# Patient Record
Sex: Male | Born: 1940
Health system: Southern US, Community
[De-identification: ages and names within clinical notes are randomized; demographics above are authoritative.]

## PROBLEM LIST (undated history)

## (undated) DIAGNOSIS — R001 Bradycardia, unspecified: Secondary | ICD-10-CM

## (undated) DIAGNOSIS — I252 Old myocardial infarction: Secondary | ICD-10-CM

## (undated) DIAGNOSIS — Z5111 Encounter for antineoplastic chemotherapy: Secondary | ICD-10-CM

## (undated) DIAGNOSIS — I2510 Atherosclerotic heart disease of native coronary artery without angina pectoris: Secondary | ICD-10-CM

## (undated) DIAGNOSIS — Z789 Other specified health status: Secondary | ICD-10-CM

## (undated) DIAGNOSIS — I255 Ischemic cardiomyopathy: Secondary | ICD-10-CM

## (undated) DIAGNOSIS — I10 Essential (primary) hypertension: Secondary | ICD-10-CM

## (undated) DIAGNOSIS — K219 Gastro-esophageal reflux disease without esophagitis: Secondary | ICD-10-CM

## (undated) DIAGNOSIS — C801 Malignant (primary) neoplasm, unspecified: Secondary | ICD-10-CM

## (undated) DIAGNOSIS — C8590 Non-Hodgkin lymphoma, unspecified, unspecified site: Secondary | ICD-10-CM

## (undated) DIAGNOSIS — I219 Acute myocardial infarction, unspecified: Secondary | ICD-10-CM

## (undated) DIAGNOSIS — E785 Hyperlipidemia, unspecified: Secondary | ICD-10-CM

## (undated) HISTORY — DX: Old myocardial infarction: I25.2

## (undated) HISTORY — DX: Bradycardia, unspecified: R00.1

## (undated) HISTORY — PX: CORONARY STENT PLACEMENT: SHX1402

## (undated) HISTORY — DX: Encounter for antineoplastic chemotherapy: Z51.11

## (undated) HISTORY — DX: Essential (primary) hypertension: I10

## (undated) HISTORY — DX: Gastro-esophageal reflux disease without esophagitis: K21.9

## (undated) HISTORY — DX: Other specified health status: Z78.9

## (undated) HISTORY — DX: Ischemic cardiomyopathy: I25.5

## (undated) HISTORY — DX: Atherosclerotic heart disease of native coronary artery without angina pectoris: I25.10

## (undated) HISTORY — DX: Hyperlipidemia, unspecified: E78.5

## (undated) HISTORY — DX: Acute myocardial infarction, unspecified: I21.9

---

## 1998-08-19 ENCOUNTER — Other Ambulatory Visit: Admission: RE | Admit: 1998-08-19 | Discharge: 1998-08-19 | Payer: Self-pay | Admitting: Urology

## 1998-09-21 ENCOUNTER — Emergency Department (HOSPITAL_COMMUNITY): Admission: EM | Admit: 1998-09-21 | Discharge: 1998-09-21 | Payer: Self-pay | Admitting: Emergency Medicine

## 1998-09-21 ENCOUNTER — Encounter: Payer: Self-pay | Admitting: *Deleted

## 1998-10-28 ENCOUNTER — Other Ambulatory Visit: Admission: RE | Admit: 1998-10-28 | Discharge: 1998-10-28 | Payer: Self-pay | Admitting: Specialist

## 1999-09-08 ENCOUNTER — Encounter: Admission: RE | Admit: 1999-09-08 | Discharge: 1999-09-22 | Payer: Self-pay | Admitting: Orthopedic Surgery

## 2000-03-29 ENCOUNTER — Encounter (INDEPENDENT_AMBULATORY_CARE_PROVIDER_SITE_OTHER): Payer: Self-pay | Admitting: Specialist

## 2000-03-29 ENCOUNTER — Other Ambulatory Visit: Admission: RE | Admit: 2000-03-29 | Discharge: 2000-03-29 | Payer: Self-pay | Admitting: Urology

## 2001-06-29 ENCOUNTER — Emergency Department (HOSPITAL_COMMUNITY): Admission: EM | Admit: 2001-06-29 | Discharge: 2001-06-29 | Payer: Self-pay | Admitting: Emergency Medicine

## 2001-06-30 ENCOUNTER — Encounter: Payer: Self-pay | Admitting: Emergency Medicine

## 2008-01-19 ENCOUNTER — Ambulatory Visit (HOSPITAL_COMMUNITY): Admission: RE | Admit: 2008-01-19 | Discharge: 2008-01-19 | Payer: Self-pay | Admitting: Surgery

## 2008-01-19 ENCOUNTER — Encounter (INDEPENDENT_AMBULATORY_CARE_PROVIDER_SITE_OTHER): Payer: Self-pay | Admitting: Surgery

## 2008-01-27 ENCOUNTER — Ambulatory Visit: Payer: Self-pay | Admitting: Internal Medicine

## 2008-02-04 LAB — CBC WITH DIFFERENTIAL/PLATELET
BASO%: 0.3 % (ref 0.0–2.0)
Basophils Absolute: 0 10*3/uL (ref 0.0–0.1)
EOS%: 0.7 % (ref 0.0–7.0)
HCT: 36.5 % — ABNORMAL LOW (ref 38.7–49.9)
HGB: 12.4 g/dL — ABNORMAL LOW (ref 13.0–17.1)
LYMPH%: 57.7 % — ABNORMAL HIGH (ref 14.0–48.0)
MCH: 29.8 pg (ref 28.0–33.4)
MCHC: 33.9 g/dL (ref 32.0–35.9)
NEUT%: 35.9 % — ABNORMAL LOW (ref 40.0–75.0)
Platelets: 164 10*3/uL (ref 145–400)

## 2008-02-04 LAB — LACTATE DEHYDROGENASE: LDH: 225 U/L (ref 94–250)

## 2008-02-04 LAB — COMPREHENSIVE METABOLIC PANEL
ALT: 17 U/L (ref 0–53)
AST: 26 U/L (ref 0–37)
BUN: 15 mg/dL (ref 6–23)
CO2: 25 mEq/L (ref 19–32)
Calcium: 9.1 mg/dL (ref 8.4–10.5)
Creatinine, Ser: 1.1 mg/dL (ref 0.40–1.50)
Total Bilirubin: 0.5 mg/dL (ref 0.3–1.2)

## 2008-02-06 ENCOUNTER — Ambulatory Visit (HOSPITAL_COMMUNITY): Admission: RE | Admit: 2008-02-06 | Discharge: 2008-02-06 | Payer: Self-pay | Admitting: Internal Medicine

## 2008-02-11 ENCOUNTER — Ambulatory Visit (HOSPITAL_COMMUNITY): Admission: RE | Admit: 2008-02-11 | Discharge: 2008-02-11 | Payer: Self-pay | Admitting: Internal Medicine

## 2008-02-11 ENCOUNTER — Encounter: Payer: Self-pay | Admitting: Internal Medicine

## 2008-02-11 ENCOUNTER — Ambulatory Visit: Payer: Self-pay | Admitting: Internal Medicine

## 2008-02-16 ENCOUNTER — Ambulatory Visit (HOSPITAL_COMMUNITY): Admission: RE | Admit: 2008-02-16 | Discharge: 2008-02-16 | Payer: Self-pay | Admitting: Internal Medicine

## 2008-02-18 LAB — COMPREHENSIVE METABOLIC PANEL
Alkaline Phosphatase: 138 U/L — ABNORMAL HIGH (ref 39–117)
BUN: 14 mg/dL (ref 6–23)
Creatinine, Ser: 1.07 mg/dL (ref 0.40–1.50)
Glucose, Bld: 82 mg/dL (ref 70–99)
Sodium: 142 mEq/L (ref 135–145)
Total Bilirubin: 0.5 mg/dL (ref 0.3–1.2)
Total Protein: 6.6 g/dL (ref 6.0–8.3)

## 2008-02-18 LAB — CBC WITH DIFFERENTIAL/PLATELET
Eosinophils Absolute: 0 10*3/uL (ref 0.0–0.5)
LYMPH%: 49.9 % — ABNORMAL HIGH (ref 14.0–48.0)
MCV: 87.5 fL (ref 81.6–98.0)
MONO%: 7.8 % (ref 0.0–13.0)
NEUT#: 2.2 10*3/uL (ref 1.5–6.5)
NEUT%: 41.4 % (ref 40.0–75.0)
Platelets: 180 10*3/uL (ref 145–400)
RBC: 4 10*6/uL — ABNORMAL LOW (ref 4.20–5.71)

## 2008-03-01 LAB — LACTATE DEHYDROGENASE: LDH: 216 U/L (ref 94–250)

## 2008-03-01 LAB — COMPREHENSIVE METABOLIC PANEL
ALT: 21 U/L (ref 0–53)
Albumin: 4 g/dL (ref 3.5–5.2)
CO2: 23 mEq/L (ref 19–32)
Calcium: 8.3 mg/dL — ABNORMAL LOW (ref 8.4–10.5)
Chloride: 109 mEq/L (ref 96–112)
Glucose, Bld: 132 mg/dL — ABNORMAL HIGH (ref 70–99)
Potassium: 3.8 mEq/L (ref 3.5–5.3)
Sodium: 142 mEq/L (ref 135–145)
Total Protein: 6.4 g/dL (ref 6.0–8.3)

## 2008-03-01 LAB — CBC WITH DIFFERENTIAL/PLATELET
BASO%: 1 % (ref 0.0–2.0)
EOS%: 0.6 % (ref 0.0–7.0)
HCT: 39.3 % (ref 38.7–49.9)
LYMPH%: 42.8 % (ref 14.0–48.0)
MCH: 29.1 pg (ref 28.0–33.4)
MCHC: 34.6 g/dL (ref 32.0–35.9)
NEUT%: 43.9 % (ref 40.0–75.0)
Platelets: 175 10*3/uL (ref 145–400)
RBC: 4.66 10*6/uL (ref 4.20–5.71)
WBC: 5.9 10*3/uL (ref 4.0–10.0)
lymph#: 2.5 10*3/uL (ref 0.9–3.3)

## 2008-03-01 LAB — URIC ACID: Uric Acid, Serum: 7.4 mg/dL (ref 4.0–7.8)

## 2008-03-08 LAB — CBC WITH DIFFERENTIAL/PLATELET
BASO%: 0.3 % (ref 0.0–2.0)
EOS%: 1.7 % (ref 0.0–7.0)
HCT: 39.3 % (ref 38.7–49.9)
MCH: 29.5 pg (ref 28.0–33.4)
MCHC: 34.6 g/dL (ref 32.0–35.9)
MCV: 85.2 fL (ref 81.6–98.0)
MONO%: 0.3 % (ref 0.0–13.0)
NEUT%: 62.9 % (ref 40.0–75.0)
RDW: 14.4 % (ref 11.2–14.6)
lymph#: 0.9 10*3/uL (ref 0.9–3.3)

## 2008-03-08 LAB — COMPREHENSIVE METABOLIC PANEL
ALT: 12 U/L (ref 0–53)
AST: 10 U/L (ref 0–37)
Alkaline Phosphatase: 87 U/L (ref 39–117)
Calcium: 9 mg/dL (ref 8.4–10.5)
Chloride: 99 mEq/L (ref 96–112)
Creatinine, Ser: 0.87 mg/dL (ref 0.40–1.50)
Total Bilirubin: 0.8 mg/dL (ref 0.3–1.2)

## 2008-03-15 ENCOUNTER — Ambulatory Visit: Payer: Self-pay | Admitting: Internal Medicine

## 2008-03-15 LAB — LACTATE DEHYDROGENASE: LDH: 144 U/L (ref 94–250)

## 2008-03-15 LAB — CBC WITH DIFFERENTIAL/PLATELET
Basophils Absolute: 0 10*3/uL (ref 0.0–0.1)
Eosinophils Absolute: 0 10*3/uL (ref 0.0–0.5)
HCT: 40.4 % (ref 38.7–49.9)
HGB: 13.9 g/dL (ref 13.0–17.1)
LYMPH%: 79.7 % — ABNORMAL HIGH (ref 14.0–48.0)
MCV: 84.5 fL (ref 81.6–98.0)
MONO#: 0.2 10*3/uL (ref 0.1–0.9)
MONO%: 13.2 % — ABNORMAL HIGH (ref 0.0–13.0)
NEUT#: 0.1 10*3/uL — CL (ref 1.5–6.5)
NEUT%: 5.1 % — ABNORMAL LOW (ref 40.0–75.0)
Platelets: 210 10*3/uL (ref 145–400)
WBC: 1.2 10*3/uL — ABNORMAL LOW (ref 4.0–10.0)

## 2008-03-15 LAB — COMPREHENSIVE METABOLIC PANEL
Alkaline Phosphatase: 112 U/L (ref 39–117)
BUN: 21 mg/dL (ref 6–23)
CO2: 25 mEq/L (ref 19–32)
Glucose, Bld: 90 mg/dL (ref 70–99)
Total Bilirubin: 0.7 mg/dL (ref 0.3–1.2)
Total Protein: 7.1 g/dL (ref 6.0–8.3)

## 2008-03-15 LAB — URIC ACID: Uric Acid, Serum: 3.6 mg/dL — ABNORMAL LOW (ref 4.0–7.8)

## 2008-03-16 LAB — BASIC METABOLIC PANEL
CO2: 26 mEq/L (ref 19–32)
Calcium: 9.5 mg/dL (ref 8.4–10.5)
Chloride: 97 mEq/L (ref 96–112)
Creatinine, Ser: 1.42 mg/dL (ref 0.40–1.50)
Sodium: 135 mEq/L (ref 135–145)

## 2008-03-22 LAB — CBC WITH DIFFERENTIAL/PLATELET
Basophils Absolute: 0 10*3/uL (ref 0.0–0.1)
Eosinophils Absolute: 0 10*3/uL (ref 0.0–0.5)
HCT: 34.6 % — ABNORMAL LOW (ref 38.7–49.9)
HGB: 12.1 g/dL — ABNORMAL LOW (ref 13.0–17.1)
MCH: 29.3 pg (ref 28.0–33.4)
NEUT#: 3.3 10*3/uL (ref 1.5–6.5)
NEUT%: 66.9 % (ref 40.0–75.0)
RDW: 14.5 % (ref 11.2–14.6)
lymph#: 1.1 10*3/uL (ref 0.9–3.3)

## 2008-03-22 LAB — COMPREHENSIVE METABOLIC PANEL
Albumin: 3.9 g/dL (ref 3.5–5.2)
BUN: 10 mg/dL (ref 6–23)
CO2: 23 mEq/L (ref 19–32)
Calcium: 9 mg/dL (ref 8.4–10.5)
Chloride: 105 mEq/L (ref 96–112)
Creatinine, Ser: 0.95 mg/dL (ref 0.40–1.50)
Glucose, Bld: 72 mg/dL (ref 70–99)
Potassium: 4.4 mEq/L (ref 3.5–5.3)

## 2008-04-05 LAB — CBC WITH DIFFERENTIAL/PLATELET
Eosinophils Absolute: 0 10*3/uL (ref 0.0–0.5)
HCT: 33.5 % — ABNORMAL LOW (ref 38.7–49.9)
LYMPH%: 12.1 % — ABNORMAL LOW (ref 14.0–48.0)
MCHC: 34.4 g/dL (ref 32.0–35.9)
MCV: 84.3 fL (ref 81.6–98.0)
MONO%: 6 % (ref 0.0–13.0)
NEUT#: 4.2 10*3/uL (ref 1.5–6.5)
NEUT%: 81.7 % — ABNORMAL HIGH (ref 40.0–75.0)
Platelets: 591 10*3/uL — ABNORMAL HIGH (ref 145–400)
RBC: 3.97 10*6/uL — ABNORMAL LOW (ref 4.20–5.71)

## 2008-04-05 LAB — COMPREHENSIVE METABOLIC PANEL
Alkaline Phosphatase: 226 U/L — ABNORMAL HIGH (ref 39–117)
Creatinine, Ser: 0.94 mg/dL (ref 0.40–1.50)
Glucose, Bld: 91 mg/dL (ref 70–99)
Sodium: 136 mEq/L (ref 135–145)
Total Bilirubin: 0.3 mg/dL (ref 0.3–1.2)
Total Protein: 6.2 g/dL (ref 6.0–8.3)

## 2008-04-05 LAB — LACTATE DEHYDROGENASE: LDH: 123 U/L (ref 94–250)

## 2008-04-05 LAB — URIC ACID: Uric Acid, Serum: 5.3 mg/dL (ref 4.0–7.8)

## 2008-04-12 LAB — LACTATE DEHYDROGENASE: LDH: 158 U/L (ref 94–250)

## 2008-04-12 LAB — CBC WITH DIFFERENTIAL/PLATELET
Basophils Absolute: 0.1 10*3/uL (ref 0.0–0.1)
Eosinophils Absolute: 0 10*3/uL (ref 0.0–0.5)
HCT: 34.6 % — ABNORMAL LOW (ref 38.7–49.9)
HGB: 11.8 g/dL — ABNORMAL LOW (ref 13.0–17.1)
MONO#: 0.8 10*3/uL (ref 0.1–0.9)
NEUT#: 6.3 10*3/uL (ref 1.5–6.5)
NEUT%: 76.2 % — ABNORMAL HIGH (ref 40.0–75.0)
RDW: 15 % — ABNORMAL HIGH (ref 11.2–14.6)
WBC: 8.3 10*3/uL (ref 4.0–10.0)
lymph#: 1.2 10*3/uL (ref 0.9–3.3)

## 2008-04-12 LAB — COMPREHENSIVE METABOLIC PANEL
ALT: 16 U/L (ref 0–53)
AST: 18 U/L (ref 0–37)
CO2: 25 mEq/L (ref 19–32)
Chloride: 101 mEq/L (ref 96–112)
Sodium: 137 mEq/L (ref 135–145)
Total Bilirubin: 0.2 mg/dL — ABNORMAL LOW (ref 0.3–1.2)
Total Protein: 6.1 g/dL (ref 6.0–8.3)

## 2008-04-19 LAB — CBC WITH DIFFERENTIAL/PLATELET
Basophils Absolute: 0 10*3/uL (ref 0.0–0.1)
EOS%: 3 % (ref 0.0–7.0)
Eosinophils Absolute: 0 10*3/uL (ref 0.0–0.5)
HGB: 10.2 g/dL — ABNORMAL LOW (ref 13.0–17.1)
LYMPH%: 56.2 % — ABNORMAL HIGH (ref 14.0–48.0)
MCH: 29.1 pg (ref 28.0–33.4)
MCV: 85.1 fL (ref 81.6–98.0)
MONO%: 9.3 % (ref 0.0–13.0)
NEUT%: 31 % — ABNORMAL LOW (ref 40.0–75.0)
Platelets: 319 10*3/uL (ref 145–400)
RDW: 14.8 % — ABNORMAL HIGH (ref 11.2–14.6)

## 2008-04-19 LAB — COMPREHENSIVE METABOLIC PANEL
AST: 12 U/L (ref 0–37)
Alkaline Phosphatase: 131 U/L — ABNORMAL HIGH (ref 39–117)
BUN: 18 mg/dL (ref 6–23)
Creatinine, Ser: 0.74 mg/dL (ref 0.40–1.50)
Glucose, Bld: 123 mg/dL — ABNORMAL HIGH (ref 70–99)
Total Bilirubin: 0.6 mg/dL (ref 0.3–1.2)

## 2008-04-26 LAB — CBC WITH DIFFERENTIAL/PLATELET
BASO%: 0.3 % (ref 0.0–2.0)
HCT: 29.6 % — ABNORMAL LOW (ref 38.4–49.9)
LYMPH%: 11.4 % — ABNORMAL LOW (ref 14.0–49.0)
MCHC: 34.4 g/dL (ref 32.0–36.0)
MONO#: 0.5 10*3/uL (ref 0.1–0.9)
NEUT%: 80.5 % — ABNORMAL HIGH (ref 39.0–75.0)
Platelets: 307 10*3/uL (ref 140–400)
WBC: 6 10*3/uL (ref 4.0–10.3)

## 2008-04-26 LAB — COMPREHENSIVE METABOLIC PANEL
ALT: 10 U/L (ref 0–53)
CO2: 27 mEq/L (ref 19–32)
Creatinine, Ser: 0.83 mg/dL (ref 0.40–1.50)
Glucose, Bld: 85 mg/dL (ref 70–99)
Total Bilirubin: 0.3 mg/dL (ref 0.3–1.2)

## 2008-04-26 LAB — URIC ACID: Uric Acid, Serum: 3.6 mg/dL — ABNORMAL LOW (ref 4.0–7.8)

## 2008-04-26 LAB — LACTATE DEHYDROGENASE: LDH: 135 U/L (ref 94–250)

## 2008-04-28 ENCOUNTER — Ambulatory Visit (HOSPITAL_COMMUNITY): Admission: RE | Admit: 2008-04-28 | Discharge: 2008-04-28 | Payer: Self-pay | Admitting: Internal Medicine

## 2008-05-03 ENCOUNTER — Ambulatory Visit: Payer: Self-pay | Admitting: Internal Medicine

## 2008-05-03 LAB — CBC WITH DIFFERENTIAL/PLATELET
Basophils Absolute: 0 10*3/uL (ref 0.0–0.1)
EOS%: 0.1 % (ref 0.0–7.0)
HCT: 31.9 % — ABNORMAL LOW (ref 38.4–49.9)
HGB: 10.8 g/dL — ABNORMAL LOW (ref 13.0–17.1)
LYMPH%: 11.9 % — ABNORMAL LOW (ref 14.0–49.0)
MCH: 29.2 pg (ref 27.2–33.4)
MCV: 86 fL (ref 79.3–98.0)
MONO%: 6.3 % (ref 0.0–14.0)
NEUT%: 81.1 % — ABNORMAL HIGH (ref 39.0–75.0)
Platelets: 621 10*3/uL — ABNORMAL HIGH (ref 140–400)
RDW: 16.6 % — ABNORMAL HIGH (ref 11.0–14.6)

## 2008-05-03 LAB — COMPREHENSIVE METABOLIC PANEL
ALT: 18 U/L (ref 0–53)
AST: 18 U/L (ref 0–37)
BUN: 13 mg/dL (ref 6–23)
Calcium: 9 mg/dL (ref 8.4–10.5)
Creatinine, Ser: 0.8 mg/dL (ref 0.40–1.50)
Total Bilirubin: 0.2 mg/dL — ABNORMAL LOW (ref 0.3–1.2)

## 2008-05-10 LAB — CBC WITH DIFFERENTIAL/PLATELET
BASO%: 0.1 % (ref 0.0–2.0)
HCT: 29.5 % — ABNORMAL LOW (ref 38.4–49.9)
HGB: 9.9 g/dL — ABNORMAL LOW (ref 13.0–17.1)
LYMPH%: 9.5 % — ABNORMAL LOW (ref 14.0–49.0)
MCH: 29 pg (ref 27.2–33.4)
MCV: 86.4 fL (ref 79.3–98.0)
MONO#: 0 10*3/uL — ABNORMAL LOW (ref 0.1–0.9)
NEUT#: 4.6 10*3/uL (ref 1.5–6.5)
RDW: 17.6 % — ABNORMAL HIGH (ref 11.0–14.6)
lymph#: 0.5 10*3/uL — ABNORMAL LOW (ref 0.9–3.3)

## 2008-05-10 LAB — COMPREHENSIVE METABOLIC PANEL
ALT: 16 U/L (ref 0–53)
AST: 8 U/L (ref 0–37)
Alkaline Phosphatase: 75 U/L (ref 39–117)
CO2: 24 mEq/L (ref 19–32)
Sodium: 139 mEq/L (ref 135–145)
Total Bilirubin: 0.4 mg/dL (ref 0.3–1.2)
Total Protein: 5.3 g/dL — ABNORMAL LOW (ref 6.0–8.3)

## 2008-05-17 LAB — CBC WITH DIFFERENTIAL/PLATELET
Basophils Absolute: 0 10*3/uL (ref 0.0–0.1)
Eosinophils Absolute: 0 10*3/uL (ref 0.0–0.5)
HGB: 10.9 g/dL — ABNORMAL LOW (ref 13.0–17.1)
MCV: 87.6 fL (ref 79.3–98.0)
MONO#: 0.6 10*3/uL (ref 0.1–0.9)
MONO%: 36.4 % — ABNORMAL HIGH (ref 0.0–14.0)
NEUT#: 0.5 10*3/uL — ABNORMAL LOW (ref 1.5–6.5)
RDW: 18.5 % — ABNORMAL HIGH (ref 11.0–14.6)

## 2008-05-17 LAB — COMPREHENSIVE METABOLIC PANEL
Albumin: 3.6 g/dL (ref 3.5–5.2)
BUN: 12 mg/dL (ref 6–23)
CO2: 27 mEq/L (ref 19–32)
Calcium: 9.1 mg/dL (ref 8.4–10.5)
Chloride: 103 mEq/L (ref 96–112)
Glucose, Bld: 77 mg/dL (ref 70–99)
Potassium: 4.5 mEq/L (ref 3.5–5.3)

## 2008-05-17 LAB — LACTATE DEHYDROGENASE: LDH: 143 U/L (ref 94–250)

## 2008-05-17 LAB — URIC ACID: Uric Acid, Serum: 5.5 mg/dL (ref 4.0–7.8)

## 2008-05-24 LAB — COMPREHENSIVE METABOLIC PANEL
ALT: 12 U/L (ref 0–53)
Alkaline Phosphatase: 99 U/L (ref 39–117)
Sodium: 137 mEq/L (ref 135–145)
Total Bilirubin: 0.4 mg/dL (ref 0.3–1.2)
Total Protein: 6.6 g/dL (ref 6.0–8.3)

## 2008-05-24 LAB — CBC WITH DIFFERENTIAL/PLATELET
Eosinophils Absolute: 0.1 10*3/uL (ref 0.0–0.5)
MONO#: 1.1 10*3/uL — ABNORMAL HIGH (ref 0.1–0.9)
NEUT#: 2.4 10*3/uL (ref 1.5–6.5)
RBC: 4.41 10*6/uL (ref 4.20–5.82)
RDW: 17 % — ABNORMAL HIGH (ref 11.0–14.6)
WBC: 4.8 10*3/uL (ref 4.0–10.3)
nRBC: 0 % (ref 0–0)

## 2008-05-24 LAB — LACTATE DEHYDROGENASE: LDH: 180 U/L (ref 94–250)

## 2008-05-31 LAB — CBC WITH DIFFERENTIAL/PLATELET
BASO%: 0.2 % (ref 0.0–2.0)
Basophils Absolute: 0 10*3/uL (ref 0.0–0.1)
HCT: 31.4 % — ABNORMAL LOW (ref 38.4–49.9)
HGB: 10.5 g/dL — ABNORMAL LOW (ref 13.0–17.1)
MCHC: 33.4 g/dL (ref 32.0–36.0)
MONO#: 0.1 10*3/uL (ref 0.1–0.9)
NEUT%: 80.5 % — ABNORMAL HIGH (ref 39.0–75.0)
WBC: 4.7 10*3/uL (ref 4.0–10.3)
lymph#: 0.8 10*3/uL — ABNORMAL LOW (ref 0.9–3.3)

## 2008-05-31 LAB — COMPREHENSIVE METABOLIC PANEL
AST: 12 U/L (ref 0–37)
Albumin: 3.7 g/dL (ref 3.5–5.2)
BUN: 15 mg/dL (ref 6–23)
Chloride: 99 mEq/L (ref 96–112)
Potassium: 3.7 mEq/L (ref 3.5–5.3)
Total Protein: 5.9 g/dL — ABNORMAL LOW (ref 6.0–8.3)

## 2008-06-07 LAB — CBC WITH DIFFERENTIAL/PLATELET
Basophils Absolute: 0 10*3/uL (ref 0.0–0.1)
EOS%: 0.6 % (ref 0.0–7.0)
HCT: 29.8 % — ABNORMAL LOW (ref 38.4–49.9)
HGB: 10 g/dL — ABNORMAL LOW (ref 13.0–17.1)
LYMPH%: 36.6 % (ref 14.0–49.0)
MCH: 28.6 pg (ref 27.2–33.4)
MCV: 85.1 fL (ref 79.3–98.0)
MONO%: 48.4 % — ABNORMAL HIGH (ref 0.0–14.0)
NEUT%: 13.8 % — ABNORMAL LOW (ref 39.0–75.0)

## 2008-06-07 LAB — COMPREHENSIVE METABOLIC PANEL
Alkaline Phosphatase: 89 U/L (ref 39–117)
BUN: 10 mg/dL (ref 6–23)
Glucose, Bld: 81 mg/dL (ref 70–99)
Total Bilirubin: 0.3 mg/dL (ref 0.3–1.2)

## 2008-06-14 LAB — CBC WITH DIFFERENTIAL/PLATELET
BASO%: 0.3 % (ref 0.0–2.0)
LYMPH%: 32 % (ref 14.0–49.0)
MCHC: 32.6 g/dL (ref 32.0–36.0)
MONO#: 1.2 10*3/uL — ABNORMAL HIGH (ref 0.1–0.9)
RBC: 3.67 10*6/uL — ABNORMAL LOW (ref 4.20–5.82)
RDW: 17.3 % — ABNORMAL HIGH (ref 11.0–14.6)
WBC: 6.1 10*3/uL (ref 4.0–10.3)
lymph#: 2 10*3/uL (ref 0.9–3.3)

## 2008-06-14 LAB — COMPREHENSIVE METABOLIC PANEL
Albumin: 3.6 g/dL (ref 3.5–5.2)
Alkaline Phosphatase: 102 U/L (ref 39–117)
CO2: 27 mEq/L (ref 19–32)
Glucose, Bld: 109 mg/dL — ABNORMAL HIGH (ref 70–99)
Potassium: 4.4 mEq/L (ref 3.5–5.3)
Sodium: 139 mEq/L (ref 135–145)
Total Protein: 5.9 g/dL — ABNORMAL LOW (ref 6.0–8.3)

## 2008-06-14 LAB — LACTATE DEHYDROGENASE: LDH: 200 U/L (ref 94–250)

## 2008-06-14 LAB — URIC ACID: Uric Acid, Serum: 7.1 mg/dL (ref 4.0–7.8)

## 2008-06-17 ENCOUNTER — Ambulatory Visit: Payer: Self-pay | Admitting: Internal Medicine

## 2008-06-21 LAB — CBC WITH DIFFERENTIAL/PLATELET
BASO%: 1 % (ref 0.0–2.0)
HCT: 27.4 % — ABNORMAL LOW (ref 38.4–49.9)
HGB: 9.4 g/dL — ABNORMAL LOW (ref 13.0–17.1)
MCHC: 34.1 g/dL (ref 32.0–36.0)
MONO#: 0 10*3/uL — ABNORMAL LOW (ref 0.1–0.9)
NEUT%: 48 % (ref 39.0–75.0)
WBC: 0.9 10*3/uL — CL (ref 4.0–10.3)
lymph#: 0.4 10*3/uL — ABNORMAL LOW (ref 0.9–3.3)

## 2008-06-21 LAB — COMPREHENSIVE METABOLIC PANEL
ALT: 14 U/L (ref 0–53)
Albumin: 3.3 g/dL — ABNORMAL LOW (ref 3.5–5.2)
CO2: 28 mEq/L (ref 19–32)
Calcium: 8.3 mg/dL — ABNORMAL LOW (ref 8.4–10.5)
Chloride: 102 mEq/L (ref 96–112)
Creatinine, Ser: 0.81 mg/dL (ref 0.40–1.50)
Total Protein: 5.3 g/dL — ABNORMAL LOW (ref 6.0–8.3)

## 2008-06-21 LAB — URIC ACID: Uric Acid, Serum: 3.8 mg/dL — ABNORMAL LOW (ref 4.0–7.8)

## 2008-06-21 LAB — LACTATE DEHYDROGENASE: LDH: 131 U/L (ref 94–250)

## 2008-06-28 ENCOUNTER — Ambulatory Visit (HOSPITAL_COMMUNITY): Admission: RE | Admit: 2008-06-28 | Discharge: 2008-06-28 | Payer: Self-pay | Admitting: Internal Medicine

## 2008-06-28 LAB — LACTATE DEHYDROGENASE: LDH: 136 U/L (ref 94–250)

## 2008-06-28 LAB — COMPREHENSIVE METABOLIC PANEL
ALT: 11 U/L (ref 0–53)
AST: 11 U/L (ref 0–37)
CO2: 23 mEq/L (ref 19–32)
Sodium: 136 mEq/L (ref 135–145)
Total Bilirubin: 0.3 mg/dL (ref 0.3–1.2)
Total Protein: 6.1 g/dL (ref 6.0–8.3)

## 2008-06-28 LAB — CBC WITH DIFFERENTIAL/PLATELET
BASO%: 0.4 % (ref 0.0–2.0)
LYMPH%: 16.3 % (ref 14.0–49.0)
MCHC: 33.5 g/dL (ref 32.0–36.0)
MONO#: 0.4 10*3/uL (ref 0.1–0.9)
Platelets: 482 10*3/uL — ABNORMAL HIGH (ref 140–400)
RBC: 3.39 10*6/uL — ABNORMAL LOW (ref 4.20–5.82)
WBC: 5.7 10*3/uL (ref 4.0–10.3)
lymph#: 0.9 10*3/uL (ref 0.9–3.3)

## 2008-07-12 LAB — CBC WITH DIFFERENTIAL/PLATELET
Basophils Absolute: 0 10*3/uL (ref 0.0–0.1)
Eosinophils Absolute: 0.1 10*3/uL (ref 0.0–0.5)
HCT: 27.9 % — ABNORMAL LOW (ref 38.4–49.9)
HGB: 9.4 g/dL — ABNORMAL LOW (ref 13.0–17.1)
MCH: 29.1 pg (ref 27.2–33.4)
MCV: 86.4 fL (ref 79.3–98.0)
MONO%: 13.8 % (ref 0.0–14.0)
NEUT#: 0.5 10*3/uL — ABNORMAL LOW (ref 1.5–6.5)
NEUT%: 36.8 % — ABNORMAL LOW (ref 39.0–75.0)
RDW: 19.4 % — ABNORMAL HIGH (ref 11.0–14.6)

## 2008-07-12 LAB — COMPREHENSIVE METABOLIC PANEL
Albumin: 3.4 g/dL — ABNORMAL LOW (ref 3.5–5.2)
BUN: 13 mg/dL (ref 6–23)
CO2: 28 mEq/L (ref 19–32)
Glucose, Bld: 77 mg/dL (ref 70–99)
Potassium: 4 mEq/L (ref 3.5–5.3)
Sodium: 140 mEq/L (ref 135–145)
Total Bilirubin: 0.3 mg/dL (ref 0.3–1.2)
Total Protein: 5.5 g/dL — ABNORMAL LOW (ref 6.0–8.3)

## 2008-07-19 LAB — COMPREHENSIVE METABOLIC PANEL
Albumin: 3.3 g/dL — ABNORMAL LOW (ref 3.5–5.2)
Alkaline Phosphatase: 98 U/L (ref 39–117)
CO2: 26 mEq/L (ref 19–32)
Calcium: 9.1 mg/dL (ref 8.4–10.5)
Chloride: 104 mEq/L (ref 96–112)
Glucose, Bld: 85 mg/dL (ref 70–99)
Potassium: 4.3 mEq/L (ref 3.5–5.3)
Sodium: 141 mEq/L (ref 135–145)
Total Protein: 5.8 g/dL — ABNORMAL LOW (ref 6.0–8.3)

## 2008-07-19 LAB — CBC WITH DIFFERENTIAL/PLATELET
Eosinophils Absolute: 0 10*3/uL (ref 0.0–0.5)
HGB: 10 g/dL — ABNORMAL LOW (ref 13.0–17.1)
MONO#: 0.2 10*3/uL (ref 0.1–0.9)
NEUT#: 3 10*3/uL (ref 1.5–6.5)
RBC: 3.43 10*6/uL — ABNORMAL LOW (ref 4.20–5.82)
RDW: 19.4 % — ABNORMAL HIGH (ref 11.0–14.6)
WBC: 4 10*3/uL (ref 4.0–10.3)
lymph#: 0.8 10*3/uL — ABNORMAL LOW (ref 0.9–3.3)

## 2008-07-19 LAB — LACTATE DEHYDROGENASE: LDH: 132 U/L (ref 94–250)

## 2008-07-26 LAB — CBC WITH DIFFERENTIAL/PLATELET
BASO%: 0.6 % (ref 0.0–2.0)
LYMPH%: 16.6 % (ref 14.0–49.0)
MCHC: 32.6 g/dL (ref 32.0–36.0)
MONO#: 0.7 10*3/uL (ref 0.1–0.9)
Platelets: 481 10*3/uL — ABNORMAL HIGH (ref 140–400)
RBC: 3.97 10*6/uL — ABNORMAL LOW (ref 4.20–5.82)
WBC: 6.2 10*3/uL (ref 4.0–10.3)

## 2008-07-26 LAB — COMPREHENSIVE METABOLIC PANEL
ALT: <8 U/L (ref 0–53)
Alkaline Phosphatase: 99 U/L (ref 39–117)
CO2: 26 mEq/L (ref 19–32)
Sodium: 139 mEq/L (ref 135–145)
Total Bilirubin: 0.3 mg/dL (ref 0.3–1.2)
Total Protein: 6.6 g/dL (ref 6.0–8.3)

## 2008-07-30 ENCOUNTER — Ambulatory Visit: Payer: Self-pay | Admitting: Internal Medicine

## 2008-08-04 LAB — CBC WITH DIFFERENTIAL/PLATELET
BASO%: 1.1 % (ref 0.0–2.0)
Basophils Absolute: 0 10*3/uL (ref 0.0–0.1)
EOS%: 0.8 % (ref 0.0–7.0)
HCT: 32 % — ABNORMAL LOW (ref 38.4–49.9)
HGB: 10.5 g/dL — ABNORMAL LOW (ref 13.0–17.1)
LYMPH%: 23.4 % (ref 14.0–49.0)
MCH: 28.5 pg (ref 27.2–33.4)
MCHC: 32.8 g/dL (ref 32.0–36.0)
MCV: 86.7 fL (ref 79.3–98.0)
MONO%: 20.9 % — ABNORMAL HIGH (ref 0.0–14.0)
NEUT%: 53.8 % (ref 39.0–75.0)
Platelets: 189 10*3/uL (ref 140–400)

## 2008-08-04 LAB — COMPREHENSIVE METABOLIC PANEL
ALT: <8 U/L (ref 0–53)
AST: 12 U/L (ref 0–37)
Alkaline Phosphatase: 75 U/L (ref 39–117)
BUN: 10 mg/dL (ref 6–23)
Calcium: 9 mg/dL (ref 8.4–10.5)
Chloride: 107 mEq/L (ref 96–112)
Creatinine, Ser: 0.82 mg/dL (ref 0.40–1.50)
Total Bilirubin: 0.3 mg/dL (ref 0.3–1.2)

## 2008-09-24 ENCOUNTER — Ambulatory Visit: Payer: Self-pay | Admitting: Internal Medicine

## 2008-09-28 ENCOUNTER — Ambulatory Visit (HOSPITAL_COMMUNITY): Admission: RE | Admit: 2008-09-28 | Discharge: 2008-09-28 | Payer: Self-pay | Admitting: Internal Medicine

## 2008-09-28 LAB — LACTATE DEHYDROGENASE: LDH: 129 U/L (ref 94–250)

## 2008-09-28 LAB — CBC WITH DIFFERENTIAL/PLATELET
BASO%: 1.1 % (ref 0.0–2.0)
EOS%: 2.3 % (ref 0.0–7.0)
LYMPH%: 39.4 % (ref 14.0–49.0)
MCH: 29.2 pg (ref 27.2–33.4)
MCHC: 34 g/dL (ref 32.0–36.0)
MONO#: 0.6 10*3/uL (ref 0.1–0.9)
NEUT%: 41.4 % (ref 39.0–75.0)
RBC: 4.39 10*6/uL (ref 4.20–5.82)
WBC: 3.6 10*3/uL — ABNORMAL LOW (ref 4.0–10.3)
lymph#: 1.4 10*3/uL (ref 0.9–3.3)

## 2008-09-28 LAB — COMPREHENSIVE METABOLIC PANEL
ALT: 12 U/L (ref 0–53)
AST: 20 U/L (ref 0–37)
CO2: 29 mEq/L (ref 19–32)
Creatinine, Ser: 0.77 mg/dL (ref 0.40–1.50)
Sodium: 140 mEq/L (ref 135–145)
Total Bilirubin: 0.8 mg/dL (ref 0.3–1.2)
Total Protein: 6.1 g/dL (ref 6.0–8.3)

## 2008-12-27 ENCOUNTER — Ambulatory Visit: Payer: Self-pay | Admitting: Internal Medicine

## 2008-12-29 ENCOUNTER — Ambulatory Visit (HOSPITAL_COMMUNITY): Admission: RE | Admit: 2008-12-29 | Discharge: 2008-12-29 | Payer: Self-pay | Admitting: Internal Medicine

## 2008-12-29 LAB — CBC WITH DIFFERENTIAL/PLATELET
Basophils Absolute: 0 10*3/uL (ref 0.0–0.1)
EOS%: 3.9 % (ref 0.0–7.0)
HCT: 39 % (ref 38.4–49.9)
HGB: 13.2 g/dL (ref 13.0–17.1)
LYMPH%: 34.2 % (ref 14.0–49.0)
MCH: 31 pg (ref 27.2–33.4)
MCHC: 33.8 g/dL (ref 32.0–36.0)
MCV: 91.5 fL (ref 79.3–98.0)
MONO%: 12.6 % (ref 0.0–14.0)
NEUT%: 48.6 % (ref 39.0–75.0)

## 2008-12-29 LAB — COMPREHENSIVE METABOLIC PANEL
ALT: 13 U/L (ref 0–53)
Albumin: 3.5 g/dL (ref 3.5–5.2)
CO2: 27 mEq/L (ref 19–32)
Glucose, Bld: 84 mg/dL (ref 70–99)
Total Protein: 6 g/dL (ref 6.0–8.3)

## 2008-12-29 LAB — LACTATE DEHYDROGENASE: LDH: 124 U/L (ref 94–250)

## 2009-03-03 ENCOUNTER — Ambulatory Visit: Payer: Self-pay | Admitting: Internal Medicine

## 2009-03-29 LAB — COMPREHENSIVE METABOLIC PANEL
Alkaline Phosphatase: 100 U/L (ref 39–117)
Calcium: 9.8 mg/dL (ref 8.4–10.5)
Chloride: 103 mEq/L (ref 96–112)
Glucose, Bld: 82 mg/dL (ref 70–99)
Sodium: 141 mEq/L (ref 135–145)
Total Bilirubin: 0.5 mg/dL (ref 0.3–1.2)
Total Protein: 6.7 g/dL (ref 6.0–8.3)

## 2009-03-29 LAB — CBC WITH DIFFERENTIAL/PLATELET
Basophils Absolute: 0 10*3/uL (ref 0.0–0.1)
LYMPH%: 33.3 % (ref 14.0–49.0)
MCH: 31.5 pg (ref 27.2–33.4)
MCHC: 34.1 g/dL (ref 32.0–36.0)
MCV: 92.4 fL (ref 79.3–98.0)
MONO%: 9.5 % (ref 0.0–14.0)
NEUT#: 3 10*3/uL (ref 1.5–6.5)
NEUT%: 55 % (ref 39.0–75.0)
Platelets: 261 10*3/uL (ref 140–400)
lymph#: 1.8 10*3/uL (ref 0.9–3.3)

## 2009-04-08 ENCOUNTER — Ambulatory Visit: Payer: Self-pay | Admitting: Internal Medicine

## 2009-04-12 LAB — CBC WITH DIFFERENTIAL/PLATELET
BASO%: 1.7 % (ref 0.0–2.0)
Basophils Absolute: 0.1 10*3/uL (ref 0.0–0.1)
Eosinophils Absolute: 0.2 10*3/uL (ref 0.0–0.5)
HCT: 43.2 % (ref 38.4–49.9)
HGB: 15 g/dL (ref 13.0–17.1)
MONO#: 0.6 10*3/uL (ref 0.1–0.9)
NEUT#: 2.1 10*3/uL (ref 1.5–6.5)
NEUT%: 42.6 % (ref 39.0–75.0)
WBC: 4.8 10*3/uL (ref 4.0–10.3)
lymph#: 1.9 10*3/uL (ref 0.9–3.3)

## 2009-04-12 LAB — COMPREHENSIVE METABOLIC PANEL
Alkaline Phosphatase: 80 U/L (ref 39–117)
BUN: 15 mg/dL (ref 6–23)
Glucose, Bld: 94 mg/dL (ref 70–99)
Total Bilirubin: 0.4 mg/dL (ref 0.3–1.2)

## 2009-04-12 LAB — URIC ACID: Uric Acid, Serum: 6 mg/dL (ref 4.0–7.8)

## 2009-07-12 ENCOUNTER — Ambulatory Visit: Payer: Self-pay | Admitting: Internal Medicine

## 2009-07-13 LAB — COMPREHENSIVE METABOLIC PANEL
ALT: 16 U/L (ref 0–53)
Albumin: 4.2 g/dL (ref 3.5–5.2)
CO2: 22 mEq/L (ref 19–32)
Chloride: 103 mEq/L (ref 96–112)
Glucose, Bld: 81 mg/dL (ref 70–99)
Potassium: 4.6 mEq/L (ref 3.5–5.3)
Sodium: 139 mEq/L (ref 135–145)
Total Bilirubin: 0.5 mg/dL (ref 0.3–1.2)
Total Protein: 6.2 g/dL (ref 6.0–8.3)

## 2009-07-13 LAB — CBC WITH DIFFERENTIAL/PLATELET
BASO%: 1 % (ref 0.0–2.0)
Basophils Absolute: 0.1 10*3/uL (ref 0.0–0.1)
EOS%: 8.8 % — ABNORMAL HIGH (ref 0.0–7.0)
HCT: 42.4 % (ref 38.4–49.9)
HGB: 14.7 g/dL (ref 13.0–17.1)
MCH: 31 pg (ref 27.2–33.4)
MCHC: 34.7 g/dL (ref 32.0–36.0)
MCV: 89.5 fL (ref 79.3–98.0)
MONO%: 12 % (ref 0.0–14.0)
NEUT%: 34.5 % — ABNORMAL LOW (ref 39.0–75.0)
RDW: 13.2 % (ref 11.0–14.6)

## 2009-07-13 LAB — LACTATE DEHYDROGENASE: LDH: 195 U/L (ref 94–250)

## 2009-08-29 ENCOUNTER — Ambulatory Visit (HOSPITAL_COMMUNITY): Admission: RE | Admit: 2009-08-29 | Discharge: 2009-08-29 | Payer: Self-pay | Admitting: Internal Medicine

## 2009-09-02 ENCOUNTER — Ambulatory Visit: Payer: Self-pay | Admitting: Internal Medicine

## 2009-09-07 LAB — COMPREHENSIVE METABOLIC PANEL
Alkaline Phosphatase: 79 U/L (ref 39–117)
BUN: 18 mg/dL (ref 6–23)
CO2: 24 mEq/L (ref 19–32)
Creatinine, Ser: 0.99 mg/dL (ref 0.40–1.50)
Glucose, Bld: 79 mg/dL (ref 70–99)
Total Bilirubin: 0.5 mg/dL (ref 0.3–1.2)
Total Protein: 6.5 g/dL (ref 6.0–8.3)

## 2009-09-07 LAB — CBC WITH DIFFERENTIAL/PLATELET
BASO%: 0.5 % (ref 0.0–2.0)
Basophils Absolute: 0 10*3/uL (ref 0.0–0.1)
EOS%: 2.1 % (ref 0.0–7.0)
HCT: 43 % (ref 38.4–49.9)
HGB: 14.5 g/dL (ref 13.0–17.1)
MCH: 31.1 pg (ref 27.2–33.4)
MCHC: 33.8 g/dL (ref 32.0–36.0)
MONO#: 0.9 10*3/uL (ref 0.1–0.9)
RDW: 13.6 % (ref 11.0–14.6)
WBC: 6.8 10*3/uL (ref 4.0–10.3)
lymph#: 3.1 10*3/uL (ref 0.9–3.3)

## 2009-09-07 LAB — LACTATE DEHYDROGENASE: LDH: 135 U/L (ref 94–250)

## 2009-11-03 ENCOUNTER — Ambulatory Visit: Payer: Self-pay | Admitting: Internal Medicine

## 2009-11-08 LAB — CBC WITH DIFFERENTIAL/PLATELET
Basophils Absolute: 0 10*3/uL (ref 0.0–0.1)
Eosinophils Absolute: 0.1 10*3/uL (ref 0.0–0.5)
HCT: 42.7 % (ref 38.4–49.9)
HGB: 14.4 g/dL (ref 13.0–17.1)
LYMPH%: 33.2 % (ref 14.0–49.0)
MCV: 91.4 fL (ref 79.3–98.0)
MONO#: 0.5 10*3/uL (ref 0.1–0.9)
MONO%: 12 % (ref 0.0–14.0)
NEUT#: 2.1 10*3/uL (ref 1.5–6.5)
NEUT%: 51.2 % (ref 39.0–75.0)
Platelets: 229 10*3/uL (ref 140–400)
RBC: 4.67 10*6/uL (ref 4.20–5.82)
WBC: 4.2 10*3/uL (ref 4.0–10.3)
nRBC: 0 % (ref 0–0)

## 2009-11-08 LAB — COMPREHENSIVE METABOLIC PANEL
ALT: 19 U/L (ref 0–53)
BUN: 15 mg/dL (ref 6–23)
CO2: 25 mEq/L (ref 19–32)
Calcium: 9.3 mg/dL (ref 8.4–10.5)
Chloride: 103 mEq/L (ref 96–112)
Creatinine, Ser: 1.06 mg/dL (ref 0.40–1.50)
Glucose, Bld: 83 mg/dL (ref 70–99)
Total Bilirubin: 0.5 mg/dL (ref 0.3–1.2)

## 2009-11-08 LAB — LACTATE DEHYDROGENASE: LDH: 164 U/L (ref 94–250)

## 2010-01-05 ENCOUNTER — Ambulatory Visit: Payer: Self-pay | Admitting: Internal Medicine

## 2010-03-21 ENCOUNTER — Ambulatory Visit: Payer: Self-pay | Admitting: Internal Medicine

## 2010-03-23 ENCOUNTER — Ambulatory Visit (HOSPITAL_COMMUNITY)
Admission: RE | Admit: 2010-03-23 | Discharge: 2010-03-23 | Payer: Self-pay | Source: Home / Self Care | Attending: Internal Medicine | Admitting: Internal Medicine

## 2010-03-23 LAB — CBC WITH DIFFERENTIAL/PLATELET
BASO%: 0.4 % (ref 0.0–2.0)
Basophils Absolute: 0 10*3/uL (ref 0.0–0.1)
EOS%: 1.4 % (ref 0.0–7.0)
Eosinophils Absolute: 0.1 10*3/uL (ref 0.0–0.5)
HCT: 45.9 % (ref 38.4–49.9)
HGB: 15.5 g/dL (ref 13.0–17.1)
LYMPH%: 38 % (ref 14.0–49.0)
MCH: 30.3 pg (ref 27.2–33.4)
MCHC: 33.8 g/dL (ref 32.0–36.0)
MCV: 89.6 fL (ref 79.3–98.0)
MONO#: 0.8 10*3/uL (ref 0.1–0.9)
MONO%: 11.2 % (ref 0.0–14.0)
NEUT#: 3.5 10*3/uL (ref 1.5–6.5)
NEUT%: 49 % (ref 39.0–75.0)
Platelets: 235 10*3/uL (ref 140–400)
RBC: 5.12 10*6/uL (ref 4.20–5.82)
RDW: 13.2 % (ref 11.0–14.6)
WBC: 7.2 10*3/uL (ref 4.0–10.3)
lymph#: 2.7 10*3/uL (ref 0.9–3.3)

## 2010-03-23 LAB — CMP (CANCER CENTER ONLY)
ALT(SGPT): 20 U/L (ref 10–47)
AST: 25 U/L (ref 11–38)
Albumin: 3.8 g/dL (ref 3.3–5.5)
Alkaline Phosphatase: 78 U/L (ref 26–84)
BUN, Bld: 12 mg/dL (ref 7–22)
CO2: 26 mEq/L (ref 18–33)
Calcium: 9.1 mg/dL (ref 8.0–10.3)
Chloride: 99 mEq/L (ref 98–108)
Creat: 1 mg/dl (ref 0.6–1.2)
Glucose, Bld: 82 mg/dL (ref 73–118)
Potassium: 4.7 mEq/L (ref 3.3–4.7)
Sodium: 141 mEq/L (ref 128–145)
Total Bilirubin: 0.6 mg/dl (ref 0.20–1.60)
Total Protein: 6.8 g/dL (ref 6.4–8.1)

## 2010-03-23 LAB — LACTATE DEHYDROGENASE: LDH: 147 U/L (ref 94–250)

## 2010-03-27 LAB — CBC WITH DIFFERENTIAL/PLATELET
EOS%: 3.6 % (ref 0.0–7.0)
Eosinophils Absolute: 0.2 10*3/uL (ref 0.0–0.5)
HCT: 43.8 % (ref 38.4–49.9)
HGB: 15.1 g/dL (ref 13.0–17.1)
LYMPH%: 29.7 % (ref 14.0–49.0)
MCHC: 34.5 g/dL (ref 32.0–36.0)
MONO%: 11 % (ref 0.0–14.0)
NEUT#: 3.4 10*3/uL (ref 1.5–6.5)
RBC: 4.9 10*6/uL (ref 4.20–5.82)
nRBC: 0 % (ref 0–0)

## 2010-03-27 LAB — COMPREHENSIVE METABOLIC PANEL
Albumin: 4.5 g/dL (ref 3.5–5.2)
Alkaline Phosphatase: 80 U/L (ref 39–117)
BUN: 14 mg/dL (ref 6–23)
CO2: 24 mEq/L (ref 19–32)
Calcium: 9.5 mg/dL (ref 8.4–10.5)
Chloride: 103 mEq/L (ref 96–112)
Potassium: 4.2 mEq/L (ref 3.5–5.3)

## 2010-04-28 ENCOUNTER — Other Ambulatory Visit: Payer: Self-pay | Admitting: Family Medicine

## 2010-05-01 ENCOUNTER — Ambulatory Visit
Admission: RE | Admit: 2010-05-01 | Discharge: 2010-05-01 | Disposition: A | Discharge status: Home / Self Care | Payer: BC Managed Care – PPO | Source: Ambulatory Visit | Attending: Family Medicine | Admitting: Family Medicine

## 2010-05-29 ENCOUNTER — Encounter (HOSPITAL_BASED_OUTPATIENT_CLINIC_OR_DEPARTMENT_OTHER): Payer: BC Managed Care – PPO | Admitting: Internal Medicine

## 2010-05-29 ENCOUNTER — Other Ambulatory Visit: Payer: Self-pay | Admitting: Internal Medicine

## 2010-05-29 DIAGNOSIS — Z5112 Encounter for antineoplastic immunotherapy: Secondary | ICD-10-CM

## 2010-05-29 DIAGNOSIS — C8588 Other specified types of non-Hodgkin lymphoma, lymph nodes of multiple sites: Secondary | ICD-10-CM

## 2010-05-29 LAB — CBC WITH DIFFERENTIAL/PLATELET
BASO%: 0.5 % (ref 0.0–2.0)
Eosinophils Absolute: 0.3 10*3/uL (ref 0.0–0.5)
LYMPH%: 32.8 % (ref 14.0–49.0)
MCHC: 35 g/dL (ref 32.0–36.0)
MONO#: 0.6 10*3/uL (ref 0.1–0.9)
MONO%: 14 % (ref 0.0–14.0)
NEUT#: 1.9 10*3/uL (ref 1.5–6.5)
Platelets: 210 10*3/uL (ref 140–400)
RBC: 4.71 10*6/uL (ref 4.20–5.82)
RDW: 13 % (ref 11.0–14.6)
WBC: 4 10*3/uL (ref 4.0–10.3)

## 2010-05-29 LAB — COMPREHENSIVE METABOLIC PANEL
ALT: 11 U/L (ref 0–53)
Albumin: 4.3 g/dL (ref 3.5–5.2)
Alkaline Phosphatase: 82 U/L (ref 39–117)
CO2: 22 mEq/L (ref 19–32)
Potassium: 4.3 mEq/L (ref 3.5–5.3)
Sodium: 140 mEq/L (ref 135–145)
Total Bilirubin: 0.5 mg/dL (ref 0.3–1.2)
Total Protein: 6.5 g/dL (ref 6.0–8.3)

## 2010-07-18 NOTE — Op Note (Signed)
NAME:  Eric [name], WAH NO.:  [id number]   MEDICAL RECORD NO.:  [id number]          PATIENT TYPE:  AMB   LOCATION:  SDS                          FACILITY:  [facility]   PHYSICIAN:  Velora Heckler, MD      DATE OF BIRTH:  1941/01/03   DATE OF PROCEDURE:  01/19/2008  DATE OF DISCHARGE:  01/19/2008                               OPERATIVE REPORT   PREOPERATIVE DIAGNOSIS:  Lymphadenopathy.   POSTOPERATIVE DIAGNOSIS:  Lymphadenopathy.   PROCEDURE:  Right axillary lymph node excision.   SURGEON:  Velora Heckler, MD, FACS   ANESTHESIA:  General.   ESTIMATED BLOOD LOSS:  Minimal.   PREPARATION:  Betadine.   COMPLICATIONS:  None.   INDICATIONS:  The patient is a 70 year old male from [place], [place].  He presents at the request of Dr. Janace Hoard for  generalized lymphadenopathy.  This had developed over the past several  weeks.  On exam, the patient had bilateral cervical lymphadenopathy,  bilateral axillary lymphadenopathy, and left groin lymphadenopathy.  Concern is raised over possible lymphoma.  The patient now comes to  Surgery for lymph node excision for definitive diagnosis.   BODY OF REPORT:  Procedure was done in OR #70 at [facility].  The patient was brought to the operating room and placed in  supine position on the operating room table.  Following administration  of general anesthesia, the patient's right arm was positioned at 90  degrees to his side.  Axilla was prepped and draped in the usual strict  aseptic fashion.  After ascertaining that an adequate level of  anesthesia had been achieved, a low axillary incision was made with a  #10 blade.  Dissection was carried in subcutaneous tissues and  hemostasis was obtained with electrocautery.  Numerous enlarged soft  lymph nodes were present.  Approximately, 8-10 lymph nodes were excised  using the electrocautery and medium Ligaclips for hemostasis.  Specimen  was submitted  fresh to pathology for review.  Good hemostasis was noted  in the axilla.  Subcutaneous tissues were closed with interrupted 3-0  Vicryl sutures.  Skin was anesthetized with local anesthetic.  Skin was  closed with a running 4-0 Monocryl  subcuticular suture.  Wound was washed and dried and Benzoin and Steri-  Strips were applied.  Sterile dressings were applied.  The patient was  awakened from anesthesia and brought to the recovery room in stable  condition.  The patient tolerated the procedure well.      Velora Heckler, MD  Electronically Signed     TMG/MEDQ  D:  01/19/2008  T:  01/20/2008  Job:  [id number]   cc:   Peyton Najjar, MD

## 2010-11-15 ENCOUNTER — Encounter: Payer: Self-pay | Admitting: Gastroenterology

## 2010-11-24 ENCOUNTER — Other Ambulatory Visit: Payer: Self-pay | Admitting: Internal Medicine

## 2010-11-24 DIAGNOSIS — C8590 Non-Hodgkin lymphoma, unspecified, unspecified site: Secondary | ICD-10-CM

## 2010-11-28 ENCOUNTER — Encounter (HOSPITAL_BASED_OUTPATIENT_CLINIC_OR_DEPARTMENT_OTHER): Payer: BC Managed Care – PPO | Admitting: Internal Medicine

## 2010-11-28 ENCOUNTER — Other Ambulatory Visit: Payer: Self-pay | Admitting: Internal Medicine

## 2010-11-28 ENCOUNTER — Ambulatory Visit (HOSPITAL_COMMUNITY)
Admission: RE | Admit: 2010-11-28 | Discharge: 2010-11-28 | Disposition: A | Discharge status: Home / Self Care | Payer: BC Managed Care – PPO | Source: Ambulatory Visit | Attending: Internal Medicine | Admitting: Internal Medicine

## 2010-11-28 ENCOUNTER — Encounter (HOSPITAL_COMMUNITY): Payer: Self-pay

## 2010-11-28 DIAGNOSIS — Z5112 Encounter for antineoplastic immunotherapy: Secondary | ICD-10-CM

## 2010-11-28 DIAGNOSIS — I700 Atherosclerosis of aorta: Secondary | ICD-10-CM | POA: Insufficient documentation

## 2010-11-28 DIAGNOSIS — C8590 Non-Hodgkin lymphoma, unspecified, unspecified site: Secondary | ICD-10-CM

## 2010-11-28 DIAGNOSIS — C8588 Other specified types of non-Hodgkin lymphoma, lymph nodes of multiple sites: Secondary | ICD-10-CM

## 2010-11-28 DIAGNOSIS — C8583 Other specified types of non-Hodgkin lymphoma, intra-abdominal lymph nodes: Secondary | ICD-10-CM | POA: Insufficient documentation

## 2010-11-28 DIAGNOSIS — R599 Enlarged lymph nodes, unspecified: Secondary | ICD-10-CM | POA: Insufficient documentation

## 2010-11-28 LAB — COMPREHENSIVE METABOLIC PANEL
ALT: 12 U/L (ref 0–53)
AST: 19 U/L (ref 0–37)
Albumin: 3.9 g/dL (ref 3.5–5.2)
CO2: 29 mEq/L (ref 19–32)
Calcium: 9.5 mg/dL (ref 8.4–10.5)
Chloride: 102 mEq/L (ref 96–112)
Creatinine, Ser: 0.93 mg/dL (ref 0.50–1.35)
Potassium: 4.3 mEq/L (ref 3.5–5.3)
Total Protein: 6.6 g/dL (ref 6.0–8.3)

## 2010-11-28 LAB — CBC WITH DIFFERENTIAL/PLATELET
BASO%: 0.5 % (ref 0.0–2.0)
EOS%: 2 % (ref 0.0–7.0)
MCH: 30.9 pg (ref 27.2–33.4)
MCHC: 34.3 g/dL (ref 32.0–36.0)
MONO#: 0.7 10*3/uL (ref 0.1–0.9)
RDW: 13.3 % (ref 11.0–14.6)
WBC: 6 10*3/uL (ref 4.0–10.3)
lymph#: 1.8 10*3/uL (ref 0.9–3.3)

## 2010-11-28 MED ORDER — IOHEXOL 300 MG/ML  SOLN: 100.0000 mL | Freq: Once | INTRAMUSCULAR | Status: AC | PRN

## 2010-12-05 LAB — PROTIME-INR
INR: 1.1
Prothrombin Time: 14.3

## 2010-12-05 LAB — CBC
MCV: 89.2
Platelets: 186
RBC: 4.16 — ABNORMAL LOW
WBC: 6

## 2010-12-05 LAB — DIFFERENTIAL
Eosinophils Absolute: 0
Lymphocytes Relative: 57 — ABNORMAL HIGH
Lymphs Abs: 3.4
Monocytes Relative: 6
Neutro Abs: 2.1
Neutrophils Relative %: 36 — ABNORMAL LOW

## 2010-12-05 LAB — BASIC METABOLIC PANEL
BUN: 10
Calcium: 8.7
Chloride: 108
Creatinine, Ser: 1.01
GFR calc Af Amer: >60
GFR calc non Af Amer: >60

## 2010-12-07 ENCOUNTER — Other Ambulatory Visit: Payer: Self-pay | Admitting: Internal Medicine

## 2010-12-07 ENCOUNTER — Encounter (HOSPITAL_BASED_OUTPATIENT_CLINIC_OR_DEPARTMENT_OTHER): Payer: BC Managed Care – PPO | Admitting: Internal Medicine

## 2010-12-07 DIAGNOSIS — R5381 Other malaise: Secondary | ICD-10-CM

## 2010-12-07 DIAGNOSIS — R5383 Other fatigue: Secondary | ICD-10-CM

## 2010-12-07 DIAGNOSIS — Z5112 Encounter for antineoplastic immunotherapy: Secondary | ICD-10-CM

## 2010-12-07 DIAGNOSIS — C8588 Other specified types of non-Hodgkin lymphoma, lymph nodes of multiple sites: Secondary | ICD-10-CM

## 2010-12-07 LAB — LACTATE DEHYDROGENASE: LDH: 167 U/L (ref 94–250)

## 2010-12-08 LAB — DIFFERENTIAL
Basophils Absolute: 0 10*3/uL (ref 0.0–0.1)
Eosinophils Absolute: 0.1 10*3/uL (ref 0.0–0.7)
Eosinophils Relative: 1 % (ref 0–5)
Lymphocytes Relative: 54 % — ABNORMAL HIGH (ref 12–46)
Neutrophils Relative %: 38 % — ABNORMAL LOW (ref 43–77)

## 2010-12-08 LAB — GLUCOSE, CAPILLARY

## 2010-12-08 LAB — CBC
HCT: 36.3 % — ABNORMAL LOW (ref 39.0–52.0)
Platelets: 154 10*3/uL (ref 150–400)
RDW: 14.6 % (ref 11.5–15.5)
WBC: 4.5 10*3/uL (ref 4.0–10.5)

## 2010-12-08 LAB — CHROMOSOME ANALYSIS, BONE MARROW

## 2010-12-08 LAB — BONE MARROW EXAM

## 2010-12-11 ENCOUNTER — Encounter (HOSPITAL_BASED_OUTPATIENT_CLINIC_OR_DEPARTMENT_OTHER): Payer: BC Managed Care – PPO | Admitting: Internal Medicine

## 2010-12-11 ENCOUNTER — Other Ambulatory Visit: Payer: Self-pay | Admitting: Internal Medicine

## 2010-12-11 DIAGNOSIS — Z5112 Encounter for antineoplastic immunotherapy: Secondary | ICD-10-CM

## 2010-12-11 DIAGNOSIS — C8588 Other specified types of non-Hodgkin lymphoma, lymph nodes of multiple sites: Secondary | ICD-10-CM

## 2010-12-11 LAB — CBC WITH DIFFERENTIAL/PLATELET
BASO%: 0.4 % (ref 0.0–2.0)
EOS%: 4.7 % (ref 0.0–7.0)
MCH: 30.3 pg (ref 27.2–33.4)
MCV: 88 fL (ref 79.3–98.0)
MONO%: 14 % (ref 0.0–14.0)
NEUT#: 2.3 10*3/uL (ref 1.5–6.5)
RBC: 4.92 10*6/uL (ref 4.20–5.82)
RDW: 13.3 % (ref 11.0–14.6)
lymph#: 1.6 10*3/uL (ref 0.9–3.3)
nRBC: 0 % (ref 0–0)

## 2010-12-11 LAB — COMPREHENSIVE METABOLIC PANEL
ALT: 11 U/L (ref 0–53)
AST: 17 U/L (ref 0–37)
Alkaline Phosphatase: 70 U/L (ref 39–117)
Potassium: 4.2 mEq/L (ref 3.5–5.3)
Sodium: 141 mEq/L (ref 135–145)
Total Bilirubin: 0.3 mg/dL (ref 0.3–1.2)
Total Protein: 5.2 g/dL — ABNORMAL LOW (ref 6.0–8.3)

## 2011-02-09 ENCOUNTER — Ambulatory Visit: Payer: BC Managed Care – PPO

## 2011-02-14 ENCOUNTER — Other Ambulatory Visit: Payer: BC Managed Care – PPO | Admitting: Lab

## 2011-02-14 ENCOUNTER — Ambulatory Visit: Payer: BC Managed Care – PPO | Admitting: Physician Assistant

## 2011-05-25 ENCOUNTER — Telehealth: Payer: Self-pay | Admitting: Internal Medicine

## 2011-05-25 NOTE — Telephone Encounter (Signed)
Pt called today for appt w/MM. Per nurse appts for lb/fu/tx from dec 2014 were r/s to 3/28 @ 9 am. Per nurse appt scheduled w/MM. Pt given new appt for 3/28 @ 9 am.

## 2011-05-30 ENCOUNTER — Other Ambulatory Visit: Payer: Self-pay | Admitting: Medical Oncology

## 2011-05-30 DIAGNOSIS — C8590 Non-Hodgkin lymphoma, unspecified, unspecified site: Secondary | ICD-10-CM

## 2011-05-31 ENCOUNTER — Other Ambulatory Visit: Payer: Self-pay | Admitting: Emergency Medicine

## 2011-05-31 ENCOUNTER — Telehealth: Payer: Self-pay | Admitting: Internal Medicine

## 2011-05-31 ENCOUNTER — Other Ambulatory Visit (HOSPITAL_BASED_OUTPATIENT_CLINIC_OR_DEPARTMENT_OTHER): Payer: BC Managed Care – PPO | Admitting: Lab

## 2011-05-31 ENCOUNTER — Ambulatory Visit (HOSPITAL_BASED_OUTPATIENT_CLINIC_OR_DEPARTMENT_OTHER): Payer: BC Managed Care – PPO | Admitting: Internal Medicine

## 2011-05-31 VITALS — BP 146/88 | HR 71 | Temp 97.2°F | Ht 66.0 in | Wt 162.2 lb

## 2011-05-31 DIAGNOSIS — C8589 Other specified types of non-Hodgkin lymphoma, extranodal and solid organ sites: Secondary | ICD-10-CM

## 2011-05-31 DIAGNOSIS — C9190 Lymphoid leukemia, unspecified not having achieved remission: Secondary | ICD-10-CM

## 2011-05-31 DIAGNOSIS — C8590 Non-Hodgkin lymphoma, unspecified, unspecified site: Secondary | ICD-10-CM

## 2011-05-31 LAB — CBC WITH DIFFERENTIAL/PLATELET
BASO%: 0.8 % (ref 0.0–2.0)
HCT: 40.4 % (ref 38.4–49.9)
LYMPH%: 36.9 % (ref 14.0–49.0)
MCH: 30.2 pg (ref 27.2–33.4)
MCHC: 33.8 g/dL (ref 32.0–36.0)
MCV: 89.5 fL (ref 79.3–98.0)
MONO#: 0.8 10*3/uL (ref 0.1–0.9)
MONO%: 13.4 % (ref 0.0–14.0)
NEUT%: 45.1 % (ref 39.0–75.0)
Platelets: 189 10*3/uL (ref 140–400)
WBC: 6.3 10*3/uL (ref 4.0–10.3)

## 2011-05-31 LAB — COMPREHENSIVE METABOLIC PANEL
ALT: 15 U/L (ref 0–53)
CO2: 27 mEq/L (ref 19–32)
Creatinine, Ser: 1.02 mg/dL (ref 0.50–1.35)
Total Bilirubin: 0.6 mg/dL (ref 0.3–1.2)

## 2011-05-31 LAB — LACTATE DEHYDROGENASE: LDH: 224 U/L (ref 94–250)

## 2011-05-31 NOTE — Progress Notes (Signed)
[facility] [phone]   [phone]  OFFICE PROGRESS NOTE  GUEST, Eric Stapler, MD, MD [address] [place] [address]  PRINCIPAL DIAGNOSIS:  Bulky stage IV low grade lymphoma diagnosed in November 2009.  PRIOR THERAPY:   1. Status post 7 cycles of systemic chemotherapy with CHOP/Rituxan.  Last dose was given May 2010. 2. Status post 6 cycles of maintenance Rituxan 375 mg/sq m given every 2 months.  Last dose was given on May 29, 2010, and the patient was lost to followup at that time. He received another dose on 12/11/2010, then again missed 2 doses.  INTERVAL HISTORY: Eric [name] 71 y.o. male returns to the clinic today for followup visit. The patient Ms. the last 2 doses of his maintenance Rituxan because of  No-show. He attributed it to change in Job and loss of insurance. He noted palpable lymphadenopathy in the neck as well as axilla and inguinal area. He denied having any significant weight loss but he started having night sweats. He has no significant chest pain or shortness of breath.  ALLERGIES:  is allergic to neosporin.  MEDICATIONS:  Current Outpatient Prescriptions  Medication Sig Dispense Refill  . cyanocobalamin 1000 MCG tablet Take 100 mcg by mouth daily.      . ferrous sulfate 325 (65 FE) MG tablet Take 325 mg by mouth daily with breakfast.      . omeprazole (PRILOSEC) 40 MG capsule Take 40 mg by mouth daily.      Marland Kitchen zolpidem (AMBIEN) 5 MG tablet Take 10 mg by mouth at bedtime as needed.      . ranitidine (ZANTAC) 150 MG tablet Take 150 mg by mouth 2 (two) times daily. Alternates with omeperazole        REVIEW OF SYSTEMS:  A comprehensive review of systems was negative except for: Constitutional: positive for fatigue and night sweats Hematologic/lymphatic: positive for lymphadenopathy   PHYSICAL EXAMINATION: General appearance: alert, cooperative, fatigued and no distress Head: Normocephalic, without obvious  abnormality, atraumatic Neck: moderate anterior cervical adenopathy Lymph nodes: Cervical adenopathy: , Axillary adenopathy:  and Inguinal adenopathy:  Resp: clear to auscultation bilaterally Cardio: regular rate and rhythm, S1, S2 normal, no murmur, click, rub or gallop GI: soft, non-tender; bowel sounds normal; no masses,  no organomegaly Extremities: extremities normal, atraumatic, no cyanosis or edema Neurologic: Alert and oriented X 3, normal strength and tone. Normal symmetric reflexes. Normal coordination and gait  ECOG PERFORMANCE STATUS: 1 - Symptomatic but completely ambulatory  Blood pressure 146/88, pulse 71, temperature 97.2 F (36.2 C), temperature source Oral, height 5\' 6"  (1.676 m), weight 162 lb 3.2 oz (73.573 kg).  LABORATORY DATA: Lab Results  Component Value Date   WBC 6.3 05/31/2011   HGB 13.6 05/31/2011   HCT 40.4 05/31/2011   MCV 89.5 05/31/2011   PLT 189 05/31/2011      Chemistry      Component Value Date/Time   NA 141 12/11/2010 0936   NA 141 03/23/2010 1301   K 4.2 12/11/2010 0936   K 4.7 03/23/2010 1301   CL 108 12/11/2010 0936   CL 99 03/23/2010 1301   CO2 26 12/11/2010 0936   CO2 26 03/23/2010 1301   BUN 15 12/11/2010 0936   BUN 12 03/23/2010 1301   CREATININE 0.94 12/11/2010 0936   CREATININE 1.0 03/23/2010 1301      Component Value Date/Time   CALCIUM 8.7 12/11/2010 0936   CALCIUM 9.1 03/23/2010 1301  ALKPHOS 70 12/11/2010 0936   ALKPHOS 78 03/23/2010 1301   AST 17 12/11/2010 0936   AST 25 03/23/2010 1301   ALT 11 12/11/2010 0936   BILITOT 0.3 12/11/2010 0936   BILITOT 0.60 03/23/2010 1301       RADIOGRAPHIC STUDIES: No results found.  ASSESSMENT: This is a very pleasant 71 years old [place] American man with bulky stage IV low-grade lymphoma status post systemic chemotherapy with CHOP/Rituxan followed by 2 cycles of maintenance Rituxan that was discontinued secondary to noncompliance. The patient has actinic evidence for disease recurrence with palpable  lymphadenopathy in the neck, axilla and inguinal area  PLAN: I recommended for the patient to have repeat CT scan of the neck, chest, abdomen and pelvis for restaging of his disease. I would see him back for followup visit in one week for evaluation and discussion of his scan results and treatment options.   All questions were answered. The patient knows to call the clinic with any problems, questions or concerns. We can certainly see the patient much sooner if necessary.

## 2011-05-31 NOTE — Telephone Encounter (Signed)
appts made and printed for pt aom °

## 2011-06-01 ENCOUNTER — Ambulatory Visit: Payer: BC Managed Care – PPO

## 2011-06-02 ENCOUNTER — Other Ambulatory Visit: Payer: Self-pay | Admitting: Physician Assistant

## 2011-06-02 DIAGNOSIS — C8590 Non-Hodgkin lymphoma, unspecified, unspecified site: Secondary | ICD-10-CM

## 2011-06-02 MED ORDER — ZOLPIDEM TARTRATE 5 MG PO TABS: 10.0000 mg | ORAL_TABLET | Freq: Every evening | ORAL | Status: DC | PRN

## 2011-06-02 NOTE — Telephone Encounter (Signed)
RF done.  Eric [name]

## 2011-06-04 ENCOUNTER — Ambulatory Visit (HOSPITAL_COMMUNITY)
Admission: RE | Admit: 2011-06-04 | Discharge: 2011-06-04 | Disposition: A | Discharge status: Home / Self Care | Payer: Medicare Other | Source: Ambulatory Visit | Attending: Internal Medicine | Admitting: Internal Medicine

## 2011-06-04 DIAGNOSIS — N400 Enlarged prostate without lower urinary tract symptoms: Secondary | ICD-10-CM | POA: Insufficient documentation

## 2011-06-04 DIAGNOSIS — C8590 Non-Hodgkin lymphoma, unspecified, unspecified site: Secondary | ICD-10-CM

## 2011-06-04 DIAGNOSIS — R599 Enlarged lymph nodes, unspecified: Secondary | ICD-10-CM | POA: Insufficient documentation

## 2011-06-04 DIAGNOSIS — C8589 Other specified types of non-Hodgkin lymphoma, extranodal and solid organ sites: Secondary | ICD-10-CM | POA: Insufficient documentation

## 2011-06-04 DIAGNOSIS — K5730 Diverticulosis of large intestine without perforation or abscess without bleeding: Secondary | ICD-10-CM | POA: Insufficient documentation

## 2011-06-04 DIAGNOSIS — K449 Diaphragmatic hernia without obstruction or gangrene: Secondary | ICD-10-CM | POA: Insufficient documentation

## 2011-06-04 DIAGNOSIS — M47814 Spondylosis without myelopathy or radiculopathy, thoracic region: Secondary | ICD-10-CM | POA: Insufficient documentation

## 2011-06-04 DIAGNOSIS — N281 Cyst of kidney, acquired: Secondary | ICD-10-CM | POA: Insufficient documentation

## 2011-06-04 MED ORDER — IOHEXOL 300 MG/ML  SOLN
100.0000 mL | Freq: Once | INTRAMUSCULAR | Status: AC | PRN
  Administered 2011-06-04: 125 mL via INTRAVENOUS

## 2011-06-05 ENCOUNTER — Ambulatory Visit (HOSPITAL_BASED_OUTPATIENT_CLINIC_OR_DEPARTMENT_OTHER): Payer: BC Managed Care – PPO | Admitting: Internal Medicine

## 2011-06-05 VITALS — BP 150/83 | HR 67 | Temp 97.6°F | Ht 66.0 in | Wt 156.6 lb

## 2011-06-05 DIAGNOSIS — C8590 Non-Hodgkin lymphoma, unspecified, unspecified site: Secondary | ICD-10-CM

## 2011-06-05 DIAGNOSIS — C8589 Other specified types of non-Hodgkin lymphoma, extranodal and solid organ sites: Secondary | ICD-10-CM

## 2011-06-05 MED ORDER — ZOLPIDEM TARTRATE 5 MG PO TABS: 10.0000 mg | ORAL_TABLET | Freq: Every evening | ORAL | Status: DC | PRN

## 2011-06-05 NOTE — Progress Notes (Signed)
[facility] [phone]   [phone]  OFFICE PROGRESS NOTE  GUEST, Eric Stapler, MD, MD [address] [place] [address]  PRINCIPAL DIAGNOSIS: Bulky stage IV low grade lymphoma diagnosed in November 2009.   PRIOR THERAPY:  1. Status post 7 cycles of systemic chemotherapy with CHOP/Rituxan. Last dose was given May 2010. 2. Status post 6 cycles of maintenance Rituxan 375 mg/sq m given every 2 months. Last dose was given on May 29, 2010, and the patient was lost to followup at that time. He received another dose on 12/11/2010, then again missed 2 doses.  CURRENT THERAPY: The patient will start next week the first cycle of systemic chemotherapy with Rituxan 375 mg/M2 on day 1 and that bendamustine 90 mg/M2 on days 1 and 2 every 4 weeks.  INTERVAL HISTORY: Eric [name] 71 y.o. male returns to the clinic today for followup visit. He denied having any specific complaints today except for the palpable lymphadenopathy and fatigue. No significant weight loss or night sweats. He has no chest pain or shortness of breath. No cough or hemoptysis. The patient has repeat CT scan of the neck, chest, abdomen and pelvis performed recently and he is here today for evaluation and discussion of his scan results  ALLERGIES:  is allergic to neosporin.  MEDICATIONS:  Current Outpatient Prescriptions  Medication Sig Dispense Refill  . cyanocobalamin 1000 MCG tablet Take 100 mcg by mouth daily.      . ferrous sulfate 325 (65 FE) MG tablet Take 325 mg by mouth daily with breakfast.      . omeprazole (PRILOSEC) 40 MG capsule Take 40 mg by mouth daily.      . ranitidine (ZANTAC) 150 MG tablet Take 150 mg by mouth 2 (two) times daily. Alternates with omeperazole      . zolpidem (AMBIEN) 5 MG tablet Take 2 tablets (10 mg total) by mouth at bedtime as needed.  30 tablet  0  . DISCONTD: zolpidem (AMBIEN) 5 MG tablet Take 2 tablets (10 mg total) by mouth at bedtime as  needed.  30 tablet  0   No current facility-administered medications for this visit.   Facility-Administered Medications Ordered in Other Visits  Medication Dose Route Frequency Provider Last Rate Last Dose  . iohexol (OMNIPAQUE) 300 MG/ML solution 100 mL  100 mL Intravenous Once PRN Medication Radiologist, MD   125 mL at 06/04/11 1131    REVIEW OF SYSTEMS:  A comprehensive review of systems was negative except for: Constitutional: positive for fatigue   PHYSICAL EXAMINATION: General appearance: alert, cooperative and no distress Neck: palpable left cervical LNs Lymph nodes: Cervical adenopathy: , Axillary adenopathy: , Supraclavicular adenopathy:  and Inguinal adenopathy:  Resp: clear to auscultation bilaterally Cardio: regular rate and rhythm, S1, S2 normal, no murmur, click, rub or gallop GI: soft, non-tender; bowel sounds normal; no masses,  no organomegaly Extremities: extremities normal, atraumatic, no cyanosis or edema Neurologic: Alert and oriented X 3, normal strength and tone. Normal symmetric reflexes. Normal coordination and gait  ECOG PERFORMANCE STATUS: 1 - Symptomatic but completely ambulatory  Blood pressure 150/83, pulse 67, temperature 97.6 F (36.4 C), temperature source Oral, height 5\' 6"  (1.676 m), weight 156 lb 9.6 oz (71.033 kg).  LABORATORY DATA: Lab Results  Component Value Date   WBC 6.3 05/31/2011   HGB 13.6 05/31/2011   HCT 40.4 05/31/2011   MCV 89.5 05/31/2011   PLT 189 05/31/2011      Chemistry  Component Value Date/Time   NA 142 05/31/2011 0913   NA 141 03/23/2010 1301   K 3.8 05/31/2011 0913   K 4.7 03/23/2010 1301   CL 104 05/31/2011 0913   CL 99 03/23/2010 1301   CO2 27 05/31/2011 0913   CO2 26 03/23/2010 1301   BUN 11 05/31/2011 0913   BUN 12 03/23/2010 1301   CREATININE 1.02 05/31/2011 0913   CREATININE 1.0 03/23/2010 1301      Component Value Date/Time   CALCIUM 9.3 05/31/2011 0913   CALCIUM 9.1 03/23/2010 1301   ALKPHOS 84 05/31/2011 0913    ALKPHOS 78 03/23/2010 1301   AST 25 05/31/2011 0913   AST 25 03/23/2010 1301   ALT 15 05/31/2011 0913   BILITOT 0.6 05/31/2011 0913   BILITOT 0.60 03/23/2010 1301       RADIOGRAPHIC STUDIES: Ct Soft Tissue Neck W Contrast  06/04/2011  *RADIOLOGY REPORT*  Clinical Data:  Lymphoma restaging  CT NECK, CHEST, ABDOMEN AND PELVIS WITH CONTRAST  Technique:  Multidetector CT imaging of the neck, chest, abdomen and pelvis was performed using the standard protocol following the bolus administration of intravenous contrast.  Contrast: OMNIPAQUE IOHEXOL 300 MG/ML IJ SOLN  Comparison:  11/28/2010   CT NECK  Findings:  The visualized intracranial contents appear normal.  The parotid and submandibular glands are within normal limits.  Retention cyst versus polyp is noted within the right maxillary sinus.  The mastoid air cells appear clear.  There is mild multilevel spondylosis within the thoracic spine.  Bilateral cervical adenopathy is again identified.  Left posterior cervical node measures 1.5 cm, image 51.  Previously this measured 0.8 cm.  Right posterior cervical lymph node measures 1.3 cm, image 39. Previously this measured 0.8 cm.  No enhancing masses identified.  The thyroid gland appears normal.  Review of the visualized bony structures is significant for mild spondylosis.  IMPRESSION:  1.  Interval progression of bilateral cervical adenopathy   CT CHEST  Findings:  Progression of bilateral supraclavicular adenopathy.  Lymph node on the left measures 1 cm, image #2.  Previously this node measured 0.7 cm.  Progression of bulky bilateral axillary adenopathy.  Large left axillary lymph node measures 2.6 cm, image number 13.  Previously this measured 0.9 cm.  Right axillary lymph node measures 1.4 cm, image 12.  Previously this measured 1.1 cm.  Progression of mediastinal and bilateral hilar adenopathy.  For example, subcarinal lymph node measures 1.8 cm, image 30. Previously this measured 0.8 cm.  No  pericardial or pleural effusions.  The lungs appear clear.  There is no airspace consolidation.  No suspicious pulmonary parenchymal nodule or mass.  The visualized osseous structures are unremarkable.  No aggressive bone lesions noted.  IMPRESSION:  1.  Interval progression of mediastinal, axillary and supraclavicular adenopathy.   CT ABDOMEN AND PELVIS  Findings: The spleen appears normal.  Both of the adrenal glands are normal.  Normal appearance of the liver.  The gallbladder appears normal.  There is no biliary dilatation. Pancreas is normal.  Bilateral renal cysts are identified and appears similar to previous exam.  There has been significant progression of upper abdominal adenopathy.  Lymph node mass at the level of the left renal vein measures 9.1 x 5.5 cm, image number 64.  Previously this measured 5.7 x 2.9 cm.  At the level of the aortic bifurcation there is a right common iliac node which measures 3.5 cm, image 84.  Previously 1.7 cm.  Extensive  bilateral iliac adenopathy is present.  Left external iliac lymph node measures 3.6 cm, image 111.  Previously 1.6 cm.  There is marked enlargement of the prostate gland which has mass effect upon the bladder base.  The patient has a small hiatal hernia.  The stomach and small bowel loops have a normal course and caliber.  The appendix is identified and appears normal.  There are multiple sigmoid diverticula without acute inflammation.  Review of the visualized osseous structures is significant for mild spondylosis.  IMPRESSION:  1.  Significant progression of abdominal and pelvic adenopathy.  Original Report Authenticated By: Rosealee Albee, M.D.    ASSESSMENT: This is a very pleasant 71 years old American male with recurrent low-grade lymphoma. He was treated in the past with CHOP/Rituxan with significant response followed by maintenance Rituxan but he was not compliant with his treatment.  I discussed the scan results and showed the images to the  patient today.  PLAN: I recommended for him systemic chemotherapy with Rituxan 375 mg/M2 on day 1 and Bendamustine 90 mg/M2 on days 1 and 2 every 4 weeks.  I discussed with the patient adverse effect of this treatment including but not limited to alopecia, myelosuppression, peripheral neuropathy and, nausea and vomiting, liver or renal dysfunction. The patient agreed to the current plan. He is expected to start the first cycle of his treatment next week and he would come back for followup visit in 3 weeks for evaluation and management any adverse effect of his chemotherapy. He was advised to call immediately if he has any concerning symptoms in the interval. For insomnia, he was given a refill of Ambien 10 mg by mouth each bedtime on as needed basis.  All questions were answered. The patient knows to call the clinic with any problems, questions or concerns. We can certainly see the patient much sooner if necessary.  I spent 20 minutes counseling the patient face to face. The total time spent in the appointment was 35 minutes.

## 2011-06-06 ENCOUNTER — Telehealth: Payer: Self-pay | Admitting: Internal Medicine

## 2011-06-06 NOTE — Telephone Encounter (Signed)
Pt came in today to get schedule. tx and lbs have been added and pt was given schedule for April thru June.

## 2011-06-11 ENCOUNTER — Ambulatory Visit (HOSPITAL_BASED_OUTPATIENT_CLINIC_OR_DEPARTMENT_OTHER): Payer: BC Managed Care – PPO

## 2011-06-11 ENCOUNTER — Other Ambulatory Visit: Payer: BC Managed Care – PPO | Admitting: Lab

## 2011-06-11 VITALS — BP 139/72 | HR 74 | Temp 98.4°F

## 2011-06-11 DIAGNOSIS — C8590 Non-Hodgkin lymphoma, unspecified, unspecified site: Secondary | ICD-10-CM

## 2011-06-11 DIAGNOSIS — Z5111 Encounter for antineoplastic chemotherapy: Secondary | ICD-10-CM

## 2011-06-11 DIAGNOSIS — C8589 Other specified types of non-Hodgkin lymphoma, extranodal and solid organ sites: Secondary | ICD-10-CM

## 2011-06-11 DIAGNOSIS — Z5112 Encounter for antineoplastic immunotherapy: Secondary | ICD-10-CM

## 2011-06-11 LAB — CBC WITH DIFFERENTIAL/PLATELET
BASO%: 0.5 % (ref 0.0–2.0)
Eosinophils Absolute: 0.2 10*3/uL (ref 0.0–0.5)
HCT: 41.5 % (ref 38.4–49.9)
LYMPH%: 45.9 % (ref 14.0–49.0)
MCHC: 34.7 g/dL (ref 32.0–36.0)
MONO#: 0.8 10*3/uL (ref 0.1–0.9)
NEUT#: 2.2 10*3/uL (ref 1.5–6.5)
NEUT%: 37.1 % — ABNORMAL LOW (ref 39.0–75.0)
Platelets: 199 10*3/uL (ref 140–400)
RBC: 4.78 10*6/uL (ref 4.20–5.82)
WBC: 5.9 10*3/uL (ref 4.0–10.3)
lymph#: 2.7 10*3/uL (ref 0.9–3.3)
nRBC: 0 % (ref 0–0)

## 2011-06-11 LAB — COMPREHENSIVE METABOLIC PANEL
ALT: 11 U/L (ref 0–53)
CO2: 24 mEq/L (ref 19–32)
Calcium: 9.4 mg/dL (ref 8.4–10.5)
Chloride: 106 mEq/L (ref 96–112)
Creatinine, Ser: 0.94 mg/dL (ref 0.50–1.35)
Glucose, Bld: 85 mg/dL (ref 70–99)
Total Bilirubin: 0.5 mg/dL (ref 0.3–1.2)

## 2011-06-11 MED ORDER — ONDANSETRON 8 MG/50ML IVPB (CHCC)
8.0000 mg | Freq: Once | INTRAVENOUS | Status: AC
  Administered 2011-06-11: 8 mg via INTRAVENOUS

## 2011-06-11 MED ORDER — DIPHENHYDRAMINE HCL 25 MG PO CAPS
50.0000 mg | ORAL_CAPSULE | Freq: Once | ORAL | Status: AC
  Administered 2011-06-11: 50 mg via ORAL

## 2011-06-11 MED ORDER — SODIUM CHLORIDE 0.9 % IV SOLN
375.0000 mg/m2 | Freq: Once | INTRAVENOUS | Status: AC
  Administered 2011-06-11: 700 mg via INTRAVENOUS
  Filled 2011-06-11: qty 70

## 2011-06-11 MED ORDER — ACETAMINOPHEN 325 MG PO TABS
650.0000 mg | ORAL_TABLET | Freq: Once | ORAL | Status: AC
  Administered 2011-06-11: 650 mg via ORAL

## 2011-06-11 MED ORDER — BENDAMUSTINE HCL (LYOPHILIZED PWD) CHEMO INJECTION 100MG
90.0000 mg/m2 | Freq: Once | INTRAVENOUS | Status: AC
  Administered 2011-06-11: 165 mg via INTRAVENOUS
  Filled 2011-06-11: qty 33

## 2011-06-11 MED ORDER — SODIUM CHLORIDE 0.9 % IV SOLN
Freq: Once | INTRAVENOUS | Status: AC
  Administered 2011-06-11: 10:00:00 via INTRAVENOUS

## 2011-06-11 MED ORDER — DEXAMETHASONE SODIUM PHOSPHATE 10 MG/ML IJ SOLN
10.0000 mg | Freq: Once | INTRAMUSCULAR | Status: AC
  Administered 2011-06-11: 10 mg via INTRAVENOUS

## 2011-06-11 NOTE — Patient Instructions (Signed)
[facility] Discharge Instructions for Patients Receiving Chemotherapy  Today you received the following chemotherapy agents: Rituxan, Treanda.    If you develop nausea and vomiting that is not controlled by your nausea medication, call the clinic. If it is after clinic hours your family physician or the after hours number for the clinic or go to the Emergency Department.   BELOW ARE SYMPTOMS THAT SHOULD BE REPORTED IMMEDIATELY:  *FEVER GREATER THAN 100.5 F  *CHILLS WITH OR WITHOUT FEVER  NAUSEA AND VOMITING THAT IS NOT CONTROLLED WITH YOUR NAUSEA MEDICATION  *UNUSUAL SHORTNESS OF BREATH  *UNUSUAL BRUISING OR BLEEDING  TENDERNESS IN MOUTH AND THROAT WITH OR WITHOUT PRESENCE OF ULCERS  *URINARY PROBLEMS  *BOWEL PROBLEMS  UNUSUAL RASH Items with * indicate a potential emergency and should be followed up as soon as possible.  Feel free to call the clinic you have any questions or concerns. The clinic phone number is [phone].

## 2011-06-12 ENCOUNTER — Ambulatory Visit (HOSPITAL_BASED_OUTPATIENT_CLINIC_OR_DEPARTMENT_OTHER): Payer: BC Managed Care – PPO

## 2011-06-12 VITALS — BP 141/74 | HR 74 | Temp 97.5°F

## 2011-06-12 DIAGNOSIS — C8590 Non-Hodgkin lymphoma, unspecified, unspecified site: Secondary | ICD-10-CM

## 2011-06-12 DIAGNOSIS — Z5111 Encounter for antineoplastic chemotherapy: Secondary | ICD-10-CM

## 2011-06-12 DIAGNOSIS — C8589 Other specified types of non-Hodgkin lymphoma, extranodal and solid organ sites: Secondary | ICD-10-CM

## 2011-06-12 MED ORDER — ONDANSETRON 8 MG/50ML IVPB (CHCC)
8.0000 mg | Freq: Once | INTRAVENOUS | Status: AC
  Administered 2011-06-12: 8 mg via INTRAVENOUS

## 2011-06-12 MED ORDER — SODIUM CHLORIDE 0.9 % IV SOLN
90.0000 mg/m2 | Freq: Once | INTRAVENOUS | Status: AC
  Administered 2011-06-12: 165 mg via INTRAVENOUS
  Filled 2011-06-12: qty 33

## 2011-06-12 MED ORDER — DEXAMETHASONE SODIUM PHOSPHATE 10 MG/ML IJ SOLN
10.0000 mg | Freq: Once | INTRAMUSCULAR | Status: AC
  Administered 2011-06-12: 10 mg via INTRAVENOUS

## 2011-06-12 MED ORDER — SODIUM CHLORIDE 0.9 % IV SOLN
Freq: Once | INTRAVENOUS | Status: AC
  Administered 2011-06-12: 15:00:00 via INTRAVENOUS

## 2011-06-12 NOTE — Progress Notes (Signed)
Pt c/o hiccups that started yesterday evening and has been off and on today as well.  Per Dr Donnald Garre it is probably r/t the steroids, if they persist it is okay to give him rx for thorazine.  Pt verbalized understanding and will call if he has any further issues.  SLJ

## 2011-06-12 NOTE — Patient Instructions (Signed)
[facility] Discharge Instructions for Patients Receiving Chemotherapy  Today you received the following chemotherapy agents Treanda  To help prevent nausea and vomiting after your treatment, we encourage you to take your nausea medication    If you develop nausea and vomiting that is not controlled by your nausea medication, call the clinic. If it is after clinic hours your family physician or the after hours number for the clinic or go to the Emergency Department.   BELOW ARE SYMPTOMS THAT SHOULD BE REPORTED IMMEDIATELY:  *FEVER GREATER THAN 100.5 F  *CHILLS WITH OR WITHOUT FEVER  NAUSEA AND VOMITING THAT IS NOT CONTROLLED WITH YOUR NAUSEA MEDICATION  *UNUSUAL SHORTNESS OF BREATH  *UNUSUAL BRUISING OR BLEEDING  TENDERNESS IN MOUTH AND THROAT WITH OR WITHOUT PRESENCE OF ULCERS  *URINARY PROBLEMS  *BOWEL PROBLEMS  UNUSUAL RASH Items with * indicate a potential emergency and should be followed up as soon as possible.  . The clinic phone number is [phone].   I have been informed and understand all the instructions given to me. I know to contact the clinic, my physician, or go to the Emergency Department if any problems should occur. I do not have any questions at this time, but understand that I may call the clinic during office hours   should I have any questions or need assistance in obtaining follow up care.    __________________________________________  _____________  __________ Signature of Patient or Authorized Representative            Date                   Time    __________________________________________ Nurse's Signature

## 2011-06-18 ENCOUNTER — Other Ambulatory Visit (HOSPITAL_BASED_OUTPATIENT_CLINIC_OR_DEPARTMENT_OTHER): Payer: Medicare Other | Admitting: Lab

## 2011-06-18 DIAGNOSIS — C8590 Non-Hodgkin lymphoma, unspecified, unspecified site: Secondary | ICD-10-CM

## 2011-06-18 DIAGNOSIS — C8589 Other specified types of non-Hodgkin lymphoma, extranodal and solid organ sites: Secondary | ICD-10-CM

## 2011-06-18 LAB — COMPREHENSIVE METABOLIC PANEL
AST: 17 U/L (ref 0–37)
Albumin: 3.9 g/dL (ref 3.5–5.2)
Alkaline Phosphatase: 75 U/L (ref 39–117)
Calcium: 8.8 mg/dL (ref 8.4–10.5)
Chloride: 104 mEq/L (ref 96–112)
Potassium: 4.8 mEq/L (ref 3.5–5.3)
Sodium: 140 mEq/L (ref 135–145)
Total Protein: 5.9 g/dL — ABNORMAL LOW (ref 6.0–8.3)

## 2011-06-18 LAB — CBC WITH DIFFERENTIAL/PLATELET
BASO%: 0.4 % (ref 0.0–2.0)
EOS%: 4.4 % (ref 0.0–7.0)
MCH: 30.2 pg (ref 27.2–33.4)
MCHC: 33.5 g/dL (ref 32.0–36.0)
MCV: 90.2 fL (ref 79.3–98.0)
MONO%: 20.5 % — ABNORMAL HIGH (ref 0.0–14.0)
RBC: 4.49 10*6/uL (ref 4.20–5.82)
RDW: 14.1 % (ref 11.0–14.6)
lymph#: 0.4 10*3/uL — ABNORMAL LOW (ref 0.9–3.3)
nRBC: 0 % (ref 0–0)

## 2011-06-25 ENCOUNTER — Other Ambulatory Visit (HOSPITAL_BASED_OUTPATIENT_CLINIC_OR_DEPARTMENT_OTHER): Payer: Medicare Other | Admitting: Lab

## 2011-06-25 DIAGNOSIS — C8588 Other specified types of non-Hodgkin lymphoma, lymph nodes of multiple sites: Secondary | ICD-10-CM

## 2011-06-25 DIAGNOSIS — C8590 Non-Hodgkin lymphoma, unspecified, unspecified site: Secondary | ICD-10-CM

## 2011-06-25 LAB — CBC WITH DIFFERENTIAL/PLATELET
BASO%: 0.8 % (ref 0.0–2.0)
Eosinophils Absolute: 0.1 10*3/uL (ref 0.0–0.5)
HCT: 41 % (ref 38.4–49.9)
LYMPH%: 16.5 % (ref 14.0–49.0)
MCHC: 33.8 g/dL (ref 32.0–36.0)
MCV: 89.5 fL (ref 79.3–98.0)
MONO#: 1 10*3/uL — ABNORMAL HIGH (ref 0.1–0.9)
MONO%: 17.7 % — ABNORMAL HIGH (ref 0.0–14.0)
NEUT%: 62.4 % (ref 39.0–75.0)
Platelets: 250 10*3/uL (ref 140–400)
WBC: 5.5 10*3/uL (ref 4.0–10.3)

## 2011-06-25 LAB — COMPREHENSIVE METABOLIC PANEL
ALT: 12 U/L (ref 0–53)
Alkaline Phosphatase: 89 U/L (ref 39–117)
CO2: 31 mEq/L (ref 19–32)
Creatinine, Ser: 0.92 mg/dL (ref 0.50–1.35)
Sodium: 140 mEq/L (ref 135–145)
Total Bilirubin: 0.5 mg/dL (ref 0.3–1.2)

## 2011-06-25 LAB — LACTATE DEHYDROGENASE: LDH: 211 U/L (ref 94–250)

## 2011-06-26 ENCOUNTER — Other Ambulatory Visit (HOSPITAL_BASED_OUTPATIENT_CLINIC_OR_DEPARTMENT_OTHER): Payer: Medicare Other | Admitting: Lab

## 2011-06-26 ENCOUNTER — Telehealth: Payer: Self-pay | Admitting: Internal Medicine

## 2011-06-26 ENCOUNTER — Encounter: Payer: Self-pay | Admitting: Physician Assistant

## 2011-06-26 ENCOUNTER — Ambulatory Visit (HOSPITAL_BASED_OUTPATIENT_CLINIC_OR_DEPARTMENT_OTHER): Payer: Medicare Other | Admitting: Physician Assistant

## 2011-06-26 VITALS — BP 134/85 | HR 67 | Temp 97.2°F | Ht 66.0 in | Wt 151.0 lb

## 2011-06-26 DIAGNOSIS — C8589 Other specified types of non-Hodgkin lymphoma, extranodal and solid organ sites: Secondary | ICD-10-CM

## 2011-06-26 DIAGNOSIS — C8590 Non-Hodgkin lymphoma, unspecified, unspecified site: Secondary | ICD-10-CM

## 2011-06-26 DIAGNOSIS — G4700 Insomnia, unspecified: Secondary | ICD-10-CM

## 2011-06-26 LAB — COMPREHENSIVE METABOLIC PANEL
Albumin: 4.1 g/dL (ref 3.5–5.2)
BUN: 12 mg/dL (ref 6–23)
Calcium: 9.1 mg/dL (ref 8.4–10.5)
Chloride: 104 mEq/L (ref 96–112)
Glucose, Bld: 82 mg/dL (ref 70–99)
Potassium: 4.3 mEq/L (ref 3.5–5.3)

## 2011-06-26 LAB — CBC WITH DIFFERENTIAL/PLATELET
Basophils Absolute: 0.1 10*3/uL (ref 0.0–0.1)
Eosinophils Absolute: 0.2 10*3/uL (ref 0.0–0.5)
HGB: 13.8 g/dL (ref 13.0–17.1)
MONO#: 0.9 10*3/uL (ref 0.1–0.9)
NEUT#: 2.9 10*3/uL (ref 1.5–6.5)
RDW: 14.4 % (ref 11.0–14.6)
lymph#: 0.9 10*3/uL (ref 0.9–3.3)

## 2011-06-26 NOTE — Progress Notes (Signed)
[facility] Telephone:([phone]) [phone]   Fax:(336) [phone]  OFFICE PROGRESS NOTE  GUEST, [name], MD, MD [address] [place] [address]  PRINCIPAL DIAGNOSIS: Bulky stage IV low grade lymphoma diagnosed in November 2009.   PRIOR THERAPY:  1. Status post 7 cycles of systemic chemotherapy with CHOP/Rituxan. Last dose was given May 2010. 2. Status post 6 cycles of maintenance Rituxan 375 mg/sq m given every 2 months. Last dose was given on May 29, 2010, and the patient was lost to followup at that time. He received another dose on 12/11/2010, then again missed 2 doses.  CURRENT THERAPY:  systemic chemotherapy with Rituxan 375 mg/M2 on day 1 and that bendamustine 90 mg/M2 on days 1 and 2 every 4 weeks, status post 1 cycle  INTERVAL HISTORY: Eric [name] 71 y.o. male returns to the clinic today for followup visit. He tolerated his first cycle without any significant difficulty. He did notice increased urination for the first few days after chemotherapy. He reports that he drinks a lot of water and that his urine was clear and had no foul odor. He still has difficulty sleeping despite the Ambien. He reports difficulty falling asleep as well as staying asleep and also reports that this has been a long-standing problem. He denied any recent night sweats no weight loss, denied chest pain shortness of breath no cough or hemoptysis.   ALLERGIES:  is allergic to neosporin.  MEDICATIONS:  Current Outpatient Prescriptions  Medication Sig Dispense Refill  . cyanocobalamin 1000 MCG tablet Take 100 mcg by mouth daily.      . ferrous sulfate 325 (65 FE) MG tablet Take 325 mg by mouth daily with breakfast.      . omeprazole (PRILOSEC) 40 MG capsule Take 40 mg by mouth daily.      . ranitidine (ZANTAC) 150 MG tablet Take 150 mg by mouth 2 (two) times daily. Alternates with omeperazole      . zolpidem (AMBIEN) 5 MG tablet Take 2 tablets (10 mg total) by mouth at bedtime as  needed.  30 tablet  0    REVIEW OF SYSTEMS:  A comprehensive review of systems was negative except for: Constitutional: positive for fatigue   PHYSICAL EXAMINATION: General appearance: alert, cooperative and no distress Neck: palpable left cervical LNs Lymph nodes: Cervical adenopathy: , Axillary adenopathy: , Supraclavicular adenopathy:  and Inguinal adenopathy:  Resp: clear to auscultation bilaterally Cardio: regular rate and rhythm, S1, S2 normal, no murmur, click, rub or gallop GI: soft, non-tender; bowel sounds normal; no masses,  no organomegaly Extremities: extremities normal, atraumatic, no cyanosis or edema Neurologic: Alert and oriented X 3, normal strength and tone. Normal symmetric reflexes. Normal coordination and gait  ECOG PERFORMANCE STATUS: 1 - Symptomatic but completely ambulatory  Blood pressure 134/85, pulse 67, temperature 97.2 F (36.2 C), temperature source Oral, height 5\' 6"  (1.676 m), weight 151 lb (68.493 kg).  LABORATORY DATA: Lab Results  Component Value Date   WBC 5.0 06/26/2011   HGB 13.8 06/26/2011   HCT 40.9 06/26/2011   MCV 89.5 06/26/2011   PLT 234 06/26/2011      Chemistry      Component Value Date/Time   NA 140 06/25/2011 1405   NA 141 03/23/2010 1301   K 4.4 06/25/2011 1405   K 4.7 03/23/2010 1301   CL 104 06/25/2011 1405   CL 99 03/23/2010 1301   CO2 31 06/25/2011 1405   CO2 26 03/23/2010 1301  BUN 13 06/25/2011 1405   BUN 12 03/23/2010 1301   CREATININE 0.92 06/25/2011 1405   CREATININE 1.0 03/23/2010 1301      Component Value Date/Time   CALCIUM 9.4 06/25/2011 1405   CALCIUM 9.1 03/23/2010 1301   ALKPHOS 89 06/25/2011 1405   ALKPHOS 78 03/23/2010 1301   AST 19 06/25/2011 1405   AST 25 03/23/2010 1301   ALT 12 06/25/2011 1405   BILITOT 0.5 06/25/2011 1405   BILITOT 0.60 03/23/2010 1301       RADIOGRAPHIC STUDIES: Ct Soft Tissue Neck W Contrast  06/04/2011  *RADIOLOGY REPORT*  Clinical Data:  Lymphoma restaging  CT NECK, CHEST, ABDOMEN AND  PELVIS WITH CONTRAST  Technique:  Multidetector CT imaging of the neck, chest, abdomen and pelvis was performed using the standard protocol following the bolus administration of intravenous contrast.  Contrast: OMNIPAQUE IOHEXOL 300 MG/ML IJ SOLN  Comparison:  11/28/2010   CT NECK  Findings:  The visualized intracranial contents appear normal.  The parotid and submandibular glands are within normal limits.  Retention cyst versus polyp is noted within the right maxillary sinus.  The mastoid air cells appear clear.  There is mild multilevel spondylosis within the thoracic spine.  Bilateral cervical adenopathy is again identified.  Left posterior cervical node measures 1.5 cm, image 51.  Previously this measured 0.8 cm.  Right posterior cervical lymph node measures 1.3 cm, image 39. Previously this measured 0.8 cm.  No enhancing masses identified.  The thyroid gland appears normal.  Review of the visualized bony structures is significant for mild spondylosis.  IMPRESSION:  1.  Interval progression of bilateral cervical adenopathy   CT CHEST  Findings:  Progression of bilateral supraclavicular adenopathy.  Lymph node on the left measures 1 cm, image #2.  Previously this node measured 0.7 cm.  Progression of bulky bilateral axillary adenopathy.  Large left axillary lymph node measures 2.6 cm, image number 13.  Previously this measured 0.9 cm.  Right axillary lymph node measures 1.4 cm, image 12.  Previously this measured 1.1 cm.  Progression of mediastinal and bilateral hilar adenopathy.  For example, subcarinal lymph node measures 1.8 cm, image 30. Previously this measured 0.8 cm.  No pericardial or pleural effusions.  The lungs appear clear.  There is no airspace consolidation.  No suspicious pulmonary parenchymal nodule or mass.  The visualized osseous structures are unremarkable.  No aggressive bone lesions noted.  IMPRESSION:  1.  Interval progression of mediastinal, axillary and supraclavicular adenopathy.    CT ABDOMEN AND PELVIS  Findings: The spleen appears normal.  Both of the adrenal glands are normal.  Normal appearance of the liver.  The gallbladder appears normal.  There is no biliary dilatation. Pancreas is normal.  Bilateral renal cysts are identified and appears similar to previous exam.  There has been significant progression of upper abdominal adenopathy.  Lymph node mass at the level of the left renal vein measures 9.1 x 5.5 cm, image number 64.  Previously this measured 5.7 x 2.9 cm.  At the level of the aortic bifurcation there is a right common iliac node which measures 3.5 cm, image 84.  Previously 1.7 cm.  Extensive bilateral iliac adenopathy is present.  Left external iliac lymph node measures 3.6 cm, image 111.  Previously 1.6 cm.  There is marked enlargement of the prostate gland which has mass effect upon the bladder base.  The patient has a small hiatal hernia.  The stomach and small bowel loops have  a normal course and caliber.  The appendix is identified and appears normal.  There are multiple sigmoid diverticula without acute inflammation.  Review of the visualized osseous structures is significant for mild spondylosis.  IMPRESSION:  1.  Significant progression of abdominal and pelvic adenopathy.  Original Report Authenticated By: Rosealee Albee, M.D.    ASSESSMENT: This is a very pleasant 71 years old American male with recurrent low-grade lymphoma. He was treated in the past with CHOP/Rituxan with significant response followed by maintenance Rituxan but he was not compliant with his treatment. His most recent restaging CT scan revealed progression of lymphadenopathy. He is currently being treated with systemic chemotherapy in the form of Rituxan at 375 mg per meter squared on day 1 and Bendamustine 90 mg per meter square given on days 1-2 every 4 weeks, status post 1 cycle. Overall he is tolerated this therapy without significant difficulty. Patient was discussed with Dr. Arbutus Ped.  He'll return on 07/05/2010 for a repeat office visit with CBC differential C. met and LDH and proceed with cycle #2 of his systemic chemotherapy with Rituxan and bendamustine as scheduled on 07/09/2011. He'll continue with Ambien for his insomnia on a when necessary basis.   Eric [name], Eric Ruta E, PA-C   All questions were answered. The patient knows to call the clinic with any problems, questions or concerns. We can certainly see the patient much sooner if necessary.

## 2011-06-26 NOTE — Telephone Encounter (Signed)
Gv pt appt for april-may2013 

## 2011-07-02 ENCOUNTER — Other Ambulatory Visit (HOSPITAL_BASED_OUTPATIENT_CLINIC_OR_DEPARTMENT_OTHER): Payer: Medicare Other | Admitting: Lab

## 2011-07-02 DIAGNOSIS — C8589 Other specified types of non-Hodgkin lymphoma, extranodal and solid organ sites: Secondary | ICD-10-CM

## 2011-07-02 DIAGNOSIS — C8590 Non-Hodgkin lymphoma, unspecified, unspecified site: Secondary | ICD-10-CM

## 2011-07-02 LAB — CBC WITH DIFFERENTIAL/PLATELET
Eosinophils Absolute: 0.2 10*3/uL (ref 0.0–0.5)
HCT: 39.4 % (ref 38.4–49.9)
LYMPH%: 23.7 % (ref 14.0–49.0)
MONO#: 0.9 10*3/uL (ref 0.1–0.9)
NEUT#: 2.2 10*3/uL (ref 1.5–6.5)
Platelets: 189 10*3/uL (ref 140–400)
RBC: 4.39 10*6/uL (ref 4.20–5.82)
WBC: 4.5 10*3/uL (ref 4.0–10.3)
nRBC: 0 % (ref 0–0)

## 2011-07-02 LAB — COMPREHENSIVE METABOLIC PANEL
ALT: 13 U/L (ref 0–53)
Albumin: 4.2 g/dL (ref 3.5–5.2)
CO2: 26 mEq/L (ref 19–32)
Calcium: 9.1 mg/dL (ref 8.4–10.5)
Chloride: 105 mEq/L (ref 96–112)
Creatinine, Ser: 1.15 mg/dL (ref 0.50–1.35)
Total Protein: 5.8 g/dL — ABNORMAL LOW (ref 6.0–8.3)

## 2011-07-02 LAB — LACTATE DEHYDROGENASE: LDH: 179 U/L (ref 94–250)

## 2011-07-05 ENCOUNTER — Ambulatory Visit: Payer: Medicare Other | Admitting: Physician Assistant

## 2011-07-05 ENCOUNTER — Encounter: Payer: Self-pay | Admitting: Physician Assistant

## 2011-07-05 ENCOUNTER — Telehealth: Payer: Self-pay | Admitting: Internal Medicine

## 2011-07-05 ENCOUNTER — Ambulatory Visit (HOSPITAL_BASED_OUTPATIENT_CLINIC_OR_DEPARTMENT_OTHER): Payer: Medicare Other | Admitting: Physician Assistant

## 2011-07-05 ENCOUNTER — Other Ambulatory Visit (HOSPITAL_BASED_OUTPATIENT_CLINIC_OR_DEPARTMENT_OTHER): Payer: Medicare Other | Admitting: Lab

## 2011-07-05 VITALS — BP 133/79 | HR 72 | Temp 97.7°F | Ht 66.0 in | Wt 151.5 lb

## 2011-07-05 DIAGNOSIS — C8588 Other specified types of non-Hodgkin lymphoma, lymph nodes of multiple sites: Secondary | ICD-10-CM

## 2011-07-05 NOTE — Telephone Encounter (Signed)
gv pt appt for may and june2013

## 2011-07-06 NOTE — Progress Notes (Signed)
[facility] Telephone:([phone]   Fax:([phone]  OFFICE PROGRESS NOTE  GUEST, [name], MD, MD [address] [place] [address]  PRINCIPAL DIAGNOSIS: Bulky stage IV low grade lymphoma diagnosed in November 2009.   PRIOR THERAPY:  1. Status post 7 cycles of systemic chemotherapy with CHOP/Rituxan. Last dose was given May 2010. 2. Status post 6 cycles of maintenance Rituxan 375 mg/sq m given every 2 months. Last dose was given on May 29, 2010, and the patient was lost to followup at that time. He received another dose on 12/11/2010, then again missed 2 doses.  CURRENT THERAPY:  systemic chemotherapy with Rituxan 375 mg/M2 on day 1 and that bendamustine 90 mg/M2 on days 1 and 2 every 4 weeks, status post 1 cycle  INTERVAL HISTORY: Greogory Cornette 71 y.o. male returns to the clinic today for followup visit. He tolerated his first cycle without any significant difficulty. Today he feels fine he voices no complaints.  He denied any recent night sweats no weight loss, denied chest pain shortness of breath no cough or hemoptysis. He is due for his second cycle of systemic chemotherapy with Rituxan and bendamustine on 07/09/2011.  ALLERGIES:  is allergic to neosporin.  MEDICATIONS:  Current Outpatient Prescriptions  Medication Sig Dispense Refill  . cyanocobalamin 1000 MCG tablet Take 100 mcg by mouth daily.      . ferrous sulfate 325 (65 FE) MG tablet Take 325 mg by mouth daily with breakfast.      . omeprazole (PRILOSEC) 40 MG capsule Take 40 mg by mouth daily.      . ranitidine (ZANTAC) 150 MG tablet Take 150 mg by mouth 2 (two) times daily. Alternates with omeperazole      . zolpidem (AMBIEN) 5 MG tablet Take 2 tablets (10 mg total) by mouth at bedtime as needed.  30 tablet  0    REVIEW OF SYSTEMS:  A comprehensive review of systems was negative.   PHYSICAL EXAMINATION: General appearance: alert, cooperative and no distress Neck: palpable left  cervical LNs Lymph nodes: Cervical adenopathy: , Axillary adenopathy: , Supraclavicular adenopathy:  and Inguinal adenopathy:  Resp: clear to auscultation bilaterally Cardio: regular rate and rhythm, S1, S2 normal, no murmur, click, rub or gallop GI: soft, non-tender; bowel sounds normal; no masses,  no organomegaly Extremities: extremities normal, atraumatic, no cyanosis or edema Neurologic: Alert and oriented X 3, normal strength and tone. Normal symmetric reflexes. Normal coordination and gait  ECOG PERFORMANCE STATUS: 1 - Symptomatic but completely ambulatory  Blood pressure 133/79, pulse 72, temperature 97.7 F (36.5 C), temperature source Oral, height 5\' 6"  (1.676 m), weight 151 lb 8 oz (68.72 kg).  LABORATORY DATA: Lab Results  Component Value Date   WBC 4.5 07/02/2011   HGB 13.3 07/02/2011   HCT 39.4 07/02/2011   MCV 89.8 07/02/2011   PLT 189 07/02/2011      Chemistry      Component Value Date/Time   NA 140 07/02/2011 1357   NA 141 03/23/2010 1301   K 4.6 07/02/2011 1357   K 4.7 03/23/2010 1301   CL 105 07/02/2011 1357   CL 99 03/23/2010 1301   CO2 26 07/02/2011 1357   CO2 26 03/23/2010 1301   BUN 18 07/02/2011 1357   BUN 12 03/23/2010 1301   CREATININE 1.15 07/02/2011 1357   CREATININE 1.0 03/23/2010 1301      Component Value Date/Time   CALCIUM 9.1 07/02/2011 1357   CALCIUM  9.1 03/23/2010 1301   ALKPHOS 82 07/02/2011 1357   ALKPHOS 78 03/23/2010 1301   AST 18 07/02/2011 1357   AST 25 03/23/2010 1301   ALT 13 07/02/2011 1357   BILITOT 0.3 07/02/2011 1357   BILITOT 0.60 03/23/2010 1301       RADIOGRAPHIC STUDIES: Ct Soft Tissue Neck W Contrast  06/04/2011  *RADIOLOGY REPORT*  Clinical Data:  Lymphoma restaging  CT NECK, CHEST, ABDOMEN AND PELVIS WITH CONTRAST  Technique:  Multidetector CT imaging of the neck, chest, abdomen and pelvis was performed using the standard protocol following the bolus administration of intravenous contrast.  Contrast: OMNIPAQUE IOHEXOL 300 MG/ML  IJ SOLN  Comparison:  11/28/2010   CT NECK  Findings:  The visualized intracranial contents appear normal.  The parotid and submandibular glands are within normal limits.  Retention cyst versus polyp is noted within the right maxillary sinus.  The mastoid air cells appear clear.  There is mild multilevel spondylosis within the thoracic spine.  Bilateral cervical adenopathy is again identified.  Left posterior cervical node measures 1.5 cm, image 51.  Previously this measured 0.8 cm.  Right posterior cervical lymph node measures 1.3 cm, image 39. Previously this measured 0.8 cm.  No enhancing masses identified.  The thyroid gland appears normal.  Review of the visualized bony structures is significant for mild spondylosis.  IMPRESSION:  1.  Interval progression of bilateral cervical adenopathy   CT CHEST  Findings:  Progression of bilateral supraclavicular adenopathy.  Lymph node on the left measures 1 cm, image #2.  Previously this node measured 0.7 cm.  Progression of bulky bilateral axillary adenopathy.  Large left axillary lymph node measures 2.6 cm, image number 13.  Previously this measured 0.9 cm.  Right axillary lymph node measures 1.4 cm, image 12.  Previously this measured 1.1 cm.  Progression of mediastinal and bilateral hilar adenopathy.  For example, subcarinal lymph node measures 1.8 cm, image 30. Previously this measured 0.8 cm.  No pericardial or pleural effusions.  The lungs appear clear.  There is no airspace consolidation.  No suspicious pulmonary parenchymal nodule or mass.  The visualized osseous structures are unremarkable.  No aggressive bone lesions noted.  IMPRESSION:  1.  Interval progression of mediastinal, axillary and supraclavicular adenopathy.   CT ABDOMEN AND PELVIS  Findings: The spleen appears normal.  Both of the adrenal glands are normal.  Normal appearance of the liver.  The gallbladder appears normal.  There is no biliary dilatation. Pancreas is normal.  Bilateral renal cysts  are identified and appears similar to previous exam.  There has been significant progression of upper abdominal adenopathy.  Lymph node mass at the level of the left renal vein measures 9.1 x 5.5 cm, image number 64.  Previously this measured 5.7 x 2.9 cm.  At the level of the aortic bifurcation there is a right common iliac node which measures 3.5 cm, image 84.  Previously 1.7 cm.  Extensive bilateral iliac adenopathy is present.  Left external iliac lymph node measures 3.6 cm, image 111.  Previously 1.6 cm.  There is marked enlargement of the prostate gland which has mass effect upon the bladder base.  The patient has a small hiatal hernia.  The stomach and small bowel loops have a normal course and caliber.  The appendix is identified and appears normal.  There are multiple sigmoid diverticula without acute inflammation.  Review of the visualized osseous structures is significant for mild spondylosis.  IMPRESSION:  1.  Significant progression of abdominal and pelvic adenopathy.  Original Report Authenticated By: Rosealee Albee, M.D.    ASSESSMENT: This is a very pleasant 71 years old American male with recurrent low-grade lymphoma. He was treated in the past with CHOP/Rituxan with significant response followed by maintenance Rituxan but he was not compliant with his treatment. His most recent restaging CT scan revealed progression of lymphadenopathy. He is currently being treated with systemic chemotherapy in the form of Rituxan at 375 mg per meter squared on day 1 and Bendamustine 90 mg per meter square given on days 1-2 every 4 weeks, status post 1 cycle. Overall he is tolerated this therapy without significant difficulty. Patient was discussed with Dr. Arbutus Ped. He'll proceed with his second cycle of systemic chemotherapy with Rituxan and bendamustine as scheduled on 07/09/2011. He'll return in one month with a repeat CBC differential C. met and LDH prior to cycle #3 of his systemic chemotherapy with  Rituxan and bendamustine.   Laural Benes, Abdoul Encinas E, PA-C   All questions were answered. The patient knows to call the clinic with any problems, questions or concerns. We can certainly see the patient much sooner if necessary.

## 2011-07-09 ENCOUNTER — Ambulatory Visit (HOSPITAL_BASED_OUTPATIENT_CLINIC_OR_DEPARTMENT_OTHER): Payer: Medicare Other

## 2011-07-09 ENCOUNTER — Other Ambulatory Visit (HOSPITAL_BASED_OUTPATIENT_CLINIC_OR_DEPARTMENT_OTHER): Payer: Medicare Other | Admitting: Lab

## 2011-07-09 ENCOUNTER — Other Ambulatory Visit: Payer: Self-pay | Admitting: Internal Medicine

## 2011-07-09 VITALS — BP 103/70 | HR 62 | Temp 97.9°F

## 2011-07-09 DIAGNOSIS — C8590 Non-Hodgkin lymphoma, unspecified, unspecified site: Secondary | ICD-10-CM

## 2011-07-09 DIAGNOSIS — C8588 Other specified types of non-Hodgkin lymphoma, lymph nodes of multiple sites: Secondary | ICD-10-CM

## 2011-07-09 DIAGNOSIS — C8589 Other specified types of non-Hodgkin lymphoma, extranodal and solid organ sites: Secondary | ICD-10-CM

## 2011-07-09 DIAGNOSIS — Z5112 Encounter for antineoplastic immunotherapy: Secondary | ICD-10-CM

## 2011-07-09 LAB — COMPREHENSIVE METABOLIC PANEL
Alkaline Phosphatase: 81 U/L (ref 39–117)
BUN: 18 mg/dL (ref 6–23)
Creatinine, Ser: 0.96 mg/dL (ref 0.50–1.35)
Glucose, Bld: 100 mg/dL — ABNORMAL HIGH (ref 70–99)
Sodium: 142 mEq/L (ref 135–145)
Total Bilirubin: 0.5 mg/dL (ref 0.3–1.2)

## 2011-07-09 LAB — CBC WITH DIFFERENTIAL/PLATELET
Basophils Absolute: 0 10*3/uL (ref 0.0–0.1)
Eosinophils Absolute: 0.4 10*3/uL (ref 0.0–0.5)
HCT: 40.7 % (ref 38.4–49.9)
HGB: 14.2 g/dL (ref 13.0–17.1)
LYMPH%: 26.5 % (ref 14.0–49.0)
MCV: 85.7 fL (ref 79.3–98.0)
MONO#: 1 10*3/uL — ABNORMAL HIGH (ref 0.1–0.9)
MONO%: 22.8 % — ABNORMAL HIGH (ref 0.0–14.0)
NEUT#: 1.7 10*3/uL (ref 1.5–6.5)
Platelets: 149 10*3/uL (ref 140–400)

## 2011-07-09 MED ORDER — ONDANSETRON 8 MG/50ML IVPB (CHCC)
8.0000 mg | Freq: Once | INTRAVENOUS | Status: AC
  Administered 2011-07-09: 8 mg via INTRAVENOUS

## 2011-07-09 MED ORDER — DEXAMETHASONE SODIUM PHOSPHATE 10 MG/ML IJ SOLN
10.0000 mg | Freq: Once | INTRAMUSCULAR | Status: AC
  Administered 2011-07-09: 10 mg via INTRAVENOUS

## 2011-07-09 MED ORDER — SODIUM CHLORIDE 0.9 % IV SOLN
90.0000 mg/m2 | Freq: Once | INTRAVENOUS | Status: AC
  Administered 2011-07-09: 165 mg via INTRAVENOUS
  Filled 2011-07-09: qty 33

## 2011-07-09 MED ORDER — DIPHENHYDRAMINE HCL 25 MG PO CAPS
50.0000 mg | ORAL_CAPSULE | Freq: Once | ORAL | Status: AC
  Administered 2011-07-09: 50 mg via ORAL

## 2011-07-09 MED ORDER — SODIUM CHLORIDE 0.9 % IV SOLN
375.0000 mg/m2 | Freq: Once | INTRAVENOUS | Status: AC
  Administered 2011-07-09: 700 mg via INTRAVENOUS
  Filled 2011-07-09: qty 70

## 2011-07-09 MED ORDER — SODIUM CHLORIDE 0.9 % IV SOLN
Freq: Once | INTRAVENOUS | Status: AC
  Administered 2011-07-09: 10:00:00 via INTRAVENOUS

## 2011-07-09 MED ORDER — ACETAMINOPHEN 325 MG PO TABS
650.0000 mg | ORAL_TABLET | Freq: Once | ORAL | Status: AC
  Administered 2011-07-09: 650 mg via ORAL

## 2011-07-10 ENCOUNTER — Ambulatory Visit (HOSPITAL_BASED_OUTPATIENT_CLINIC_OR_DEPARTMENT_OTHER): Payer: Medicare Other

## 2011-07-10 DIAGNOSIS — Z5111 Encounter for antineoplastic chemotherapy: Secondary | ICD-10-CM

## 2011-07-10 DIAGNOSIS — C8590 Non-Hodgkin lymphoma, unspecified, unspecified site: Secondary | ICD-10-CM

## 2011-07-10 DIAGNOSIS — C8589 Other specified types of non-Hodgkin lymphoma, extranodal and solid organ sites: Secondary | ICD-10-CM

## 2011-07-10 MED ORDER — SODIUM CHLORIDE 0.9 % IV SOLN
Freq: Once | INTRAVENOUS | Status: AC
  Administered 2011-07-10: 14:00:00 via INTRAVENOUS

## 2011-07-10 MED ORDER — DEXAMETHASONE SODIUM PHOSPHATE 10 MG/ML IJ SOLN
10.0000 mg | Freq: Once | INTRAMUSCULAR | Status: AC
  Administered 2011-07-10: 10 mg via INTRAVENOUS

## 2011-07-10 MED ORDER — SODIUM CHLORIDE 0.9 % IV SOLN
90.0000 mg/m2 | Freq: Once | INTRAVENOUS | Status: AC
  Administered 2011-07-10: 165 mg via INTRAVENOUS
  Filled 2011-07-10: qty 33

## 2011-07-10 MED ORDER — ONDANSETRON 8 MG/50ML IVPB (CHCC)
8.0000 mg | Freq: Once | INTRAVENOUS | Status: AC
  Administered 2011-07-10: 8 mg via INTRAVENOUS

## 2011-07-10 NOTE — Patient Instructions (Signed)
[facility] Discharge Instructions for Patients Receiving Chemotherapy  Today you received the following chemotherapy agents Treanda To help prevent nausea and vomiting after your treatment, we encourage you to take your nausea medication as prescribed.If you develop nausea and vomiting that is not controlled by your nausea medication, call the clinic. If it is after clinic hours your family physician or the after hours number for the clinic or go to the Emergency Department.   BELOW ARE SYMPTOMS THAT SHOULD BE REPORTED IMMEDIATELY:  *FEVER GREATER THAN 100.5 F  *CHILLS WITH OR WITHOUT FEVER  NAUSEA AND VOMITING THAT IS NOT CONTROLLED WITH YOUR NAUSEA MEDICATION  *UNUSUAL SHORTNESS OF BREATH  *UNUSUAL BRUISING OR BLEEDING  TENDERNESS IN MOUTH AND THROAT WITH OR WITHOUT PRESENCE OF ULCERS  *URINARY PROBLEMS  *BOWEL PROBLEMS  UNUSUAL RASH Items with * indicate a potential emergency and should be followed up as soon as possible.  One of the nurses will contact you 24 hours after your treatment. Please let the nurse know about any problems that you may have experienced. Feel free to call the clinic you have any questions or concerns. The clinic phone number is [phone].   I have been informed and understand all the instructions given to me. I know to contact the clinic, my physician, or go to the Emergency Department if any problems should occur. I do not have any questions at this time, but understand that I may call the clinic during office hours   should I have any questions or need assistance in obtaining follow up care.    __________________________________________  _____________  __________ Signature of Patient or Authorized Representative            Date                   Time    __________________________________________ Nurse's Signature    

## 2011-07-16 ENCOUNTER — Other Ambulatory Visit (HOSPITAL_BASED_OUTPATIENT_CLINIC_OR_DEPARTMENT_OTHER): Payer: Medicare Other | Admitting: Lab

## 2011-07-16 DIAGNOSIS — C8589 Other specified types of non-Hodgkin lymphoma, extranodal and solid organ sites: Secondary | ICD-10-CM

## 2011-07-16 DIAGNOSIS — C8590 Non-Hodgkin lymphoma, unspecified, unspecified site: Secondary | ICD-10-CM

## 2011-07-16 LAB — CBC WITH DIFFERENTIAL/PLATELET
BASO%: 0.5 % (ref 0.0–2.0)
Basophils Absolute: 0 10*3/uL (ref 0.0–0.1)
Eosinophils Absolute: 0.3 10*3/uL (ref 0.0–0.5)
HCT: 39.6 % (ref 38.4–49.9)
HGB: 13.5 g/dL (ref 13.0–17.1)
MCHC: 34 g/dL (ref 32.0–36.0)
MONO#: 1 10*3/uL — ABNORMAL HIGH (ref 0.1–0.9)
NEUT#: 3 10*3/uL (ref 1.5–6.5)
NEUT%: 60.5 % (ref 39.0–75.0)
WBC: 4.9 10*3/uL (ref 4.0–10.3)
lymph#: 0.6 10*3/uL — ABNORMAL LOW (ref 0.9–3.3)

## 2011-07-16 LAB — COMPREHENSIVE METABOLIC PANEL
BUN: 12 mg/dL (ref 6–23)
CO2: 31 mEq/L (ref 19–32)
Calcium: 8.8 mg/dL (ref 8.4–10.5)
Chloride: 106 mEq/L (ref 96–112)
Creatinine, Ser: 1.01 mg/dL (ref 0.50–1.35)

## 2011-07-23 ENCOUNTER — Other Ambulatory Visit (HOSPITAL_BASED_OUTPATIENT_CLINIC_OR_DEPARTMENT_OTHER): Payer: Medicare Other | Admitting: Lab

## 2011-07-23 DIAGNOSIS — C8589 Other specified types of non-Hodgkin lymphoma, extranodal and solid organ sites: Secondary | ICD-10-CM

## 2011-07-23 DIAGNOSIS — C8590 Non-Hodgkin lymphoma, unspecified, unspecified site: Secondary | ICD-10-CM

## 2011-07-23 LAB — CBC WITH DIFFERENTIAL/PLATELET
BASO%: 0.6 % (ref 0.0–2.0)
EOS%: 3 % (ref 0.0–7.0)
Eosinophils Absolute: 0.1 10*3/uL (ref 0.0–0.5)
MCH: 30.5 pg (ref 27.2–33.4)
MCV: 89.1 fL (ref 79.3–98.0)
MONO%: 15.6 % — ABNORMAL HIGH (ref 0.0–14.0)
NEUT#: 2.1 10*3/uL (ref 1.5–6.5)
RBC: 4.42 10*6/uL (ref 4.20–5.82)
RDW: 14.1 % (ref 11.0–14.6)
nRBC: 0 % (ref 0–0)

## 2011-07-23 LAB — COMPREHENSIVE METABOLIC PANEL
Albumin: 3.8 g/dL (ref 3.5–5.2)
Alkaline Phosphatase: 85 U/L (ref 39–117)
Glucose, Bld: 102 mg/dL — ABNORMAL HIGH (ref 70–99)
Potassium: 3.7 mEq/L (ref 3.5–5.3)
Sodium: 143 mEq/L (ref 135–145)
Total Protein: 5.6 g/dL — ABNORMAL LOW (ref 6.0–8.3)

## 2011-08-06 ENCOUNTER — Ambulatory Visit (HOSPITAL_BASED_OUTPATIENT_CLINIC_OR_DEPARTMENT_OTHER): Payer: Medicare Other | Admitting: Physician Assistant

## 2011-08-06 ENCOUNTER — Ambulatory Visit (HOSPITAL_BASED_OUTPATIENT_CLINIC_OR_DEPARTMENT_OTHER): Payer: Medicare Other

## 2011-08-06 ENCOUNTER — Telehealth: Payer: Self-pay | Admitting: Internal Medicine

## 2011-08-06 ENCOUNTER — Other Ambulatory Visit: Payer: BC Managed Care – PPO | Admitting: Lab

## 2011-08-06 ENCOUNTER — Ambulatory Visit: Payer: BC Managed Care – PPO

## 2011-08-06 ENCOUNTER — Encounter: Payer: Self-pay | Admitting: Physician Assistant

## 2011-08-06 ENCOUNTER — Other Ambulatory Visit (HOSPITAL_BASED_OUTPATIENT_CLINIC_OR_DEPARTMENT_OTHER): Payer: Medicare Other | Admitting: Lab

## 2011-08-06 VITALS — BP 115/73 | HR 64 | Temp 97.3°F | Ht 66.0 in | Wt 150.3 lb

## 2011-08-06 VITALS — BP 119/71 | HR 64 | Temp 98.5°F

## 2011-08-06 DIAGNOSIS — C8589 Other specified types of non-Hodgkin lymphoma, extranodal and solid organ sites: Secondary | ICD-10-CM

## 2011-08-06 DIAGNOSIS — Z5111 Encounter for antineoplastic chemotherapy: Secondary | ICD-10-CM

## 2011-08-06 DIAGNOSIS — C8590 Non-Hodgkin lymphoma, unspecified, unspecified site: Secondary | ICD-10-CM

## 2011-08-06 DIAGNOSIS — G4700 Insomnia, unspecified: Secondary | ICD-10-CM

## 2011-08-06 DIAGNOSIS — Z5112 Encounter for antineoplastic immunotherapy: Secondary | ICD-10-CM

## 2011-08-06 LAB — CBC WITH DIFFERENTIAL/PLATELET
BASO%: 0.9 % (ref 0.0–2.0)
Basophils Absolute: 0.1 10*3/uL (ref 0.0–0.1)
EOS%: 4.4 % (ref 0.0–7.0)
HCT: 39 % (ref 38.4–49.9)
LYMPH%: 27.8 % (ref 14.0–49.0)
MCH: 30.4 pg (ref 27.2–33.4)
MCHC: 35.1 g/dL (ref 32.0–36.0)
NEUT%: 44 % (ref 39.0–75.0)
Platelets: 193 10*3/uL (ref 140–400)

## 2011-08-06 LAB — LACTATE DEHYDROGENASE: LDH: 150 U/L (ref 94–250)

## 2011-08-06 LAB — COMPREHENSIVE METABOLIC PANEL
ALT: 15 U/L (ref 0–53)
AST: 17 U/L (ref 0–37)
Alkaline Phosphatase: 83 U/L (ref 39–117)
Creatinine, Ser: 0.95 mg/dL (ref 0.50–1.35)
Total Bilirubin: 0.5 mg/dL (ref 0.3–1.2)

## 2011-08-06 MED ORDER — ONDANSETRON 8 MG/50ML IVPB (CHCC)
8.0000 mg | Freq: Once | INTRAVENOUS | Status: AC
  Administered 2011-08-06: 8 mg via INTRAVENOUS

## 2011-08-06 MED ORDER — SODIUM CHLORIDE 0.9 % IV SOLN
375.0000 mg/m2 | Freq: Once | INTRAVENOUS | Status: AC
  Administered 2011-08-06: 700 mg via INTRAVENOUS
  Filled 2011-08-06: qty 70

## 2011-08-06 MED ORDER — SODIUM CHLORIDE 0.9 % IV SOLN
90.0000 mg/m2 | Freq: Once | INTRAVENOUS | Status: AC
  Administered 2011-08-06: 165 mg via INTRAVENOUS
  Filled 2011-08-06: qty 33

## 2011-08-06 MED ORDER — ZOLPIDEM TARTRATE 10 MG PO TABS: ORAL_TABLET | ORAL | Status: DC

## 2011-08-06 MED ORDER — SODIUM CHLORIDE 0.9 % IV SOLN
Freq: Once | INTRAVENOUS | Status: AC
  Administered 2011-08-06: 11:00:00 via INTRAVENOUS

## 2011-08-06 MED ORDER — DEXAMETHASONE SODIUM PHOSPHATE 10 MG/ML IJ SOLN
10.0000 mg | Freq: Once | INTRAMUSCULAR | Status: AC
  Administered 2011-08-06: 10 mg via INTRAVENOUS

## 2011-08-06 MED ORDER — DIPHENHYDRAMINE HCL 25 MG PO CAPS
50.0000 mg | ORAL_CAPSULE | Freq: Once | ORAL | Status: AC
  Administered 2011-08-06: 50 mg via ORAL

## 2011-08-06 MED ORDER — ACETAMINOPHEN 325 MG PO TABS
650.0000 mg | ORAL_TABLET | Freq: Once | ORAL | Status: AC
  Administered 2011-08-06: 650 mg via ORAL

## 2011-08-06 NOTE — Telephone Encounter (Signed)
appts made and printed for pt aom °

## 2011-08-06 NOTE — Patient Instructions (Signed)
[facility] Discharge Instructions for Patients Receiving Chemotherapy  Today you received the following chemotherapy agents Rituxan and Treanda  To help prevent nausea and vomiting after your treatment, we encourage you to take your nausea medication.   If you develop nausea and vomiting that is not controlled by your nausea medication, call the clinic. If it is after clinic hours your family physician or the after hours number for the clinic or go to the Emergency Department.   BELOW ARE SYMPTOMS THAT SHOULD BE REPORTED IMMEDIATELY:  *FEVER GREATER THAN 100.5 F  *CHILLS WITH OR WITHOUT FEVER  NAUSEA AND VOMITING THAT IS NOT CONTROLLED WITH YOUR NAUSEA MEDICATION  *UNUSUAL SHORTNESS OF BREATH  *UNUSUAL BRUISING OR BLEEDING  TENDERNESS IN MOUTH AND THROAT WITH OR WITHOUT PRESENCE OF ULCERS  *URINARY PROBLEMS  *BOWEL PROBLEMS  UNUSUAL RASH Items with * indicate a potential emergency and should be followed up as soon as possible.  One of the nurses will contact you 24 hours after your treatment. Please let the nurse know about any problems that you may have experienced. Feel free to call the clinic you have any questions or concerns. The clinic phone number is [phone].   I have been informed and understand all the instructions given to me. I know to contact the clinic, my physician, or go to the Emergency Department if any problems should occur. I do not have any questions at this time, but understand that I may call the clinic during office hours   should I have any questions or need assistance in obtaining follow up care.    __________________________________________  _____________  __________ Signature of Patient or Authorized Representative            Date                   Time    __________________________________________ Nurse's Signature    

## 2011-08-07 ENCOUNTER — Ambulatory Visit (HOSPITAL_BASED_OUTPATIENT_CLINIC_OR_DEPARTMENT_OTHER): Payer: Medicare Other

## 2011-08-07 VITALS — BP 134/66 | HR 72 | Temp 98.2°F

## 2011-08-07 DIAGNOSIS — Z5111 Encounter for antineoplastic chemotherapy: Secondary | ICD-10-CM

## 2011-08-07 DIAGNOSIS — C8589 Other specified types of non-Hodgkin lymphoma, extranodal and solid organ sites: Secondary | ICD-10-CM

## 2011-08-07 DIAGNOSIS — C8590 Non-Hodgkin lymphoma, unspecified, unspecified site: Secondary | ICD-10-CM

## 2011-08-07 MED ORDER — SODIUM CHLORIDE 0.9 % IV SOLN
90.0000 mg/m2 | Freq: Once | INTRAVENOUS | Status: AC
  Administered 2011-08-07: 165 mg via INTRAVENOUS
  Filled 2011-08-07: qty 33

## 2011-08-07 MED ORDER — ONDANSETRON 8 MG/50ML IVPB (CHCC)
8.0000 mg | Freq: Once | INTRAVENOUS | Status: AC
  Administered 2011-08-07: 8 mg via INTRAVENOUS

## 2011-08-07 MED ORDER — DEXAMETHASONE SODIUM PHOSPHATE 10 MG/ML IJ SOLN
10.0000 mg | Freq: Once | INTRAMUSCULAR | Status: AC
  Administered 2011-08-07: 10 mg via INTRAVENOUS

## 2011-08-07 MED ORDER — SODIUM CHLORIDE 0.9 % IV SOLN
Freq: Once | INTRAVENOUS | Status: AC
  Administered 2011-08-07: 15:00:00 via INTRAVENOUS

## 2011-08-07 NOTE — Patient Instructions (Signed)
[facility] Discharge Instructions for Patients Receiving Chemotherapy  Today you received the following chemotherapy agents TREANDA  To help prevent nausea and vomiting after your treatment, we encourage you to take your nausea medication as directed by your provider  If you develop nausea and vomiting that is not controlled by your nausea medication, call the clinic. If it is after clinic hours your family physician or the after hours number for the clinic or go to the Emergency Department.   BELOW ARE SYMPTOMS THAT SHOULD BE REPORTED IMMEDIATELY:  *FEVER GREATER THAN 100.5 F  *CHILLS WITH OR WITHOUT FEVER  NAUSEA AND VOMITING THAT IS NOT CONTROLLED WITH YOUR NAUSEA MEDICATION  *UNUSUAL SHORTNESS OF BREATH  *UNUSUAL BRUISING OR BLEEDING  TENDERNESS IN MOUTH AND THROAT WITH OR WITHOUT PRESENCE OF ULCERS  *URINARY PROBLEMS  *BOWEL PROBLEMS  UNUSUAL RASH Items with * indicate a potential emergency and should be followed up as soon as possible.  One of the nurses will contact you 24 hours after your treatment. Please let the nurse know about any problems that you may have experienced. Feel free to call the clinic you have any questions or concerns. The clinic phone number is [phone].   I have been informed and understand all the instructions given to me. I know to contact the clinic, my physician, or go to the Emergency Department if any problems should occur. I do not have any questions at this time, but understand that I may call the clinic during office hours   should I have any questions or need assistance in obtaining follow up care.    __________________________________________  _____________  __________ Signature of Patient or Authorized Representative            Date                   Time    __________________________________________ Nurse's Signature

## 2011-08-09 NOTE — Progress Notes (Signed)
[facility] [phone]   Fax:([phone]  OFFICE PROGRESS NOTE  GUEST, [name], MD, MD [address] [place] [address]  PRINCIPAL DIAGNOSIS: Bulky stage IV low grade lymphoma diagnosed in November 2009.   PRIOR THERAPY:  1. Status post 7 cycles of systemic chemotherapy with CHOP/Rituxan. Last dose was given May 2010. 2. Status post 6 cycles of maintenance Rituxan 375 mg/sq m given every 2 months. Last dose was given on May 29, 2010, and the patient was lost to followup at that time. He received another dose on 12/11/2010, then again missed 2 doses.  CURRENT THERAPY:  systemic chemotherapy with Rituxan 375 mg/M2 on day 1 and that bendamustine 90 mg/M2 on days 1 and 2 every 4 weeks, status post 2 cycles.  INTERVAL HISTORY: Eric [name] 71 y.o. male returns to the clinic today for followup visit. He tolerated his first 2 cycles without any significant difficulty. Today he feels fine he voices no complaints.  He denied any recent night sweats no weight loss, denied chest pain shortness of breath no cough or hemoptysis. He is due for his third cycle of systemic chemotherapy with Rituxan and bendamustine. He is having some difficulty sleeping and requests a prescription for Ambien.  ALLERGIES:  is allergic to neosporin.  MEDICATIONS:  Current Outpatient Prescriptions  Medication Sig Dispense Refill  . cyanocobalamin 1000 MCG tablet Take 100 mcg by mouth daily.      . ferrous sulfate 325 (65 FE) MG tablet Take 325 mg by mouth daily with breakfast.      . omeprazole (PRILOSEC) 40 MG capsule Take 40 mg by mouth daily.      . ranitidine (ZANTAC) 150 MG tablet Take 150 mg by mouth 2 (two) times daily. Alternates with omeperazole      . zolpidem (AMBIEN) 10 MG tablet Take 1/2 to 1 tablet by mouth at bedtime as needed for sleep  30 tablet  0  . zolpidem (AMBIEN) 5 MG tablet Take 2 tablets (10 mg total) by mouth at bedtime as needed.  30 tablet  0     REVIEW OF SYSTEMS:  A comprehensive review of systems was negative.   PHYSICAL EXAMINATION: General appearance: alert, cooperative and no distress Neck: palpable left cervical LNs Lymph nodes: Cervical adenopathy: , Axillary adenopathy: , Supraclavicular adenopathy:  and Inguinal adenopathy:  Resp: clear to auscultation bilaterally Cardio: regular rate and rhythm, S1, S2 normal, no murmur, click, rub or gallop GI: soft, non-tender; bowel sounds normal; no masses,  no organomegaly Extremities: extremities normal, atraumatic, no cyanosis or edema Neurologic: Alert and oriented X 3, normal strength and tone. Normal symmetric reflexes. Normal coordination and gait  ECOG PERFORMANCE STATUS: 1 - Symptomatic but completely ambulatory  Blood pressure 115/73, pulse 64, temperature 97.3 F (36.3 C), temperature source Oral, height 5\' 6"  (1.676 m), weight 150 lb 4.8 oz (68.176 kg).  LABORATORY DATA: Lab Results  Component Value Date   WBC 5.5 08/06/2011   HGB 13.7 08/06/2011   HCT 39.0 08/06/2011   MCV 86.7 08/06/2011   PLT 193 08/06/2011      Chemistry      Component Value Date/Time   NA 143 08/06/2011 0924   NA 141 03/23/2010 1301   K 4.0 08/06/2011 0924   K 4.7 03/23/2010 1301   CL 106 08/06/2011 0924   CL 99 03/23/2010 1301   CO2 28 08/06/2011 0924   CO2 26 03/23/2010 1301   BUN 11 08/06/2011  0924   BUN 12 03/23/2010 1301   CREATININE 0.95 08/06/2011 0924   CREATININE 1.0 03/23/2010 1301      Component Value Date/Time   CALCIUM 8.9 08/06/2011 0924   CALCIUM 9.1 03/23/2010 1301   ALKPHOS 83 08/06/2011 0924   ALKPHOS 78 03/23/2010 1301   AST 17 08/06/2011 0924   AST 25 03/23/2010 1301   ALT 15 08/06/2011 0924   BILITOT 0.5 08/06/2011 0924   BILITOT 0.60 03/23/2010 1301       RADIOGRAPHIC STUDIES: Ct Soft Tissue Neck W Contrast  06/04/2011  *RADIOLOGY REPORT*  Clinical Data:  Lymphoma restaging  CT NECK, CHEST, ABDOMEN AND PELVIS WITH CONTRAST  Technique:  Multidetector CT imaging of the neck, chest,  abdomen and pelvis was performed using the standard protocol following the bolus administration of intravenous contrast.  Contrast: OMNIPAQUE IOHEXOL 300 MG/ML IJ SOLN  Comparison:  11/28/2010   CT NECK  Findings:  The visualized intracranial contents appear normal.  The parotid and submandibular glands are within normal limits.  Retention cyst versus polyp is noted within the right maxillary sinus.  The mastoid air cells appear clear.  There is mild multilevel spondylosis within the thoracic spine.  Bilateral cervical adenopathy is again identified.  Left posterior cervical node measures 1.5 cm, image 51.  Previously this measured 0.8 cm.  Right posterior cervical lymph node measures 1.3 cm, image 39. Previously this measured 0.8 cm.  No enhancing masses identified.  The thyroid gland appears normal.  Review of the visualized bony structures is significant for mild spondylosis.  IMPRESSION:  1.  Interval progression of bilateral cervical adenopathy   CT CHEST  Findings:  Progression of bilateral supraclavicular adenopathy.  Lymph node on the left measures 1 cm, image #2.  Previously this node measured 0.7 cm.  Progression of bulky bilateral axillary adenopathy.  Large left axillary lymph node measures 2.6 cm, image number 13.  Previously this measured 0.9 cm.  Right axillary lymph node measures 1.4 cm, image 12.  Previously this measured 1.1 cm.  Progression of mediastinal and bilateral hilar adenopathy.  For example, subcarinal lymph node measures 1.8 cm, image 30. Previously this measured 0.8 cm.  No pericardial or pleural effusions.  The lungs appear clear.  There is no airspace consolidation.  No suspicious pulmonary parenchymal nodule or mass.  The visualized osseous structures are unremarkable.  No aggressive bone lesions noted.  IMPRESSION:  1.  Interval progression of mediastinal, axillary and supraclavicular adenopathy.   CT ABDOMEN AND PELVIS  Findings: The spleen appears normal.  Both of the  adrenal glands are normal.  Normal appearance of the liver.  The gallbladder appears normal.  There is no biliary dilatation. Pancreas is normal.  Bilateral renal cysts are identified and appears similar to previous exam.  There has been significant progression of upper abdominal adenopathy.  Lymph node mass at the level of the left renal vein measures 9.1 x 5.5 cm, image number 64.  Previously this measured 5.7 x 2.9 cm.  At the level of the aortic bifurcation there is a right common iliac node which measures 3.5 cm, image 84.  Previously 1.7 cm.  Extensive bilateral iliac adenopathy is present.  Left external iliac lymph node measures 3.6 cm, image 111.  Previously 1.6 cm.  There is marked enlargement of the prostate gland which has mass effect upon the bladder base.  The patient has a small hiatal hernia.  The stomach and small bowel loops have a normal course  and caliber.  The appendix is identified and appears normal.  There are multiple sigmoid diverticula without acute inflammation.  Review of the visualized osseous structures is significant for mild spondylosis.  IMPRESSION:  1.  Significant progression of abdominal and pelvic adenopathy.  Original Report Authenticated By: Rosealee Albee, M.D.    ASSESSMENT: This is a very pleasant 71 years old American male with recurrent low-grade lymphoma. He was treated in the past with CHOP/Rituxan with significant response followed by maintenance Rituxan but he was not compliant with his treatment. His most recent restaging CT scan revealed progression of lymphadenopathy. He is currently being treated with systemic chemotherapy in the form of Rituxan at 375 mg per meter squared on day 1 and Bendamustine 90 mg per meter square given on days 1-2 every 4 weeks, status post 2 cycles. Overall he is tolerated this therapy without significant difficulty. Patient was discussed with Dr. Arbutus Ped. He'll proceed with his third cycle of systemic chemotherapy with Rituxan and  bendamustine as scheduled. He is given a prescription for Ambien 10 mg tablets, 1/2 to 1 tablet by mouth at bedtime as needed for sleep, a total of 30 tablets with no refill. He'll return in one month with a repeat CBC differential C. met and LDH, as well as a CT of the chest, abdomen and pelvis to reevaluate his disease prior to cycle #4 of his systemic chemotherapy with Rituxan and bendamustine.   Laural Benes, Cyann Venti E, PA-C   All questions were answered. The patient knows to call the clinic with any problems, questions or concerns. We can certainly see the patient much sooner if necessary.

## 2011-08-13 ENCOUNTER — Other Ambulatory Visit (HOSPITAL_BASED_OUTPATIENT_CLINIC_OR_DEPARTMENT_OTHER): Payer: Medicare Other | Admitting: Lab

## 2011-08-13 DIAGNOSIS — C8590 Non-Hodgkin lymphoma, unspecified, unspecified site: Secondary | ICD-10-CM

## 2011-08-13 DIAGNOSIS — C8589 Other specified types of non-Hodgkin lymphoma, extranodal and solid organ sites: Secondary | ICD-10-CM

## 2011-08-13 LAB — CBC WITH DIFFERENTIAL/PLATELET
BASO%: 0.2 % (ref 0.0–2.0)
EOS%: 4.2 % (ref 0.0–7.0)
LYMPH%: 9.3 % — ABNORMAL LOW (ref 14.0–49.0)
MCH: 30.9 pg (ref 27.2–33.4)
MCHC: 34.5 g/dL (ref 32.0–36.0)
MCV: 89.6 fL (ref 79.3–98.0)
MONO%: 19.7 % — ABNORMAL HIGH (ref 0.0–14.0)
NEUT%: 66.6 % (ref 39.0–75.0)
Platelets: 202 10*3/uL (ref 140–400)
RBC: 4.46 10*6/uL (ref 4.20–5.82)
WBC: 4.2 10*3/uL (ref 4.0–10.3)

## 2011-08-13 LAB — LACTATE DEHYDROGENASE: LDH: 140 U/L (ref 94–250)

## 2011-08-13 LAB — COMPREHENSIVE METABOLIC PANEL
ALT: 10 U/L (ref 0–53)
AST: 14 U/L (ref 0–37)
Alkaline Phosphatase: 81 U/L (ref 39–117)
CO2: 27 mEq/L (ref 19–32)
Sodium: 142 mEq/L (ref 135–145)
Total Bilirubin: 0.5 mg/dL (ref 0.3–1.2)
Total Protein: 5.4 g/dL — ABNORMAL LOW (ref 6.0–8.3)

## 2011-08-20 ENCOUNTER — Other Ambulatory Visit (HOSPITAL_BASED_OUTPATIENT_CLINIC_OR_DEPARTMENT_OTHER): Payer: Medicare Other | Admitting: Lab

## 2011-08-20 DIAGNOSIS — C8589 Other specified types of non-Hodgkin lymphoma, extranodal and solid organ sites: Secondary | ICD-10-CM

## 2011-08-20 DIAGNOSIS — C8590 Non-Hodgkin lymphoma, unspecified, unspecified site: Secondary | ICD-10-CM

## 2011-08-20 LAB — CBC WITH DIFFERENTIAL/PLATELET
BASO%: 0.6 % (ref 0.0–2.0)
Basophils Absolute: 0 10*3/uL (ref 0.0–0.1)
HCT: 40.6 % (ref 38.4–49.9)
HGB: 14 g/dL (ref 13.0–17.1)
LYMPH%: 17.6 % (ref 14.0–49.0)
MCH: 30.8 pg (ref 27.2–33.4)
MCHC: 34.4 g/dL (ref 32.0–36.0)
MONO#: 0.9 10*3/uL (ref 0.1–0.9)
NEUT%: 62.7 % (ref 39.0–75.0)
Platelets: 233 10*3/uL (ref 140–400)
WBC: 4.9 10*3/uL (ref 4.0–10.3)
lymph#: 0.9 10*3/uL (ref 0.9–3.3)

## 2011-08-20 LAB — COMPREHENSIVE METABOLIC PANEL
ALT: 11 U/L (ref 0–53)
AST: 17 U/L (ref 0–37)
BUN: 11 mg/dL (ref 6–23)
Creatinine, Ser: 0.82 mg/dL (ref 0.50–1.35)
Total Bilirubin: 0.5 mg/dL (ref 0.3–1.2)

## 2011-08-20 LAB — LACTATE DEHYDROGENASE: LDH: 159 U/L (ref 94–250)

## 2011-08-27 ENCOUNTER — Other Ambulatory Visit (HOSPITAL_BASED_OUTPATIENT_CLINIC_OR_DEPARTMENT_OTHER): Payer: Medicare Other | Admitting: Lab

## 2011-08-27 DIAGNOSIS — C8590 Non-Hodgkin lymphoma, unspecified, unspecified site: Secondary | ICD-10-CM

## 2011-08-27 DIAGNOSIS — C8589 Other specified types of non-Hodgkin lymphoma, extranodal and solid organ sites: Secondary | ICD-10-CM

## 2011-08-27 LAB — CBC WITH DIFFERENTIAL/PLATELET
Basophils Absolute: 0 10*3/uL (ref 0.0–0.1)
Eosinophils Absolute: 0 10*3/uL (ref 0.0–0.5)
HCT: 40.7 % (ref 38.4–49.9)
HGB: 14.1 g/dL (ref 13.0–17.1)
MCH: 31 pg (ref 27.2–33.4)
MONO#: 0.9 10*3/uL (ref 0.1–0.9)
NEUT#: 2.1 10*3/uL (ref 1.5–6.5)
NEUT%: 50.2 % (ref 39.0–75.0)
RDW: 14.4 % (ref 11.0–14.6)
WBC: 4.2 10*3/uL (ref 4.0–10.3)
lymph#: 1.1 10*3/uL (ref 0.9–3.3)

## 2011-08-27 LAB — COMPREHENSIVE METABOLIC PANEL
AST: 21 U/L (ref 0–37)
Albumin: 4.2 g/dL (ref 3.5–5.2)
BUN: 12 mg/dL (ref 6–23)
Calcium: 9 mg/dL (ref 8.4–10.5)
Chloride: 108 mEq/L (ref 96–112)
Creatinine, Ser: 0.94 mg/dL (ref 0.50–1.35)
Glucose, Bld: 83 mg/dL (ref 70–99)

## 2011-08-30 ENCOUNTER — Ambulatory Visit (HOSPITAL_COMMUNITY)
Admission: RE | Admit: 2011-08-30 | Discharge: 2011-08-30 | Disposition: A | Discharge status: Home / Self Care | Payer: Medicare Other | Source: Ambulatory Visit | Attending: Physician Assistant | Admitting: Physician Assistant

## 2011-08-30 DIAGNOSIS — Z79899 Other long term (current) drug therapy: Secondary | ICD-10-CM | POA: Insufficient documentation

## 2011-08-30 DIAGNOSIS — C8590 Non-Hodgkin lymphoma, unspecified, unspecified site: Secondary | ICD-10-CM

## 2011-08-30 DIAGNOSIS — K5730 Diverticulosis of large intestine without perforation or abscess without bleeding: Secondary | ICD-10-CM | POA: Insufficient documentation

## 2011-08-30 DIAGNOSIS — C8589 Other specified types of non-Hodgkin lymphoma, extranodal and solid organ sites: Secondary | ICD-10-CM | POA: Insufficient documentation

## 2011-08-30 DIAGNOSIS — K449 Diaphragmatic hernia without obstruction or gangrene: Secondary | ICD-10-CM | POA: Insufficient documentation

## 2011-08-30 DIAGNOSIS — N281 Cyst of kidney, acquired: Secondary | ICD-10-CM | POA: Insufficient documentation

## 2011-08-30 DIAGNOSIS — N400 Enlarged prostate without lower urinary tract symptoms: Secondary | ICD-10-CM | POA: Insufficient documentation

## 2011-08-30 DIAGNOSIS — I2510 Atherosclerotic heart disease of native coronary artery without angina pectoris: Secondary | ICD-10-CM | POA: Insufficient documentation

## 2011-08-30 MED ORDER — IOHEXOL 300 MG/ML  SOLN
100.0000 mL | Freq: Once | INTRAMUSCULAR | Status: AC | PRN
  Administered 2011-08-30: 100 mL via INTRAVENOUS

## 2011-09-03 ENCOUNTER — Ambulatory Visit (HOSPITAL_BASED_OUTPATIENT_CLINIC_OR_DEPARTMENT_OTHER): Payer: Medicare Other

## 2011-09-03 ENCOUNTER — Other Ambulatory Visit: Payer: Medicare Other | Admitting: Lab

## 2011-09-03 ENCOUNTER — Telehealth: Payer: Self-pay | Admitting: *Deleted

## 2011-09-03 ENCOUNTER — Ambulatory Visit (HOSPITAL_BASED_OUTPATIENT_CLINIC_OR_DEPARTMENT_OTHER): Payer: Medicare Other | Admitting: Internal Medicine

## 2011-09-03 ENCOUNTER — Telehealth: Payer: Self-pay | Admitting: Internal Medicine

## 2011-09-03 VITALS — BP 123/82 | HR 72 | Temp 97.5°F | Ht 66.0 in | Wt 149.1 lb

## 2011-09-03 VITALS — BP 120/72 | HR 56 | Temp 98.4°F

## 2011-09-03 DIAGNOSIS — Z5112 Encounter for antineoplastic immunotherapy: Secondary | ICD-10-CM

## 2011-09-03 DIAGNOSIS — C8590 Non-Hodgkin lymphoma, unspecified, unspecified site: Secondary | ICD-10-CM

## 2011-09-03 DIAGNOSIS — Z5111 Encounter for antineoplastic chemotherapy: Secondary | ICD-10-CM

## 2011-09-03 DIAGNOSIS — C8589 Other specified types of non-Hodgkin lymphoma, extranodal and solid organ sites: Secondary | ICD-10-CM

## 2011-09-03 LAB — CBC WITH DIFFERENTIAL/PLATELET
Basophils Absolute: 0 10*3/uL (ref 0.0–0.1)
EOS%: 4 % (ref 0.0–7.0)
Eosinophils Absolute: 0.2 10*3/uL (ref 0.0–0.5)
HGB: 14.1 g/dL (ref 13.0–17.1)
MCH: 30.6 pg (ref 27.2–33.4)
MCV: 86.8 fL (ref 79.3–98.0)
MONO%: 22 % — ABNORMAL HIGH (ref 0.0–14.0)
NEUT#: 1.9 10*3/uL (ref 1.5–6.5)
RBC: 4.61 10*6/uL (ref 4.20–5.82)
RDW: 14 % (ref 11.0–14.6)
lymph#: 1.4 10*3/uL (ref 0.9–3.3)
nRBC: 0 % (ref 0–0)

## 2011-09-03 LAB — COMPREHENSIVE METABOLIC PANEL
Albumin: 4.3 g/dL (ref 3.5–5.2)
Alkaline Phosphatase: 75 U/L (ref 39–117)
BUN: 11 mg/dL (ref 6–23)
Calcium: 9.3 mg/dL (ref 8.4–10.5)
Chloride: 106 mEq/L (ref 96–112)
Glucose, Bld: 83 mg/dL (ref 70–99)
Potassium: 4.1 mEq/L (ref 3.5–5.3)
Sodium: 141 mEq/L (ref 135–145)
Total Protein: 6.1 g/dL (ref 6.0–8.3)

## 2011-09-03 MED ORDER — SODIUM CHLORIDE 0.9 % IV SOLN
375.0000 mg/m2 | Freq: Once | INTRAVENOUS | Status: AC
  Administered 2011-09-03: 700 mg via INTRAVENOUS
  Filled 2011-09-03: qty 70

## 2011-09-03 MED ORDER — SODIUM CHLORIDE 0.9 % IV SOLN
90.0000 mg/m2 | Freq: Once | INTRAVENOUS | Status: AC
  Administered 2011-09-03: 165 mg via INTRAVENOUS
  Filled 2011-09-03: qty 33

## 2011-09-03 MED ORDER — SODIUM CHLORIDE 0.9 % IV SOLN
Freq: Once | INTRAVENOUS | Status: AC
  Administered 2011-09-03: 10:00:00 via INTRAVENOUS

## 2011-09-03 MED ORDER — DEXAMETHASONE SODIUM PHOSPHATE 10 MG/ML IJ SOLN
10.0000 mg | Freq: Once | INTRAMUSCULAR | Status: AC
  Administered 2011-09-03: 10 mg via INTRAVENOUS

## 2011-09-03 MED ORDER — SODIUM CHLORIDE 0.9 % IJ SOLN
3.0000 mL | INTRAMUSCULAR | Status: DC | PRN
  Filled 2011-09-03: qty 10

## 2011-09-03 MED ORDER — DIPHENHYDRAMINE HCL 25 MG PO CAPS
50.0000 mg | ORAL_CAPSULE | Freq: Once | ORAL | Status: AC
  Administered 2011-09-03: 50 mg via ORAL

## 2011-09-03 MED ORDER — HEPARIN SOD (PORK) LOCK FLUSH 100 UNIT/ML IV SOLN
250.0000 [IU] | Freq: Once | INTRAVENOUS | Status: DC | PRN
  Filled 2011-09-03: qty 5

## 2011-09-03 MED ORDER — SODIUM CHLORIDE 0.9 % IJ SOLN
10.0000 mL | INTRAMUSCULAR | Status: DC | PRN
  Filled 2011-09-03: qty 10

## 2011-09-03 MED ORDER — ACETAMINOPHEN 325 MG PO TABS
650.0000 mg | ORAL_TABLET | Freq: Once | ORAL | Status: AC
  Administered 2011-09-03: 650 mg via ORAL

## 2011-09-03 MED ORDER — HEPARIN SOD (PORK) LOCK FLUSH 100 UNIT/ML IV SOLN
500.0000 [IU] | Freq: Once | INTRAVENOUS | Status: DC | PRN
  Filled 2011-09-03: qty 5

## 2011-09-03 MED ORDER — ONDANSETRON 8 MG/50ML IVPB (CHCC)
8.0000 mg | Freq: Once | INTRAVENOUS | Status: AC
  Administered 2011-09-03: 8 mg via INTRAVENOUS

## 2011-09-03 MED ORDER — ALTEPLASE 2 MG IJ SOLR
2.0000 mg | Freq: Once | INTRAMUSCULAR | Status: DC | PRN
  Filled 2011-09-03: qty 2

## 2011-09-03 NOTE — Progress Notes (Signed)
[facility] Telephone:([phone]) [phone]   Fax:(336) [phone]  OFFICE PROGRESS NOTE  Tally Due, MD [address] [place] [address]  PRINCIPAL DIAGNOSIS: Bulky stage IV low grade lymphoma diagnosed in November 2009.   PRIOR THERAPY:  1. Status post 7 cycles of systemic chemotherapy with CHOP/Rituxan. Last dose was given May 2010. 2. Status post 6 cycles of maintenance Rituxan 375 mg/sq m given every 2 months. Last dose was given on May 29, 2010, and the patient was lost to followup at that time. He received another dose on 12/11/2010, then again missed 2 doses.  CURRENT THERAPY: systemic chemotherapy with Rituxan 375 mg/M2 on day 1 and that bendamustine 90 mg/M2 on days 1 and 2 every 4 weeks, status post 3 cycles.   INTERVAL HISTORY: Emet Rafanan 71 y.o. male returns to the clinic today for followup visit. The patient is tolerating his systemic chemotherapy with Glorianne [place] and Rituxan fairly well. He denied having any significant weight loss or night sweats. No nausea or vomiting. He has significant improvement in the palpable lymphadenopathy. He has repeat CT scan of the neck, chest, abdomen and pelvis performed recently and he is here today for evaluation and discussion of his scan results.  ALLERGIES:  is allergic to neosporin.  MEDICATIONS:  Current Outpatient Prescriptions  Medication Sig Dispense Refill  . cyanocobalamin 1000 MCG tablet Take 100 mcg by mouth daily.      . ferrous sulfate 325 (65 FE) MG tablet Take 325 mg by mouth daily with breakfast.      . ranitidine (ZANTAC) 150 MG tablet Take 150 mg by mouth 2 (two) times daily. Alternates with omeperazole      . zolpidem (AMBIEN) 10 MG tablet Take 1/2 to 1 tablet by mouth at bedtime as needed for sleep  30 tablet  0  . DISCONTD: zolpidem (AMBIEN) 5 MG tablet Take 2 tablets (10 mg total) by mouth at bedtime as needed.  30 tablet  0  . omeprazole (PRILOSEC) 40 MG capsule Take 40 mg by mouth daily.         REVIEW OF SYSTEMS:  A comprehensive review of systems was negative.   PHYSICAL EXAMINATION: General appearance: alert, cooperative and no distress Head: Normocephalic, without obvious abnormality, atraumatic Neck: no adenopathy Lymph nodes: Cervical, supraclavicular, and axillary nodes normal. Resp: clear to auscultation bilaterally Cardio: regular rate and rhythm, S1, S2 normal, no murmur, click, rub or gallop GI: soft, non-tender; bowel sounds normal; no masses,  no organomegaly Extremities: extremities normal, atraumatic, no cyanosis or edema Neurologic: Alert and oriented X 3, normal strength and tone. Normal symmetric reflexes. Normal coordination and gait  ECOG PERFORMANCE STATUS: 1 - Symptomatic but completely ambulatory  Blood pressure 123/82, pulse 72, temperature 97.5 F (36.4 C), temperature source Oral, height 5\' 6"  (1.676 m), weight 149 lb 1.6 oz (67.631 kg).  LABORATORY DATA: Lab Results  Component Value Date   WBC 4.5 09/03/2011   HGB 14.1 09/03/2011   HCT 40.0 09/03/2011   MCV 86.8 09/03/2011   PLT 179 09/03/2011      Chemistry      Component Value Date/Time   NA 143 08/27/2011 1350   NA 141 03/23/2010 1301   K 4.0 08/27/2011 1350   K 4.7 03/23/2010 1301   CL 108 08/27/2011 1350   CL 99 03/23/2010 1301   CO2 27 08/27/2011 1350   CO2 26 03/23/2010 1301   BUN 12 08/27/2011 1350   BUN 12  03/23/2010 1301   CREATININE 0.94 08/27/2011 1350   CREATININE 1.0 03/23/2010 1301      Component Value Date/Time   CALCIUM 9.0 08/27/2011 1350   CALCIUM 9.1 03/23/2010 1301   ALKPHOS 79 08/27/2011 1350   ALKPHOS 78 03/23/2010 1301   AST 21 08/27/2011 1350   AST 25 03/23/2010 1301   ALT 15 08/27/2011 1350   BILITOT 0.6 08/27/2011 1350   BILITOT 0.60 03/23/2010 1301       RADIOGRAPHIC STUDIES: Ct Chest W Contrast  08/30/2011  *RADIOLOGY REPORT*  Clinical Data:  Follow up of non-Hodgkins lymphoma.  Originally diagnosed in right axilla and neck.  Ongoing chemotherapy.  CT CHEST,  ABDOMEN AND PELVIS WITH CONTRAST  Technique: Contiguous axial images of the chest abdomen and pelvis were obtained after IV contrast administration.  Contrast: 100  ml Omnipaque-300  Comparison: 06/04/2011  CT CHEST  Findings: Lung windows demonstrate no nodules or airspace opacities.  Soft tissue windows demonstrate resolved supraclavicular/low cervical adenopathy.  Prior axillary nodal dissection.  Resolved left axillary adenopathy.  Normal heart size without pericardial or pleural effusion. Multivessel coronary artery atherosclerosis.  No central pulmonary embolism, on this non-dedicated study.  Resolved mediastinal adenopathy.  Right hilar nodal tissue is decreased in size.  Now upper limits of normal at 1.2 cm; 2.2 cm on the prior.  Small hiatal hernia.  Suggestion of distal esophageal wall thickening; image 45.  Resolved retrocrural adenopathy.  IMPRESSION:  1.  Marked response to therapy of thoracic and lower cervical adenopathy.  A borderline enlarged right hilar lymph node remains. 2.  Coronary artery atherosclerosis, including three-vessel disease and probable left main disease. 3.  A small hiatal hernia with suggestion of distal esophageal wall thickening/esophagitis.  CT ABDOMEN AND PELVIS  Findings:  Normal liver, spleen, stomach, pancreas, gallbladder, biliary tract, adrenal glands.  Left-sided renal sinus cysts. Dominant right renal cyst of 4 cm.  Marked improvement in retroperitoneal adenopathy.  Small nodes remaining.  The largest measures 8 mm, not pathologic by size criteria.  Extensive colonic diverticulosis. Normal terminal ileum and appendix.  Normal small bowel without abdominal ascites.  Resolution of marked mesenteric adenopathy.  Marked improvement in pelvic adenopathy.  Left external iliac node measures 1.2 cm on image 112 versus 3.4 cm on the prior (when remeasured).  The bladder is mildly thick-walled.  Marked prostatomegaly. No significant free fluid.  Fat containing ventral abdominal  wall hernia.  Bone island within the right ischium.  Minimal wedging of the T12 vertebral body is similar and without significant canal compromise.  IMPRESSION:  1.  Marked response to therapy of abdominal pelvic adenopathy. Mild residual left pelvic adenopathy remains. 2.  Marked prostatomegaly.  Bladder wall thickening which is likely secondary.  Original Report Authenticated By: Consuello Bossier, M.D.   Ct Abdomen Pelvis W Contrast  08/30/2011  *RADIOLOGY REPORT*  Clinical Data:  Follow up of non-Hodgkins lymphoma.  Originally diagnosed in right axilla and neck.  Ongoing chemotherapy.  CT CHEST, ABDOMEN AND PELVIS WITH CONTRAST  Technique: Contiguous axial images of the chest abdomen and pelvis were obtained after IV contrast administration.  Contrast: 100  ml Omnipaque-300  Comparison: 06/04/2011  CT CHEST  Findings: Lung windows demonstrate no nodules or airspace opacities.  Soft tissue windows demonstrate resolved supraclavicular/low cervical adenopathy.  Prior axillary nodal dissection.  Resolved left axillary adenopathy.  Normal heart size without pericardial or pleural effusion. Multivessel coronary artery atherosclerosis.  No central pulmonary embolism, on this non-dedicated study.  Resolved mediastinal  adenopathy.  Right hilar nodal tissue is decreased in size.  Now upper limits of normal at 1.2 cm; 2.2 cm on the prior.  Small hiatal hernia.  Suggestion of distal esophageal wall thickening; image 45.  Resolved retrocrural adenopathy.  IMPRESSION:  1.  Marked response to therapy of thoracic and lower cervical adenopathy.  A borderline enlarged right hilar lymph node remains. 2.  Coronary artery atherosclerosis, including three-vessel disease and probable left main disease. 3.  A small hiatal hernia with suggestion of distal esophageal wall thickening/esophagitis.  CT ABDOMEN AND PELVIS  Findings:  Normal liver, spleen, stomach, pancreas, gallbladder, biliary tract, adrenal glands.  Left-sided renal sinus  cysts. Dominant right renal cyst of 4 cm.  Marked improvement in retroperitoneal adenopathy.  Small nodes remaining.  The largest measures 8 mm, not pathologic by size criteria.  Extensive colonic diverticulosis. Normal terminal ileum and appendix.  Normal small bowel without abdominal ascites.  Resolution of marked mesenteric adenopathy.  Marked improvement in pelvic adenopathy.  Left external iliac node measures 1.2 cm on image 112 versus 3.4 cm on the prior (when remeasured).  The bladder is mildly thick-walled.  Marked prostatomegaly. No significant free fluid.  Fat containing ventral abdominal wall hernia.  Bone island within the right ischium.  Minimal wedging of the T12 vertebral body is similar and without significant canal compromise.  IMPRESSION:  1.  Marked response to therapy of abdominal pelvic adenopathy. Mild residual left pelvic adenopathy remains. 2.  Marked prostatomegaly.  Bladder wall thickening which is likely secondary.  Original Report Authenticated By: Consuello Bossier, M.D.    ASSESSMENT: This is a very pleasant 71 years old [place] American male with bulky stage IV low-grade lymphoma. He is currently on treatment with [place] and Rituxan status post 3 cycles with significant improvement in his disease.   PLAN: I discussed the scan results with the patient and recommended for him to continue with the same treatment for 3 more cycles. He was started cycle #4 today. He would come back for followup visit in one month with the start of the next cycle of his chemotherapy. He was advised to call me immediately she has any concerning symptoms in the interval.  All questions were answered. The patient knows to call the clinic with any problems, questions or concerns. We can certainly see the patient much sooner if necessary.  I spent 15 minutes counseling the patient face to face. The total time spent in the appointment was 25 minutes.

## 2011-09-03 NOTE — Telephone Encounter (Signed)
appts made and pt aware that mw will add chemo and will print for pt aom

## 2011-09-03 NOTE — Telephone Encounter (Signed)
Per staff message I have scheduled appts. JMW  

## 2011-09-04 ENCOUNTER — Ambulatory Visit (HOSPITAL_BASED_OUTPATIENT_CLINIC_OR_DEPARTMENT_OTHER): Payer: Medicare Other

## 2011-09-04 VITALS — BP 130/77 | HR 70 | Temp 98.0°F

## 2011-09-04 DIAGNOSIS — C8590 Non-Hodgkin lymphoma, unspecified, unspecified site: Secondary | ICD-10-CM

## 2011-09-04 DIAGNOSIS — Z5111 Encounter for antineoplastic chemotherapy: Secondary | ICD-10-CM

## 2011-09-04 DIAGNOSIS — C8589 Other specified types of non-Hodgkin lymphoma, extranodal and solid organ sites: Secondary | ICD-10-CM

## 2011-09-04 MED ORDER — DEXAMETHASONE SODIUM PHOSPHATE 10 MG/ML IJ SOLN
10.0000 mg | Freq: Once | INTRAMUSCULAR | Status: AC
  Administered 2011-09-04: 10 mg via INTRAVENOUS

## 2011-09-04 MED ORDER — ONDANSETRON 8 MG/50ML IVPB (CHCC)
8.0000 mg | Freq: Once | INTRAVENOUS | Status: AC
  Administered 2011-09-04: 8 mg via INTRAVENOUS

## 2011-09-04 MED ORDER — SODIUM CHLORIDE 0.9 % IV SOLN
Freq: Once | INTRAVENOUS | Status: AC
  Administered 2011-09-04: 15:00:00 via INTRAVENOUS

## 2011-09-04 MED ORDER — SODIUM CHLORIDE 0.9 % IV SOLN
90.0000 mg/m2 | Freq: Once | INTRAVENOUS | Status: AC
  Administered 2011-09-04: 165 mg via INTRAVENOUS
  Filled 2011-09-04: qty 33

## 2011-09-04 NOTE — Patient Instructions (Signed)
[facility] Discharge Instructions for Patients Receiving Chemotherapy  Today you received the following chemotherapy agents Treanda.  To help prevent nausea and vomiting after your treatment, we encourage you to take your nausea medication.   If you develop nausea and vomiting that is not controlled by your nausea medication, call the clinic. If it is after clinic hours your family physician or the after hours number for the clinic or go to the Emergency Department.   BELOW ARE SYMPTOMS THAT SHOULD BE REPORTED IMMEDIATELY:  *FEVER GREATER THAN 100.5 F  *CHILLS WITH OR WITHOUT FEVER  NAUSEA AND VOMITING THAT IS NOT CONTROLLED WITH YOUR NAUSEA MEDICATION  *UNUSUAL SHORTNESS OF BREATH  *UNUSUAL BRUISING OR BLEEDING  TENDERNESS IN MOUTH AND THROAT WITH OR WITHOUT PRESENCE OF ULCERS  *URINARY PROBLEMS  *BOWEL PROBLEMS  UNUSUAL RASH Items with * indicate a potential emergency and should be followed up as soon as possible.  One of the nurses will contact you 24 hours after your treatment. Please let the nurse know about any problems that you may have experienced. Feel free to call the clinic you have any questions or concerns. The clinic phone number is [phone].   I have been informed and understand all the instructions given to me. I know to contact the clinic, my physician, or go to the Emergency Department if any problems should occur. I do not have any questions at this time, but understand that I may call the clinic during office hours   should I have any questions or need assistance in obtaining follow up care.    __________________________________________  _____________  __________ Signature of Patient or Authorized Representative            Date                   Time    __________________________________________ Nurse's Signature    

## 2011-09-10 ENCOUNTER — Other Ambulatory Visit (HOSPITAL_BASED_OUTPATIENT_CLINIC_OR_DEPARTMENT_OTHER): Payer: Medicare Other | Admitting: Lab

## 2011-09-10 DIAGNOSIS — C8590 Non-Hodgkin lymphoma, unspecified, unspecified site: Secondary | ICD-10-CM

## 2011-09-10 DIAGNOSIS — C8589 Other specified types of non-Hodgkin lymphoma, extranodal and solid organ sites: Secondary | ICD-10-CM

## 2011-09-10 LAB — CBC WITH DIFFERENTIAL/PLATELET
BASO%: 0.5 % (ref 0.0–2.0)
EOS%: 1.7 % (ref 0.0–7.0)
HCT: 37 % — ABNORMAL LOW (ref 38.4–49.9)
LYMPH%: 14.4 % (ref 14.0–49.0)
MCH: 30.5 pg (ref 27.2–33.4)
MCHC: 34.9 g/dL (ref 32.0–36.0)
MCV: 87.5 fL (ref 79.3–98.0)
NEUT%: 67.5 % (ref 39.0–75.0)
Platelets: 152 10*3/uL (ref 140–400)

## 2011-09-10 LAB — COMPREHENSIVE METABOLIC PANEL
ALT: 11 U/L (ref 0–53)
AST: 16 U/L (ref 0–37)
Albumin: 3.7 g/dL (ref 3.5–5.2)
Calcium: 8.7 mg/dL (ref 8.4–10.5)
Chloride: 106 mEq/L (ref 96–112)
Creatinine, Ser: 0.91 mg/dL (ref 0.50–1.35)
Potassium: 3.6 mEq/L (ref 3.5–5.3)
Sodium: 143 mEq/L (ref 135–145)
Total Protein: 5.5 g/dL — ABNORMAL LOW (ref 6.0–8.3)

## 2011-09-17 ENCOUNTER — Other Ambulatory Visit (HOSPITAL_BASED_OUTPATIENT_CLINIC_OR_DEPARTMENT_OTHER): Payer: Medicare Other

## 2011-09-17 DIAGNOSIS — C8590 Non-Hodgkin lymphoma, unspecified, unspecified site: Secondary | ICD-10-CM

## 2011-09-17 DIAGNOSIS — C8589 Other specified types of non-Hodgkin lymphoma, extranodal and solid organ sites: Secondary | ICD-10-CM

## 2011-09-17 LAB — CBC WITH DIFFERENTIAL/PLATELET
Basophils Absolute: 0 10*3/uL (ref 0.0–0.1)
EOS%: 1.3 % (ref 0.0–7.0)
HGB: 13.4 g/dL (ref 13.0–17.1)
MCH: 31 pg (ref 27.2–33.4)
MCV: 90.3 fL (ref 79.3–98.0)
MONO%: 21 % — ABNORMAL HIGH (ref 0.0–14.0)
RDW: 14.5 % (ref 11.0–14.6)

## 2011-09-21 LAB — COMPREHENSIVE METABOLIC PANEL
AST: 18 U/L (ref 0–37)
Albumin: 3.8 g/dL (ref 3.5–5.2)
Alkaline Phosphatase: 78 U/L (ref 39–117)
BUN: 12 mg/dL (ref 6–23)
Creatinine, Ser: 0.88 mg/dL (ref 0.50–1.35)

## 2011-09-24 ENCOUNTER — Other Ambulatory Visit (HOSPITAL_BASED_OUTPATIENT_CLINIC_OR_DEPARTMENT_OTHER): Payer: Medicare Other

## 2011-09-24 DIAGNOSIS — C8590 Non-Hodgkin lymphoma, unspecified, unspecified site: Secondary | ICD-10-CM

## 2011-09-24 DIAGNOSIS — C9190 Lymphoid leukemia, unspecified not having achieved remission: Secondary | ICD-10-CM

## 2011-09-24 LAB — COMPREHENSIVE METABOLIC PANEL
Albumin: 3.9 g/dL (ref 3.5–5.2)
CO2: 26 mEq/L (ref 19–32)
Calcium: 8.9 mg/dL (ref 8.4–10.5)
Chloride: 107 mEq/L (ref 96–112)
Glucose, Bld: 80 mg/dL (ref 70–99)
Potassium: 4 mEq/L (ref 3.5–5.3)
Sodium: 142 mEq/L (ref 135–145)
Total Protein: 5.8 g/dL — ABNORMAL LOW (ref 6.0–8.3)

## 2011-09-24 LAB — CBC WITH DIFFERENTIAL/PLATELET
BASO%: 0.7 % (ref 0.0–2.0)
Eosinophils Absolute: 0.1 10*3/uL (ref 0.0–0.5)
MONO#: 1 10*3/uL — ABNORMAL HIGH (ref 0.1–0.9)
MONO%: 21.2 % — ABNORMAL HIGH (ref 0.0–14.0)
NEUT#: 2 10*3/uL (ref 1.5–6.5)
RBC: 4.32 10*6/uL (ref 4.20–5.82)
RDW: 14.7 % — ABNORMAL HIGH (ref 11.0–14.6)
WBC: 4.6 10*3/uL (ref 4.0–10.3)

## 2011-10-01 ENCOUNTER — Telehealth: Payer: Self-pay | Admitting: Internal Medicine

## 2011-10-01 ENCOUNTER — Ambulatory Visit: Payer: Medicare Other | Admitting: Internal Medicine

## 2011-10-01 ENCOUNTER — Other Ambulatory Visit: Payer: Medicare Other | Admitting: Lab

## 2011-10-01 ENCOUNTER — Encounter: Payer: Self-pay | Admitting: Physician Assistant

## 2011-10-01 ENCOUNTER — Other Ambulatory Visit: Payer: Self-pay | Admitting: *Deleted

## 2011-10-01 ENCOUNTER — Ambulatory Visit (HOSPITAL_BASED_OUTPATIENT_CLINIC_OR_DEPARTMENT_OTHER): Payer: Medicare Other | Admitting: Physician Assistant

## 2011-10-01 ENCOUNTER — Ambulatory Visit (HOSPITAL_BASED_OUTPATIENT_CLINIC_OR_DEPARTMENT_OTHER): Payer: Medicare Other

## 2011-10-01 ENCOUNTER — Other Ambulatory Visit (HOSPITAL_BASED_OUTPATIENT_CLINIC_OR_DEPARTMENT_OTHER): Payer: Medicare Other | Admitting: Lab

## 2011-10-01 VITALS — BP 122/77 | HR 69 | Temp 97.2°F | Ht 66.0 in | Wt 149.7 lb

## 2011-10-01 VITALS — BP 114/66 | HR 61 | Temp 96.7°F

## 2011-10-01 DIAGNOSIS — G4700 Insomnia, unspecified: Secondary | ICD-10-CM

## 2011-10-01 DIAGNOSIS — C8590 Non-Hodgkin lymphoma, unspecified, unspecified site: Secondary | ICD-10-CM

## 2011-10-01 DIAGNOSIS — Z5111 Encounter for antineoplastic chemotherapy: Secondary | ICD-10-CM

## 2011-10-01 DIAGNOSIS — C8589 Other specified types of non-Hodgkin lymphoma, extranodal and solid organ sites: Secondary | ICD-10-CM

## 2011-10-01 DIAGNOSIS — Z5112 Encounter for antineoplastic immunotherapy: Secondary | ICD-10-CM

## 2011-10-01 LAB — COMPREHENSIVE METABOLIC PANEL
ALT: 14 U/L (ref 0–53)
AST: 22 U/L (ref 0–37)
Albumin: 4.1 g/dL (ref 3.5–5.2)
CO2: 25 mEq/L (ref 19–32)
Calcium: 9.5 mg/dL (ref 8.4–10.5)
Chloride: 105 mEq/L (ref 96–112)
Creatinine, Ser: 0.91 mg/dL (ref 0.50–1.35)
Potassium: 4.8 mEq/L (ref 3.5–5.3)
Total Protein: 6.2 g/dL (ref 6.0–8.3)

## 2011-10-01 LAB — CBC WITH DIFFERENTIAL/PLATELET
Eosinophils Absolute: 0.2 10*3/uL (ref 0.0–0.5)
HCT: 38.8 % (ref 38.4–49.9)
LYMPH%: 37.5 % (ref 14.0–49.0)
MONO#: 1.1 10*3/uL — ABNORMAL HIGH (ref 0.1–0.9)
NEUT#: 1.9 10*3/uL (ref 1.5–6.5)
Platelets: 181 10*3/uL (ref 140–400)
RBC: 4.46 10*6/uL (ref 4.20–5.82)
WBC: 5.2 10*3/uL (ref 4.0–10.3)
lymph#: 1.9 10*3/uL (ref 0.9–3.3)
nRBC: 0 % (ref 0–0)

## 2011-10-01 LAB — LACTATE DEHYDROGENASE: LDH: 205 U/L (ref 94–250)

## 2011-10-01 MED ORDER — ACETAMINOPHEN 325 MG PO TABS
650.0000 mg | ORAL_TABLET | Freq: Once | ORAL | Status: AC
  Administered 2011-10-01: 650 mg via ORAL

## 2011-10-01 MED ORDER — DIPHENHYDRAMINE HCL 25 MG PO CAPS
50.0000 mg | ORAL_CAPSULE | Freq: Once | ORAL | Status: AC
  Administered 2011-10-01: 50 mg via ORAL

## 2011-10-01 MED ORDER — ZOLPIDEM TARTRATE 10 MG PO TABS: ORAL_TABLET | ORAL | Status: DC

## 2011-10-01 MED ORDER — SODIUM CHLORIDE 0.9 % IV SOLN
375.0000 mg/m2 | Freq: Once | INTRAVENOUS | Status: AC
  Administered 2011-10-01: 700 mg via INTRAVENOUS
  Filled 2011-10-01: qty 70

## 2011-10-01 MED ORDER — ONDANSETRON 8 MG/50ML IVPB (CHCC)
8.0000 mg | Freq: Once | INTRAVENOUS | Status: AC
  Administered 2011-10-01: 8 mg via INTRAVENOUS

## 2011-10-01 MED ORDER — SODIUM CHLORIDE 0.9 % IV SOLN
90.0000 mg/m2 | Freq: Once | INTRAVENOUS | Status: AC
  Administered 2011-10-01: 165 mg via INTRAVENOUS
  Filled 2011-10-01: qty 33

## 2011-10-01 MED ORDER — SODIUM CHLORIDE 0.9 % IV SOLN
Freq: Once | INTRAVENOUS | Status: AC
  Administered 2011-10-01: 11:00:00 via INTRAVENOUS

## 2011-10-01 MED ORDER — DEXAMETHASONE SODIUM PHOSPHATE 10 MG/ML IJ SOLN
10.0000 mg | Freq: Once | INTRAMUSCULAR | Status: AC
  Administered 2011-10-01: 10 mg via INTRAVENOUS

## 2011-10-01 NOTE — Patient Instructions (Addendum)
[facility] Discharge Instructions for Patients Receiving Chemotherapy  Today you received the following chemotherapy agents Rituxan/Treanda  To help prevent nausea and vomiting after your treatment, we encourage you to take your nausea medication.   If you develop nausea and vomiting that is not controlled by your nausea medication, call the clinic. If it is after clinic hours your family physician or the after hours number for the clinic or go to the Emergency Department.   BELOW ARE SYMPTOMS THAT SHOULD BE REPORTED IMMEDIATELY:  *FEVER GREATER THAN 100.5 F  *CHILLS WITH OR WITHOUT FEVER  NAUSEA AND VOMITING THAT IS NOT CONTROLLED WITH YOUR NAUSEA MEDICATION  *UNUSUAL SHORTNESS OF BREATH  *UNUSUAL BRUISING OR BLEEDING  TENDERNESS IN MOUTH AND THROAT WITH OR WITHOUT PRESENCE OF ULCERS  *URINARY PROBLEMS  *BOWEL PROBLEMS  UNUSUAL RASH Items with * indicate a potential emergency and should be followed up as soon as possible.  One of the nurses will contact you 24 hours after your treatment. Please let the nurse know about any problems that you may have experienced. Feel free to call the clinic you have any questions or concerns. The clinic phone number is [phone].   I have been informed and understand all the instructions given to me. I know to contact the clinic, my physician, or go to the Emergency Department if any problems should occur. I do not have any questions at this time, but understand that I may call the clinic during office hours   should I have any questions or need assistance in obtaining follow up care.    __________________________________________  _____________  __________ Signature of Patient or Authorized Representative            Date                   Time    __________________________________________ Nurse's Signature

## 2011-10-01 NOTE — Addendum Note (Signed)
Addended by: Conni Slipper on: 10/01/2011 09:57 AM   Modules accepted: Orders

## 2011-10-01 NOTE — Telephone Encounter (Signed)
Gave pt appt for August 2013 , lab, chemo and ML

## 2011-10-01 NOTE — Progress Notes (Signed)
[facility] [phone]   Fax:([phone]  OFFICE PROGRESS NOTE  Tally Due, MD [address] [place] [address]  PRINCIPAL DIAGNOSIS: Bulky stage IV low grade lymphoma diagnosed in November 2009.   PRIOR THERAPY:  1. Status post 7 cycles of systemic chemotherapy with CHOP/Rituxan. Last dose was given May 2010. 2. Status post 6 cycles of maintenance Rituxan 375 mg/sq m given every 2 months. Last dose was given on May 29, 2010, and the patient was lost to followup at that time. He received another dose on 12/11/2010, then again missed 2 doses.  CURRENT THERAPY: systemic chemotherapy with Rituxan 375 mg/M2 on day 1 and that bendamustine 90 mg/M2 on days 1 and 2 every 4 weeks, status post 4 cycles.   INTERVAL HISTORY: Eric [name] 71 y.o. male returns to the clinic today for followup visit. The patient is tolerating his systemic chemotherapy with Glorianne [place] and Rituxan fairly well. He denied having any significant weight loss or night sweats. No nausea or vomiting. He has noticed significant improvement in the palpable lymphadenopathy. He presents to proceed with cycle #5 of his systemic chemotherapy with creatinine Rituxan. He requests a refill for his Ambien. He also would like a prescription for ibuprofen at 800 mg 4 intermittent aches and pains. He states is not taking this in several months but likes to have some on hand.   ALLERGIES:  is allergic to neosporin.  MEDICATIONS:  Current Outpatient Prescriptions  Medication Sig Dispense Refill  . cyanocobalamin 1000 MCG tablet Take 100 mcg by mouth daily.      . ferrous sulfate 325 (65 FE) MG tablet Take 325 mg by mouth daily with breakfast.      . omeprazole (PRILOSEC) 40 MG capsule Take 40 mg by mouth daily.      . ranitidine (ZANTAC) 150 MG tablet Take 150 mg by mouth 2 (two) times daily. Alternates with omeperazole      . zolpidem (AMBIEN) 10 MG tablet Take 1/2 to 1 tablet by mouth at  bedtime as needed for sleep  30 tablet  0    REVIEW OF SYSTEMS:  A comprehensive review of systems was negative.   PHYSICAL EXAMINATION: General appearance: alert, cooperative and no distress Head: Normocephalic, without obvious abnormality, atraumatic Neck: no adenopathy Lymph nodes: Cervical, supraclavicular, and axillary nodes normal. Resp: clear to auscultation bilaterally Cardio: regular rate and rhythm, S1, S2 normal, no murmur, click, rub or gallop GI: soft, non-tender; bowel sounds normal; no masses,  no organomegaly Extremities: extremities normal, atraumatic, no cyanosis or edema Neurologic: Alert and oriented X 3, normal strength and tone. Normal symmetric reflexes. Normal coordination and gait  ECOG PERFORMANCE STATUS: 1 - Symptomatic but completely ambulatory  Blood pressure 122/77, pulse 69, temperature 97.2 F (36.2 C), temperature source Oral, height 5\' 6"  (1.676 m), weight 149 lb 11.2 oz (67.903 kg).  LABORATORY DATA: Lab Results  Component Value Date   WBC 5.2 10/01/2011   HGB 13.5 10/01/2011   HCT 38.8 10/01/2011   MCV 87.0 10/01/2011   PLT 181 10/01/2011      Chemistry      Component Value Date/Time   NA 142 09/24/2011 1349   NA 141 03/23/2010 1301   K 4.0 09/24/2011 1349   K 4.7 03/23/2010 1301   CL 107 09/24/2011 1349   CL 99 03/23/2010 1301   CO2 26 09/24/2011 1349   CO2 26 03/23/2010 1301   BUN 12 09/24/2011 1349  BUN 12 03/23/2010 1301   CREATININE 0.94 09/24/2011 1349   CREATININE 1.0 03/23/2010 1301      Component Value Date/Time   CALCIUM 8.9 09/24/2011 1349   CALCIUM 9.1 03/23/2010 1301   ALKPHOS 75 09/24/2011 1349   ALKPHOS 78 03/23/2010 1301   AST 19 09/24/2011 1349   AST 25 03/23/2010 1301   ALT 13 09/24/2011 1349   BILITOT 0.5 09/24/2011 1349   BILITOT 0.60 03/23/2010 1301       RADIOGRAPHIC STUDIES: Ct Chest W Contrast  08/30/2011  *RADIOLOGY REPORT*  Clinical Data:  Follow up of non-Hodgkins lymphoma.  Originally diagnosed in right axilla and  neck.  Ongoing chemotherapy.  CT CHEST, ABDOMEN AND PELVIS WITH CONTRAST  Technique: Contiguous axial images of the chest abdomen and pelvis were obtained after IV contrast administration.  Contrast: 100  ml Omnipaque-300  Comparison: 06/04/2011  CT CHEST  Findings: Lung windows demonstrate no nodules or airspace opacities.  Soft tissue windows demonstrate resolved supraclavicular/low cervical adenopathy.  Prior axillary nodal dissection.  Resolved left axillary adenopathy.  Normal heart size without pericardial or pleural effusion. Multivessel coronary artery atherosclerosis.  No central pulmonary embolism, on this non-dedicated study.  Resolved mediastinal adenopathy.  Right hilar nodal tissue is decreased in size.  Now upper limits of normal at 1.2 cm; 2.2 cm on the prior.  Small hiatal hernia.  Suggestion of distal esophageal wall thickening; image 45.  Resolved retrocrural adenopathy.  IMPRESSION:  1.  Marked response to therapy of thoracic and lower cervical adenopathy.  A borderline enlarged right hilar lymph node remains. 2.  Coronary artery atherosclerosis, including three-vessel disease and probable left main disease. 3.  A small hiatal hernia with suggestion of distal esophageal wall thickening/esophagitis.  CT ABDOMEN AND PELVIS  Findings:  Normal liver, spleen, stomach, pancreas, gallbladder, biliary tract, adrenal glands.  Left-sided renal sinus cysts. Dominant right renal cyst of 4 cm.  Marked improvement in retroperitoneal adenopathy.  Small nodes remaining.  The largest measures 8 mm, not pathologic by size criteria.  Extensive colonic diverticulosis. Normal terminal ileum and appendix.  Normal small bowel without abdominal ascites.  Resolution of marked mesenteric adenopathy.  Marked improvement in pelvic adenopathy.  Left external iliac node measures 1.2 cm on image 112 versus 3.4 cm on the prior (when remeasured).  The bladder is mildly thick-walled.  Marked prostatomegaly. No significant free  fluid.  Fat containing ventral abdominal wall hernia.  Bone island within the right ischium.  Minimal wedging of the T12 vertebral body is similar and without significant canal compromise.  IMPRESSION:  1.  Marked response to therapy of abdominal pelvic adenopathy. Mild residual left pelvic adenopathy remains. 2.  Marked prostatomegaly.  Bladder wall thickening which is likely secondary.  Original Report Authenticated By: Consuello Bossier, M.D.   Ct Abdomen Pelvis W Contrast  08/30/2011  *RADIOLOGY REPORT*  Clinical Data:  Follow up of non-Hodgkins lymphoma.  Originally diagnosed in right axilla and neck.  Ongoing chemotherapy.  CT CHEST, ABDOMEN AND PELVIS WITH CONTRAST  Technique: Contiguous axial images of the chest abdomen and pelvis were obtained after IV contrast administration.  Contrast: 100  ml Omnipaque-300  Comparison: 06/04/2011  CT CHEST  Findings: Lung windows demonstrate no nodules or airspace opacities.  Soft tissue windows demonstrate resolved supraclavicular/low cervical adenopathy.  Prior axillary nodal dissection.  Resolved left axillary adenopathy.  Normal heart size without pericardial or pleural effusion. Multivessel coronary artery atherosclerosis.  No central pulmonary embolism, on this non-dedicated study.  Resolved mediastinal adenopathy.  Right hilar nodal tissue is decreased in size.  Now upper limits of normal at 1.2 cm; 2.2 cm on the prior.  Small hiatal hernia.  Suggestion of distal esophageal wall thickening; image 45.  Resolved retrocrural adenopathy.  IMPRESSION:  1.  Marked response to therapy of thoracic and lower cervical adenopathy.  A borderline enlarged right hilar lymph node remains. 2.  Coronary artery atherosclerosis, including three-vessel disease and probable left main disease. 3.  A small hiatal hernia with suggestion of distal esophageal wall thickening/esophagitis.  CT ABDOMEN AND PELVIS  Findings:  Normal liver, spleen, stomach, pancreas, gallbladder, biliary tract,  adrenal glands.  Left-sided renal sinus cysts. Dominant right renal cyst of 4 cm.  Marked improvement in retroperitoneal adenopathy.  Small nodes remaining.  The largest measures 8 mm, not pathologic by size criteria.  Extensive colonic diverticulosis. Normal terminal ileum and appendix.  Normal small bowel without abdominal ascites.  Resolution of marked mesenteric adenopathy.  Marked improvement in pelvic adenopathy.  Left external iliac node measures 1.2 cm on image 112 versus 3.4 cm on the prior (when remeasured).  The bladder is mildly thick-walled.  Marked prostatomegaly. No significant free fluid.  Fat containing ventral abdominal wall hernia.  Bone island within the right ischium.  Minimal wedging of the T12 vertebral body is similar and without significant canal compromise.  IMPRESSION:  1.  Marked response to therapy of abdominal pelvic adenopathy. Mild residual left pelvic adenopathy remains. 2.  Marked prostatomegaly.  Bladder wall thickening which is likely secondary.  Original Report Authenticated By: Consuello Bossier, M.D.    ASSESSMENT/PLAN: This is a very pleasant 71 years old [place] American male with bulky stage IV low-grade lymphoma. He is currently on treatment with [place] and Rituxan status post 4 cycles with significant improvement in his disease. The patient was discussed with Dr. Arbutus Ped. Patient was advised to avoid ibuprofen as this would not be good for his bone marrow in the long run. The patient was in agreement with this decision. He was given a prescription for Ambien 10 mg one tablet by mouth at bedtime as needed for insomnia total of 30 tablets with no refill. He'll proceed today with cycle #5 of his systemic chemotherapy with bendamustine and Rituxan. He'll return in one month with repeat CBC differential, C. met, and LDH prior to cycle #6 of his systemic chemotherapy with bendamustine  and Rituxan.  Laural Benes, Taiesha Bovard E, PA-C   All questions were answered. The patient knows to  call the clinic with any problems, questions or concerns. We can certainly see the patient much sooner if necessary.  I spent 20  minutes counseling the patient face to face. The total time spent in the appointment was 30 minutes.

## 2011-10-02 ENCOUNTER — Other Ambulatory Visit: Payer: Medicare Other | Admitting: Lab

## 2011-10-02 ENCOUNTER — Ambulatory Visit: Payer: Medicare Other | Admitting: Internal Medicine

## 2011-10-02 ENCOUNTER — Ambulatory Visit (HOSPITAL_BASED_OUTPATIENT_CLINIC_OR_DEPARTMENT_OTHER): Payer: Medicare Other

## 2011-10-02 VITALS — BP 126/82 | HR 62 | Temp 97.5°F

## 2011-10-02 DIAGNOSIS — C8589 Other specified types of non-Hodgkin lymphoma, extranodal and solid organ sites: Secondary | ICD-10-CM

## 2011-10-02 DIAGNOSIS — C8590 Non-Hodgkin lymphoma, unspecified, unspecified site: Secondary | ICD-10-CM

## 2011-10-02 DIAGNOSIS — Z5111 Encounter for antineoplastic chemotherapy: Secondary | ICD-10-CM

## 2011-10-02 MED ORDER — SODIUM CHLORIDE 0.9 % IV SOLN
Freq: Once | INTRAVENOUS | Status: AC
  Administered 2011-10-02: 16:00:00 via INTRAVENOUS

## 2011-10-02 MED ORDER — DEXAMETHASONE SODIUM PHOSPHATE 10 MG/ML IJ SOLN
10.0000 mg | Freq: Once | INTRAMUSCULAR | Status: AC
  Administered 2011-10-02: 10 mg via INTRAVENOUS

## 2011-10-02 MED ORDER — ONDANSETRON 8 MG/50ML IVPB (CHCC)
8.0000 mg | Freq: Once | INTRAVENOUS | Status: AC
  Administered 2011-10-02: 8 mg via INTRAVENOUS

## 2011-10-02 MED ORDER — SODIUM CHLORIDE 0.9 % IV SOLN
90.0000 mg/m2 | Freq: Once | INTRAVENOUS | Status: AC
  Administered 2011-10-02: 165 mg via INTRAVENOUS
  Filled 2011-10-02: qty 33

## 2011-10-02 NOTE — Patient Instructions (Addendum)
[facility] Discharge Instructions for Patients Receiving Chemotherapy  Today you received the following chemotherapy agents :  Treanda  To help prevent nausea and vomiting after your treatment, we encourage you to take your nausea medication as instructed by your physician, and as needed for nausea.    If you develop nausea and vomiting that is not controlled by your nausea medication, call the clinic. If it is after clinic hours your family physician or the after hours number for the clinic or go to the Emergency Department.   BELOW ARE SYMPTOMS THAT SHOULD BE REPORTED IMMEDIATELY:  *FEVER GREATER THAN 100.5 F  *CHILLS WITH OR WITHOUT FEVER  NAUSEA AND VOMITING THAT IS NOT CONTROLLED WITH YOUR NAUSEA MEDICATION  *UNUSUAL SHORTNESS OF BREATH  *UNUSUAL BRUISING OR BLEEDING  TENDERNESS IN MOUTH AND THROAT WITH OR WITHOUT PRESENCE OF ULCERS  *URINARY PROBLEMS  *BOWEL PROBLEMS  UNUSUAL RASH Items with * indicate a potential emergency and should be followed up as soon as possible.  One of the nurses will contact you 24 hours after your treatment. Please let the nurse know about any problems that you may have experienced. Feel free to call the clinic you have any questions or concerns. The clinic phone number is [phone].   I have been informed and understand all the instructions given to me. I know to contact the clinic, my physician, or go to the Emergency Department if any problems should occur. I do not have any questions at this time, but understand that I may call the clinic during office hours   should I have any questions or need assistance in obtaining follow up care.    __________________________________________  _____________  __________ Signature of Patient or Authorized Representative            Date                   Time    __________________________________________ Nurse's Signature

## 2011-10-08 ENCOUNTER — Other Ambulatory Visit: Payer: Medicare Other | Admitting: Lab

## 2011-10-08 DIAGNOSIS — C8590 Non-Hodgkin lymphoma, unspecified, unspecified site: Secondary | ICD-10-CM

## 2011-10-08 DIAGNOSIS — C8589 Other specified types of non-Hodgkin lymphoma, extranodal and solid organ sites: Secondary | ICD-10-CM

## 2011-10-08 LAB — CBC WITH DIFFERENTIAL/PLATELET
Basophils Absolute: 0 10*3/uL (ref 0.0–0.1)
Eosinophils Absolute: 0.3 10*3/uL (ref 0.0–0.5)
HCT: 37.7 % — ABNORMAL LOW (ref 38.4–49.9)
HGB: 13.1 g/dL (ref 13.0–17.1)
NEUT#: 2.4 10*3/uL (ref 1.5–6.5)
RDW: 14.1 % (ref 11.0–14.6)
lymph#: 0.3 10*3/uL — ABNORMAL LOW (ref 0.9–3.3)

## 2011-10-08 LAB — COMPREHENSIVE METABOLIC PANEL
Albumin: 3.5 g/dL (ref 3.5–5.2)
Alkaline Phosphatase: 74 U/L (ref 39–117)
BUN: 11 mg/dL (ref 6–23)
Calcium: 8.8 mg/dL (ref 8.4–10.5)
Chloride: 106 mEq/L (ref 96–112)
Creatinine, Ser: 0.86 mg/dL (ref 0.50–1.35)
Glucose, Bld: 87 mg/dL (ref 70–99)
Potassium: 3.5 mEq/L (ref 3.5–5.3)

## 2011-10-15 ENCOUNTER — Other Ambulatory Visit (HOSPITAL_BASED_OUTPATIENT_CLINIC_OR_DEPARTMENT_OTHER): Payer: Medicare Other | Admitting: Lab

## 2011-10-15 DIAGNOSIS — C8590 Non-Hodgkin lymphoma, unspecified, unspecified site: Secondary | ICD-10-CM

## 2011-10-15 DIAGNOSIS — C8589 Other specified types of non-Hodgkin lymphoma, extranodal and solid organ sites: Secondary | ICD-10-CM

## 2011-10-15 LAB — CBC WITH DIFFERENTIAL/PLATELET
Basophils Absolute: 0 10*3/uL (ref 0.0–0.1)
Eosinophils Absolute: 0.2 10*3/uL (ref 0.0–0.5)
HCT: 37.1 % — ABNORMAL LOW (ref 38.4–49.9)
HGB: 12.7 g/dL — ABNORMAL LOW (ref 13.0–17.1)
LYMPH%: 15 % (ref 14.0–49.0)
MCV: 91.1 fL (ref 79.3–98.0)
MONO%: 19.9 % — ABNORMAL HIGH (ref 0.0–14.0)
NEUT#: 2.7 10*3/uL (ref 1.5–6.5)
NEUT%: 59.7 % (ref 39.0–75.0)
Platelets: 208 10*3/uL (ref 140–400)

## 2011-10-15 LAB — COMPREHENSIVE METABOLIC PANEL
Albumin: 3.6 g/dL (ref 3.5–5.2)
Alkaline Phosphatase: 84 U/L (ref 39–117)
BUN: 8 mg/dL (ref 6–23)
Creatinine, Ser: 0.89 mg/dL (ref 0.50–1.35)
Glucose, Bld: 91 mg/dL (ref 70–99)
Potassium: 3.7 mEq/L (ref 3.5–5.3)
Total Bilirubin: 0.4 mg/dL (ref 0.3–1.2)

## 2011-10-22 ENCOUNTER — Other Ambulatory Visit (HOSPITAL_BASED_OUTPATIENT_CLINIC_OR_DEPARTMENT_OTHER): Payer: Medicare Other | Admitting: Lab

## 2011-10-22 DIAGNOSIS — C8589 Other specified types of non-Hodgkin lymphoma, extranodal and solid organ sites: Secondary | ICD-10-CM

## 2011-10-22 DIAGNOSIS — C8590 Non-Hodgkin lymphoma, unspecified, unspecified site: Secondary | ICD-10-CM

## 2011-10-22 LAB — COMPREHENSIVE METABOLIC PANEL
Albumin: 3.8 g/dL (ref 3.5–5.2)
CO2: 27 mEq/L (ref 19–32)
Calcium: 8.8 mg/dL (ref 8.4–10.5)
Glucose, Bld: 80 mg/dL (ref 70–99)
Potassium: 4.2 mEq/L (ref 3.5–5.3)
Sodium: 143 mEq/L (ref 135–145)
Total Protein: 6 g/dL (ref 6.0–8.3)

## 2011-10-22 LAB — CBC WITH DIFFERENTIAL/PLATELET
Eosinophils Absolute: 0.4 10*3/uL (ref 0.0–0.5)
LYMPH%: 22.2 % (ref 14.0–49.0)
MONO#: 1 10*3/uL — ABNORMAL HIGH (ref 0.1–0.9)
NEUT#: 1.8 10*3/uL (ref 1.5–6.5)
Platelets: 186 10*3/uL (ref 140–400)
RBC: 4.34 10*6/uL (ref 4.20–5.82)
RDW: 14.2 % (ref 11.0–14.6)
WBC: 4 10*3/uL (ref 4.0–10.3)

## 2011-10-22 LAB — LACTATE DEHYDROGENASE: LDH: 187 U/L (ref 94–250)

## 2011-10-29 ENCOUNTER — Other Ambulatory Visit (HOSPITAL_BASED_OUTPATIENT_CLINIC_OR_DEPARTMENT_OTHER): Payer: Medicare Other | Admitting: Lab

## 2011-10-29 ENCOUNTER — Ambulatory Visit (HOSPITAL_BASED_OUTPATIENT_CLINIC_OR_DEPARTMENT_OTHER): Payer: Medicare Other | Admitting: Physician Assistant

## 2011-10-29 ENCOUNTER — Encounter: Payer: Self-pay | Admitting: Physician Assistant

## 2011-10-29 ENCOUNTER — Ambulatory Visit: Payer: Medicare Other | Admitting: Lab

## 2011-10-29 ENCOUNTER — Ambulatory Visit (HOSPITAL_BASED_OUTPATIENT_CLINIC_OR_DEPARTMENT_OTHER): Payer: Medicare Other

## 2011-10-29 VITALS — BP 123/76 | HR 64 | Temp 97.9°F | Resp 20

## 2011-10-29 VITALS — BP 120/80 | HR 77 | Temp 97.3°F | Resp 18 | Ht 66.0 in | Wt 149.1 lb

## 2011-10-29 DIAGNOSIS — C8590 Non-Hodgkin lymphoma, unspecified, unspecified site: Secondary | ICD-10-CM

## 2011-10-29 DIAGNOSIS — Z5112 Encounter for antineoplastic immunotherapy: Secondary | ICD-10-CM

## 2011-10-29 DIAGNOSIS — C8588 Other specified types of non-Hodgkin lymphoma, lymph nodes of multiple sites: Secondary | ICD-10-CM

## 2011-10-29 DIAGNOSIS — Z5111 Encounter for antineoplastic chemotherapy: Secondary | ICD-10-CM

## 2011-10-29 LAB — CBC WITH DIFFERENTIAL/PLATELET
BASO%: 0.7 % (ref 0.0–2.0)
Basophils Absolute: 0 10*3/uL (ref 0.0–0.1)
HCT: 38.5 % (ref 38.4–49.9)
HGB: 13.6 g/dL (ref 13.0–17.1)
MONO#: 1.1 10*3/uL — ABNORMAL HIGH (ref 0.1–0.9)
NEUT#: 2 10*3/uL (ref 1.5–6.5)
NEUT%: 37.3 % — ABNORMAL LOW (ref 39.0–75.0)
WBC: 5.4 10*3/uL (ref 4.0–10.3)
lymph#: 1.8 10*3/uL (ref 0.9–3.3)

## 2011-10-29 LAB — COMPREHENSIVE METABOLIC PANEL (CC13)
ALT: 23 U/L (ref 0–55)
AST: 24 U/L (ref 5–34)
Albumin: 3.6 g/dL (ref 3.5–5.0)
BUN: 12 mg/dL (ref 7.0–26.0)
Calcium: 9 mg/dL (ref 8.4–10.4)
Chloride: 108 mEq/L — ABNORMAL HIGH (ref 98–107)
Glucose: 94 mg/dl (ref 70–99)
Potassium: 5.1 mEq/L (ref 3.5–5.1)

## 2011-10-29 MED ORDER — DEXAMETHASONE SODIUM PHOSPHATE 10 MG/ML IJ SOLN
10.0000 mg | Freq: Once | INTRAMUSCULAR | Status: AC
  Administered 2011-10-29: 10 mg via INTRAVENOUS

## 2011-10-29 MED ORDER — ONDANSETRON 8 MG/50ML IVPB (CHCC)
8.0000 mg | Freq: Once | INTRAVENOUS | Status: AC
  Administered 2011-10-29: 8 mg via INTRAVENOUS

## 2011-10-29 MED ORDER — SODIUM CHLORIDE 0.9 % IV SOLN
Freq: Once | INTRAVENOUS | Status: AC
  Administered 2011-10-29: 11:00:00 via INTRAVENOUS

## 2011-10-29 MED ORDER — ACETAMINOPHEN 325 MG PO TABS
650.0000 mg | ORAL_TABLET | Freq: Once | ORAL | Status: AC
  Administered 2011-10-29: 650 mg via ORAL

## 2011-10-29 MED ORDER — SODIUM CHLORIDE 0.9 % IV SOLN
375.0000 mg/m2 | Freq: Once | INTRAVENOUS | Status: AC
  Administered 2011-10-29: 700 mg via INTRAVENOUS
  Filled 2011-10-29: qty 70

## 2011-10-29 MED ORDER — DIPHENHYDRAMINE HCL 25 MG PO CAPS
50.0000 mg | ORAL_CAPSULE | Freq: Once | ORAL | Status: AC
  Administered 2011-10-29: 50 mg via ORAL

## 2011-10-29 MED ORDER — SODIUM CHLORIDE 0.9 % IV SOLN
90.0000 mg/m2 | Freq: Once | INTRAVENOUS | Status: AC
  Administered 2011-10-29: 165 mg via INTRAVENOUS
  Filled 2011-10-29: qty 33

## 2011-10-29 NOTE — Progress Notes (Signed)
[facility] Telephone:(336) [phone]   Fax:([phone]  OFFICE PROGRESS NOTE  Eric Due, MD [name] [place] [address]  PRINCIPAL DIAGNOSIS: Bulky stage IV low grade lymphoma diagnosed in November 2009.   PRIOR THERAPY:  1. Status post 7 cycles of systemic chemotherapy with CHOP/Rituxan. Last dose was given May 2010. 2. Status post 6 cycles of maintenance Rituxan 375 mg/sq m given every 2 months. Last dose was given on May 29, 2010, and the patient was lost to followup at that time. He received another dose on 12/11/2010, then again missed 2 doses.  CURRENT THERAPY: systemic chemotherapy with Rituxan 375 mg/M2 on day 1 and that bendamustine 90 mg/M2 on days 1 and 2 every 4 weeks, status post 5 cycles.   INTERVAL HISTORY: Eric [name] 71 y.o. male returns to the clinic today for followup visit. The patient is tolerating his systemic chemotherapy with Treanda and Rituxan fairly well. He complains of some fatigue. He denied having any significant weight loss.he feels he may have developed some mild night sweats over the last week or so but states that it also may be [name] to the recent weather.  He denied nausea or vomiting. He has noticed some tenderness and questionable nodularity to his veins after treatment. It is bothersome but not problematic  He presents to proceed with cycle #6 of his systemic chemotherapy with Treanda and Rituxan.   ALLERGIES:  is allergic to neosporin.  MEDICATIONS:  Current Outpatient Prescriptions  Medication Sig Dispense Refill  . cyanocobalamin 1000 MCG tablet Take 100 mcg by mouth daily.      . ferrous sulfate 325 (65 FE) MG tablet Take 325 mg by mouth daily with breakfast.      . omeprazole (PRILOSEC) 40 MG capsule Take 40 mg by mouth daily.      . ranitidine (ZANTAC) 150 MG tablet Take 150 mg by mouth 2 (two) times daily. Alternates with omeperazole      . zolpidem (AMBIEN) 10 MG tablet Take 1/2 to 1 tablet by  mouth at bedtime as needed for sleep  30 tablet  0   No current facility-administered medications for this visit.   Facility-Administered Medications Ordered in Other Visits  Medication Dose Route Frequency Provider Last Rate Last Dose  . 0.9 %  sodium chloride infusion   Intravenous Once Conni Slipper, PA 20 mL/hr at 10/29/11 1047    . acetaminophen (TYLENOL) tablet 650 mg  650 mg Oral Once [employer], PA   650 mg at 10/29/11 1049  . bendamustine (TREANDA) 165 mg in sodium chloride 0.9 % 500 mL chemo infusion  90 mg/m2 (Treatment Plan Actual) Intravenous Once Conni Slipper, PA 533 mL/hr at 10/29/11 1338 165 mg at 10/29/11 1338  . dexamethasone (DECADRON) injection 10 mg  10 mg Intravenous Once Conni Slipper, PA   10 mg at 10/29/11 1048  . diphenhydrAMINE (BENADRYL) capsule 50 mg  50 mg Oral Once Conni Slipper, PA   50 mg at 10/29/11 1049  . ondansetron (ZOFRAN) IVPB 8 mg  8 mg Intravenous Once Conni Slipper, PA   8 mg at 10/29/11 1048  . riTUXimab (RITUXAN) 700 mg in sodium chloride 0.9 % 180 mL chemo infusion  375 mg/m2 (Treatment Plan Actual) Intravenous Once Conni Slipper, PA 100 mL/hr at 10/29/11 1149 700 mg at 10/29/11 1149    REVIEW OF SYSTEMS:  A comprehensive review of systems was negative.  PHYSICAL EXAMINATION: General appearance: alert, cooperative and no distress Head: Normocephalic, without obvious abnormality, atraumatic Neck: no adenopathy Lymph nodes: Cervical, supraclavicular, and axillary nodes normal. Resp: clear to auscultation bilaterally Cardio: regular rate and rhythm, S1, S2 normal, no murmur, click, rub or gallop GI: soft, non-tender; bowel sounds normal; no masses,  no organomegaly Extremities: extremities normal, atraumatic, no cyanosis or edema Neurologic: Alert and oriented X 3, normal strength and tone. Normal symmetric reflexes. Normal coordination and gait Examination the patient peripheral veins on the forearms reveals some firmness  and questionable on nodularity however there is no warmth or erythema and no tenderness at this time.  ECOG PERFORMANCE STATUS: 1 - Symptomatic but completely ambulatory  Blood pressure 120/80, pulse 77, temperature 97.3 F (36.3 C), temperature source Oral, resp. rate 18, height 5\' 6"  (1.676 m), weight 149 lb 1.6 oz (67.631 kg).  LABORATORY DATA: Lab Results  Component Value Date   WBC 5.4 10/29/2011   HGB 13.6 10/29/2011   HCT 38.5 10/29/2011   MCV 87.1 10/29/2011   PLT 182 10/29/2011      Chemistry      Component Value Date/Time   NA 143 10/22/2011 0834   NA 141 03/23/2010 1301   K 4.2 10/22/2011 0834   K 4.7 03/23/2010 1301   CL 105 10/22/2011 0834   CL 99 03/23/2010 1301   CO2 27 10/22/2011 0834   CO2 26 03/23/2010 1301   BUN 13 10/22/2011 0834   BUN 12 03/23/2010 1301   CREATININE 0.92 10/22/2011 0834   CREATININE 1.0 03/23/2010 1301      Component Value Date/Time   CALCIUM 8.8 10/22/2011 0834   CALCIUM 9.1 03/23/2010 1301   ALKPHOS 101 10/22/2011 0834   ALKPHOS 78 03/23/2010 1301   AST 22 10/22/2011 0834   AST 25 03/23/2010 1301   ALT 19 10/22/2011 0834   BILITOT 0.4 10/22/2011 0834   BILITOT 0.60 03/23/2010 1301       RADIOGRAPHIC STUDIES: Ct Chest W Contrast  08/30/2011  *RADIOLOGY REPORT*  Clinical Data:  Follow up of non-Hodgkins lymphoma.  Originally diagnosed in right axilla and neck.  Ongoing chemotherapy.  CT CHEST, ABDOMEN AND PELVIS WITH CONTRAST  Technique: Contiguous axial images of the chest abdomen and pelvis were obtained after IV contrast administration.  Contrast: 100  ml Omnipaque-300  Comparison: 06/04/2011  CT CHEST  Findings: Lung windows demonstrate no nodules or airspace opacities.  Soft tissue windows demonstrate resolved supraclavicular/low cervical adenopathy.  Prior axillary nodal dissection.  Resolved left axillary adenopathy.  Normal heart size without pericardial or pleural effusion. Multivessel coronary artery atherosclerosis.  No central pulmonary  embolism, on this non-dedicated study.  Resolved mediastinal adenopathy.  Right hilar nodal tissue is decreased in size.  Now upper limits of normal at 1.2 cm; 2.2 cm on the prior.  Small hiatal hernia.  Suggestion of distal esophageal wall thickening; image 45.  Resolved retrocrural adenopathy.  IMPRESSION:  1.  Marked response to therapy of thoracic and lower cervical adenopathy.  A borderline enlarged right hilar lymph node remains. 2.  Coronary artery atherosclerosis, including three-vessel disease and probable left main disease. 3.  A small hiatal hernia with suggestion of distal esophageal wall thickening/esophagitis.  CT ABDOMEN AND PELVIS  Findings:  Normal liver, spleen, stomach, pancreas, gallbladder, biliary tract, adrenal glands.  Left-sided renal sinus cysts. Dominant right renal cyst of 4 cm.  Marked improvement in retroperitoneal adenopathy.  Small nodes remaining.  The largest measures 8 mm, not pathologic by  size criteria.  Extensive colonic diverticulosis. Normal terminal ileum and appendix.  Normal small bowel without abdominal ascites.  Resolution of marked mesenteric adenopathy.  Marked improvement in pelvic adenopathy.  Left external iliac node measures 1.2 cm on image 112 versus 3.4 cm on the prior (when remeasured).  The bladder is mildly thick-walled.  Marked prostatomegaly. No significant free fluid.  Fat containing ventral abdominal wall hernia.  Bone island within the right ischium.  Minimal wedging of the T12 vertebral body is similar and without significant canal compromise.  IMPRESSION:  1.  Marked response to therapy of abdominal pelvic adenopathy. Mild residual left pelvic adenopathy remains. 2.  Marked prostatomegaly.  Bladder wall thickening which is likely secondary.  Original Report Authenticated By: Consuello Bossier, M.D.   Ct Abdomen Pelvis W Contrast  08/30/2011  *RADIOLOGY REPORT*  Clinical Data:  Follow up of non-Hodgkins lymphoma.  Originally diagnosed in right axilla and  neck.  Ongoing chemotherapy.  CT CHEST, ABDOMEN AND PELVIS WITH CONTRAST  Technique: Contiguous axial images of the chest abdomen and pelvis were obtained after IV contrast administration.  Contrast: 100  ml Omnipaque-300  Comparison: 06/04/2011  CT CHEST  Findings: Lung windows demonstrate no nodules or airspace opacities.  Soft tissue windows demonstrate resolved supraclavicular/low cervical adenopathy.  Prior axillary nodal dissection.  Resolved left axillary adenopathy.  Normal heart size without pericardial or pleural effusion. Multivessel coronary artery atherosclerosis.  No central pulmonary embolism, on this non-dedicated study.  Resolved mediastinal adenopathy.  Right hilar nodal tissue is decreased in size.  Now upper limits of normal at 1.2 cm; 2.2 cm on the prior.  Small hiatal hernia.  Suggestion of distal esophageal wall thickening; image 45.  Resolved retrocrural adenopathy.  IMPRESSION:  1.  Marked response to therapy of thoracic and lower cervical adenopathy.  A borderline enlarged right hilar lymph node remains. 2.  Coronary artery atherosclerosis, including three-vessel disease and probable left main disease. 3.  A small hiatal hernia with suggestion of distal esophageal wall thickening/esophagitis.  CT ABDOMEN AND PELVIS  Findings:  Normal liver, spleen, stomach, pancreas, gallbladder, biliary tract, adrenal glands.  Left-sided renal sinus cysts. Dominant right renal cyst of 4 cm.  Marked improvement in retroperitoneal adenopathy.  Small nodes remaining.  The largest measures 8 mm, not pathologic by size criteria.  Extensive colonic diverticulosis. Normal terminal ileum and appendix.  Normal small bowel without abdominal ascites.  Resolution of marked mesenteric adenopathy.  Marked improvement in pelvic adenopathy.  Left external iliac node measures 1.2 cm on image 112 versus 3.4 cm on the prior (when remeasured).  The bladder is mildly thick-walled.  Marked prostatomegaly. No significant free  fluid.  Fat containing ventral abdominal wall hernia.  Bone island within the right ischium.  Minimal wedging of the T12 vertebral body is similar and without significant canal compromise.  IMPRESSION:  1.  Marked response to therapy of abdominal pelvic adenopathy. Mild residual left pelvic adenopathy remains. 2.  Marked prostatomegaly.  Bladder wall thickening which is likely secondary.  Original Report Authenticated By: Consuello Bossier, M.D.    ASSESSMENT/PLAN: This is a very pleasant 71 years old [place] American male with bulky stage IV low-grade lymphoma. He is currently on treatment with [place] and Rituxan status post 5 cycles with significant improvement in his disease. The patient was discussed with Dr. Arbutus Ped. He'll proceed today with cycle #6 of his systemic chemotherapy with bendamustine and Rituxan. He'll return in one month with repeat CBC differential, C. met, and  LDH AS WELL as a restaging PET CT to reevaluate his disease. He will be seen by Dr. Arbutus Ped when he returns in one month we will discuss the results of the restaging PET/CT scan with the patient. In discussing the patient's peripheral veins with our cancer Center pharmacy this it is felt that the changes in his veins are likely [name] to the [place]. He may use ice after treatment today and tomorrow and continue symptomatic treatment as needed.  Eric [name], Eric Koller E, PA-C   All questions were answered. The patient knows to call the clinic with any problems, questions or concerns. We can certainly see the patient much sooner if necessary.  I spent 20  minutes counseling the patient face to face. The total time spent in the appointment was 30 minutes.

## 2011-10-30 ENCOUNTER — Encounter: Payer: Self-pay | Admitting: Physician Assistant

## 2011-10-30 ENCOUNTER — Other Ambulatory Visit: Payer: Self-pay | Admitting: Physician Assistant

## 2011-10-30 ENCOUNTER — Telehealth: Payer: Self-pay | Admitting: Internal Medicine

## 2011-10-30 ENCOUNTER — Ambulatory Visit (HOSPITAL_BASED_OUTPATIENT_CLINIC_OR_DEPARTMENT_OTHER): Payer: Medicare Other

## 2011-10-30 VITALS — BP 127/77 | HR 77 | Temp 97.7°F

## 2011-10-30 DIAGNOSIS — Z5111 Encounter for antineoplastic chemotherapy: Secondary | ICD-10-CM

## 2011-10-30 DIAGNOSIS — C8588 Other specified types of non-Hodgkin lymphoma, lymph nodes of multiple sites: Secondary | ICD-10-CM

## 2011-10-30 DIAGNOSIS — C8590 Non-Hodgkin lymphoma, unspecified, unspecified site: Secondary | ICD-10-CM

## 2011-10-30 MED ORDER — ONDANSETRON 8 MG/50ML IVPB (CHCC)
8.0000 mg | Freq: Once | INTRAVENOUS | Status: AC
  Administered 2011-10-30: 8 mg via INTRAVENOUS

## 2011-10-30 MED ORDER — SODIUM CHLORIDE 0.9 % IV SOLN
Freq: Once | INTRAVENOUS | Status: AC
  Administered 2011-10-30: 10:00:00 via INTRAVENOUS

## 2011-10-30 MED ORDER — DEXAMETHASONE SODIUM PHOSPHATE 10 MG/ML IJ SOLN
10.0000 mg | Freq: Once | INTRAMUSCULAR | Status: AC
  Administered 2011-10-30: 10 mg via INTRAVENOUS

## 2011-10-30 MED ORDER — SODIUM CHLORIDE 0.9 % IV SOLN
90.0000 mg/m2 | Freq: Once | INTRAVENOUS | Status: AC
  Administered 2011-10-30: 165 mg via INTRAVENOUS
  Filled 2011-10-30: qty 33

## 2011-10-30 NOTE — Patient Instructions (Signed)
[facility] Discharge Instructions for Patients Receiving Chemotherapy  Today you received the following chemotherapy agents Treanda To help prevent nausea and vomiting after your treatment, we encourage you to take your nausea medication as prescribed.If you develop nausea and vomiting that is not controlled by your nausea medication, call the clinic. If it is after clinic hours your family physician or the after hours number for the clinic or go to the Emergency Department.   BELOW ARE SYMPTOMS THAT SHOULD BE REPORTED IMMEDIATELY:  *FEVER GREATER THAN 100.5 F  *CHILLS WITH OR WITHOUT FEVER  NAUSEA AND VOMITING THAT IS NOT CONTROLLED WITH YOUR NAUSEA MEDICATION  *UNUSUAL SHORTNESS OF BREATH  *UNUSUAL BRUISING OR BLEEDING  TENDERNESS IN MOUTH AND THROAT WITH OR WITHOUT PRESENCE OF ULCERS  *URINARY PROBLEMS  *BOWEL PROBLEMS  UNUSUAL RASH Items with * indicate a potential emergency and should be followed up as soon as possible.  One of the nurses will contact you 24 hours after your treatment. Please let the nurse know about any problems that you may have experienced. Feel free to call the clinic you have any questions or concerns. The clinic phone number is [phone].   I have been informed and understand all the instructions given to me. I know to contact the clinic, my physician, or go to the Emergency Department if any problems should occur. I do not have any questions at this time, but understand that I may call the clinic during office hours   should I have any questions or need assistance in obtaining follow up care.    __________________________________________  _____________  __________ Signature of Patient or Authorized Representative            Date                   Time    __________________________________________ Nurse's Signature    

## 2011-10-30 NOTE — Progress Notes (Signed)
Patient returned for chemotherapy today and remember that he needed a refill for his ranitidine. Patient given a prescription for ranitidine 150 mg by mouth twice daily a total of 60 with no refill  Laural Benes, Demitrus Francisco E, PA-C

## 2011-10-30 NOTE — Telephone Encounter (Signed)
appts made and ptrinted for pt aom °

## 2011-11-19 ENCOUNTER — Ambulatory Visit (HOSPITAL_COMMUNITY): Payer: Medicare Other | Attending: Physician Assistant

## 2011-11-19 ENCOUNTER — Other Ambulatory Visit: Payer: Self-pay | Admitting: Medical Oncology

## 2011-11-19 ENCOUNTER — Telehealth: Payer: Self-pay | Admitting: Internal Medicine

## 2011-11-19 DIAGNOSIS — R600 Localized edema: Secondary | ICD-10-CM

## 2011-11-19 DIAGNOSIS — C8590 Non-Hodgkin lymphoma, unspecified, unspecified site: Secondary | ICD-10-CM

## 2011-11-19 NOTE — Telephone Encounter (Signed)
called pt and ,made appt  for u/s   aware aom

## 2011-11-23 ENCOUNTER — Encounter (HOSPITAL_COMMUNITY): Payer: Self-pay

## 2011-11-23 ENCOUNTER — Other Ambulatory Visit (HOSPITAL_BASED_OUTPATIENT_CLINIC_OR_DEPARTMENT_OTHER): Payer: Medicare Other | Admitting: Lab

## 2011-11-23 ENCOUNTER — Telehealth: Payer: Self-pay | Admitting: *Deleted

## 2011-11-23 ENCOUNTER — Encounter (HOSPITAL_COMMUNITY)
Admission: RE | Admit: 2011-11-23 | Discharge: 2011-11-23 | Disposition: A | Discharge status: Home / Self Care | Payer: Medicare Other | Source: Ambulatory Visit | Attending: Physician Assistant | Admitting: Physician Assistant

## 2011-11-23 DIAGNOSIS — N400 Enlarged prostate without lower urinary tract symptoms: Secondary | ICD-10-CM | POA: Insufficient documentation

## 2011-11-23 DIAGNOSIS — K5730 Diverticulosis of large intestine without perforation or abscess without bleeding: Secondary | ICD-10-CM | POA: Insufficient documentation

## 2011-11-23 DIAGNOSIS — J320 Chronic maxillary sinusitis: Secondary | ICD-10-CM | POA: Insufficient documentation

## 2011-11-23 DIAGNOSIS — I2510 Atherosclerotic heart disease of native coronary artery without angina pectoris: Secondary | ICD-10-CM | POA: Insufficient documentation

## 2011-11-23 DIAGNOSIS — C8590 Non-Hodgkin lymphoma, unspecified, unspecified site: Secondary | ICD-10-CM

## 2011-11-23 DIAGNOSIS — I708 Atherosclerosis of other arteries: Secondary | ICD-10-CM | POA: Insufficient documentation

## 2011-11-23 DIAGNOSIS — C8589 Other specified types of non-Hodgkin lymphoma, extranodal and solid organ sites: Secondary | ICD-10-CM | POA: Insufficient documentation

## 2011-11-23 DIAGNOSIS — N281 Cyst of kidney, acquired: Secondary | ICD-10-CM | POA: Insufficient documentation

## 2011-11-23 DIAGNOSIS — I700 Atherosclerosis of aorta: Secondary | ICD-10-CM | POA: Insufficient documentation

## 2011-11-23 LAB — CBC WITH DIFFERENTIAL/PLATELET
BASO%: 1.1 % (ref 0.0–2.0)
Basophils Absolute: 0 10*3/uL (ref 0.0–0.1)
EOS%: 9.9 % — ABNORMAL HIGH (ref 0.0–7.0)
HGB: 13.1 g/dL (ref 13.0–17.1)
MCH: 31.8 pg (ref 27.2–33.4)
MCHC: 34.4 g/dL (ref 32.0–36.0)
MCV: 92.5 fL (ref 79.3–98.0)
MONO%: 23.8 % — ABNORMAL HIGH (ref 0.0–14.0)
RBC: 4.13 10*6/uL — ABNORMAL LOW (ref 4.20–5.82)
RDW: 14.6 % (ref 11.0–14.6)

## 2011-11-23 LAB — COMPREHENSIVE METABOLIC PANEL (CC13)
AST: 25 U/L (ref 5–34)
Albumin: 3.5 g/dL (ref 3.5–5.0)
Alkaline Phosphatase: 114 U/L (ref 40–150)
BUN: 10 mg/dL (ref 7.0–26.0)
Potassium: 3.8 mEq/L (ref 3.5–5.1)

## 2011-11-23 LAB — GLUCOSE, CAPILLARY: Glucose-Capillary: 96 mg/dL (ref 70–99)

## 2011-11-23 MED ORDER — FLUDEOXYGLUCOSE F - 18 (FDG) INJECTION
17.0000 | Freq: Once | INTRAVENOUS | Status: AC | PRN
  Administered 2011-11-23: 17 via INTRAVENOUS

## 2011-11-23 NOTE — Telephone Encounter (Signed)
Called to inform pt that his ANC is slightly low so he is at risk of infection, he is to call if he develops any fever, pt verbalized understanding.  SLJ

## 2011-11-27 ENCOUNTER — Telehealth: Payer: Self-pay | Admitting: Internal Medicine

## 2011-11-27 ENCOUNTER — Ambulatory Visit (HOSPITAL_BASED_OUTPATIENT_CLINIC_OR_DEPARTMENT_OTHER): Payer: Medicare Other | Admitting: Internal Medicine

## 2011-11-27 VITALS — BP 124/71 | HR 73 | Temp 97.1°F | Resp 20 | Ht 66.0 in | Wt 150.3 lb

## 2011-11-27 DIAGNOSIS — C8588 Other specified types of non-Hodgkin lymphoma, lymph nodes of multiple sites: Secondary | ICD-10-CM

## 2011-11-27 DIAGNOSIS — C8590 Non-Hodgkin lymphoma, unspecified, unspecified site: Secondary | ICD-10-CM

## 2011-11-27 NOTE — Patient Instructions (Signed)
There is no evidence for residual or recurrent disease on the recent PET scan. We discussed observation versus maintenance treatment with Rituxan. He prefers to proceed with maintenance Rituxan. Will start the first cycle of this treatment in 2 months.

## 2011-11-27 NOTE — Progress Notes (Signed)
[facility] Telephone:(336) [phone]   Fax:(336) [phone]  OFFICE PROGRESS NOTE  Tally Due, MD [address] [place] [address]  PRINCIPAL DIAGNOSIS: Bulky stage IV low grade lymphoma diagnosed in November 2009.   PRIOR THERAPY:  1. Status post 7 cycles of systemic chemotherapy with CHOP/Rituxan. Last dose was given May 2010. 2. Status post 6 cycles of maintenance Rituxan 375 mg/sq m given every 2 months. Last dose was given on May 29, 2010, and the patient was lost to followup at that time. He received another dose on 12/11/2010, then again missed 2 doses. 3. systemic chemotherapy with Rituxan 375 mg/M2 on day 1 and that bendamustine 90 mg/M2 on days 1 and 2 every 4 weeks, status post 6 cycles.  CURRENT THERAPY: The patient will start in 2 months after his dose of maintenance Rituxan 375 mg/M2 every 2 months.  INTERVAL HISTORY: Jordie Schreur 71 y.o. male returns to the clinic today for followup visit. The patient tolerated the last 6 cycles of systemic therapy with Rituxan and bendamustine fairly well with no significant adverse effects. He denied having any significant weight loss or night sweats. He has no palpable lymphadenopathy. The patient denied having any significant chest pain, shortness breath, cough or hemoptysis. He has repeat PET scan performed recently and he is here for evaluation and discussion of his scan results and recommendation regarding treatment of his condition.  ALLERGIES:  is allergic to neosporin.  MEDICATIONS:  Current Outpatient Prescriptions  Medication Sig Dispense Refill  . cyanocobalamin 1000 MCG tablet Take 100 mcg by mouth daily.      . ferrous sulfate 325 (65 FE) MG tablet Take 325 mg by mouth daily with breakfast.      . omeprazole (PRILOSEC) 40 MG capsule Take 40 mg by mouth daily.      . ranitidine (ZANTAC) 150 MG tablet Take 150 mg by mouth 2 (two) times daily. Alternates with omeperazole      . zolpidem  (AMBIEN) 10 MG tablet Take 1/2 to 1 tablet by mouth at bedtime as needed for sleep  30 tablet  0    REVIEW OF SYSTEMS:  A comprehensive review of systems was negative.   PHYSICAL EXAMINATION: General appearance: alert, cooperative and no distress Head: Normocephalic, without obvious abnormality, atraumatic Neck: no adenopathy Lymph nodes: Cervical, supraclavicular, and axillary nodes normal. Resp: clear to auscultation bilaterally Cardio: regular rate and rhythm, S1, S2 normal, no murmur, click, rub or gallop GI: soft, non-tender; bowel sounds normal; no masses,  no organomegaly Extremities: extremities normal, atraumatic, no cyanosis or edema Neurologic: Alert and oriented X 3, normal strength and tone. Normal symmetric reflexes. Normal coordination and gait  ECOG PERFORMANCE STATUS: 1 - Symptomatic but completely ambulatory  Blood pressure 124/71, pulse 73, temperature 97.1 F (36.2 C), temperature source Oral, resp. rate 20, height 5\' 6"  (1.676 m), weight 150 lb 4.8 oz (68.176 kg).  LABORATORY DATA: Lab Results  Component Value Date   WBC 3.9* 11/23/2011   HGB 13.1 11/23/2011   HCT 38.2* 11/23/2011   MCV 92.5 11/23/2011   PLT 195 11/23/2011      Chemistry      Component Value Date/Time   NA 142 11/23/2011 0800   NA 143 10/22/2011 0834   NA 141 03/23/2010 1301   K 3.8 11/23/2011 0800   K 4.2 10/22/2011 0834   K 4.7 03/23/2010 1301   CL 108* 11/23/2011 0800   CL 105 10/22/2011 0834  CL 99 03/23/2010 1301   CO2 23 11/23/2011 0800   CO2 27 10/22/2011 0834   CO2 26 03/23/2010 1301   BUN 10.0 11/23/2011 0800   BUN 13 10/22/2011 0834   BUN 12 03/23/2010 1301   CREATININE 0.9 11/23/2011 0800   CREATININE 0.92 10/22/2011 0834   CREATININE 1.0 03/23/2010 1301      Component Value Date/Time   CALCIUM 9.2 11/23/2011 0800   CALCIUM 8.8 10/22/2011 0834   CALCIUM 9.1 03/23/2010 1301   ALKPHOS 114 11/23/2011 0800   ALKPHOS 101 10/22/2011 0834   ALKPHOS 78 03/23/2010 1301   AST 25 11/23/2011 0800    AST 22 10/22/2011 0834   AST 25 03/23/2010 1301   ALT 19 11/23/2011 0800   ALT 19 10/22/2011 0834   BILITOT 0.50 11/23/2011 0800   BILITOT 0.4 10/22/2011 0834   BILITOT 0.60 03/23/2010 1301       RADIOGRAPHIC STUDIES: Nm Pet Image Restag (ps) Skull Base To Thigh  11/23/2011  *RADIOLOGY REPORT*  Clinical Data: Subsequent treatment strategy for lymphoma.  NUCLEAR MEDICINE PET SKULL BASE TO THIGH  Fasting Blood Glucose:  96  Technique:  17.0 mCi F-18 FDG was injected intravenously. CT data was obtained and used for attenuation correction and anatomic localization only.  (This was not acquired as a diagnostic CT examination.) Additional exam technical data entered on technologist worksheet.  Comparison:  04/28/2008; 08/30/2011  Findings:  Neck: Symmetric salivary gland, tonsillar, and tongue activity appears benign.  No hypermetabolic adenopathy in the neck. Incidental mucous retention cyst right maxillary sinus.  Chest:  No pathologic hypermetabolic activity the chest.  Coronary artery calcification noted.  Old granulomatous disease noted.  Abdomen/Pelvis:  No abnormal hypermetabolic nodal activity in the abdomen or pelvis.  Note is again made of marked enlargement prostate gland with generalized low-level prostate activity.  Sigmoid diverticulosis noted with aortoiliac atherosclerosis. Photopenic right renal cyst.  Partially duplicated left collecting system.  Skeleton:  No focal hypermetabolic activity to suggest skeletal metastasis.  IMPRESSION:  1.  No findings of residual hypermetabolic adenopathy to suggest residual or recurrent malignancy. 2.  Chronic right maxillary sinusitis. 3.  Sigmoid diverticulosis. 4.  Right renal cyst. 5.  Prominently enlarged but stable prostate gland. 6.  Partially duplicated left collecting system. 7.  Coronary artery atherosclerosis.   Original Report Authenticated By: Dellia Cloud, M.D.     ASSESSMENT: This is a very pleasant 71 years old [place] American male with  history of bulky stage IV low-grade lymphoma most recently completed 6 cycles of systemic therapy with Rituxan and bendamustine was almost complete response and no evidence for residual hypermetabolic lymphadenopathy on the scan.  PLAN: I discussed the scan results with the patient. I recommended for him to consider maintenance Rituxan 375 mg/M2 for 12 doses. He was also given him the option of observation. The patient was interested on the maintenance approach. He will start the first cycle of this treatment in 2 months. He would come back for followup visit at that time. He was advised to call me immediately if he has any concerning symptoms in the interval.  All questions were answered. The patient knows to call the clinic with any problems, questions or concerns. We can certainly see the patient much sooner if necessary.  I spent 15 minutes counseling the patient face to face. The total time spent in the appointment was 25 minutes.

## 2011-11-27 NOTE — Telephone Encounter (Signed)
appts made and printed for  Pt aom °

## 2012-01-28 ENCOUNTER — Encounter: Payer: Self-pay | Admitting: Pharmacist

## 2012-01-29 ENCOUNTER — Other Ambulatory Visit: Payer: Self-pay | Admitting: Physician Assistant

## 2012-01-29 ENCOUNTER — Ambulatory Visit (HOSPITAL_BASED_OUTPATIENT_CLINIC_OR_DEPARTMENT_OTHER): Payer: Medicare Other | Admitting: Physician Assistant

## 2012-01-29 ENCOUNTER — Encounter: Payer: Self-pay | Admitting: Physician Assistant

## 2012-01-29 ENCOUNTER — Telehealth: Payer: Self-pay | Admitting: Internal Medicine

## 2012-01-29 ENCOUNTER — Ambulatory Visit (HOSPITAL_BASED_OUTPATIENT_CLINIC_OR_DEPARTMENT_OTHER): Payer: Medicare Other

## 2012-01-29 ENCOUNTER — Other Ambulatory Visit (HOSPITAL_BASED_OUTPATIENT_CLINIC_OR_DEPARTMENT_OTHER): Payer: Medicare Other | Admitting: Lab

## 2012-01-29 VITALS — BP 159/81 | HR 59 | Temp 97.0°F

## 2012-01-29 VITALS — BP 130/82 | HR 75 | Temp 97.3°F | Resp 20 | Ht 66.0 in | Wt 153.0 lb

## 2012-01-29 DIAGNOSIS — C8590 Non-Hodgkin lymphoma, unspecified, unspecified site: Secondary | ICD-10-CM

## 2012-01-29 DIAGNOSIS — C8589 Other specified types of non-Hodgkin lymphoma, extranodal and solid organ sites: Secondary | ICD-10-CM

## 2012-01-29 DIAGNOSIS — Z5112 Encounter for antineoplastic immunotherapy: Secondary | ICD-10-CM

## 2012-01-29 LAB — COMPREHENSIVE METABOLIC PANEL (CC13)
Albumin: 3.7 g/dL (ref 3.5–5.0)
BUN: 12 mg/dL (ref 7.0–26.0)
Calcium: 8.8 mg/dL (ref 8.4–10.4)
Chloride: 106 mEq/L (ref 98–107)
Creatinine: 0.8 mg/dL (ref 0.7–1.3)
Glucose: 78 mg/dl (ref 70–99)
Potassium: 3.8 mEq/L (ref 3.5–5.1)

## 2012-01-29 LAB — CBC WITH DIFFERENTIAL/PLATELET
BASO%: 0.4 % (ref 0.0–2.0)
Basophils Absolute: 0 10*3/uL (ref 0.0–0.1)
EOS%: 2.8 % (ref 0.0–7.0)
HCT: 39.9 % (ref 38.4–49.9)
HGB: 13.5 g/dL (ref 13.0–17.1)
LYMPH%: 35.1 % (ref 14.0–49.0)
MCH: 30.8 pg (ref 27.2–33.4)
MCHC: 33.8 g/dL (ref 32.0–36.0)
MONO#: 0.7 10*3/uL (ref 0.1–0.9)
NEUT%: 46.8 % (ref 39.0–75.0)
Platelets: 213 10*3/uL (ref 140–400)
lymph#: 1.7 10*3/uL (ref 0.9–3.3)

## 2012-01-29 LAB — LACTATE DEHYDROGENASE (CC13): LDH: 218 U/L (ref 125–245)

## 2012-01-29 MED ORDER — ACETAMINOPHEN 325 MG PO TABS
650.0000 mg | ORAL_TABLET | Freq: Once | ORAL | Status: AC
  Administered 2012-01-29: 650 mg via ORAL

## 2012-01-29 MED ORDER — SODIUM CHLORIDE 0.9 % IV SOLN
Freq: Once | INTRAVENOUS | Status: AC
  Administered 2012-01-29: 10:00:00 via INTRAVENOUS

## 2012-01-29 MED ORDER — DIPHENHYDRAMINE HCL 25 MG PO CAPS
50.0000 mg | ORAL_CAPSULE | Freq: Once | ORAL | Status: AC
  Administered 2012-01-29: 50 mg via ORAL

## 2012-01-29 MED ORDER — SODIUM CHLORIDE 0.9 % IV SOLN
375.0000 mg/m2 | Freq: Once | INTRAVENOUS | Status: AC
  Administered 2012-01-29: 700 mg via INTRAVENOUS
  Filled 2012-01-29: qty 70

## 2012-01-29 NOTE — Patient Instructions (Addendum)
Follow up in 2 months prior to your next cycle of maintenance Rituxan 

## 2012-01-29 NOTE — Telephone Encounter (Signed)
appts made and printed for pt aom °

## 2012-01-29 NOTE — Patient Instructions (Signed)
[facility] Discharge Instructions for Patients Receiving Chemotherapy  Today you received the following chemotherapy agents rituxan  To help prevent nausea and vomiting after your treatment, we encourage you to take your nausea medication if needed.    If you develop nausea and vomiting that is not controlled by your nausea medication, call the clinic. If it is after clinic hours your family physician or the after hours number for the clinic or go to the Emergency Department.   BELOW ARE SYMPTOMS THAT SHOULD BE REPORTED IMMEDIATELY:  *FEVER GREATER THAN 100.5 F  *CHILLS WITH OR WITHOUT FEVER  NAUSEA AND VOMITING THAT IS NOT CONTROLLED WITH YOUR NAUSEA MEDICATION  *UNUSUAL SHORTNESS OF BREATH  *UNUSUAL BRUISING OR BLEEDING  TENDERNESS IN MOUTH AND THROAT WITH OR WITHOUT PRESENCE OF ULCERS  *URINARY PROBLEMS  *BOWEL PROBLEMS  UNUSUAL RASH Items with * indicate a potential emergency and should be followed up as soon as possible.  . Feel free to call the clinic you have any questions or concerns. The clinic phone number is [phone].   I have been informed and understand all the instructions given to me. I know to contact the clinic, my physician, or go to the Emergency Department if any problems should occur. I do not have any questions at this time, but understand that I may call the clinic during office hours   should I have any questions or need assistance in obtaining follow up care.    __________________________________________  _____________  __________ Signature of Patient or Authorized Representative            Date                   Time    __________________________________________ Nurse's Signature

## 2012-01-29 NOTE — Progress Notes (Signed)
[facility] Telephone:(336) [phone]   Fax:(336) [phone]  OFFICE PROGRESS NOTE  Tally Due, MD [address] [place] [address]  PRINCIPAL DIAGNOSIS: Bulky stage IV low grade lymphoma diagnosed in November 2009.   PRIOR THERAPY:  1. Status post 7 cycles of systemic chemotherapy with CHOP/Rituxan. Last dose was given May 2010. 2. Status post 6 cycles of maintenance Rituxan 375 mg/sq m given every 2 months. Last dose was given on May 29, 2010, and the patient was lost to followup at that time. He received another dose on 12/11/2010, then again missed 2 doses. 3. systemic chemotherapy with Rituxan 375 mg/M2 on day 1 and that bendamustine 90 mg/M2 on days 1 and 2 every 4 weeks, status post 6 cycles.  CURRENT THERAPY:  Maintenance Rituxan 375 mg/M2 every 2 months, first cycle to be given today  INTERVAL HISTORY: Eric [name] 71 y.o. male returns to the clinic today for followup visit. The patient tolerated the last 6 cycles of systemic therapy with Rituxan and bendamustine fairly well with no significant adverse effects. He denied having any significant weight loss or night sweats. He has no palpable lymphadenopathy. The patient denied having any significant chest pain, shortness breath, cough or hemoptysis. He voices no specific complaints today. He presents to proceed with his first cycle of maintenance Rituxan to be given every 2 months.   ALLERGIES:  is allergic to neosporin.  MEDICATIONS:  Current Outpatient Prescriptions  Medication Sig Dispense Refill  . cyanocobalamin 1000 MCG tablet Take 100 mcg by mouth daily.      . ferrous sulfate 325 (65 FE) MG tablet Take 325 mg by mouth daily with breakfast.      . ranitidine (ZANTAC) 150 MG tablet Take 150 mg by mouth 2 (two) times daily. Alternates with omeperazole      . zolpidem (AMBIEN) 10 MG tablet Take 1/2 to 1 tablet by mouth at bedtime as needed for sleep  30 tablet  0    REVIEW OF SYSTEMS:  A  comprehensive review of systems was negative.   PHYSICAL EXAMINATION: General appearance: alert, cooperative and no distress Head: Normocephalic, without obvious abnormality, atraumatic Neck: no adenopathy Lymph nodes: Cervical, supraclavicular, and axillary nodes normal. Resp: clear to auscultation bilaterally Cardio: regular rate and rhythm, S1, S2 normal, no murmur, click, rub or gallop GI: soft, non-tender; bowel sounds normal; no masses,  no organomegaly Extremities: extremities normal, atraumatic, no cyanosis or edema Neurologic: Alert and oriented X 3, normal strength and tone. Normal symmetric reflexes. Normal coordination and gait  ECOG PERFORMANCE STATUS: 1 - Symptomatic but completely ambulatory  Blood pressure 130/82, pulse 75, temperature 97.3 F (36.3 C), temperature source Oral, resp. rate 20, height 5\' 6"  (1.676 m), weight 153 lb (69.4 kg).  LABORATORY DATA: Lab Results  Component Value Date   WBC 4.7 01/29/2012   HGB 13.5 01/29/2012   HCT 39.9 01/29/2012   MCV 90.9 01/29/2012   PLT 213 01/29/2012      Chemistry      Component Value Date/Time   NA 142 11/23/2011 0800   NA 143 10/22/2011 0834   NA 141 03/23/2010 1301   K 3.8 11/23/2011 0800   K 4.2 10/22/2011 0834   K 4.7 03/23/2010 1301   CL 108* 11/23/2011 0800   CL 105 10/22/2011 0834   CL 99 03/23/2010 1301   CO2 23 11/23/2011 0800   CO2 27 10/22/2011 0834   CO2 26 03/23/2010 1301  BUN 10.0 11/23/2011 0800   BUN 13 10/22/2011 0834   BUN 12 03/23/2010 1301   CREATININE 0.9 11/23/2011 0800   CREATININE 0.92 10/22/2011 0834   CREATININE 1.0 03/23/2010 1301      Component Value Date/Time   CALCIUM 9.2 11/23/2011 0800   CALCIUM 8.8 10/22/2011 0834   CALCIUM 9.1 03/23/2010 1301   ALKPHOS 114 11/23/2011 0800   ALKPHOS 101 10/22/2011 0834   ALKPHOS 78 03/23/2010 1301   AST 25 11/23/2011 0800   AST 22 10/22/2011 0834   AST 25 03/23/2010 1301   ALT 19 11/23/2011 0800   ALT 19 10/22/2011 0834   BILITOT 0.50 11/23/2011 0800    BILITOT 0.4 10/22/2011 0834   BILITOT 0.60 03/23/2010 1301       RADIOGRAPHIC STUDIES: Nm Pet Image Restag (ps) Skull Base To Thigh  11/23/2011  *RADIOLOGY REPORT*  Clinical Data: Subsequent treatment strategy for lymphoma.  NUCLEAR MEDICINE PET SKULL BASE TO THIGH  Fasting Blood Glucose:  96  Technique:  17.0 mCi F-18 FDG was injected intravenously. CT data was obtained and used for attenuation correction and anatomic localization only.  (This was not acquired as a diagnostic CT examination.) Additional exam technical data entered on technologist worksheet.  Comparison:  04/28/2008; 08/30/2011  Findings:  Neck: Symmetric salivary gland, tonsillar, and tongue activity appears benign.  No hypermetabolic adenopathy in the neck. Incidental mucous retention cyst right maxillary sinus.  Chest:  No pathologic hypermetabolic activity the chest.  Coronary artery calcification noted.  Old granulomatous disease noted.  Abdomen/Pelvis:  No abnormal hypermetabolic nodal activity in the abdomen or pelvis.  Note is again made of marked enlargement prostate gland with generalized low-level prostate activity.  Sigmoid diverticulosis noted with aortoiliac atherosclerosis. Photopenic right renal cyst.  Partially duplicated left collecting system.  Skeleton:  No focal hypermetabolic activity to suggest skeletal metastasis.  IMPRESSION:  1.  No findings of residual hypermetabolic adenopathy to suggest residual or recurrent malignancy. 2.  Chronic right maxillary sinusitis. 3.  Sigmoid diverticulosis. 4.  Right renal cyst. 5.  Prominently enlarged but stable prostate gland. 6.  Partially duplicated left collecting system. 7.  Coronary artery atherosclerosis.   Original Report Authenticated By: Dellia Cloud, M.D.     ASSESSMENT/PLAN: This is a very pleasant 71 years old [place] American male with history of bulky stage IV low-grade lymphoma most recently completed 6 cycles of systemic therapy with Rituxan and  bendamustine was almost complete response and no evidence for residual hypermetabolic lymphadenopathy on the scan. Patient was discussed with Dr. Arbutus Ped. He will proceed with his first cycle of maintenance Rituxan 375 mg per meter squared given every 2 months. A total of 12 cycles are planned. He'll return in 2 months with repeat CBC differential, C. met and LDH prior to his next cycle of maintenance Rituxan.  Laural Benes, Chey Rachels E, PA-C   All questions were answered. The patient knows to call the clinic with any problems, questions or concerns. We can certainly see the patient much sooner if necessary.  I spent 20 minutes counseling the patient face to face. The total time spent in the appointment was 30 minutes.

## 2012-03-02 ENCOUNTER — Inpatient Hospital Stay (HOSPITAL_COMMUNITY)
Admission: EM | Admit: 2012-03-02 | Discharge: 2012-03-06 | DRG: 247 | Disposition: A | Discharge status: Home / Self Care | Payer: Medicare Other | Attending: Interventional Cardiology | Admitting: Interventional Cardiology

## 2012-03-02 ENCOUNTER — Other Ambulatory Visit: Payer: Self-pay

## 2012-03-02 ENCOUNTER — Encounter (HOSPITAL_COMMUNITY): Payer: Self-pay | Admitting: Emergency Medicine

## 2012-03-02 ENCOUNTER — Emergency Department (HOSPITAL_COMMUNITY): Payer: Medicare Other

## 2012-03-02 DIAGNOSIS — I2510 Atherosclerotic heart disease of native coronary artery without angina pectoris: Secondary | ICD-10-CM | POA: Diagnosis present

## 2012-03-02 DIAGNOSIS — C8589 Other specified types of non-Hodgkin lymphoma, extranodal and solid organ sites: Secondary | ICD-10-CM | POA: Diagnosis present

## 2012-03-02 DIAGNOSIS — I214 Non-ST elevation (NSTEMI) myocardial infarction: Principal | ICD-10-CM | POA: Diagnosis present

## 2012-03-02 DIAGNOSIS — R079 Chest pain, unspecified: Secondary | ICD-10-CM

## 2012-03-02 DIAGNOSIS — T45525A Adverse effect of antithrombotic drugs, initial encounter: Secondary | ICD-10-CM

## 2012-03-02 DIAGNOSIS — Z955 Presence of coronary angioplasty implant and graft: Secondary | ICD-10-CM

## 2012-03-02 DIAGNOSIS — C8590 Non-Hodgkin lymphoma, unspecified, unspecified site: Secondary | ICD-10-CM

## 2012-03-02 HISTORY — DX: Malignant (primary) neoplasm, unspecified: C80.1

## 2012-03-02 HISTORY — DX: Non-Hodgkin lymphoma, unspecified, unspecified site: C85.90

## 2012-03-02 LAB — BASIC METABOLIC PANEL
CO2: 26 mEq/L (ref 19–32)
Chloride: 107 mEq/L (ref 96–112)
GFR calc non Af Amer: 81 mL/min — ABNORMAL LOW (ref >90)
Glucose, Bld: 108 mg/dL — ABNORMAL HIGH (ref 70–99)
Potassium: 4 mEq/L (ref 3.5–5.1)
Sodium: 142 mEq/L (ref 135–145)

## 2012-03-02 LAB — PROTIME-INR
INR: 1.01 (ref 0.00–1.49)
Prothrombin Time: 13.2 seconds (ref 11.6–15.2)

## 2012-03-02 LAB — MRSA PCR SCREENING: MRSA by PCR: NEGATIVE

## 2012-03-02 LAB — CBC WITH DIFFERENTIAL/PLATELET
Eosinophils Absolute: 0 10*3/uL (ref 0.0–0.7)
Hemoglobin: 12.4 g/dL — ABNORMAL LOW (ref 13.0–17.0)
Lymphocytes Relative: 23 % (ref 12–46)
Lymphs Abs: 0.9 10*3/uL (ref 0.7–4.0)
Monocytes Relative: 13 % — ABNORMAL HIGH (ref 3–12)
Neutro Abs: 2.6 10*3/uL (ref 1.7–7.7)
Neutrophils Relative %: 64 % (ref 43–77)
Platelets: 222 10*3/uL (ref 150–400)
RBC: 4.08 MIL/uL — ABNORMAL LOW (ref 4.22–5.81)
WBC: 4.1 10*3/uL (ref 4.0–10.5)

## 2012-03-02 LAB — D-DIMER, QUANTITATIVE: D-Dimer, Quant: <0.27 ug/mL-FEU (ref 0.00–0.48)

## 2012-03-02 MED ORDER — HEPARIN BOLUS VIA INFUSION
4000.0000 [IU] | Freq: Once | INTRAVENOUS | Status: DC
  Administered 2012-03-02: 4000 [IU] via INTRAVENOUS

## 2012-03-02 MED ORDER — SODIUM CHLORIDE 0.9 % IV BOLUS (SEPSIS)
500.0000 mL | Freq: Once | INTRAVENOUS | Status: AC
  Administered 2012-03-02: 15:00:00 via INTRAVENOUS

## 2012-03-02 MED ORDER — HEPARIN (PORCINE) IN NACL 100-0.45 UNIT/ML-% IJ SOLN: 12.0000 [IU]/kg/h | INTRAMUSCULAR | Status: DC

## 2012-03-02 MED ORDER — FAMOTIDINE 20 MG PO TABS
20.0000 mg | ORAL_TABLET | Freq: Every day | ORAL | Status: DC
  Administered 2012-03-03 – 2012-03-05 (×3): 20 mg via ORAL
  Filled 2012-03-02 (×4): qty 1

## 2012-03-02 MED ORDER — HEPARIN BOLUS VIA INFUSION: 4000.0000 [IU] | Freq: Once | INTRAVENOUS | Status: DC

## 2012-03-02 MED ORDER — NITROGLYCERIN IN D5W 200-5 MCG/ML-% IV SOLN
5.0000 ug/min | INTRAVENOUS | Status: DC
  Filled 2012-03-02: qty 250

## 2012-03-02 MED ORDER — SODIUM CHLORIDE 0.9 % IV BOLUS (SEPSIS): 500.0000 mL | Freq: Once | INTRAVENOUS | Status: DC

## 2012-03-02 MED ORDER — HEPARIN (PORCINE) IN NACL 100-0.45 UNIT/ML-% IJ SOLN
12.0000 [IU]/kg/h | INTRAMUSCULAR | Status: DC
  Administered 2012-03-02: 12 [IU]/kg/h via INTRAVENOUS
  Filled 2012-03-02: qty 250

## 2012-03-02 MED ORDER — SODIUM CHLORIDE 0.9 % IV SOLN
INTRAVENOUS | Status: DC
  Administered 2012-03-02: 10 mL/h via INTRAVENOUS
  Administered 2012-03-03: 23:00:00 via INTRAVENOUS

## 2012-03-02 MED ORDER — NITROGLYCERIN 0.4 MG SL SUBL: 0.4000 mg | SUBLINGUAL_TABLET | SUBLINGUAL | Status: DC | PRN

## 2012-03-02 MED ORDER — ONDANSETRON HCL 4 MG/2ML IJ SOLN: 4.0000 mg | Freq: Four times a day (QID) | INTRAMUSCULAR | Status: DC | PRN

## 2012-03-02 MED ORDER — ONDANSETRON HCL 4 MG/2ML IJ SOLN: 4.0000 mg | Freq: Once | INTRAMUSCULAR | Status: DC

## 2012-03-02 MED ORDER — ASPIRIN 300 MG RE SUPP
300.0000 mg | RECTAL | Status: AC
  Filled 2012-03-02: qty 1

## 2012-03-02 MED ORDER — SODIUM CHLORIDE 0.9 % IV BOLUS (SEPSIS)
500.0000 mL | Freq: Once | INTRAVENOUS | Status: AC
  Administered 2012-03-02: 500 mL via INTRAVENOUS

## 2012-03-02 MED ORDER — ASPIRIN EC 81 MG PO TBEC
81.0000 mg | DELAYED_RELEASE_TABLET | Freq: Every day | ORAL | Status: DC
  Administered 2012-03-03: 81 mg via ORAL
  Filled 2012-03-02 (×2): qty 1

## 2012-03-02 MED ORDER — ACETAMINOPHEN 325 MG PO TABS
650.0000 mg | ORAL_TABLET | ORAL | Status: DC | PRN
  Administered 2012-03-02: 650 mg via ORAL
  Filled 2012-03-02: qty 2

## 2012-03-02 MED ORDER — NITROGLYCERIN 0.4 MG SL SUBL
0.4000 mg | SUBLINGUAL_TABLET | SUBLINGUAL | Status: DC | PRN
  Administered 2012-03-02: 0.4 mg via SUBLINGUAL
  Filled 2012-03-02: qty 25

## 2012-03-02 MED ORDER — ASPIRIN 81 MG PO CHEW
324.0000 mg | CHEWABLE_TABLET | ORAL | Status: AC
  Administered 2012-03-02: 324 mg via ORAL
  Filled 2012-03-02: qty 4

## 2012-03-02 MED ORDER — VITAMIN B-12 100 MCG PO TABS
100.0000 ug | ORAL_TABLET | Freq: Every day | ORAL | Status: DC
  Administered 2012-03-03: 100 ug via ORAL
  Filled 2012-03-02 (×2): qty 1

## 2012-03-02 MED ORDER — MORPHINE SULFATE 4 MG/ML IJ SOLN
4.0000 mg | Freq: Once | INTRAMUSCULAR | Status: AC
  Administered 2012-03-02: 4 mg via INTRAVENOUS
  Filled 2012-03-02: qty 1

## 2012-03-02 MED ORDER — ASPIRIN 81 MG PO CHEW
324.0000 mg | CHEWABLE_TABLET | Freq: Once | ORAL | Status: AC
  Administered 2012-03-02: 324 mg via ORAL
  Filled 2012-03-02: qty 4

## 2012-03-02 MED ORDER — FERROUS SULFATE 325 (65 FE) MG PO TABS
325.0000 mg | ORAL_TABLET | Freq: Every day | ORAL | Status: DC
  Administered 2012-03-03 – 2012-03-05 (×3): 325 mg via ORAL
  Filled 2012-03-02 (×5): qty 1

## 2012-03-02 NOTE — ED Notes (Signed)
Patient transported to X-ray 

## 2012-03-02 NOTE — ED Provider Notes (Signed)
History     CSN: [id number]  Arrival date & time 03/02/12  1120   First MD Initiated Contact with Patient 03/02/12 1126      Chief Complaint  Patient presents with  . Chest Pain  . Jaw Pain    (Consider location/radiation/quality/duration/timing/severity/associated sxs/prior treatment) HPI Pt with history of lymphoma, currently getting maintenance chemo every 2 months, reports about 3 am he had aching L sided chest pain moving into his L jaw and L arm, not associated with SOB, cough or fever. No diaphoresis or nausea. He states pain improved initially, but returned a few hours ago. No particular provoking or relieving factors. No prior history of same. No known CAD.   Past Medical History  Diagnosis Date  . Cancer   . Non Hodgkin's lymphoma     History reviewed. No pertinent past surgical history.  No family history on file.  History  Substance Use Topics  . Smoking status: Never Smoker   . Smokeless tobacco: Never Used  . Alcohol Use: No     Comment: occasionally      Review of Systems All other systems reviewed and are negative except as noted in HPI.   Allergies  Neosporin  Home Medications   Current Outpatient Rx  Name  Route  Sig  Dispense  Refill  . CYANOCOBALAMIN 1000 MCG PO TABS   Oral   Take 100 mcg by mouth daily.         Marland Kitchen FERROUS SULFATE 325 (65 FE) MG PO TABS   Oral   Take 325 mg by mouth daily with breakfast.         . RANITIDINE HCL 150 MG PO TABS   Oral   Take 150 mg by mouth 2 (two) times daily. Alternates with omeperazole         . ZOLPIDEM TARTRATE 10 MG PO TABS      Take 1/2 to 1 tablet by mouth at bedtime as needed for sleep   30 tablet   0     BP 112/73  Pulse 61  Temp 97.7 F (36.5 C) (Oral)  Resp 18  SpO2 100%  Physical Exam  Nursing note and vitals reviewed. Constitutional: He is oriented to person, place, and time. He appears well-developed and well-nourished.  HENT:  Head: Normocephalic and atraumatic.    Eyes: EOM are normal. Pupils are equal, round, and reactive to light.  Neck: Normal range of motion. Neck supple.  Cardiovascular: Normal rate, normal heart sounds and intact distal pulses.   Pulmonary/Chest: Effort normal and breath sounds normal. He exhibits no tenderness.  Abdominal: Bowel sounds are normal. He exhibits no distension. There is no tenderness.  Musculoskeletal: Normal range of motion. He exhibits no edema and no tenderness.  Neurological: He is alert and oriented to person, place, and time. He has normal strength. No cranial nerve deficit or sensory deficit.  Skin: Skin is warm and dry. No rash noted.  Psychiatric: He has a normal mood and affect.    ED Course  Procedures (including critical care time)  CRITICAL CARE Performed by: Pollyann Savoy.   Total critical care time: 45  Critical care time was exclusive of separately billable procedures and treating other patients.  Critical care was necessary to treat or prevent imminent or life-threatening deterioration.  Critical care was time spent personally by me on the following activities: development of treatment plan with patient and/or surrogate as well as nursing, discussions with consultants, evaluation of patient's response to treatment,  examination of patient, obtaining history from patient or surrogate, ordering and performing treatments and interventions, ordering and review of laboratory studies, ordering and review of radiographic studies, pulse oximetry and re-evaluation of patient's condition.    Labs Reviewed  CBC WITH DIFFERENTIAL - Abnormal; Notable for the following:    RBC 4.08 (*)     Hemoglobin 12.4 (*)     HCT 36.6 (*)     Monocytes Relative 13 (*)     All other components within normal limits  BASIC METABOLIC PANEL - Abnormal; Notable for the following:    Glucose, Bld 108 (*)     GFR calc non Af Amer 81 (*)     All other components within normal limits  TROPONIN I - Abnormal; Notable  for the following:    Troponin I 2.07 (*)     All other components within normal limits  D-DIMER, QUANTITATIVE  PROTIME-INR  APTT   Dg Chest 2 View  03/02/2012  *RADIOLOGY REPORT*  Clinical Data: Chest pain  CHEST - 2 VIEW  Comparison: 08/30/2011  Findings: Normal heart size.  No pleural effusion or edema.  No airspace consolidation identified.  Visualized osseous structures are unremarkable.  IMPRESSION:  1.  No acute cardiopulmonary abnormalities.   Original Report Authenticated By: Signa Kell, M.D.      No diagnosis found.    MDM   Date: 03/02/2012  Rate: 62  Rhythm: normal sinus rhythm  QRS Axis: normal  Intervals: normal  ST/T Wave abnormalities: normal  Conduction Disutrbances: none  Narrative Interpretation: unremarkable  Pt is low risk for PE by Well's criteria (h/o malignancy). Will check troponin and d-dimer. CBC, BMP and CXR. ASA and NTG.   1:45 PM PT still having pain after NTG with drop in BP. Will give morphine, IVF bolus and start Heparin. NTG drip if pain continues and BP improves. Troponin is positive, d-dimer is neg. Discussed with Dr. Garnette Scheuermann who will see the patient in the ED.          Charles B. Bernette Mayers, MD 03/02/12 1346

## 2012-03-02 NOTE — ED Notes (Signed)
Called floor to give report, was instructed to call RN Jonah at [phone] in 5 mins when she will be available.

## 2012-03-02 NOTE — H&P (Addendum)
Eric [name] is a 71 y.o. male  Admit date: 03/02/2012 Referring Physician: [facility] Emergency Room Primary Cardiologist:: Darci Needle, III, M.D. Chief complaint / reason for admission:  Chest pain with elevated troponin I  HPI: Eric [name] is 71 and has a history of non-Hodgkin's lymphoma treated by Dr. Arbutus Ped. He has no significant other medical problems. At around 3 AM, while still awake watching a movie, he developed left subpectoral and precordial discomfort that radiated to the aspect of the left arm and into his jaw. There was no nausea, vomiting, or diaphoresis. The discomfort lasted longer than an hour. He is pain free at this time. There is no prior history of heart disease. He has a difficult time characterizing the quality of the discomfort. In intensity was 5/10 to    PMH:    Past Medical History  Diagnosis Date  . Cancer   . Non Hodgkin's lymphoma     PSH:   History reviewed. No pertinent past surgical history.  ALLERGIES:   Neosporin  Prior to Admit Meds:   (Not in a hospital admission) Family HX:   No family history on file. Social HX:    History   Social History  . Marital Status: Married    Spouse Name: N/A    Number of Children: N/A  . Years of Education: N/A   Occupational History  . Not on file.   Social History Main Topics  . Smoking status: Never Smoker   . Smokeless tobacco: Never Used  . Alcohol Use: No     Comment: occasionally  . Drug Use: No  . Sexually Active:    Other Topics Concern  . Not on file   Social History Narrative  . No narrative on file     ROS: No significant lung disease, liver disease, bleeding history, stroke, CVA, kidney disease, other significant organ problem.  Physical Exam: Blood pressure 97/62, pulse 56, temperature 97.7 F (36.5 C), temperature source Oral, resp. rate 15, height 5\' 7"  (1.702 m), weight 67.132 kg (148 lb), SpO2 99.00%.   Slender, African American or Asian male who is  in no acute distress lying comfortably on the gurney in the emergency room  HEENT exam is unremarkable.  Neck exam reveals no thyromegaly, carotid bruits, or JVD  Chest his to auscultation and percussion  Cardiac exam is normal without evidence of murmur, gallop, rub, or click.  Abdomen is soft. Bowel sounds are normal. No bruits are heard.  Femoral pulses are 2+ bilaterally. Right dorsalis pedis is palpable left posterior tibial is palpable. No edema is noted.  The neurological exam is unremarkable. Labs:   Lab Results  Component Value Date   WBC 4.1 03/02/2012   HGB 12.4* 03/02/2012   HCT 36.6* 03/02/2012   MCV 89.7 03/02/2012   PLT 222 03/02/2012    Lab 03/02/12 1230  NA 142  K 4.0  CL 107  CO2 26  BUN 13  CREATININE 0.96  CALCIUM 8.7  PROT --  BILITOT --  ALKPHOS --  ALT --  AST --  GLUCOSE 108*   Lab Results  Component Value Date   TROPONINI 2.07* 03/02/2012     Radiology:   CHEST - 2 VIEW  Comparison: 08/30/2011  Findings: Normal heart size. No pleural effusion or edema. No  airspace consolidation identified. Visualized osseous structures  are unremarkable.  IMPRESSION:  1. No acute cardiopulmonary abnormalities.  Original Report   EKG:  Normal  ASSESSMENT:  1. Prolonged chest pain, now resolved, the elevated troponin noted below would classify this as an acute coronary syndrome. It is odd that the patient's EKG is entirely normal.  2. Elevated troponin I.  3. Non-Hodgkin's lymphoma status post chemotherapy   Plan:  1. We will bring the patient into the hospital for clinical observation and to rule out myocardial infarction  2. Lovenox/or IV heparin  3. Cycle cardiac markers and repeat EKG  4. If the second cardiac markers elevated the patient will need to have coronary angiography. If his laboratory tests happens to be normal we will probably do a stress test.  5. Aspirin  Lesleigh Noe 03/02/2012 3:18 PM

## 2012-03-02 NOTE — Progress Notes (Signed)
Pt had nausea associated with vomitting 40ml yellowish-orange thick slimy emesis. Refused zofran. Stated he does not need any medication for it.

## 2012-03-02 NOTE — ED Notes (Signed)
Report given to [facility] 

## 2012-03-02 NOTE — Progress Notes (Signed)
ANTICOAGULATION CONSULT NOTE - Initial Consult  Pharmacy Consult for Heparin Indication: chest pain/ACS  Allergies  Allergen Reactions  . Neosporin (Neomycin-Bacitracin Zn-Polymyx) Anaphylaxis    Patient Measurements: Height: 5\' 7"  (170.2 cm) Weight: 148 lb (67.132 kg) IBW/kg (Calculated) : 66.1  Heparin Dosing Weight: 66.1kg  Vital Signs: Temp: 97.7 F (36.5 C) (12/29 1130) Temp src: Oral (12/29 1130) BP: 88/65 mmHg (12/29 1348) Pulse Rate: 81  (12/29 1348)  Labs:  Basename 03/02/12 1230  HGB 12.4*  HCT 36.6*  PLT 222  APTT 23*  LABPROT 13.2  INR 1.01  HEPARINUNFRC --  CREATININE 0.96  CKTOTAL --  CKMB --  TROPONINI 2.07*    Estimated Creatinine Clearance: 66 ml/min (by C-G formula based on Cr of 0.96).   Medical History: Past Medical History  Diagnosis Date  . Cancer   . Non Hodgkin's lymphoma     Medications:  Scheduled:    . [COMPLETED] aspirin  324 mg Oral Once  . [COMPLETED]  morphine injection  4 mg Intravenous Once   Infusions:    . nitroGLYCERIN    . [COMPLETED] sodium chloride 500 mL (03/02/12 1342)  . [DISCONTINUED] sodium chloride     PRN: nitroGLYCERIN  Assessment: 71 yo M with ACS/Stemi. Pt had left jaw pain for a couple of days and began to have chest pain 3am with pain radiating down left arm. Goal of Therapy:  Heparin 0.3-0.7     Plan:  Give 4000 units bolus x 1 Heparin 800 units/hour Heparin level in 6 hours. Heparin level daily in AM with CBC  Derrick Ravel, Joline Maxcy 03/02/2012,1:54 PM

## 2012-03-02 NOTE — ED Notes (Signed)
Pt BP 87/61, pt pain still 4/10. EDP Bernette Mayers made aware of both. Vebal order bolus and to hold off on Nitro IV until BP up.

## 2012-03-02 NOTE — ED Notes (Signed)
Pt states that his left jaw been aching for couple of days, pt states that he thought it was related to dental or cold/allergies.  This morning around 3am pt states that he started having chest pain that has been constant and radiates down left arm, pt states it not pressure or sharp, "just pain", pt cant really describe how the pain feels.  pt denies Shob, Dizzy, sweating, or n/v.

## 2012-03-03 ENCOUNTER — Encounter (HOSPITAL_COMMUNITY)
Admission: EM | Disposition: A | Discharge status: Home / Self Care | Payer: Self-pay | Source: Home / Self Care | Attending: Interventional Cardiology

## 2012-03-03 HISTORY — PX: LEFT HEART CATHETERIZATION WITH CORONARY ANGIOGRAM: SHX5451

## 2012-03-03 LAB — CBC WITH DIFFERENTIAL/PLATELET
Basophils Absolute: 0 10*3/uL (ref 0.0–0.1)
HCT: 35.3 % — ABNORMAL LOW (ref 39.0–52.0)
Hemoglobin: 11.8 g/dL — ABNORMAL LOW (ref 13.0–17.0)
Lymphocytes Relative: 26 % (ref 12–46)
Monocytes Absolute: 0.7 10*3/uL (ref 0.1–1.0)
Monocytes Relative: 14 % — ABNORMAL HIGH (ref 3–12)
Neutro Abs: 2.8 10*3/uL (ref 1.7–7.7)
Neutrophils Relative %: 59 % (ref 43–77)
WBC: 4.8 10*3/uL (ref 4.0–10.5)

## 2012-03-03 LAB — COMPREHENSIVE METABOLIC PANEL
AST: 97 U/L — ABNORMAL HIGH (ref 0–37)
Alkaline Phosphatase: 86 U/L (ref 39–117)
BUN: 10 mg/dL (ref 6–23)
CO2: 23 mEq/L (ref 19–32)
Chloride: 106 mEq/L (ref 96–112)
Creatinine, Ser: 0.84 mg/dL (ref 0.50–1.35)
GFR calc non Af Amer: 86 mL/min — ABNORMAL LOW (ref >90)
Potassium: 3.6 mEq/L (ref 3.5–5.1)
Total Bilirubin: 0.2 mg/dL — ABNORMAL LOW (ref 0.3–1.2)

## 2012-03-03 LAB — TROPONIN I: Troponin I: 10.57 ng/mL (ref <0.30)

## 2012-03-03 LAB — HEPARIN LEVEL (UNFRACTIONATED): Heparin Unfractionated: 0.24 IU/mL — ABNORMAL LOW (ref 0.30–0.70)

## 2012-03-03 LAB — TSH: TSH: 0.558 u[IU]/mL (ref 0.350–4.500)

## 2012-03-03 SURGERY — LEFT HEART CATHETERIZATION WITH CORONARY ANGIOGRAM
Anesthesia: LOCAL

## 2012-03-03 MED ORDER — ATORVASTATIN CALCIUM 80 MG PO TABS
80.0000 mg | ORAL_TABLET | Freq: Every day | ORAL | Status: DC
  Administered 2012-03-03 – 2012-03-05 (×2): 80 mg via ORAL
  Filled 2012-03-03 (×4): qty 1

## 2012-03-03 MED ORDER — HEPARIN (PORCINE) IN NACL 100-0.45 UNIT/ML-% IJ SOLN
950.0000 [IU]/h | INTRAMUSCULAR | Status: DC
  Administered 2012-03-03: 950 [IU]/h via INTRAVENOUS
  Filled 2012-03-03 (×2): qty 250

## 2012-03-03 MED ORDER — SODIUM CHLORIDE 0.9 % IV SOLN: 250.0000 mL | INTRAVENOUS | Status: DC | PRN

## 2012-03-03 MED ORDER — SODIUM CHLORIDE 0.9 % IV SOLN: INTRAVENOUS | Status: DC

## 2012-03-03 MED ORDER — SODIUM CHLORIDE 0.9 % IV SOLN: 1.0000 mL/kg/h | INTRAVENOUS | Status: DC

## 2012-03-03 MED ORDER — FENTANYL CITRATE 0.05 MG/ML IJ SOLN
INTRAMUSCULAR | Status: AC
  Filled 2012-03-03: qty 2

## 2012-03-03 MED ORDER — ATORVASTATIN CALCIUM 80 MG PO TABS
80.0000 mg | ORAL_TABLET | Freq: Every day | ORAL | Status: DC
  Filled 2012-03-03 (×2): qty 1

## 2012-03-03 MED ORDER — ACETAMINOPHEN 325 MG PO TABS: 650.0000 mg | ORAL_TABLET | ORAL | Status: DC | PRN

## 2012-03-03 MED ORDER — LIDOCAINE HCL (PF) 1 % IJ SOLN
INTRAMUSCULAR | Status: AC
  Filled 2012-03-03: qty 30

## 2012-03-03 MED ORDER — CLOPIDOGREL BISULFATE 300 MG PO TABS
600.0000 mg | ORAL_TABLET | Freq: Once | ORAL | Status: AC
  Administered 2012-03-04: 600 mg via ORAL
  Filled 2012-03-03 (×2): qty 2

## 2012-03-03 MED ORDER — DIAZEPAM 2 MG PO TABS
2.0000 mg | ORAL_TABLET | ORAL | Status: AC
  Administered 2012-03-04: 2 mg via ORAL
  Filled 2012-03-03: qty 1

## 2012-03-03 MED ORDER — ASPIRIN 81 MG PO CHEW
324.0000 mg | CHEWABLE_TABLET | ORAL | Status: AC
  Administered 2012-03-04: 324 mg via ORAL
  Filled 2012-03-03: qty 4

## 2012-03-03 MED ORDER — HEPARIN (PORCINE) IN NACL 2-0.9 UNIT/ML-% IJ SOLN
INTRAMUSCULAR | Status: AC
  Filled 2012-03-03: qty 1000

## 2012-03-03 MED ORDER — HEPARIN SODIUM (PORCINE) 1000 UNIT/ML IJ SOLN
INTRAMUSCULAR | Status: AC
  Filled 2012-03-03: qty 1

## 2012-03-03 MED ORDER — ONDANSETRON HCL 4 MG/2ML IJ SOLN: 4.0000 mg | Freq: Four times a day (QID) | INTRAMUSCULAR | Status: DC | PRN

## 2012-03-03 MED ORDER — MIDAZOLAM HCL 2 MG/2ML IJ SOLN
INTRAMUSCULAR | Status: AC
  Filled 2012-03-03: qty 2

## 2012-03-03 MED ORDER — ASPIRIN 81 MG PO CHEW: 324.0000 mg | CHEWABLE_TABLET | ORAL | Status: DC

## 2012-03-03 MED ORDER — HEPARIN (PORCINE) IN NACL 100-0.45 UNIT/ML-% IJ SOLN: 950.0000 [IU]/h | INTRAMUSCULAR | Status: DC

## 2012-03-03 MED ORDER — VERAPAMIL HCL 2.5 MG/ML IV SOLN
INTRAVENOUS | Status: AC
  Filled 2012-03-03: qty 2

## 2012-03-03 MED ORDER — SODIUM CHLORIDE 0.9 % IV SOLN
1.0000 mL/kg/h | INTRAVENOUS | Status: AC
  Administered 2012-03-03: 1 mL/kg/h via INTRAVENOUS

## 2012-03-03 MED ORDER — SODIUM CHLORIDE 0.9 % IJ SOLN: 3.0000 mL | Freq: Two times a day (BID) | INTRAMUSCULAR | Status: DC

## 2012-03-03 MED ORDER — NITROGLYCERIN 0.2 MG/ML ON CALL CATH LAB
INTRAVENOUS | Status: AC
  Filled 2012-03-03: qty 1

## 2012-03-03 MED ORDER — SODIUM CHLORIDE 0.9 % IJ SOLN: 3.0000 mL | INTRAMUSCULAR | Status: DC | PRN

## 2012-03-03 MED ORDER — HEPARIN BOLUS VIA INFUSION
1000.0000 [IU] | Freq: Once | INTRAVENOUS | Status: AC
  Administered 2012-03-03: 1000 [IU] via INTRAVENOUS
  Filled 2012-03-03: qty 1000

## 2012-03-03 MED ORDER — HEPARIN (PORCINE) IN NACL 100-0.45 UNIT/ML-% IJ SOLN
950.0000 [IU]/h | INTRAMUSCULAR | Status: DC
  Filled 2012-03-03: qty 250

## 2012-03-03 NOTE — Progress Notes (Signed)
ANTICOAGULATION CONSULT NOTE - Follow Up Consult  Pharmacy Consult for heparin Indication: chest pain/ACS  Allergies  Allergen Reactions  . Neosporin (Neomycin-Bacitracin Zn-Polymyx) Anaphylaxis    Patient Measurements: Height: 5\' 7"  (170.2 cm) Weight: 148 lb 2.4 oz (67.2 kg) IBW/kg (Calculated) : 66.1  Heparin Dosing Weight: 67 kg  Vital Signs: Temp: 98.9 F (37.2 C) (12/30 0000) Temp src: Oral (12/29 2100) BP: 98/64 mmHg (12/30 0100) Pulse Rate: 55  (12/30 0100)  Labs:  Basename 03/03/12 0248 03/03/12 0030 03/02/12 1230  HGB -- 11.8* 12.4*  HCT -- 35.3* 36.6*  PLT -- 191 222  APTT -- -- 23*  LABPROT -- -- 13.2  INR -- -- 1.01  HEPARINUNFRC 0.24* -- --  CREATININE -- 0.84 0.96  CKTOTAL -- -- --  CKMB -- -- --  TROPONINI -- -- 2.07*    Estimated Creatinine Clearance: 75.4 ml/min (by C-G formula based on Cr of 0.84).   Medications:  Scheduled:    . [COMPLETED] aspirin  324 mg Oral Once  . [COMPLETED] aspirin  324 mg Oral NOW   Or  . [COMPLETED] aspirin  300 mg Rectal NOW  . aspirin EC  81 mg Oral Daily  . famotidine  20 mg Oral Daily  . ferrous sulfate  325 mg Oral Q breakfast  . [COMPLETED] heparin  4,000 Units Intravenous Once  . [COMPLETED]  morphine injection  4 mg Intravenous Once  . ondansetron  4 mg Intravenous Once  . [COMPLETED] sodium chloride  500 mL Intravenous Once  . [COMPLETED] sodium chloride  500 mL Intravenous Once  . cyanocobalamin  100 mcg Oral Daily  . [DISCONTINUED] heparin  4,000 Units Intravenous Once  . [DISCONTINUED] sodium chloride  500 mL Intravenous Once    Assessment: 71 yo male on IV heparin for chest pain / ACS. Heparin level (0.24) is below-goal on 800 units/hr. No problem with line / infusion and no bleeding per RN.   Goal of Therapy:  Heparin level 0.3-0.7 units/ml Monitor platelets by anticoagulation protocol: Yes   Plan:  1. Heparin 1000 unit IV bolus x 1, then increase IV heparin to 950 units/hr.  2. Heparin  level in 8 hours.   Emeline Gins 03/03/2012,3:31 AM

## 2012-03-03 NOTE — H&P (View-Only) (Signed)
Patient Name: Eric [name] Date of Encounter: 03/03/2012    SUBJECTIVE: The patient had nausea and vomiting last night. He has had no recurrence of chest pain. Relatively low blood pressures during the night. No arrhythmias noted.  TELEMETRY:  Normal sinus rhythm: Filed Vitals:   03/03/12 0300 03/03/12 0400 03/03/12 0500 03/03/12 0600  BP: 105/65 102/57 98/63   Pulse: 58 58 55 57  Temp:  98.2 F (36.8 C)    TempSrc:  Oral    Resp: 17 16 16 16   Height:      Weight:      SpO2: 96% 97% 94% 96%    Intake/Output Summary (Last 24 hours) at 03/03/12 0800 Last data filed at 03/03/12 0600  Gross per 24 hour  Intake    321 ml  Output    420 ml  Net    -99 ml    LABS: Basic Metabolic Panel:  Basename 03/03/12 0030 03/02/12 1230  NA 139 142  K 3.6 4.0  CL 106 107  CO2 23 26  GLUCOSE 133* 108*  BUN 10 13  CREATININE 0.84 0.96  CALCIUM 8.6 8.7  MG 1.9 --  PHOS -- --   CBC:  Basename 03/03/12 0030 03/02/12 1230  WBC 4.8 4.1  NEUTROABS 2.8 2.6  HGB 11.8* 12.4*  HCT 35.3* 36.6*  MCV 90.5 89.7  PLT 191 222   Cardiac Enzymes:  Basename 03/03/12 0248 03/02/12 1230  CKTOTAL -- --  CKMB -- --  CKMBINDEX -- --  TROPONINI 7.77* 2.07*   Radiology/Studies:  No new data   ECG: The patient has now developed inferior T-wave inversion with slightly deeper Q waves in the same leads compatible with an infarction pattern  Physical Exam: Blood pressure 98/63, pulse 57, temperature 98.2 F (36.8 C), temperature source Oral, resp. rate 16, height 5\' 7"  (1.702 m), weight 67.2 kg (148 lb 2.4 oz), SpO2 96.00%. Weight change:    Lungs are clear auscultation and percussion  Cardiac exam reveals no murmur, rub, click, or gallop.  Femoral pulses and radial pulses are 2+ and symmetric. There is no edema.  ASSESSMENT:  1. Non-ST elevation myocardial infarction, inferior wall. Suspect occluded right coronary or circumflex with collaterals.  2. Non-Hodgkin's  lymphoma  Plan:  1. Coronary angiography to define anatomy has been recommended to the patient. This will be performed by Dr. Anne Fu. The procedure and risks including stroke, death, myocardial infarction, bleeding, allergy, kidney injury, limb ischemia, among others were discussed in detail except about the patient in the presence of his wife. Depending upon anatomy the patient may need revascularization with either PCI or CABG. If total occlusion with good collaterals may simply require medical therapy.  2. N.p.o. until cath  SignedLesleigh Noe 03/03/2012, 8:00 AM

## 2012-03-03 NOTE — CV Procedure (Signed)
CARDIAC CATHETERIZATION  PROCEDURE:  Left heart catheterization with selective coronary angiography, left ventriculogram via the radial artery approach.  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 Prior pain developed at 3 AM while watching a movie, left-sided radiated to his left arm and into his jaw. Lasted approximately one hour.  The risks, benefits, and details of the procedure were explained to the patient, including possibilities of stroke, heart attack, death, renal impairment, arterial damage, bleeding.  The patient verbalized understanding and wanted to proceed.  Informed written consent was obtained.  PROCEDURE TECHNIQUE:  Allen's test was performed pre-and post procedure and was normal. The right radial artery site was prepped and draped in a sterile fashion. One percent lidocaine was used for local anesthesia. Using the modified Seldinger technique a 5 French hydrophilic sheath was inserted into the radial artery without difficulty. 3 mg of verapamil was administered via the sheath. A Judkins right #4 catheter with the guidance of a Versicore wire was placed in the right coronary cusp and selectively cannulated the right coronary artery. After traversing the aortic arch, 3500 units of heparin IV was administered. A Judkins left #3.5 catheter was used to selectively cannulate the left main artery. Multiple views with hand injection of Omnipaque were obtained. Catheter a pigtail catheter was used to cross into the left ventricle, hemodynamics were obtained, and a left ventriculogram was performed in the RAO position with power injection. Following the procedure, sheath was removed, patient was hemodynamically stable, hemostasis was maintained with a Terumo T band.   CONTRAST:  Total of 80 ml.    FLOUROSCOPY TIME: 3.0 min.  COMPLICATIONS:  None.     HEMODYNAMICS:  Aortic pressure was 97/21mmHg; LV systolic pressure was ; LVEDP .  There was no gradient between the left ventricle and aorta.    ANGIOGRAPHIC DATA:    Left main: Branches into LAD, ramus, and circumflex artery. Distal left main tapers and is heavily calcified; this is best seen in the  LAO caudal projection.   Left anterior descending (LAD):  this proximal vessel is heavily calcified. At the branch point of a large first diagonal there is eccentric plaque that in steep LAO caudal projection appears to be hemodynamically significant. In other views however does not appear to be flow-limiting. The large diagonal branch however in its ostial/proximal portion does appear to be hemodynamically significant with stenosis of up to 95%.  The mid to distal LAD demonstrates 50% stenosis.   Circumflex artery (CIRC):  there are 3 significant obtuse marginal branches (ramus included). In the proximal section of the AV groove circumflex there appears to be a nub where previous branch may have been present. Minor luminal irregularities throughout this vessel.   Right coronary artery (RCA): this vessel is occluded proximally with thrombus in place, minimal flow distal with left to right collateral blood flow predominately from septal branch of LAD.   LEFT VENTRICULOGRAM:  Left ventricular angiogram was done in the 30 RAO projection and revealed base to mid inferior wall akinesis with an estimated ejection fraction of  45-50%.   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 95% first diagonal branch stenosis. Possibly hemodynamically significant LAD, proximal stenosis at the bifurcation of this first diagonal branch, eccentric calcified plaque. Calcified, tapered left main distally.   Mildly reduced left ventricular systolic function.  LVEDP 15 mmHg.  Ejection fraction 45-50%.  RECOMMENDATION:  Findings have  been discussed with patient, Dr.  Katrinka Blazing. Dr. Katrinka Blazing will review films and determine possible further evaluation of LAD, possible FFR. We will resume heparin in 8 hours. Radial sheath pulled. Patient was symptom-free.

## 2012-03-03 NOTE — Care Management Note (Addendum)
    Page 1 of 1   03/06/2012     2:48:34 PM   CARE MANAGEMENT NOTE 03/06/2012  Patient:  Eric [name], Eric [name]   Account Number:  [id number]  Date Initiated:  03/03/2012  Documentation initiated by:  Junius Creamer  Subjective/Objective Assessment:   adm w mi     Action/Plan:   lives w wife, pcp dr Thayer Ohm guest   Anticipated DC Date:  03/06/2012   Anticipated DC Plan:  HOME/SELF CARE      DC Planning Services  CM consult      Choice offered to / List presented to:             Status of service:  Completed, signed off Medicare Important Message given?   (If response is "NO", the following Medicare IM given date fields will be blank) Date Medicare IM given:   Date Additional Medicare IM given:    Discharge Disposition:  HOME/SELF CARE  Per UR Regulation:  Reviewed for med. necessity/level of care/duration of stay  If discussed at Long Length of Stay Meetings, dates discussed:    Comments:  03/06/12 Eric Paiva,RN,BSN [phone] PT GIVEN EFFIENT [employer]AY PAY CARD PRIOR TO DC.  12/30 1143a debbie dowell rn,bsn [phone]

## 2012-03-03 NOTE — Progress Notes (Signed)
I reviewed the coronary angiogram performed by Dr. Anne Fu. The patient has a 99% obstruction in the proximal to mid right coronary with TIMI grade 1 flow. The right coronary and left coronary system is heavily calcified. There is collateralization of the distal right coronary territory from the left coronary. The proximal to mid LAD contains a 50% stenosis and the second diagonal contains a proximal 70-80% stenosis. After reviewing the images, I believe that there is significant inferolateral myocardium that is still viable as there is only moderate inferior wall hypokinesis. I do not believe that the inferior infarction has completed itself. The patient had some intermittent chest pain even this evening during our conversation.  I have recommended that the patient undergo repeat catheterization with the goal being to recanalize the right coronary artery and placed a drug-eluting stent. I do not believe he has surgical anatomy. We may consider performing FFR on the LAD, however, the patient has had no significant exertional symptoms prior to this admission making it unlikely that he is having angina related to the LAD territory. RCA occlusion appears to be an acute thrombotic event on top of fixed disease. Thrombus is noted in the region of occlusion.  I spoke with the patient concerning this proposed plan including the risks of bleeding, stroke, death, myocardial infarction, emergency surgery, stroke, among other complications . The patient is willing to proceed with the planned PCI of the right coronary artery.

## 2012-03-03 NOTE — Interval H&P Note (Signed)
History and Physical Interval Note:  03/03/2012 1:51 PM  Eric [name]  has presented today for surgery, with the diagnosis of Chest pain  The various methods of treatment have been discussed with the patient and family. After consideration of risks, benefits and other options for treatment, the patient has consented to  Procedure(s) (LRB) with comments: LEFT HEART CATHETERIZATION WITH CORONARY ANGIOGRAM (N/A) as a surgical intervention .  The patient's history has been reviewed, patient examined, no change in status, stable for surgery.  I have reviewed the patient's chart and labs.  Questions were answered to the patient's satisfaction.     SKAINS, MARK  Discussed risks with him and his wife and daughter. Risk of stroke, MI, death, bleeding. Willing to proceed.

## 2012-03-03 NOTE — Progress Notes (Signed)
Patient Name: Eric [name] Date of Encounter: 03/03/2012    SUBJECTIVE: The patient had nausea and vomiting last night. He has had no recurrence of chest pain. Relatively low blood pressures during the night. No arrhythmias noted.  TELEMETRY:  Normal sinus rhythm: Filed Vitals:   03/03/12 0300 03/03/12 0400 03/03/12 0500 03/03/12 0600  BP: 105/65 102/57 98/63   Pulse: 58 58 55 57  Temp:  98.2 F (36.8 C)    TempSrc:  Oral    Resp: 17 16 16 16  Height:      Weight:      SpO2: 96% 97% 94% 96%    Intake/Output Summary (Last 24 hours) at 03/03/12 0800 Last data filed at 03/03/12 0600  Gross per 24 hour  Intake    321 ml  Output    420 ml  Net    -99 ml    LABS: Basic Metabolic Panel:  Basename 03/03/12 0030 03/02/12 1230  NA 139 142  K 3.6 4.0  CL 106 107  CO2 23 26  GLUCOSE 133* 108*  BUN 10 13  CREATININE 0.84 0.96  CALCIUM 8.6 8.7  MG 1.9 --  PHOS -- --   CBC:  Basename 03/03/12 0030 03/02/12 1230  WBC 4.8 4.1  NEUTROABS 2.8 2.6  HGB 11.8* 12.4*  HCT 35.3* 36.6*  MCV 90.5 89.7  PLT 191 222   Cardiac Enzymes:  Basename 03/03/12 0248 03/02/12 1230  CKTOTAL -- --  CKMB -- --  CKMBINDEX -- --  TROPONINI 7.77* 2.07*   Radiology/Studies:  No new data   ECG: The patient has now developed inferior T-wave inversion with slightly deeper Q waves in the same leads compatible with an infarction pattern  Physical Exam: Blood pressure 98/63, pulse 57, temperature 98.2 F (36.8 C), temperature source Oral, resp. rate 16, height 5' 7" (1.702 m), weight 67.2 kg (148 lb 2.4 oz), SpO2 96.00%. Weight change:    Lungs are clear auscultation and percussion  Cardiac exam reveals no murmur, rub, click, or gallop.  Femoral pulses and radial pulses are 2+ and symmetric. There is no edema.  ASSESSMENT:  1. Non-ST elevation myocardial infarction, inferior wall. Suspect occluded right coronary or circumflex with collaterals.  2. Non-Hodgkin's  lymphoma  Plan:  1. Coronary angiography to define anatomy has been recommended to the patient. This will be performed by Dr. Skains. The procedure and risks including stroke, death, myocardial infarction, bleeding, allergy, kidney injury, limb ischemia, among others were discussed in detail except about the patient in the presence of his wife. Depending upon anatomy the patient may need revascularization with either PCI or CABG. If total occlusion with good collaterals may simply require medical therapy.  2. N.p.o. until cath  Signed, Harmonee Tozer III,Megon Kalina W 03/03/2012, 8:00 AM  

## 2012-03-03 NOTE — Progress Notes (Addendum)
ANTICOAGULATION CONSULT NOTE - Follow Up Consult  Pharmacy Consult for heparin Indication: chest pain/ACS  Allergies  Allergen Reactions  . Neosporin (Neomycin-Bacitracin Zn-Polymyx) Anaphylaxis   Patient Measurements: Height: 5\' 7"  (170.2 cm) Weight: 154 lb 11.2 oz (70.171 kg) IBW/kg (Calculated) : 66.1  Heparin Dosing Weight: 67 kg  Vital Signs: Temp: 98.1 F (36.7 C) (12/30 0811) Temp src: Oral (12/30 0811) BP: 118/64 mmHg (12/30 1015) Pulse Rate: 61  (12/30 1015)  Labs:  Basename 03/03/12 0945 03/03/12 0248 03/03/12 0030 03/02/12 1230  HGB -- -- 11.8* 12.4*  HCT -- -- 35.3* 36.6*  PLT -- -- 191 222  APTT -- -- -- 23*  LABPROT -- -- -- 13.2  INR -- -- -- 1.01  HEPARINUNFRC -- 0.24* -- --  CREATININE -- -- 0.84 0.96  CKTOTAL -- -- -- --  CKMB -- -- -- --  TROPONINI 10.57* 7.77* -- 2.07*   Estimated Creatinine Clearance: 75.4 ml/min (by C-G formula based on Cr of 0.84).  Medications:  Scheduled:   . aspirin EC  81 mg Oral Daily  . atorvastatin  80 mg Oral q1800  . famotidine  20 mg Oral Daily  . ferrous sulfate  325 mg Oral Q breakfast  . ondansetron  4 mg Intravenous Once  . cyanocobalamin  100 mcg Oral Daily   Assessment: 71 yo male on IV heparin for chest pain / ACS. Patient had cardiac cath today which revealed some occlusion and stenosis.  Plan is to restart IV heparin 8 hours after procedure until decision regarding LAD.  He underwent a radial artery approach.  He was slightly below goal on IV rate of 800 units/hr.   EF documented via cath as 45-50%.  Goal of Therapy:  Heparin level 0.3-0.7 units/ml Monitor platelets by anticoagulation protocol: Yes   Plan:  1. Will restart IV Heparin at 950 units/hr eight hours after procedure.    2. Heparin level in 8 hours.   Nadara Mustard, PharmD., MS Clinical Pharmacist Pager:  [phone] Thank you for allowing pharmacy to be part of this patients care team. 03/03/2012,3:15 PM

## 2012-03-04 ENCOUNTER — Encounter (HOSPITAL_COMMUNITY)
Admission: EM | Disposition: A | Discharge status: Home / Self Care | Payer: Self-pay | Source: Home / Self Care | Attending: Interventional Cardiology

## 2012-03-04 HISTORY — PX: PERCUTANEOUS CORONARY STENT INTERVENTION (PCI-S): SHX5485

## 2012-03-04 LAB — BASIC METABOLIC PANEL
CO2: 25 mEq/L (ref 19–32)
Chloride: 108 mEq/L (ref 96–112)
Creatinine, Ser: 0.86 mg/dL (ref 0.50–1.35)
GFR calc Af Amer: >90 mL/min (ref >90)
Potassium: 3.5 mEq/L (ref 3.5–5.1)

## 2012-03-04 LAB — PLATELET INHIBITION P2Y12: Platelet Function  P2Y12: 326 [PRU] (ref 194–418)

## 2012-03-04 LAB — CBC
MCV: 89.7 fL (ref 78.0–100.0)
Platelets: 170 10*3/uL (ref 150–400)
RDW: 13.6 % (ref 11.5–15.5)
WBC: 4.2 10*3/uL (ref 4.0–10.5)

## 2012-03-04 LAB — HEPARIN LEVEL (UNFRACTIONATED): Heparin Unfractionated: 0.32 IU/mL (ref 0.30–0.70)

## 2012-03-04 SURGERY — PERCUTANEOUS CORONARY STENT INTERVENTION (PCI-S)
Anesthesia: LOCAL

## 2012-03-04 MED ORDER — HEPARIN (PORCINE) IN NACL 2-0.9 UNIT/ML-% IJ SOLN
INTRAMUSCULAR | Status: AC
  Filled 2012-03-04: qty 1000

## 2012-03-04 MED ORDER — ACETAMINOPHEN 325 MG PO TABS: 650.0000 mg | ORAL_TABLET | ORAL | Status: DC | PRN

## 2012-03-04 MED ORDER — NITROGLYCERIN 0.2 MG/ML ON CALL CATH LAB
INTRAVENOUS | Status: AC
  Filled 2012-03-04: qty 1

## 2012-03-04 MED ORDER — ASPIRIN EC 325 MG PO TBEC
325.0000 mg | DELAYED_RELEASE_TABLET | Freq: Every day | ORAL | Status: DC
  Administered 2012-03-05: 325 mg via ORAL
  Filled 2012-03-04 (×2): qty 1

## 2012-03-04 MED ORDER — CLOPIDOGREL BISULFATE 75 MG PO TABS
75.0000 mg | ORAL_TABLET | Freq: Every day | ORAL | Status: DC
  Administered 2012-03-05: 75 mg via ORAL
  Filled 2012-03-04: qty 1

## 2012-03-04 MED ORDER — DOPAMINE-DEXTROSE 3.2-5 MG/ML-% IV SOLN
INTRAVENOUS | Status: AC
  Filled 2012-03-04: qty 250

## 2012-03-04 MED ORDER — SODIUM CHLORIDE 0.9 % IV SOLN: 0.2500 mg/kg/h | INTRAVENOUS | Status: DC

## 2012-03-04 MED ORDER — NITROGLYCERIN IN D5W 200-5 MCG/ML-% IV SOLN
5.0000 ug/min | INTRAVENOUS | Status: DC
  Administered 2012-03-04: 5 ug/min via INTRAVENOUS
  Filled 2012-03-04: qty 250

## 2012-03-04 MED ORDER — MIDAZOLAM HCL 2 MG/2ML IJ SOLN
INTRAMUSCULAR | Status: AC
  Filled 2012-03-04: qty 2

## 2012-03-04 MED ORDER — ONDANSETRON HCL 4 MG/2ML IJ SOLN: 4.0000 mg | Freq: Four times a day (QID) | INTRAMUSCULAR | Status: DC | PRN

## 2012-03-04 MED ORDER — SODIUM CHLORIDE 0.9 % IV SOLN
0.2500 mg/kg/h | INTRAVENOUS | Status: DC
  Filled 2012-03-04 (×3): qty 250

## 2012-03-04 MED ORDER — SODIUM CHLORIDE 0.9 % IV SOLN: INTRAVENOUS | Status: DC

## 2012-03-04 MED ORDER — BIVALIRUDIN 250 MG IV SOLR
INTRAVENOUS | Status: AC
  Filled 2012-03-04: qty 250

## 2012-03-04 MED ORDER — DOPAMINE-DEXTROSE 3.2-5 MG/ML-% IV SOLN: 2.5000 ug/kg/min | INTRAVENOUS | Status: AC

## 2012-03-04 MED ORDER — OXYCODONE-ACETAMINOPHEN 5-325 MG PO TABS: 1.0000 | ORAL_TABLET | ORAL | Status: DC | PRN

## 2012-03-04 MED ORDER — LIDOCAINE HCL (PF) 1 % IJ SOLN
INTRAMUSCULAR | Status: AC
  Filled 2012-03-04: qty 30

## 2012-03-04 MED ORDER — FENTANYL CITRATE 0.05 MG/ML IJ SOLN
INTRAMUSCULAR | Status: AC
  Filled 2012-03-04: qty 2

## 2012-03-04 MED ORDER — MORPHINE SULFATE 2 MG/ML IJ SOLN: 2.0000 mg | INTRAMUSCULAR | Status: DC | PRN

## 2012-03-04 NOTE — Progress Notes (Signed)
ANTICOAGULATION CONSULT NOTE - Follow Up Consult  Pharmacy Consult for heparin Indication: chest pain/ACS  Allergies  Allergen Reactions  . Neosporin (Neomycin-Bacitracin Zn-Polymyx) Anaphylaxis   Patient Measurements: Height: 5\' 7"  (170.2 cm) Weight: 157 lb 8 oz (71.442 kg) IBW/kg (Calculated) : 66.1  Heparin Dosing Weight: 67 kg  Vital Signs: Temp: 98.7 F (37.1 C) (12/31 0749) Temp src: Oral (12/31 0749) BP: 113/64 mmHg (12/31 0900) Pulse Rate: 59  (12/31 0900)  Labs:  Basename 03/04/12 0655 03/03/12 0945 03/03/12 0248 03/03/12 0030 03/02/12 1230  HGB 11.1* -- -- 11.8* --  HCT 32.3* -- -- 35.3* 36.6*  PLT 170 -- -- 191 222  APTT -- -- -- -- 23*  LABPROT -- -- -- -- 13.2  INR -- -- -- -- 1.01  HEPARINUNFRC 0.32 -- 0.24* -- --  CREATININE 0.86 -- -- 0.84 0.96  CKTOTAL -- -- -- -- --  CKMB -- -- -- -- --  TROPONINI -- 10.57* 7.77* -- 2.07*   Estimated Creatinine Clearance: 73.7 ml/min (by C-G formula based on Cr of 0.86).  Medications:  Scheduled:   . aspirin EC  81 mg Oral Daily  . atorvastatin  80 mg Oral q1800  . famotidine  20 mg Oral Daily  . ferrous sulfate  325 mg Oral Q breakfast  . ondansetron  4 mg Intravenous Once  . cyanocobalamin  100 mcg Oral Daily   Assessment: 71 yo male on IV heparin for chest pain / ACS. Patient had cardiac cath today which revealed some occlusion and stenosis.  Heparin level is therapeutic at 0.32 IU/ml.  No noted bleeding complications and patient with ongoing chest pain.  Plan is to return to cath lab today for PCI to RCA. Also noted is thrombus at the region of occlusion.  Will f/u after cath for plans to restart heparin in light of findings.  Goal of Therapy:  Heparin level 0.3-0.7 units/ml Monitor platelets by anticoagulation protocol: Yes   Plan:  1. Continue IV Heparin at 950 units/hr.     2. F/U after cath.   Nadara Mustard, PharmD., MS Clinical Pharmacist Pager:  [phone] Thank you for allowing pharmacy to  be part of this patients care team. 03/04/2012,10:47 AM

## 2012-03-04 NOTE — H&P (Signed)
Stable since early this morning since IV nitroglycerin started. No change in exam or clinical status since that time.

## 2012-03-04 NOTE — CV Procedure (Signed)
   PERCUTANEOUS CORONARY INTERVENTION   Eric [name] is a 71 y.o. male  INDICATION: Recent inferior wall non-ST elevation myocardial infarction. Subtotally occluded proximal to mid right coronary with an adequate inferior wall collaterals have resulted in waxing and waning pain over the past 48 hours. Occurring this morning was relieved with initiation of an IV nitroglycerin drip.   PROCEDURE: Plasty and stenting of the 99% proximal to mid RCA   CONSENT: The risks, benefits, and details of the procedure were explained to the patient. Risks including death, MI, stroke, bleeding, limb ischemia, renal failure and allergy were described and accepted by the patient.  Informed written consent was obtained prior to proceeding.  PROCEDURE TECHNIQUE:  After Xylocaine anesthesia a 6 French sheath was placed in the right femoral artery with a single anterior needle wall stick.   Coronary guiding shots were made using a 6 [place] JR 4 guide catheter. Antithrombotic therapy, Bivalirudin bolus and infusion, was begun and determined to be therapeutic by ACT. Antiplatelet therapy, 600 mg, was loaded 18 hours ago.  A poor wire was advanced through the stenosis in the right coronary and predilatation was performed with a 2.0 x 15 mm balloon we then made several passes with thrombectomy catheter. After visualizing the artery following predilatation and aspiration we felt that the entire mid segment needed to be treated. We will unable to pass a 38 mm long Promus Premier into proper position. We then removed this catheter and did more aggressive dilatation using a 3.0 x 20 mm in the Empira balloon throughout the treatment segment. Then attempted to position the 38 mm long stent but again it would not travel down the vessel. We then placed a Prowater buddy wire despite having the buddy wire in place the stent would not track. Aggressive guide positioning was employed. This then caused a proximal vessel dissection.  After repositioning the guide catheter, and performing additional dilatation distally we will able to advance and deployed a 38 x 3.0 mm Promus Premier drug-eluting stent. Deployed at 12 atmospheres. Because of the proximal dissection we then positioned and deployed a 3.0 x 28 mm long Promus premier from the midsegment overlapping with the prior stent to near the ostium of the right coronary. The stent was deployed to 16 atmospheres post dilatation was then performed with a 20 mm long by 3.25 mm Empira Francisco balloon to 16 atmospheres throughout the treated segment. TIMI grade 3 flow was noted. No evidence of perforation was noted. The dissection was completely covered. Patient experienced no complications. He did have left blood pressure throughout the procedure he eventually requiring a dopamine infusion.  CONTRAST:  Total of 200  cc.  COMPLICATIONS:  Proximal catheter induced dissection.    ANGIOGRAPHIC RESULTS:   The 99% segmental proximal to mid right coronary stenosis was reduced to 0% with TIMI grade 3 flow using overlapping 38 and 28 mm x 3.0 mm diameter Promus Premier drug-eluting stents.   IMPRESSIONS:  1. Successful but complicated PCI with reduction in the near total occlusion of the right coronary to 0% with TIMI grade 3 flow following overlap stenting of the distal to proximal right coronary using a total of 66 mm of drug-eluting stent.   RECOMMENDATION:  Prolonged dual antiplatelet therapy for greater than 12 months.Marland Kitchen    Lesleigh Noe, MD 03/04/2012 2:56 PM

## 2012-03-04 NOTE — Progress Notes (Signed)
Overnight the patient has continued to have recurring episodes of chest pain. There is mild discomfort at this time. No acute EKG changes. Will start IV nitroglycerin. He is already on heparin and has received Plavix. Cath and PCI later this morning from the right femoral approach .

## 2012-03-04 NOTE — Progress Notes (Signed)
Dopamine gtt. Discontinued due to consistently high B/Ps above 120-130/70--80.

## 2012-03-05 DIAGNOSIS — T45525A Adverse effect of antithrombotic drugs, initial encounter: Secondary | ICD-10-CM

## 2012-03-05 LAB — BASIC METABOLIC PANEL
GFR calc Af Amer: >90 mL/min (ref >90)
GFR calc non Af Amer: 83 mL/min — ABNORMAL LOW (ref >90)
Potassium: 3.3 mEq/L — ABNORMAL LOW (ref 3.5–5.1)
Sodium: 142 mEq/L (ref 135–145)

## 2012-03-05 LAB — PLATELET INHIBITION P2Y12: Platelet Function  P2Y12: 294 [PRU] (ref 194–418)

## 2012-03-05 MED ORDER — PRASUGREL HCL 10 MG PO TABS
10.0000 mg | ORAL_TABLET | Freq: Every day | ORAL | Status: DC
  Filled 2012-03-05: qty 1

## 2012-03-05 MED ORDER — PRASUGREL HCL 10 MG PO TABS: 10.0000 mg | ORAL_TABLET | Freq: Every day | ORAL | Status: DC

## 2012-03-05 MED ORDER — PRASUGREL HCL 10 MG PO TABS
30.0000 mg | ORAL_TABLET | ORAL | Status: AC
  Administered 2012-03-05: 30 mg via ORAL
  Filled 2012-03-05: qty 3

## 2012-03-05 MED ORDER — PRASUGREL HCL 10 MG PO TABS: 20.0000 mg | ORAL_TABLET | Freq: Once | ORAL | Status: DC

## 2012-03-05 NOTE — Progress Notes (Addendum)
Patient ID: Eric [name], male   DOB: 1940-12-06, 72 y.o.   MRN: [id number] Patient Name: Martie Fulgham Date of Encounter: 03/05/2012    SUBJECTIVE: Mr. Cavell has been asymptomatic during the night. He denies chest pain and dyspnea. No right groin access site bleeding or pain.  TELEMETRY:  Normal sinus rhythm.: Filed Vitals:   03/05/12 0000 03/05/12 0400 03/05/12 0600 03/05/12 0745  BP: 111/66 104/64  116/69  Pulse: 61  78 74  Temp: 97.6 F (36.4 C) 97.9 F (36.6 C)  98.4 F (36.9 C)  TempSrc: Oral Oral  Oral  Resp: 16   18  Height:      Weight:   69.8 kg (153 lb 14.1 oz)   SpO2: 100%  89% 98%    Intake/Output Summary (Last 24 hours) at 03/05/12 1130 Last data filed at 03/05/12 1000  Gross per 24 hour  Intake 1705.79 ml  Output    475 ml  Net 1230.79 ml    LABS: Basic Metabolic Panel:  Basename 03/05/12 0545 03/04/12 0655 03/03/12 0030  NA 142 141 --  K 3.3* 3.5 --  CL 107 108 --  CO2 23 25 --  GLUCOSE 83 98 --  BUN 9 7 --  CREATININE 0.92 0.86 --  CALCIUM 8.5 8.5 --  MG -- -- 1.9  PHOS -- -- --   CBC:  Basename 03/04/12 0655 03/03/12 0030 03/02/12 1230  WBC 4.2 4.8 --  NEUTROABS -- 2.8 2.6  HGB 11.1* 11.8* --  HCT 32.3* 35.3* --  MCV 89.7 90.5 --  PLT 170 191 --   Cardiac Enzymes:  Basename 03/03/12 0945 03/03/12 0248 03/02/12 1230  CKTOTAL -- -- --  CKMB -- -- --  CKMBINDEX -- -- --  TROPONINI 10.57* 7.77* 2.07*   Radiology/Studies:  No new data  Results for OLAOLUWA, GRIEDER (MRN [id number]) as of 03/05/2012 13:14  Ref. Range 03/04/2012 06:55 03/05/2012 12:24  Platelet Function  P2Y12 Latest Range: 194-418 PRU 326 294    Physical Exam: Blood pressure 116/69, pulse 74, temperature 98.4 F (36.9 C), temperature source Oral, resp. rate 18, height 5\' 7"  (1.702 m), weight 69.8 kg (153 lb 14.1 oz), SpO2 98.00%. Weight change: -0.371 kg (-13.1 oz)   No gallop, rub, click, or murmur.  Right femoral is without bruit or hematoma.  ASSESSMENT:  1.  Inferior non-ST elevation myocardial infarction with recurring pain post catheterization. PCI with long overlapping drug-eluting stents in the right coronary had resolved symptoms.  2. Hypokalemia  3. Non-Hodgkin's lymphoma  4. Plavix hyporesponder   Plan:  1. Transferred to telemetry  2. Ambulation and teaching from phase 1 cardiac rehabilitation with plan for discharge on 03/07/2011  3. Switch to Effient  Signed, Lesleigh Noe 03/05/2012, 11:30 AM

## 2012-03-05 NOTE — Progress Notes (Signed)
Report called to [facility] on 2000 and patient being transported to 2019 via wheelchair.

## 2012-03-06 DIAGNOSIS — I214 Non-ST elevation (NSTEMI) myocardial infarction: Secondary | ICD-10-CM

## 2012-03-06 LAB — PLATELET INHIBITION P2Y12: Platelet Function  P2Y12: 115 [PRU] — ABNORMAL LOW (ref 194–418)

## 2012-03-06 MED ORDER — PRASUGREL HCL 10 MG PO TABS: 10.0000 mg | ORAL_TABLET | Freq: Every day | ORAL | Status: DC

## 2012-03-06 MED ORDER — ASPIRIN 325 MG PO TBEC: 325.0000 mg | DELAYED_RELEASE_TABLET | Freq: Every day | ORAL | Status: DC

## 2012-03-06 MED ORDER — ATORVASTATIN CALCIUM 40 MG PO TABS: 40.0000 mg | ORAL_TABLET | Freq: Every day | ORAL | Status: DC

## 2012-03-06 MED ORDER — NITROGLYCERIN 0.4 MG SL SUBL: 0.4000 mg | SUBLINGUAL_TABLET | SUBLINGUAL | Status: DC | PRN

## 2012-03-06 MED ORDER — POTASSIUM CHLORIDE CRYS ER 20 MEQ PO TBCR: 40.0000 meq | EXTENDED_RELEASE_TABLET | Freq: Once | ORAL | Status: DC

## 2012-03-06 MED FILL — Dextrose Inj 5%: INTRAVENOUS | Qty: 50 | Status: AC

## 2012-03-06 NOTE — Progress Notes (Signed)
CARDIAC REHAB PHASE I   PRE:  Rate/Rhythm: 67SR  BP:  Supine: 122/78  Sitting:   Standing:    SaO2:   MODE:  Ambulation: 550 ft   POST:  Rate/Rhythem: 88SR  BP:  Supine:   Sitting: 124/76  Standing:    SaO2:  0750-0850 Pt walked 550 ft on RA with steady gait. Tolerated well. No CP. Education completed with pt and wife. Understanding voiced. Permission given to refer to GsO Phase 2.  Duanne Limerick

## 2012-03-06 NOTE — Discharge Summary (Signed)
Patient ID: Eric [name] MRN: [id number] DOB/AGE: [birth year] 72 y.o.  Admit date: 03/02/2012 Discharge date: 03/06/2012  Patient Active Problem List  Diagnosis  . Lymphoma  . Chest pain at rest  . Antithrombotic drugs (platelet-aggregation inhibitors) causing adverse effect in therapeutic use  . Non-STEMI (non-ST elevated myocardial infarction)    Primary Discharge Diagnosis: 1. Inferior wall non-ST elevation myocardial infarction  Secondary Discharge Diagnosis: 1. Non-Hodgkin's lymphoma  2. Clopidogrel hypo-responsiveness  Significant Diagnostic Studies: Diagnostic cardiac catheterization 03/03/2012  PCI with DES implantation RCA, 03/04/2012  Consults: None  Hospital Course: The patient was admitted to the hospital with vague chest discomfort upon presentation to the [facility] long emergency room. Troponins were elevated despite initially normal and EKGs. No ST elevation was noted. The discomfort was relieved by IV and some little nitroglycerin. Subsequent markers were elevated and on the following morning that was inferior T-wave inversion with small Q waves. He therefore presented with a non-ST elevation myocardial infarction. He underwent diagnostic catheterization and was found to have a 99+ percent stenosis in the mid right coronary with sluggish antegrade flow. Distal collaterals were noted. Moderate disease in the proximal LAD was noted. No intervention was performed immediately.  Over the next 24 hours the patient continued to have episodes of chest discomfort similar to admission. Because of this he was taken back to the laboratory on 03/04/2012 where drug-eluting stent implantation in the mid right coronary was performed. Unfortunately the target area was diffusely diseased and calcified. A total of greater than 60 mm of overlapping stent was placed from the distal to the proximal right coronary. No complications occurred during the procedure. He has been asymptomatic  since stent implantation.  On the day following the procedure the patient's platelet reactivity was assessed with a P2Y12 assay which revealed that he was Plavix/clopidogrel hypo-responder. He was switched to FE and and on the morning of discharge the P2 Y12 assay revealed an appropriate therapeutic response with 118 PRU.   Discharge Exam: Blood pressure 124/67, pulse 66, temperature 98.6 F (37 C), temperature source Oral, resp. rate 16, height 5\' 7"  (1.702 m), weight 69.8 kg (153 lb 14.1 oz), SpO2 96.00%.   Normal exam. Both the radial and femoral cath sites are unremarkable. The cardiac exam reveals a murmur, rub, click, or gallop Labs:   Lab Results  Component Value Date   WBC 4.2 03/04/2012   HGB 11.1* 03/04/2012   HCT 32.3* 03/04/2012   MCV 89.7 03/04/2012   PLT 170 03/04/2012    Lab 03/05/12 0545 03/03/12 0030  NA 142 --  K 3.3* --  CL 107 --  CO2 23 --  BUN 9 --  CREATININE 0.92 --  CALCIUM 8.5 --  PROT -- 5.5*  BILITOT -- 0.2*  ALKPHOS -- 86  ALT -- 25  AST -- 97*  GLUCOSE 83 --   Lab Results  Component Value Date   TROPONINI 10.57* 03/03/2012      Radiology: No acute abnormality EKG: Inferior infarct  FOLLOW UP PLANS AND APPOINTMENTS Discharge Orders    Future Appointments: Provider: Department: Dept Phone: Center:   03/25/2012 9:15 AM Beverely Pace [facility] MEDICAL ONCOLOGY [phone] None   03/25/2012 9:45 AM Conni Slipper, PA [facility] [phone] None   03/25/2012 10:30 AM [facility] [phone] None     Future Orders Please Complete By Expires   Amb Referral to Cardiac Rehabilitation  Medication List     As of 03/06/2012  9:17 AM    TAKE these medications         aspirin 325 MG EC tablet   Take 1 tablet (325 mg total) by mouth daily.      atorvastatin 40 MG tablet   Commonly known as: LIPITOR   Take 1 tablet (40 mg total) by  mouth daily at 6 PM.      cyanocobalamin 1000 MCG tablet   Take 100 mcg by mouth daily.      diphenhydrAMINE 25 MG tablet   Commonly known as: BENADRYL   Take 25 mg by mouth at bedtime as needed. Sleep      ferrous sulfate 325 (65 FE) MG tablet   Take 325 mg by mouth daily with breakfast.      nitroGLYCERIN 0.4 MG SL tablet   Commonly known as: NITROSTAT   Place 1 tablet (0.4 mg total) under the tongue every 5 (five) minutes x 3 doses as needed for chest pain.      prasugrel 10 MG Tabs   Commonly known as: EFFIENT   Take 1 tablet (10 mg total) by mouth daily.      ranitidine 150 MG tablet   Commonly known as: ZANTAC   Take 150 mg by mouth 2 (two) times daily as needed. Acid reflux           Follow-up Information    Follow up with Lesleigh Noe, MD. On 03/17/2012. (10:00A)    Contact information:   [address] [place] [address] [phone]          BRING ALL MEDICATIONS WITH YOU TO FOLLOW UP APPOINTMENTS  Time spent with patient to include physician time:  30 minutes Signed: Lesleigh Noe 03/06/2012, 9:17 AM

## 2012-03-20 ENCOUNTER — Encounter (HOSPITAL_COMMUNITY)
Admission: RE | Admit: 2012-03-20 | Discharge: 2012-03-20 | Disposition: A | Discharge status: Home / Self Care | Payer: Medicare Other | Source: Ambulatory Visit | Attending: Interventional Cardiology | Admitting: Interventional Cardiology

## 2012-03-20 DIAGNOSIS — Z5189 Encounter for other specified aftercare: Secondary | ICD-10-CM | POA: Insufficient documentation

## 2012-03-20 DIAGNOSIS — I214 Non-ST elevation (NSTEMI) myocardial infarction: Secondary | ICD-10-CM | POA: Insufficient documentation

## 2012-03-20 NOTE — Progress Notes (Signed)
Cardiac Rehab Medication Review by a Pharmacist  Does the patient  feel that his/her medications are working for him/her?  yes  Has the patient been experiencing any side effects to the medications prescribed?  no  Does the patient measure his/her own blood pressure or blood glucose at home?  no   Does the patient have any problems obtaining medications due to transportation or finances?   no  Understanding of regimen: excellent Understanding of indications: excellent Potential of compliance: excellent    Pharmacist comments: Pt knows all of his medications well. No issues or questions.    Eric [name], Eric [name] 03/20/2012 8:47 AM

## 2012-03-24 ENCOUNTER — Encounter (HOSPITAL_COMMUNITY)
Admission: RE | Admit: 2012-03-24 | Discharge: 2012-03-24 | Disposition: A | Discharge status: Home / Self Care | Payer: Medicare Other | Source: Ambulatory Visit | Attending: Interventional Cardiology | Admitting: Interventional Cardiology

## 2012-03-24 ENCOUNTER — Encounter (HOSPITAL_COMMUNITY): Payer: Self-pay

## 2012-03-24 ENCOUNTER — Encounter (HOSPITAL_COMMUNITY): Payer: Medicare Other

## 2012-03-24 NOTE — Progress Notes (Signed)
Pt started cardiac rehab today.  Pt tolerated light exercise without difficulty.  VSS, telemetry-NSR occasional PVC.  Asymptomatic with exercise.  Pt oriented to exercise equipment and routine.  Understanding verbalized.

## 2012-03-25 ENCOUNTER — Telehealth: Payer: Self-pay | Admitting: Internal Medicine

## 2012-03-25 ENCOUNTER — Ambulatory Visit (HOSPITAL_BASED_OUTPATIENT_CLINIC_OR_DEPARTMENT_OTHER): Payer: Medicare Other

## 2012-03-25 ENCOUNTER — Telehealth: Payer: Self-pay | Admitting: *Deleted

## 2012-03-25 ENCOUNTER — Encounter: Payer: Self-pay | Admitting: Physician Assistant

## 2012-03-25 ENCOUNTER — Ambulatory Visit (HOSPITAL_BASED_OUTPATIENT_CLINIC_OR_DEPARTMENT_OTHER): Payer: Medicare Other | Admitting: Physician Assistant

## 2012-03-25 ENCOUNTER — Other Ambulatory Visit (HOSPITAL_BASED_OUTPATIENT_CLINIC_OR_DEPARTMENT_OTHER): Payer: Medicare Other | Admitting: Lab

## 2012-03-25 VITALS — BP 131/74 | HR 71 | Temp 98.3°F | Resp 18 | Ht 67.25 in | Wt 149.6 lb

## 2012-03-25 VITALS — BP 112/55 | HR 79 | Temp 98.5°F | Resp 18

## 2012-03-25 DIAGNOSIS — C8589 Other specified types of non-Hodgkin lymphoma, extranodal and solid organ sites: Secondary | ICD-10-CM

## 2012-03-25 DIAGNOSIS — C8590 Non-Hodgkin lymphoma, unspecified, unspecified site: Secondary | ICD-10-CM

## 2012-03-25 DIAGNOSIS — Z5112 Encounter for antineoplastic immunotherapy: Secondary | ICD-10-CM

## 2012-03-25 LAB — CBC WITH DIFFERENTIAL/PLATELET
BASO%: 0.4 % (ref 0.0–2.0)
Basophils Absolute: 0 10*3/uL (ref 0.0–0.1)
EOS%: 2.7 % (ref 0.0–7.0)
HCT: 40.4 % (ref 38.4–49.9)
HGB: 13.7 g/dL (ref 13.0–17.1)
LYMPH%: 25.8 % (ref 14.0–49.0)
MCH: 30.4 pg (ref 27.2–33.4)
MCHC: 33.9 g/dL (ref 32.0–36.0)
MCV: 89.6 fL (ref 79.3–98.0)
MONO%: 13.1 % (ref 0.0–14.0)
NEUT%: 58 % (ref 39.0–75.0)
Platelets: 212 10*3/uL (ref 140–400)
lymph#: 1.3 10*3/uL (ref 0.9–3.3)

## 2012-03-25 LAB — COMPREHENSIVE METABOLIC PANEL (CC13)
AST: 28 U/L (ref 5–34)
Albumin: 3.8 g/dL (ref 3.5–5.0)
Alkaline Phosphatase: 156 U/L — ABNORMAL HIGH (ref 40–150)
BUN: 12 mg/dL (ref 7.0–26.0)
Creatinine: 0.9 mg/dL (ref 0.7–1.3)
Glucose: 86 mg/dl (ref 70–99)
Total Bilirubin: 0.54 mg/dL (ref 0.20–1.20)

## 2012-03-25 MED ORDER — SODIUM CHLORIDE 0.9 % IV SOLN
Freq: Once | INTRAVENOUS | Status: AC
  Administered 2012-03-25: 12:00:00 via INTRAVENOUS

## 2012-03-25 MED ORDER — ACETAMINOPHEN 325 MG PO TABS
650.0000 mg | ORAL_TABLET | Freq: Once | ORAL | Status: AC
  Administered 2012-03-25: 650 mg via ORAL

## 2012-03-25 MED ORDER — DIPHENHYDRAMINE HCL 25 MG PO CAPS
50.0000 mg | ORAL_CAPSULE | Freq: Once | ORAL | Status: AC
  Administered 2012-03-25: 50 mg via ORAL

## 2012-03-25 MED ORDER — SODIUM CHLORIDE 0.9 % IV SOLN
375.0000 mg/m2 | Freq: Once | INTRAVENOUS | Status: AC
  Administered 2012-03-25: 700 mg via INTRAVENOUS
  Filled 2012-03-25: qty 70

## 2012-03-25 NOTE — Telephone Encounter (Signed)
Talked to patient and gave him appt for March 2014 lab, chemo and ML

## 2012-03-25 NOTE — Telephone Encounter (Signed)
Per staff message and POF I have scheduled appts.  JMW  

## 2012-03-25 NOTE — Patient Instructions (Signed)
[facility] Discharge Instructions for Patients Receiving Chemotherapy  Today you received the following chemotherapy agents rituxan   To help prevent nausea and vomiting after your treatment, we encourage you to take your nausea medication as directed .   If you develop nausea and vomiting that is not controlled by your nausea medication, call the clinic. If it is after clinic hours your family physician or the after hours number for the clinic or go to the Emergency Department.   BELOW ARE SYMPTOMS THAT SHOULD BE REPORTED IMMEDIATELY:  *FEVER GREATER THAN 100.5 F  *CHILLS WITH OR WITHOUT FEVER  NAUSEA AND VOMITING THAT IS NOT CONTROLLED WITH YOUR NAUSEA MEDICATION  *UNUSUAL SHORTNESS OF BREATH  *UNUSUAL BRUISING OR BLEEDING  TENDERNESS IN MOUTH AND THROAT WITH OR WITHOUT PRESENCE OF ULCERS  *URINARY PROBLEMS  *BOWEL PROBLEMS  UNUSUAL RASH Items with * indicate a potential emergency and should be followed up as soon as possible.  One of the nurses will contact you 24 hours after your treatment. Please let the nurse know about any problems that you may have experienced. Feel free to call the clinic you have any questions or concerns. The clinic phone number is [phone].   I have been informed and understand all the instructions given to me. I know to contact the clinic, my physician, or go to the Emergency Department if any problems should occur. I do not have any questions at this time, but understand that I may call the clinic during office hours   should I have any questions or need assistance in obtaining follow up care.    __________________________________________  _____________  __________ Signature of Patient or Authorized Representative            Date                   Time    __________________________________________ Nurse's Signature

## 2012-03-25 NOTE — Patient Instructions (Addendum)
Follow up in 2 months prior to your next cycle of Maintenance Rituxan

## 2012-03-25 NOTE — Progress Notes (Signed)
[facility] [phone]   Fax:(336) [phone]  OFFICE PROGRESS NOTE  Tally Due, MD [address] [place] [address]  PRINCIPAL DIAGNOSIS: Bulky stage IV low grade lymphoma diagnosed in November 2009.   PRIOR THERAPY:  1. Status post 7 cycles of systemic chemotherapy with CHOP/Rituxan. Last dose was given May 2010. 2. Status post 6 cycles of maintenance Rituxan 375 mg/sq m given every 2 months. Last dose was given on May 29, 2010, and the patient was lost to followup at that time. He received another dose on 12/11/2010, then again missed 2 doses. 3. systemic chemotherapy with Rituxan 375 mg/M2 on day 1 and that bendamustine 90 mg/M2 on days 1 and 2 every 4 weeks, status post 6 cycles.  CURRENT THERAPY:  Maintenance Rituxan 375 mg/M2 every 2 months, status post 1 cycle  INTERVAL HISTORY: Eric [name] 72 y.o. male returns to the clinic today for followup visit. The patient tolerated the last 6 cycles of systemic therapy with Rituxan and bendamustine fairly well with no significant adverse effects. He denied having any significant weight loss or night sweats. He has no palpable lymphadenopathy. The patient denied having any significant chest pain, shortness breath, cough or hemoptysis. He voices no specific complaints today. He is now status post one cycle of maintenance Rituxan is given every 2 months. He tolerated that first cycle of maintenance Rituxan without difficulty. He voices no specific complaints today. He presents to proceed with his first cycle of maintenance Rituxan to be given every 2 months. His medication list is been reviewed and updated.  ALLERGIES:  is allergic to neosporin.  MEDICATIONS:  Current Outpatient Prescriptions  Medication Sig Dispense Refill  . aspirin EC 325 MG EC tablet Take 1 tablet (325 mg total) by mouth daily.  30 tablet    . atorvastatin (LIPITOR) 40 MG tablet Take 1 tablet (40 mg total) by mouth daily at  6 PM.  30 tablet  11  . cyanocobalamin 1000 MCG tablet Take 100 mcg by mouth daily.      . diphenhydrAMINE (BENADRYL) 25 MG tablet Take 25 mg by mouth at bedtime as needed. Sleep      . ferrous sulfate 325 (65 FE) MG tablet Take 325 mg by mouth daily with breakfast.      . nitroGLYCERIN (NITROSTAT) 0.4 MG SL tablet Place 1 tablet (0.4 mg total) under the tongue every 5 (five) minutes x 3 doses as needed for chest pain.  25 tablet  3  . prasugrel (EFFIENT) 10 MG TABS Take 1 tablet (10 mg total) by mouth daily.  30 tablet  11  . ranitidine (ZANTAC) 150 MG tablet Take 150 mg by mouth 2 (two) times daily as needed. Acid reflux        REVIEW OF SYSTEMS:  A comprehensive review of systems was negative.   PHYSICAL EXAMINATION: General appearance: alert, cooperative and no distress Head: Normocephalic, without obvious abnormality, atraumatic Neck: no adenopathy Lymph nodes: Cervical, supraclavicular, and axillary nodes normal. Resp: clear to auscultation bilaterally Cardio: regular rate and rhythm, S1, S2 normal, no murmur, click, rub or gallop GI: soft, non-tender; bowel sounds normal; no masses,  no organomegaly Extremities: extremities normal, atraumatic, no cyanosis or edema Neurologic: Alert and oriented X 3, normal strength and tone. Normal symmetric reflexes. Normal coordination and gait  ECOG PERFORMANCE STATUS: 1 - Symptomatic but completely ambulatory  Blood pressure 131/74, pulse 71, temperature 98.3 F (36.8 C), temperature source Oral, resp.  rate 18, height 5' 7.25" (1.708 m), weight 149 lb 9.6 oz (67.858 kg).  LABORATORY DATA: Lab Results  Component Value Date   WBC 4.9 03/25/2012   HGB 13.7 03/25/2012   HCT 40.4 03/25/2012   MCV 89.6 03/25/2012   PLT 212 03/25/2012      Chemistry      Component Value Date/Time   NA 142 03/05/2012 0545   NA 144 01/29/2012 0834   NA 141 03/23/2010 1301   K 3.3* 03/05/2012 0545   K 3.8 01/29/2012 0834   K 4.7 03/23/2010 1301   CL 107 03/05/2012  0545   CL 106 01/29/2012 0834   CL 99 03/23/2010 1301   CO2 23 03/05/2012 0545   CO2 28 01/29/2012 0834   CO2 26 03/23/2010 1301   BUN 9 03/05/2012 0545   BUN 12.0 01/29/2012 0834   BUN 12 03/23/2010 1301   CREATININE 0.92 03/05/2012 0545   CREATININE 0.8 01/29/2012 0834   CREATININE 1.0 03/23/2010 1301      Component Value Date/Time   CALCIUM 8.5 03/05/2012 0545   CALCIUM 8.8 01/29/2012 0834   CALCIUM 9.1 03/23/2010 1301   ALKPHOS 86 03/03/2012 0030   ALKPHOS 100 01/29/2012 0834   ALKPHOS 78 03/23/2010 1301   AST 97* 03/03/2012 0030   AST 22 01/29/2012 0834   AST 25 03/23/2010 1301   ALT 25 03/03/2012 0030   ALT 19 01/29/2012 0834   BILITOT 0.2* 03/03/2012 0030   BILITOT 0.49 01/29/2012 0834   BILITOT 0.60 03/23/2010 1301       RADIOGRAPHIC STUDIES: Nm Pet Image Restag (ps) Skull Base To Thigh  11/23/2011  *RADIOLOGY REPORT*  Clinical Data: Subsequent treatment strategy for lymphoma.  NUCLEAR MEDICINE PET SKULL BASE TO THIGH  Fasting Blood Glucose:  96  Technique:  17.0 mCi F-18 FDG was injected intravenously. CT data was obtained and used for attenuation correction and anatomic localization only.  (This was not acquired as a diagnostic CT examination.) Additional exam technical data entered on technologist worksheet.  Comparison:  04/28/2008; 08/30/2011  Findings:  Neck: Symmetric salivary gland, tonsillar, and tongue activity appears benign.  No hypermetabolic adenopathy in the neck. Incidental mucous retention cyst right maxillary sinus.  Chest:  No pathologic hypermetabolic activity the chest.  Coronary artery calcification noted.  Old granulomatous disease noted.  Abdomen/Pelvis:  No abnormal hypermetabolic nodal activity in the abdomen or pelvis.  Note is again made of marked enlargement prostate gland with generalized low-level prostate activity.  Sigmoid diverticulosis noted with aortoiliac atherosclerosis. Photopenic right renal cyst.  Partially duplicated left collecting system.   Skeleton:  No focal hypermetabolic activity to suggest skeletal metastasis.  IMPRESSION:  1.  No findings of residual hypermetabolic adenopathy to suggest residual or recurrent malignancy. 2.  Chronic right maxillary sinusitis. 3.  Sigmoid diverticulosis. 4.  Right renal cyst. 5.  Prominently enlarged but stable prostate gland. 6.  Partially duplicated left collecting system. 7.  Coronary artery atherosclerosis.   Original Report Authenticated By: Dellia Cloud, M.D.     ASSESSMENT/PLAN: This is a very pleasant 72 years old [place] American male with history of bulky stage IV low-grade lymphoma most recently completed 6 cycles of systemic therapy with Rituxan and bendamustine was almost complete response and no evidence for residual hypermetabolic lymphadenopathy on the scan. Patient was discussed with Dr. Arbutus Ped. He will proceed with his cycle of maintenance Rituxan 375 mg per meter squared given every 2 months as scheduled today.. A total of 12 cycles  are planned. He'll return in 2 months with repeat CBC differential, C. met and LDH prior to his next cycle of maintenance Rituxan.  Eric [name], Eric Ohagan E, PA-C   All questions were answered. The patient knows to call the clinic with any problems, questions or concerns. We can certainly see the patient much sooner if necessary.  I spent 20 minutes counseling the patient face to face. The total time spent in the appointment was 30 minutes.

## 2012-03-26 ENCOUNTER — Encounter (HOSPITAL_COMMUNITY)
Admission: RE | Admit: 2012-03-26 | Discharge: 2012-03-26 | Disposition: A | Discharge status: Home / Self Care | Payer: Medicare Other | Source: Ambulatory Visit | Attending: Interventional Cardiology | Admitting: Interventional Cardiology

## 2012-03-28 ENCOUNTER — Encounter (HOSPITAL_COMMUNITY)
Admission: RE | Admit: 2012-03-28 | Discharge: 2012-03-28 | Disposition: A | Discharge status: Home / Self Care | Payer: Medicare Other | Source: Ambulatory Visit | Attending: Interventional Cardiology | Admitting: Interventional Cardiology

## 2012-03-31 ENCOUNTER — Encounter (HOSPITAL_COMMUNITY)
Admission: RE | Admit: 2012-03-31 | Discharge: 2012-03-31 | Disposition: A | Discharge status: Home / Self Care | Payer: Medicare Other | Source: Ambulatory Visit | Attending: Interventional Cardiology | Admitting: Interventional Cardiology

## 2012-04-02 ENCOUNTER — Encounter (HOSPITAL_COMMUNITY): Payer: Medicare Other

## 2012-04-04 ENCOUNTER — Encounter (HOSPITAL_COMMUNITY)
Admission: RE | Admit: 2012-04-04 | Discharge: 2012-04-04 | Disposition: A | Discharge status: Home / Self Care | Payer: Medicare Other | Source: Ambulatory Visit | Attending: Interventional Cardiology | Admitting: Interventional Cardiology

## 2012-04-07 ENCOUNTER — Encounter (HOSPITAL_COMMUNITY)
Admission: RE | Admit: 2012-04-07 | Discharge: 2012-04-07 | Disposition: A | Discharge status: Home / Self Care | Payer: Medicare Other | Source: Ambulatory Visit | Attending: Interventional Cardiology | Admitting: Interventional Cardiology

## 2012-04-07 DIAGNOSIS — I214 Non-ST elevation (NSTEMI) myocardial infarction: Secondary | ICD-10-CM | POA: Insufficient documentation

## 2012-04-07 DIAGNOSIS — Z5189 Encounter for other specified aftercare: Secondary | ICD-10-CM | POA: Insufficient documentation

## 2012-04-09 ENCOUNTER — Encounter (HOSPITAL_COMMUNITY)
Admission: RE | Admit: 2012-04-09 | Discharge: 2012-04-09 | Disposition: A | Discharge status: Home / Self Care | Payer: Medicare Other | Source: Ambulatory Visit | Attending: Interventional Cardiology | Admitting: Interventional Cardiology

## 2012-04-11 ENCOUNTER — Encounter (HOSPITAL_COMMUNITY)
Admission: RE | Admit: 2012-04-11 | Discharge: 2012-04-11 | Disposition: A | Discharge status: Home / Self Care | Payer: Medicare Other | Source: Ambulatory Visit | Attending: Interventional Cardiology | Admitting: Interventional Cardiology

## 2012-04-14 ENCOUNTER — Encounter (HOSPITAL_COMMUNITY)
Admission: RE | Admit: 2012-04-14 | Discharge: 2012-04-14 | Disposition: A | Discharge status: Home / Self Care | Payer: Medicare Other | Source: Ambulatory Visit | Attending: Interventional Cardiology | Admitting: Interventional Cardiology

## 2012-04-16 ENCOUNTER — Encounter (HOSPITAL_COMMUNITY)
Admission: RE | Admit: 2012-04-16 | Discharge: 2012-04-16 | Disposition: A | Discharge status: Home / Self Care | Payer: Medicare Other | Source: Ambulatory Visit | Attending: Interventional Cardiology | Admitting: Interventional Cardiology

## 2012-04-18 ENCOUNTER — Encounter (HOSPITAL_COMMUNITY): Payer: Medicare Other

## 2012-04-21 ENCOUNTER — Encounter (HOSPITAL_COMMUNITY)
Admission: RE | Admit: 2012-04-21 | Discharge: 2012-04-21 | Disposition: A | Discharge status: Home / Self Care | Payer: Medicare Other | Source: Ambulatory Visit | Attending: Interventional Cardiology | Admitting: Interventional Cardiology

## 2012-04-23 ENCOUNTER — Encounter (HOSPITAL_COMMUNITY)
Admission: RE | Admit: 2012-04-23 | Discharge: 2012-04-23 | Disposition: A | Discharge status: Home / Self Care | Payer: Medicare Other | Source: Ambulatory Visit | Attending: Interventional Cardiology | Admitting: Interventional Cardiology

## 2012-04-23 NOTE — Progress Notes (Signed)
Eric [name] 72 y.o. male Nutrition Note Spoke with pt.  Nutrition Plan and Nutrition Survey goals reviewed with pt. Pt is following Step 2 of the Therapeutic Lifestyle Changes diet. Pt continues to use 2% milk and is willing to try 1%. Pt eats 5 servings of fruits and vegetables/day. Per discussion, pt tends to eat more vegetables than fruit. Pt wants to increase fruit consumption. Pt encouraged to eat a variety of colors of fruits and veggies. Pt expressed understanding of the information reviewed. Pt aware of nutrition education classes offered and plans on attending nutrition classes.  Nutrition Diagnosis   Food-and nutrition-related knowledge deficit related to lack of exposure to information as related to diagnosis of: ? CVD  Nutrition RX/ Estimated Daily Nutrition Needs for: wt maintenance 1950-2250 Kcal, 65-70 gm fat, 13-15 gm sat fat, 1.9-2.2 gm trans-fat, <1500 mg sodium   Nutrition Intervention   Pt's individual nutrition plan including cholesterol goals reviewed with pt.   Benefits of adopting Therapeutic Lifestyle Changes discussed when Medficts reviewed.   Pt to attend the Portion Distortion class   Pt to attend the  ? Nutrition I class                          ? Nutrition II class   Continue client-centered nutrition education by RD, as part of interdisciplinary care.  Goal(s)   Pt to describe the benefit of including fruits, vegetables, whole grains, and low-fat dairy products in a heart healthy meal plan.  Monitor and Evaluate progress toward nutrition goal with team. Nutrition Risk:  Low

## 2012-04-25 ENCOUNTER — Encounter (HOSPITAL_COMMUNITY)
Admission: RE | Admit: 2012-04-25 | Discharge: 2012-04-25 | Disposition: A | Discharge status: Home / Self Care | Payer: Medicare Other | Source: Ambulatory Visit | Attending: Interventional Cardiology | Admitting: Interventional Cardiology

## 2012-04-28 ENCOUNTER — Encounter (HOSPITAL_COMMUNITY)
Admission: RE | Admit: 2012-04-28 | Discharge: 2012-04-28 | Disposition: A | Discharge status: Home / Self Care | Payer: Medicare Other | Source: Ambulatory Visit | Attending: Interventional Cardiology | Admitting: Interventional Cardiology

## 2012-04-30 ENCOUNTER — Telehealth: Payer: Self-pay | Admitting: Internal Medicine

## 2012-04-30 ENCOUNTER — Encounter (HOSPITAL_COMMUNITY)
Admission: RE | Admit: 2012-04-30 | Discharge: 2012-04-30 | Disposition: A | Discharge status: Home / Self Care | Payer: Medicare Other | Source: Ambulatory Visit | Attending: Interventional Cardiology | Admitting: Interventional Cardiology

## 2012-05-02 ENCOUNTER — Encounter (HOSPITAL_COMMUNITY)
Admission: RE | Admit: 2012-05-02 | Discharge: 2012-05-02 | Disposition: A | Discharge status: Home / Self Care | Payer: Medicare Other | Source: Ambulatory Visit | Attending: Interventional Cardiology | Admitting: Interventional Cardiology

## 2012-05-05 ENCOUNTER — Encounter (HOSPITAL_COMMUNITY)
Admission: RE | Admit: 2012-05-05 | Discharge: 2012-05-05 | Disposition: A | Discharge status: Home / Self Care | Payer: Medicare Other | Source: Ambulatory Visit | Attending: Interventional Cardiology | Admitting: Interventional Cardiology

## 2012-05-05 DIAGNOSIS — I214 Non-ST elevation (NSTEMI) myocardial infarction: Secondary | ICD-10-CM | POA: Insufficient documentation

## 2012-05-05 DIAGNOSIS — Z5189 Encounter for other specified aftercare: Secondary | ICD-10-CM | POA: Insufficient documentation

## 2012-05-07 ENCOUNTER — Encounter (HOSPITAL_COMMUNITY)
Admission: RE | Admit: 2012-05-07 | Discharge: 2012-05-07 | Disposition: A | Discharge status: Home / Self Care | Payer: Medicare Other | Source: Ambulatory Visit | Attending: Interventional Cardiology | Admitting: Interventional Cardiology

## 2012-05-09 ENCOUNTER — Encounter (HOSPITAL_COMMUNITY): Payer: Medicare Other

## 2012-05-12 ENCOUNTER — Encounter (HOSPITAL_COMMUNITY)
Admission: RE | Admit: 2012-05-12 | Discharge: 2012-05-12 | Disposition: A | Discharge status: Home / Self Care | Payer: Medicare Other | Source: Ambulatory Visit | Attending: Interventional Cardiology | Admitting: Interventional Cardiology

## 2012-05-14 ENCOUNTER — Encounter (HOSPITAL_COMMUNITY)
Admission: RE | Admit: 2012-05-14 | Discharge: 2012-05-14 | Disposition: A | Discharge status: Home / Self Care | Payer: Medicare Other | Source: Ambulatory Visit | Attending: Interventional Cardiology | Admitting: Interventional Cardiology

## 2012-05-16 ENCOUNTER — Encounter (HOSPITAL_COMMUNITY)
Admission: RE | Admit: 2012-05-16 | Discharge: 2012-05-16 | Disposition: A | Discharge status: Home / Self Care | Payer: Medicare Other | Source: Ambulatory Visit | Attending: Interventional Cardiology | Admitting: Interventional Cardiology

## 2012-05-19 ENCOUNTER — Encounter (HOSPITAL_COMMUNITY)
Admission: RE | Admit: 2012-05-19 | Discharge: 2012-05-19 | Disposition: A | Discharge status: Home / Self Care | Payer: Medicare Other | Source: Ambulatory Visit | Attending: Interventional Cardiology | Admitting: Interventional Cardiology

## 2012-05-21 ENCOUNTER — Encounter (HOSPITAL_COMMUNITY)
Admission: RE | Admit: 2012-05-21 | Discharge: 2012-05-21 | Disposition: A | Discharge status: Home / Self Care | Payer: Medicare Other | Source: Ambulatory Visit | Attending: Interventional Cardiology | Admitting: Interventional Cardiology

## 2012-05-23 ENCOUNTER — Encounter (HOSPITAL_COMMUNITY): Payer: Medicare Other

## 2012-05-23 ENCOUNTER — Other Ambulatory Visit: Payer: Self-pay | Admitting: Oncology

## 2012-05-23 ENCOUNTER — Ambulatory Visit (HOSPITAL_BASED_OUTPATIENT_CLINIC_OR_DEPARTMENT_OTHER): Payer: Medicare Other | Admitting: Physician Assistant

## 2012-05-23 ENCOUNTER — Ambulatory Visit: Payer: Medicare Other | Admitting: Physician Assistant

## 2012-05-23 ENCOUNTER — Ambulatory Visit (HOSPITAL_BASED_OUTPATIENT_CLINIC_OR_DEPARTMENT_OTHER): Payer: Medicare Other | Admitting: Lab

## 2012-05-23 ENCOUNTER — Ambulatory Visit: Payer: Medicare Other

## 2012-05-23 ENCOUNTER — Telehealth: Payer: Self-pay | Admitting: Internal Medicine

## 2012-05-23 ENCOUNTER — Encounter: Payer: Self-pay | Admitting: Oncology

## 2012-05-23 ENCOUNTER — Encounter: Payer: Self-pay | Admitting: Physician Assistant

## 2012-05-23 VITALS — BP 126/80 | HR 78 | Temp 97.9°F | Resp 18 | Ht 67.25 in | Wt 147.7 lb

## 2012-05-23 VITALS — BP 114/70 | HR 71 | Temp 98.6°F | Resp 18

## 2012-05-23 DIAGNOSIS — C8590 Non-Hodgkin lymphoma, unspecified, unspecified site: Secondary | ICD-10-CM

## 2012-05-23 DIAGNOSIS — C8589 Other specified types of non-Hodgkin lymphoma, extranodal and solid organ sites: Secondary | ICD-10-CM

## 2012-05-23 DIAGNOSIS — Z5112 Encounter for antineoplastic immunotherapy: Secondary | ICD-10-CM

## 2012-05-23 DIAGNOSIS — G4700 Insomnia, unspecified: Secondary | ICD-10-CM

## 2012-05-23 LAB — COMPREHENSIVE METABOLIC PANEL (CC13)
Albumin: 3.9 g/dL (ref 3.5–5.0)
BUN: 14.6 mg/dL (ref 7.0–26.0)
CO2: 26 mEq/L (ref 22–29)
Glucose: 82 mg/dl (ref 70–99)
Potassium: 4.2 mEq/L (ref 3.5–5.1)
Sodium: 141 mEq/L (ref 136–145)
Total Bilirubin: 0.51 mg/dL (ref 0.20–1.20)
Total Protein: 6.5 g/dL (ref 6.4–8.3)

## 2012-05-23 LAB — CBC WITH DIFFERENTIAL/PLATELET
Basophils Absolute: 0 10*3/uL (ref 0.0–0.1)
EOS%: 9.2 % — ABNORMAL HIGH (ref 0.0–7.0)
Eosinophils Absolute: 0.5 10*3/uL (ref 0.0–0.5)
HCT: 41.7 % (ref 38.4–49.9)
HGB: 14.2 g/dL (ref 13.0–17.1)
MCH: 30.6 pg (ref 27.2–33.4)
MCV: 89.9 fL (ref 79.3–98.0)
MONO%: 10.9 % (ref 0.0–14.0)
NEUT#: 2.9 10*3/uL (ref 1.5–6.5)
NEUT%: 50 % (ref 39.0–75.0)
RDW: 14.4 % (ref 11.0–14.6)

## 2012-05-23 LAB — LACTATE DEHYDROGENASE (CC13): LDH: 243 U/L (ref 125–245)

## 2012-05-23 MED ORDER — DIPHENHYDRAMINE HCL 25 MG PO CAPS
50.0000 mg | ORAL_CAPSULE | Freq: Once | ORAL | Status: AC
  Administered 2012-05-23: 50 mg via ORAL

## 2012-05-23 MED ORDER — SODIUM CHLORIDE 0.9 % IV SOLN
375.0000 mg/m2 | Freq: Once | INTRAVENOUS | Status: AC
  Administered 2012-05-23: 700 mg via INTRAVENOUS
  Filled 2012-05-23: qty 70

## 2012-05-23 MED ORDER — ZOLPIDEM TARTRATE 10 MG PO TABS: 10.0000 mg | ORAL_TABLET | Freq: Every evening | ORAL | Status: DC | PRN

## 2012-05-23 MED ORDER — SODIUM CHLORIDE 0.9 % IV SOLN
Freq: Once | INTRAVENOUS | Status: AC
  Administered 2012-05-23: 11:00:00 via INTRAVENOUS

## 2012-05-23 MED ORDER — ACETAMINOPHEN 325 MG PO TABS
650.0000 mg | ORAL_TABLET | Freq: Once | ORAL | Status: AC
  Administered 2012-05-23: 650 mg via ORAL

## 2012-05-23 NOTE — Progress Notes (Signed)
Discharged at 1330, ambulatory in no distress. 

## 2012-05-23 NOTE — Progress Notes (Signed)
[facility] Telephone:(336) [phone]   Fax:(336) [phone]  OFFICE PROGRESS NOTE  Tally Due, MD [address] [place] [address]  PRINCIPAL DIAGNOSIS: Bulky stage IV low grade lymphoma diagnosed in November 2009.   PRIOR THERAPY:  1. Status post 7 cycles of systemic chemotherapy with CHOP/Rituxan. Last dose was given May 2010. 2. Status post 6 cycles of maintenance Rituxan 375 mg/sq m given every 2 months. Last dose was given on May 29, 2010, and the patient was lost to followup at that time. He received another dose on 12/11/2010, then again missed 2 doses. 3. systemic chemotherapy with Rituxan 375 mg/M2 on day 1 and that bendamustine 90 mg/M2 on days 1 and 2 every 4 weeks, status post 6 cycles.  CURRENT THERAPY:  Maintenance Rituxan 375 mg/M2 every 2 months, status post 2 cycles  INTERVAL HISTORY: Eric [name] 72 y.o. male returns to the clinic today for followup visit. The patient tolerated the last 6 cycles of systemic therapy with Rituxan and bendamustine fairly well with no significant adverse effects. He denied having any significant weight loss or night sweats. He has no palpable lymphadenopathy. The patient denied having any significant chest pain, shortness breath, cough or hemoptysis. He voices no specific complaints today. He is now status post 2 cycles of maintenance Rituxan is given every 2 months. He is tolerating his maintenance Rituxan without difficulty. He voices no specific complaints today. He presents to proceed with his third cycle of maintenance Rituxan to be given every 2 months. His medication list is been reviewed and updated. He requests a refill for his Ambien.  ALLERGIES:  is allergic to neosporin.  MEDICATIONS:  Current Outpatient Prescriptions  Medication Sig Dispense Refill  . aspirin EC 325 MG EC tablet Take 1 tablet (325 mg total) by mouth daily.  30 tablet    . atorvastatin (LIPITOR) 40 MG tablet Take 1 tablet (40 mg  total) by mouth daily at 6 PM.  30 tablet  11  . cyanocobalamin 1000 MCG tablet Take 100 mcg by mouth daily.      . diphenhydrAMINE (BENADRYL) 25 MG tablet Take 25 mg by mouth at bedtime as needed. Sleep      . ferrous sulfate 325 (65 FE) MG tablet Take 325 mg by mouth daily with breakfast.      . nitroGLYCERIN (NITROSTAT) 0.4 MG SL tablet Place 1 tablet (0.4 mg total) under the tongue every 5 (five) minutes x 3 doses as needed for chest pain.  25 tablet  3  . prasugrel (EFFIENT) 10 MG TABS Take 1 tablet (10 mg total) by mouth daily.  30 tablet  11  . ranitidine (ZANTAC) 150 MG tablet Take 150 mg by mouth 2 (two) times daily as needed. Acid reflux      . zolpidem (AMBIEN) 10 MG tablet Take 1 tablet (10 mg total) by mouth at bedtime as needed for sleep.  30 tablet  0   No current facility-administered medications for this visit.    REVIEW OF SYSTEMS:  A comprehensive review of systems was negative.   PHYSICAL EXAMINATION: General appearance: alert, cooperative and no distress Head: Normocephalic, without obvious abnormality, atraumatic Neck: no adenopathy Lymph nodes: Cervical, supraclavicular, and axillary nodes normal. Resp: clear to auscultation bilaterally Cardio: regular rate and rhythm, S1, S2 normal, no murmur, click, rub or gallop GI: soft, non-tender; bowel sounds normal; no masses,  no organomegaly Extremities: extremities normal, atraumatic, no cyanosis or edema Neurologic: Alert  and oriented X 3, normal strength and tone. Normal symmetric reflexes. Normal coordination and gait  ECOG PERFORMANCE STATUS: 1 - Symptomatic but completely ambulatory  Blood pressure 126/80, pulse 78, temperature 97.9 F (36.6 C), temperature source Oral, resp. rate 18, height 5' 7.25" (1.708 m), weight 147 lb 11.2 oz (66.996 kg).  LABORATORY DATA: Lab Results  Component Value Date   WBC 5.8 05/23/2012   HGB 14.2 05/23/2012   HCT 41.7 05/23/2012   MCV 89.9 05/23/2012   PLT 205 05/23/2012       Chemistry      Component Value Date/Time   NA 141 05/23/2012 0943   NA 142 03/05/2012 0545   NA 141 03/23/2010 1301   K 4.2 05/23/2012 0943   K 3.3* 03/05/2012 0545   K 4.7 03/23/2010 1301   CL 104 05/23/2012 0943   CL 107 03/05/2012 0545   CL 99 03/23/2010 1301   CO2 26 05/23/2012 0943   CO2 23 03/05/2012 0545   CO2 26 03/23/2010 1301   BUN 14.6 05/23/2012 0943   BUN 9 03/05/2012 0545   BUN 12 03/23/2010 1301   CREATININE 1.0 05/23/2012 0943   CREATININE 0.92 03/05/2012 0545   CREATININE 1.0 03/23/2010 1301      Component Value Date/Time   CALCIUM 9.5 05/23/2012 0943   CALCIUM 8.5 03/05/2012 0545   CALCIUM 9.1 03/23/2010 1301   ALKPHOS 129 05/23/2012 0943   ALKPHOS 86 03/03/2012 0030   ALKPHOS 78 03/23/2010 1301   AST 28 05/23/2012 0943   AST 97* 03/03/2012 0030   AST 25 03/23/2010 1301   ALT 33 05/23/2012 0943   ALT 25 03/03/2012 0030   BILITOT 0.51 05/23/2012 0943   BILITOT 0.2* 03/03/2012 0030   BILITOT 0.60 03/23/2010 1301       RADIOGRAPHIC STUDIES: Nm Pet Image Restag (ps) Skull Base To Thigh  11/23/2011  *RADIOLOGY REPORT*  Clinical Data: Subsequent treatment strategy for lymphoma.  NUCLEAR MEDICINE PET SKULL BASE TO THIGH  Fasting Blood Glucose:  96  Technique:  17.0 mCi F-18 FDG was injected intravenously. CT data was obtained and used for attenuation correction and anatomic localization only.  (This was not acquired as a diagnostic CT examination.) Additional exam technical data entered on technologist worksheet.  Comparison:  04/28/2008; 08/30/2011  Findings:  Neck: Symmetric salivary gland, tonsillar, and tongue activity appears benign.  No hypermetabolic adenopathy in the neck. Incidental mucous retention cyst right maxillary sinus.  Chest:  No pathologic hypermetabolic activity the chest.  Coronary artery calcification noted.  Old granulomatous disease noted.  Abdomen/Pelvis:  No abnormal hypermetabolic nodal activity in the abdomen or pelvis.  Note is again made of marked enlargement prostate  gland with generalized low-level prostate activity.  Sigmoid diverticulosis noted with aortoiliac atherosclerosis. Photopenic right renal cyst.  Partially duplicated left collecting system.  Skeleton:  No focal hypermetabolic activity to suggest skeletal metastasis.  IMPRESSION:  1.  No findings of residual hypermetabolic adenopathy to suggest residual or recurrent malignancy. 2.  Chronic right maxillary sinusitis. 3.  Sigmoid diverticulosis. 4.  Right renal cyst. 5.  Prominently enlarged but stable prostate gland. 6.  Partially duplicated left collecting system. 7.  Coronary artery atherosclerosis.   Original Report Authenticated By: Dellia Cloud, M.D.     ASSESSMENT/PLAN: This is a very pleasant 72 years old [place] American male with history of bulky stage IV low-grade lymphoma most recently completed 6 cycles of systemic therapy with Rituxan and bendamustine was almost complete response and no  evidence for residual hypermetabolic lymphadenopathy on the scan. Patient was discussed with in Dr. Darrold Span in Dr. Asa Lente absence. He will proceed with his cycle of maintenance Rituxan 375 mg per meter squared given every 2 months as scheduled today. A total of 12 cycles are planned. He'll followup with Dr. Arbutus Ped in 2 months with a restaging CT scan of the chest, abdomen and pelvis with contrast to reevaluate his disease prior to cycle #4 of his maintenance Rituxan. He'll also have repeat CBC differential, C. met and LDH prior to his next cycle of maintenance Rituxan. The patient was given a prescription for Ambien 10 mg tablets one by mouth each bedtime as needed for insomnia total of 30 with no refill.  Laural Benes, Eric Drapeau [name], Eric [name]   All questions were answered. The patient knows to call the clinic with any problems, questions or concerns. We can certainly see the patient much sooner if necessary.  I spent 20 minutes counseling the patient face to face. The total time spent in the appointment was 30  minutes.

## 2012-05-23 NOTE — Patient Instructions (Addendum)
Continue your maintenance Rituxan as scheduled Follow up with Dr. Arbutus Ped in 2 months with a restaging CT scan of your chest, abdomen and pelvis to re-evaluate your disease

## 2012-05-23 NOTE — Patient Instructions (Signed)
[facility] Discharge Instructions for Patients Receiving Chemotherapy  Today you received the following chemotherapy agents Rituxan  To help prevent nausea and vomiting after your treatment, we encourage you to take your nausea medication Benadryl or tylenol Begin taking it at anytime upon discharge and take it as often as prescribed for the next 72 hours.   If you develop nausea and vomiting that is not controlled by your nausea medication, call the clinic. If it is after clinic hours your family physician or the after hours number for the clinic or go to the Emergency Department.   BELOW ARE SYMPTOMS THAT SHOULD BE REPORTED IMMEDIATELY:  *FEVER GREATER THAN 100.5 F  *CHILLS WITH OR WITHOUT FEVER  NAUSEA AND VOMITING THAT IS NOT CONTROLLED WITH YOUR NAUSEA MEDICATION  *UNUSUAL SHORTNESS OF BREATH  *UNUSUAL BRUISING OR BLEEDING  TENDERNESS IN MOUTH AND THROAT WITH OR WITHOUT PRESENCE OF ULCERS  *URINARY PROBLEMS  *BOWEL PROBLEMS  UNUSUAL RASH Items with * indicate a potential emergency and should be followed up as soon as possible.  One of the nurses will contact you 24 hours after your treatment. Please let the nurse know about any problems that you may have experienced. Feel free to call the clinic you have any questions or concerns. The clinic phone number is [phone].   I have been informed and understand all the instructions given to me. I know to contact the clinic, my physician, or go to the Emergency Department if any problems should occur. I do not have any questions at this time, but understand that I may call the clinic during office hours   should I have any questions or need assistance in obtaining follow up care.    __________________________________________  _____________  __________ Signature of Patient or Authorized Representative            Date                   Time    __________________________________________ Nurse's  Signature

## 2012-05-23 NOTE — Patient Instructions (Signed)
[facility] Discharge Instructions for Patients Receiving Chemotherapy  Today you received the following chemotherapy agents Rituxan To help prevent nausea and vomiting after your treatment, we encourage you to take your nausea medication   Take it as often as prescribed.   If you develop nausea and vomiting that is not controlled by your nausea medication, call the clinic. If it is after clinic hours your family physician or the after hours number for the clinic or go to the Emergency Department.   BELOW ARE SYMPTOMS THAT SHOULD BE REPORTED IMMEDIATELY:  *FEVER GREATER THAN 100.5 F  *CHILLS WITH OR WITHOUT FEVER  NAUSEA AND VOMITING THAT IS NOT CONTROLLED WITH YOUR NAUSEA MEDICATION  *UNUSUAL SHORTNESS OF BREATH  *UNUSUAL BRUISING OR BLEEDING  TENDERNESS IN MOUTH AND THROAT WITH OR WITHOUT PRESENCE OF ULCERS  *URINARY PROBLEMS  *BOWEL PROBLEMS  UNUSUAL RASH Items with * indicate a potential emergency and should be followed up as soon as possible.  If this is your first treatment one of the nurses will contact you 24 hours after your treatment. Please let the nurse know about any problems that you may have experienced. Feel free to call the clinic you have any questions or concerns. The clinic phone number is [phone].   I have been informed and understand all the instructions given to me. I know to contact the clinic, my physician, or go to the Emergency Department if any problems should occur. I do not have any questions at this time, but understand that I may call the clinic during office hours   should I have any questions or need assistance in obtaining follow up care.    __________________________________________  _____________  __________ Signature of Patient or Authorized Representative            Date                   Time    __________________________________________ Nurse's Signature

## 2012-05-26 ENCOUNTER — Encounter (HOSPITAL_COMMUNITY)
Admission: RE | Admit: 2012-05-26 | Discharge: 2012-05-26 | Disposition: A | Discharge status: Home / Self Care | Payer: Medicare Other | Source: Ambulatory Visit | Attending: Interventional Cardiology | Admitting: Interventional Cardiology

## 2012-05-27 ENCOUNTER — Telehealth: Payer: Self-pay | Admitting: Internal Medicine

## 2012-05-27 ENCOUNTER — Telehealth: Payer: Self-pay | Admitting: *Deleted

## 2012-05-27 NOTE — Telephone Encounter (Signed)
, °

## 2012-05-27 NOTE — Telephone Encounter (Signed)
Per staff phone call and POF I have schedueld appts.  JMW  

## 2012-05-28 ENCOUNTER — Encounter (HOSPITAL_COMMUNITY): Payer: Medicare Other

## 2012-05-30 ENCOUNTER — Other Ambulatory Visit: Payer: Medicare Other

## 2012-05-30 ENCOUNTER — Encounter (HOSPITAL_COMMUNITY)
Admission: RE | Admit: 2012-05-30 | Discharge: 2012-05-30 | Disposition: A | Discharge status: Home / Self Care | Payer: Medicare Other | Source: Ambulatory Visit | Attending: Interventional Cardiology | Admitting: Interventional Cardiology

## 2012-06-02 ENCOUNTER — Encounter (HOSPITAL_COMMUNITY)
Admission: RE | Admit: 2012-06-02 | Discharge: 2012-06-02 | Disposition: A | Discharge status: Home / Self Care | Payer: Medicare Other | Source: Ambulatory Visit | Attending: Interventional Cardiology | Admitting: Interventional Cardiology

## 2012-06-04 ENCOUNTER — Encounter (HOSPITAL_COMMUNITY)
Admission: RE | Admit: 2012-06-04 | Discharge: 2012-06-04 | Disposition: A | Discharge status: Home / Self Care | Payer: Medicare Other | Source: Ambulatory Visit | Attending: Interventional Cardiology | Admitting: Interventional Cardiology

## 2012-06-04 DIAGNOSIS — Z5189 Encounter for other specified aftercare: Secondary | ICD-10-CM | POA: Insufficient documentation

## 2012-06-04 DIAGNOSIS — I214 Non-ST elevation (NSTEMI) myocardial infarction: Secondary | ICD-10-CM | POA: Insufficient documentation

## 2012-06-06 ENCOUNTER — Encounter (HOSPITAL_COMMUNITY)
Admission: RE | Admit: 2012-06-06 | Discharge: 2012-06-06 | Disposition: A | Discharge status: Home / Self Care | Payer: Medicare Other | Source: Ambulatory Visit | Attending: Interventional Cardiology | Admitting: Interventional Cardiology

## 2012-06-06 ENCOUNTER — Other Ambulatory Visit: Payer: Medicare Other

## 2012-06-09 ENCOUNTER — Encounter (HOSPITAL_COMMUNITY)
Admission: RE | Admit: 2012-06-09 | Discharge: 2012-06-09 | Disposition: A | Discharge status: Home / Self Care | Payer: Medicare Other | Source: Ambulatory Visit | Attending: Interventional Cardiology | Admitting: Interventional Cardiology

## 2012-06-11 ENCOUNTER — Encounter (HOSPITAL_COMMUNITY)
Admission: RE | Admit: 2012-06-11 | Discharge: 2012-06-11 | Disposition: A | Discharge status: Home / Self Care | Payer: Medicare Other | Source: Ambulatory Visit | Attending: Interventional Cardiology | Admitting: Interventional Cardiology

## 2012-06-13 ENCOUNTER — Other Ambulatory Visit: Payer: Medicare Other

## 2012-06-13 ENCOUNTER — Encounter (HOSPITAL_COMMUNITY)
Admission: RE | Admit: 2012-06-13 | Discharge: 2012-06-13 | Disposition: A | Discharge status: Home / Self Care | Payer: Medicare Other | Source: Ambulatory Visit | Attending: Interventional Cardiology | Admitting: Interventional Cardiology

## 2012-06-16 ENCOUNTER — Encounter (HOSPITAL_COMMUNITY)
Admission: RE | Admit: 2012-06-16 | Discharge: 2012-06-16 | Disposition: A | Discharge status: Home / Self Care | Payer: Medicare Other | Source: Ambulatory Visit | Attending: Interventional Cardiology | Admitting: Interventional Cardiology

## 2012-06-18 ENCOUNTER — Encounter (HOSPITAL_COMMUNITY)
Admission: RE | Admit: 2012-06-18 | Discharge: 2012-06-18 | Disposition: A | Discharge status: Home / Self Care | Payer: Medicare Other | Source: Ambulatory Visit | Attending: Interventional Cardiology | Admitting: Interventional Cardiology

## 2012-06-20 ENCOUNTER — Other Ambulatory Visit: Payer: Medicare Other | Admitting: Lab

## 2012-06-20 ENCOUNTER — Encounter (HOSPITAL_COMMUNITY)
Admission: RE | Admit: 2012-06-20 | Discharge: 2012-06-20 | Disposition: A | Discharge status: Home / Self Care | Payer: Medicare Other | Source: Ambulatory Visit | Attending: Interventional Cardiology | Admitting: Interventional Cardiology

## 2012-06-23 ENCOUNTER — Other Ambulatory Visit: Payer: Self-pay | Admitting: *Deleted

## 2012-06-23 ENCOUNTER — Encounter (HOSPITAL_COMMUNITY)
Admission: RE | Admit: 2012-06-23 | Discharge: 2012-06-23 | Disposition: A | Discharge status: Home / Self Care | Payer: Medicare Other | Source: Ambulatory Visit | Attending: Interventional Cardiology | Admitting: Interventional Cardiology

## 2012-06-23 ENCOUNTER — Telehealth: Payer: Self-pay | Admitting: *Deleted

## 2012-06-23 NOTE — Telephone Encounter (Signed)
sw pt gv a lab appt on 5/16 @ 9 am.the patient is aware...td

## 2012-06-25 ENCOUNTER — Encounter (HOSPITAL_COMMUNITY): Payer: Self-pay

## 2012-06-25 ENCOUNTER — Encounter (HOSPITAL_COMMUNITY)
Admission: RE | Admit: 2012-06-25 | Discharge: 2012-06-25 | Disposition: A | Discharge status: Home / Self Care | Payer: Medicare Other | Source: Ambulatory Visit | Attending: Interventional Cardiology | Admitting: Interventional Cardiology

## 2012-06-25 NOTE — Progress Notes (Signed)
Pt graduated from cardiac rehab program today.  Medication list reconciled.  PHQ9 score-  0.  Pt has made significant lifestyle changes and should be commended for his success. Pt plans to continue exercise at the [facility].

## 2012-06-27 ENCOUNTER — Encounter (HOSPITAL_COMMUNITY): Payer: Medicare Other

## 2012-06-27 ENCOUNTER — Other Ambulatory Visit: Payer: Medicare Other

## 2012-07-04 ENCOUNTER — Other Ambulatory Visit: Payer: Medicare Other

## 2012-07-11 ENCOUNTER — Ambulatory Visit (INDEPENDENT_AMBULATORY_CARE_PROVIDER_SITE_OTHER): Payer: Medicare Other | Admitting: Family Medicine

## 2012-07-11 ENCOUNTER — Encounter: Payer: Self-pay | Admitting: Family Medicine

## 2012-07-11 VITALS — BP 118/76 | HR 75 | Temp 97.9°F | Resp 16 | Ht 67.5 in | Wt 146.0 lb

## 2012-07-11 DIAGNOSIS — F5101 Primary insomnia: Secondary | ICD-10-CM | POA: Insufficient documentation

## 2012-07-11 DIAGNOSIS — H9190 Unspecified hearing loss, unspecified ear: Secondary | ICD-10-CM | POA: Insufficient documentation

## 2012-07-11 DIAGNOSIS — Z0000 Encounter for general adult medical examination without abnormal findings: Secondary | ICD-10-CM

## 2012-07-11 DIAGNOSIS — G4700 Insomnia, unspecified: Secondary | ICD-10-CM

## 2012-07-11 DIAGNOSIS — N529 Male erectile dysfunction, unspecified: Secondary | ICD-10-CM

## 2012-07-11 LAB — POCT URINALYSIS DIPSTICK
Blood, UA: NEGATIVE
Leukocytes, UA: NEGATIVE
Nitrite, UA: NEGATIVE
Protein, UA: NEGATIVE
Urobilinogen, UA: 0.2
pH, UA: 5

## 2012-07-11 LAB — IFOBT (OCCULT BLOOD): IFOBT: NEGATIVE

## 2012-07-11 MED ORDER — ZOLPIDEM TARTRATE 10 MG PO TABS: 10.0000 mg | ORAL_TABLET | Freq: Every evening | ORAL | Status: DC | PRN

## 2012-07-11 MED ORDER — SILDENAFIL CITRATE 100 MG PO TABS: ORAL_TABLET | ORAL | Status: DC

## 2012-07-11 NOTE — Progress Notes (Signed)
Subjective:    Patient ID: Eric [name], male    DOB: Feb 21, 1941, 72 y.o.   MRN: [id number]  HPI  This 72 y.o. Native American is here for [facility] Subsequent Annual PE. This gentleman is  currently in treatment at [facility] for Lymphoma.  He also has a lipid disorder. He recently had MI w/ stent placement in Dec 2013, managed by Dr. Verdis Prime who will be seeing pt periodically. He has chronic insomnia requiring Zolpidem. Erectile Dysfunction is not responding to Viagra 50 mg per episode; pt interested in trying Cialis but this medication not covered by  His insurer. Pt has periodic visits w/ Dr. Brunilda Payor (Urology), last visitFeb 2014. Other chronic medical issues include tinnitus and hearing loss eval by ENT in past.  HCM: pt declines immunizations (Tdap, Zostavax and Pneumovax). CRS: ~ 5 years ago per pt hx.  PMHx, Soc Hx, Surg Hx and Prob List reviewed.   Review of Systems  HENT: Positive for hearing loss and tinnitus.   Psychiatric/Behavioral: Positive for sleep disturbance.  All other systems reviewed and are negative.       Objective:   Physical Exam  Nursing note and vitals reviewed. Constitutional: He is oriented to person, place, and time. Vital signs are normal. He appears well-developed and well-nourished. No distress.  HENT:  Head: Normocephalic and atraumatic.  Right Ear: External ear and ear canal normal. No mastoid tenderness. Tympanic membrane is scarred. Tympanic membrane is not erythematous and not bulging. No middle ear effusion. Decreased hearing is noted.  Left Ear: External ear and ear canal normal. No mastoid tenderness. Tympanic membrane is scarred. Tympanic membrane is not erythematous and not bulging.  No middle ear effusion. Decreased hearing is noted.  Nose: Nose normal. No mucosal edema, rhinorrhea, nasal deformity or septal deviation. Right sinus exhibits no maxillary sinus tenderness and no frontal sinus tenderness. Left sinus exhibits no  maxillary sinus tenderness and no frontal sinus tenderness.  Mouth/Throat: Uvula is midline, oropharynx is clear and moist and mucous membranes are normal. No oral lesions. No dental caries.  Eyes: Conjunctivae, EOM and lids are normal. Pupils are equal, round, and reactive to light. No scleral icterus.  Muddy sclerae. Corrective lenses.  Neck: Normal range of motion. Neck supple. No JVD present. No thyromegaly present.  Cardiovascular: Normal rate, regular rhythm, normal heart sounds and intact distal pulses.  Exam reveals no gallop and no friction rub.   No murmur heard. Pulmonary/Chest: Effort normal and breath sounds normal. No respiratory distress. He has no wheezes. He has no rales. He exhibits no tenderness.  Abdominal: Soft. Normal appearance, normal aorta and bowel sounds are normal. He exhibits no distension, no abdominal bruit, no pulsatile midline mass and no mass. There is no hepatosplenomegaly. There is no tenderness. There is no guarding and no CVA tenderness. No hernia. Hernia confirmed negative in the right inguinal area and confirmed negative in the left inguinal area.  Genitourinary: Rectum normal. Rectal exam shows no external hemorrhoid, no fissure, no mass, no tenderness and anal tone normal. Guaiac negative stool.  GU exam per Dr. Brunilda Payor.  Musculoskeletal: Normal range of motion. He exhibits no edema and no tenderness.  Lymphadenopathy:       Head (right side): No submental, no submandibular, no tonsillar, no posterior auricular and no occipital adenopathy present.       Head (left side): No submental, no submandibular, no tonsillar, no posterior auricular and no occipital adenopathy present.    He has no cervical  adenopathy.    He has no axillary adenopathy.       Right: No inguinal and no supraclavicular adenopathy present.       Left: No inguinal and no supraclavicular adenopathy present.  Neurological: He is alert and oriented to person, place, and time. He has normal  reflexes. No cranial nerve deficit. He exhibits normal muscle tone. Coordination normal.  Skin: Skin is warm and dry. No rash noted. No erythema. No pallor.  Psychiatric: He has a normal mood and affect. His behavior is normal. Judgment and thought content normal.    Results for orders placed in visit on 07/11/12  IFOBT (OCCULT BLOOD)      Result Value Range   IFOBT Negative    POCT URINALYSIS DIPSTICK      Result Value Range   Color, UA yellow     Clarity, UA clear     Glucose, UA neg     Bilirubin, UA neg     Ketones, UA neg     Spec Grav, UA 1.025     Blood, UA neg     pH, UA 5.0     Protein, UA neg     Urobilinogen, UA 0.2     Nitrite, UA neg     Leukocytes, UA Negative          Assessment & Plan:  Routine general medical examination at a health care facility   Erectile dysfunction- increase Viagra to 100 mg per episode. Pt aware of side effects.  Insomnia - Plan: zolpidem (AMBIEN) 10 MG tablet   Meds ordered this encounter  Medications  .       . zolpidem (AMBIEN) 10 MG tablet    Sig: Take 1 tablet (10 mg total) by mouth at bedtime as needed for sleep.    Dispense:  30 tablet    Refill:  3            . sildenafil (VIAGRA) 100 MG tablet    Sig: Take 1/2 to 1 tablet once a day as directed.    Dispense:  10 tablet    Refill:  3

## 2012-07-11 NOTE — Patient Instructions (Signed)
Keeping you healthy  Get these tests  Blood pressure- Have your blood pressure checked once a year by your healthcare provider.  Normal blood pressure is 120/80  Weight- Have your body mass index (BMI) calculated to screen for obesity.  BMI is a measure of body fat based on height and weight. You can also calculate your own BMI at [email or web address].  Cholesterol- Have your cholesterol checked every year.  Diabetes- Have your blood sugar checked regularly if you have high blood pressure, high cholesterol, have a family history of diabetes or if you are overweight.  Screening for Colon Cancer- Colonoscopy starting at age 72.  Screening may begin sooner depending on your family history and other health conditions. Follow up colonoscopy as directed by your Gastroenterologist.  Screening for Prostate Cancer- Both blood work (PSA) and a rectal exam help screen for Prostate Cancer.  Screening begins at age 81 with African-American men and at age 84 with Caucasian men.  Screening may begin sooner depending on your family history.  Take these medicines  Aspirin- One aspirin daily can help prevent Heart disease and Stroke.  Flu shot- Every fall.  Tetanus- Every 10 years.  Zostavax- Once after the age of 5 to prevent Shingles.  Pneumonia shot- Once after the age of 35; if you are younger than 47, ask your healthcare provider if you need a Pneumonia shot.  Take these steps  Don't smoke- If you do smoke, talk to your doctor about quitting.  For tips on how to quit, go to www.smokefree.gov or call 1-800-QUIT-NOW.  Be physically active- Exercise 5 days a week for at least 30 minutes.  If you are not already physically active start slow and gradually work up to 30 minutes of moderate physical activity.  Examples of moderate activity include walking briskly, mowing the yard, dancing, swimming, bicycling, etc.  Eat a healthy diet- Eat a variety of healthy food such as fruits, vegetables, low  fat milk, low fat cheese, yogurt, lean meant, poultry, fish, beans, tofu, etc. For more information go to www.thenutritionsource.org  Drink alcohol in moderation- Limit alcohol intake to less than two drinks a day. Never drink and drive.  Dentist- Brush and floss twice daily; visit your dentist twice a year.  Depression- Your emotional health is as important as your physical health. If you're feeling down, or losing interest in things you would normally enjoy please talk to your healthcare provider.  Eye exam- Visit your eye doctor every year.  Safe sex- If you may be exposed to a sexually transmitted infection, use a condom.  Seat belts- Seat belts can save your life; always wear one.  Smoke/Carbon Monoxide detectors- These detectors need to be installed on the appropriate level of your home.  Replace batteries at least once a year.  Skin cancer- When out in the sun, cover up and use sunscreen 15 SPF or higher.  Violence- If anyone is threatening you, please tell your healthcare provider.  Living Will/ Health care power of attorney- Speak with your healthcare provider and family.      Herpes Zoster Virus Vaccine What is this medicine? HERPES ZOSTER VIRUS VACCINE (HUR peez ZOS ter vahy ruhs vak SEEN) is a vaccine. It is used to prevent shingles in adults 72 years old and over. This vaccine is not used to treat shingles or nerve pain from shingles. This medicine may be used for other purposes; ask your health care provider or pharmacist if you have questions. What should I tell my health  care provider before I take this medicine? They need to know if you have any of these conditions: -cancer like leukemia or lymphoma -immune system problems or therapy -infection with fever -tuberculosis -an unusual or allergic reaction to vaccines, neomycin, gelatin, other medicines, foods, dyes, or preservatives -pregnant or trying to get pregnant -breast-feeding How should I use this  medicine? This vaccine is for injection under the skin. It is given by a health care professional. Talk to your pediatrician regarding the use of this medicine in children. This medicine is not approved for use in children. Overdosage: If you think you have taken too much of this medicine contact a poison control center or emergency room at once. NOTE: This medicine is only for you. Do not share this medicine with others. What if I miss a dose? This does not apply. What may interact with this medicine? Do not take this medicine with any of the following medications: -adalimumab -anakinra -etanercept -infliximab -medicines to treat cancer -medicines that suppress your immune system This medicine may also interact with the following medications: -immunoglobulins -steroid medicines like prednisone or cortisone This list may not describe all possible interactions. Give your health care provider a list of all the medicines, herbs, non-prescription drugs, or dietary supplements you use. Also tell them if you smoke, drink alcohol, or use illegal drugs. Some items may interact with your medicine. What should I watch for while using this medicine? Visit your doctor for regular check ups. This vaccine, like all vaccines, may not fully protect everyone. After receiving this vaccine it may be possible to pass chickenpox infection to others. Avoid people with immune system problems, pregnant women who have not had chickenpox, and newborns of women who have not had chickenpox. Talk to your doctor for more information. What side effects may I notice from receiving this medicine? Side effects that you should report to your doctor or health care professional as soon as possible: -allergic reactions like skin rash, itching or hives, swelling of the face, lips, or tongue -breathing problems -feeling faint or lightheaded, falls -fever, flu-like symptoms -pain, tingling, numbness in the hands or  feet -swelling of the ankles, feet, hands -unusually weak or tired Side effects that usually do not require medical attention (report to your doctor or health care professional if they continue or are bothersome): -aches or pains -chickenpox-like rash -diarrhea -headache -loss of appetite -nausea, vomiting -redness, pain, swelling at site where injected -runny nose This list may not describe all possible side effects. Call your doctor for medical advice about side effects. You may report side effects to FDA at 1-800-FDA-1088. Where should I keep my medicine? This drug is given in a hospital or clinic and will not be stored at home. NOTE: This sheet is a summary. It may not cover all possible information. If you have questions about this medicine, talk to your doctor, pharmacist, or health care provider.  2013, Elsevier/Gold Standard. (08/08/2009 5:43:50 PM)    Insomnia Insomnia means you have trouble falling or staying asleep. It affects about one person in three at different times and is usually related to stress from work, school, or personal relations. Insomnia is also a sign of depression or anxiety. Other medical problems that cause insomnia include conditions that cause pain, night leg cramps, coughing, shortness of breath, urinary problems, and fevers. Sleep apnea is an abnormal breathing pattern at night that can cause insomnia and loud snoring. Certain medications and excess intake of caffeine drinks (coffee, tea, colas) can  also interfere with normal sleep. Treatment for insomnia depends on the cause. Besides specific medical treatment, the following measures can help you relax and get better sleep. Get regular exercise every day, at least several hours before bed time. Try to get to bed at the same time every night. Take a hot bath before retiring to help you relax. Do not stay in bed if you are unable to sleep. During the daytime avoid staying in bed to watch television, eat, or  read. Reduce unwanted noise and light in your room. Keep your room at a comfortable temperature. Avoid alcohol as it causes one to sleep less soundly, may cause you to awaken during the night, and can leave you feeling groggy the next day. Using a mild sedative prescribed or suggested by your caregiver may be needed, but the daily use of sleeping pills is not recommended. Anti-depressant medicines can improve sleep in people with depression. Please call your doctor for follow up care to better understand the cause and proper treatment of your insomnia. Document Released: 03/29/2004 Document Revised: 05/14/2011 Document Reviewed: 02/19/2005 [facility] Patient Information 2013 [place], [place].

## 2012-07-18 ENCOUNTER — Other Ambulatory Visit (HOSPITAL_BASED_OUTPATIENT_CLINIC_OR_DEPARTMENT_OTHER): Payer: Medicare Other | Admitting: Lab

## 2012-07-18 ENCOUNTER — Ambulatory Visit (HOSPITAL_COMMUNITY)
Admission: RE | Admit: 2012-07-18 | Discharge: 2012-07-18 | Disposition: A | Discharge status: Home / Self Care | Payer: Medicare Other | Source: Ambulatory Visit | Attending: Physician Assistant | Admitting: Physician Assistant

## 2012-07-18 ENCOUNTER — Other Ambulatory Visit: Payer: Medicare Other

## 2012-07-18 ENCOUNTER — Encounter (HOSPITAL_COMMUNITY): Payer: Self-pay

## 2012-07-18 DIAGNOSIS — K449 Diaphragmatic hernia without obstruction or gangrene: Secondary | ICD-10-CM | POA: Insufficient documentation

## 2012-07-18 DIAGNOSIS — R599 Enlarged lymph nodes, unspecified: Secondary | ICD-10-CM | POA: Insufficient documentation

## 2012-07-18 DIAGNOSIS — C8589 Other specified types of non-Hodgkin lymphoma, extranodal and solid organ sites: Secondary | ICD-10-CM

## 2012-07-18 DIAGNOSIS — K5730 Diverticulosis of large intestine without perforation or abscess without bleeding: Secondary | ICD-10-CM | POA: Insufficient documentation

## 2012-07-18 DIAGNOSIS — C8590 Non-Hodgkin lymphoma, unspecified, unspecified site: Secondary | ICD-10-CM

## 2012-07-18 DIAGNOSIS — N401 Enlarged prostate with lower urinary tract symptoms: Secondary | ICD-10-CM | POA: Insufficient documentation

## 2012-07-18 DIAGNOSIS — N281 Cyst of kidney, acquired: Secondary | ICD-10-CM | POA: Insufficient documentation

## 2012-07-18 DIAGNOSIS — I2510 Atherosclerotic heart disease of native coronary artery without angina pectoris: Secondary | ICD-10-CM | POA: Insufficient documentation

## 2012-07-18 DIAGNOSIS — N138 Other obstructive and reflux uropathy: Secondary | ICD-10-CM | POA: Insufficient documentation

## 2012-07-18 DIAGNOSIS — R9389 Abnormal findings on diagnostic imaging of other specified body structures: Secondary | ICD-10-CM | POA: Insufficient documentation

## 2012-07-18 LAB — LACTATE DEHYDROGENASE (CC13): LDH: 207 U/L (ref 125–245)

## 2012-07-18 LAB — CBC WITH DIFFERENTIAL/PLATELET
BASO%: 0.9 % (ref 0.0–2.0)
Basophils Absolute: 0 10*3/uL (ref 0.0–0.1)
EOS%: 9.4 % — ABNORMAL HIGH (ref 0.0–7.0)
MCH: 31.3 pg (ref 27.2–33.4)
MCHC: 34.2 g/dL (ref 32.0–36.0)
MCV: 91.5 fL (ref 79.3–98.0)
MONO%: 16.8 % — ABNORMAL HIGH (ref 0.0–14.0)
RBC: 4.55 10*6/uL (ref 4.20–5.82)
RDW: 13.9 % (ref 11.0–14.6)

## 2012-07-18 LAB — COMPREHENSIVE METABOLIC PANEL (CC13)
AST: 27 U/L (ref 5–34)
Alkaline Phosphatase: 120 U/L (ref 40–150)
Glucose: 87 mg/dl (ref 70–99)
Sodium: 141 mEq/L (ref 136–145)
Total Bilirubin: 0.62 mg/dL (ref 0.20–1.20)
Total Protein: 6.6 g/dL (ref 6.4–8.3)

## 2012-07-18 MED ORDER — IOHEXOL 300 MG/ML  SOLN
100.0000 mL | Freq: Once | INTRAMUSCULAR | Status: AC | PRN
  Administered 2012-07-18: 100 mL via INTRAVENOUS

## 2012-07-23 ENCOUNTER — Other Ambulatory Visit: Payer: Medicare Other | Admitting: Lab

## 2012-07-23 ENCOUNTER — Ambulatory Visit: Payer: Medicare Other | Admitting: Internal Medicine

## 2012-07-23 ENCOUNTER — Other Ambulatory Visit: Payer: Self-pay | Admitting: *Deleted

## 2012-07-23 ENCOUNTER — Ambulatory Visit: Payer: Medicare Other

## 2012-07-24 ENCOUNTER — Ambulatory Visit: Payer: Medicare Other

## 2012-07-24 ENCOUNTER — Telehealth: Payer: Self-pay | Admitting: Internal Medicine

## 2012-07-24 NOTE — Telephone Encounter (Signed)
Gave pt Appt for lab and chemo for 6/5 pt FTKA 5/21

## 2012-08-01 ENCOUNTER — Telehealth: Payer: Self-pay | Admitting: Internal Medicine

## 2012-08-01 NOTE — Telephone Encounter (Signed)
Mailed pt appt ,for next week appt, gave to [place] Cerise to mail

## 2012-08-06 ENCOUNTER — Ambulatory Visit (HOSPITAL_BASED_OUTPATIENT_CLINIC_OR_DEPARTMENT_OTHER): Payer: Medicare Other | Admitting: Internal Medicine

## 2012-08-06 ENCOUNTER — Encounter: Payer: Self-pay | Admitting: Internal Medicine

## 2012-08-06 ENCOUNTER — Telehealth: Payer: Self-pay | Admitting: Internal Medicine

## 2012-08-06 ENCOUNTER — Other Ambulatory Visit (HOSPITAL_BASED_OUTPATIENT_CLINIC_OR_DEPARTMENT_OTHER): Payer: Medicare Other | Admitting: Lab

## 2012-08-06 ENCOUNTER — Telehealth: Payer: Self-pay | Admitting: *Deleted

## 2012-08-06 VITALS — BP 123/75 | HR 66 | Temp 98.8°F | Resp 18 | Ht 67.0 in | Wt 144.8 lb

## 2012-08-06 DIAGNOSIS — C8589 Other specified types of non-Hodgkin lymphoma, extranodal and solid organ sites: Secondary | ICD-10-CM

## 2012-08-06 DIAGNOSIS — C8590 Non-Hodgkin lymphoma, unspecified, unspecified site: Secondary | ICD-10-CM

## 2012-08-06 LAB — COMPREHENSIVE METABOLIC PANEL (CC13)
AST: 22 U/L (ref 5–34)
BUN: 13.2 mg/dL (ref 7.0–26.0)
CO2: 24 mEq/L (ref 22–29)
Calcium: 9.4 mg/dL (ref 8.4–10.4)
Chloride: 106 mEq/L (ref 98–107)
Creatinine: 1 mg/dL (ref 0.7–1.3)
Total Bilirubin: 0.56 mg/dL (ref 0.20–1.20)

## 2012-08-06 LAB — CBC WITH DIFFERENTIAL/PLATELET
Basophils Absolute: 0 10*3/uL (ref 0.0–0.1)
Eosinophils Absolute: 0 10*3/uL (ref 0.0–0.5)
LYMPH%: 29 % (ref 14.0–49.0)
MCV: 92.2 fL (ref 79.3–98.0)
MONO%: 10 % (ref 0.0–14.0)
NEUT#: 3 10*3/uL (ref 1.5–6.5)
Platelets: 187 10*3/uL (ref 140–400)
RBC: 4.44 10*6/uL (ref 4.20–5.82)

## 2012-08-06 LAB — LACTATE DEHYDROGENASE (CC13): LDH: 205 U/L (ref 125–245)

## 2012-08-06 NOTE — Progress Notes (Signed)
[facility] Telephone:([phone]   [phone]  OFFICE PROGRESS NOTE  Eric Due, MD [address] [place] [address]  PRINCIPAL DIAGNOSIS: Bulky stage IV low grade lymphoma diagnosed in November 2009.   PRIOR THERAPY:  1. Status post 7 cycles of systemic chemotherapy with CHOP/Rituxan. Last dose was given May 2010. 2. Status post 6 cycles of maintenance Rituxan 375 mg/sq m given every 2 months. Last dose was given on May 29, 2010, and the patient was lost to followup at that time. He received another dose on 12/11/2010, then again missed 2 doses. 3. systemic chemotherapy with Rituxan 375 mg/M2 on day 1 and that bendamustine 90 mg/M2 on days 1 and 2 every 4 weeks, status post 6 cycles.  CURRENT THERAPY: Maintenance Rituxan 375 mg/M2 every 2 months, status post 3 cycles  INTERVAL HISTORY: Eric [name] 72 y.o. male returns to the clinic today for followup visit accompanied by his wife. The patient is feeling fine today with no specific complaints. He denied having any significant chest pain, shortness breath, cough or hemoptysis. He lost a few pounds recently but this most likely secondary to progressive daily exercise at the patient started doing over the last few months. He denied having any significant night sweats. He had repeat CT scan of the chest, abdomen and pelvis performed recently and he is here for evaluation and discussion of his scan results. He is tolerating his treatment with maintenance Rituxan fairly well.  MEDICAL HISTORY: Past Medical History  Diagnosis Date  . Cancer   . Non Hodgkin's lymphoma   . History of heart attack     ALLERGIES:  is allergic to neosporin.  MEDICATIONS:  Current Outpatient Prescriptions  Medication Sig Dispense Refill  . aspirin EC 325 MG EC tablet Take 1 tablet (325 mg total) by mouth daily.  30 tablet    . atorvastatin (LIPITOR) 40 MG tablet Take 1 tablet (40 mg total) by mouth daily at 6  PM.  30 tablet  11  . cyanocobalamin 1000 MCG tablet Take 100 mcg by mouth daily.      . diphenhydrAMINE (BENADRYL) 25 MG tablet Take 25 mg by mouth at bedtime as needed. Sleep      . ferrous sulfate 325 (65 FE) MG tablet Take 325 mg by mouth daily with breakfast.      . nitroGLYCERIN (NITROSTAT) 0.4 MG SL tablet Place 1 tablet (0.4 mg total) under the tongue every 5 (five) minutes x 3 doses as needed for chest pain.  25 tablet  3  . prasugrel (EFFIENT) 10 MG TABS Take 1 tablet (10 mg total) by mouth daily.  30 tablet  11  . sildenafil (VIAGRA) 100 MG tablet Take 1/2 to 1 tablet once a day as directed.  10 tablet  3  . zolpidem (AMBIEN) 10 MG tablet Take 1 tablet (10 mg total) by mouth at bedtime as needed for sleep.  30 tablet  3   No current facility-administered medications for this visit.    SURGICAL HISTORY:  Past Surgical History  Procedure Laterality Date  . Coronary stent placement      REVIEW OF SYSTEMS:  A comprehensive review of systems was negative except for: Constitutional: positive for weight loss   PHYSICAL EXAMINATION: General appearance: alert, cooperative and no distress Head: Normocephalic, without obvious abnormality, atraumatic Neck: no adenopathy Lymph nodes: Cervical, supraclavicular, and axillary nodes normal. Resp: clear to auscultation bilaterally Cardio: regular rate and rhythm, S1,  S2 normal, no murmur, click, rub or gallop GI: soft, non-tender; bowel sounds normal; no masses,  no organomegaly Extremities: extremities normal, atraumatic, no cyanosis or edema Neurologic: Alert and oriented X 3, normal strength and tone. Normal symmetric reflexes. Normal coordination and gait  ECOG PERFORMANCE STATUS: 0 - Asymptomatic  Blood pressure 123/75, pulse 66, temperature 98.8 F (37.1 C), temperature source Oral, resp. rate 18, height 5\' 7"  (1.702 m), weight 144 lb 12.8 oz (65.681 kg), SpO2 100.00%.  LABORATORY DATA: Lab Results  Component Value Date   WBC 5.0  08/06/2012   HGB 14.0 08/06/2012   HCT 40.9 08/06/2012   MCV 92.2 08/06/2012   PLT 187 08/06/2012      Chemistry      Component Value Date/Time   NA 141 07/18/2012 0915   NA 142 03/05/2012 0545   NA 141 03/23/2010 1301   K 4.2 07/18/2012 0915   K 3.3* 03/05/2012 0545   K 4.7 03/23/2010 1301   CL 104 07/18/2012 0915   CL 107 03/05/2012 0545   CL 99 03/23/2010 1301   CO2 27 07/18/2012 0915   CO2 23 03/05/2012 0545   CO2 26 03/23/2010 1301   BUN 15.2 07/18/2012 0915   BUN 9 03/05/2012 0545   BUN 12 03/23/2010 1301   CREATININE 1.1 07/18/2012 0915   CREATININE 0.92 03/05/2012 0545   CREATININE 1.0 03/23/2010 1301      Component Value Date/Time   CALCIUM 9.2 07/18/2012 0915   CALCIUM 8.5 03/05/2012 0545   CALCIUM 9.1 03/23/2010 1301   ALKPHOS 120 07/18/2012 0915   ALKPHOS 86 03/03/2012 0030   ALKPHOS 78 03/23/2010 1301   AST 27 07/18/2012 0915   AST 97* 03/03/2012 0030   AST 25 03/23/2010 1301   ALT 28 07/18/2012 0915   ALT 25 03/03/2012 0030   BILITOT 0.62 07/18/2012 0915   BILITOT 0.2* 03/03/2012 0030   BILITOT 0.60 03/23/2010 1301       RADIOGRAPHIC STUDIES: Ct Chest W Contrast  07/18/2012   *RADIOLOGY REPORT*  Clinical Data:  990 kV non-Hodgkin's lymphoma  CT CHEST, ABDOMEN AND PELVIS WITH CONTRAST  Technique:  Multidetector CT imaging of the chest, abdomen and pelvis was performed following the standard protocol during bolus administration of intravenous contrast.  Contrast: OMNIPAQUE IOHEXOL 300 MG/ML  SOLN  Comparison:   PET CT from 11/23/2011.  CT imaging from 08/30/2011.   CT CHEST  Findings:  Surgical clips are seen in the right axilla.  Small lymph nodes in the left axilla are stable.  No mediastinal lymphadenopathy.  12 mm short-axis right hilar lymph node is unchanged.  Heart size is normal. Coronary artery calcification is noted.  There is no pericardial or pleural effusion. Small hiatal hernia noted.  Lungs are clear bilaterally. No parenchymal nodule or focal airspace consolidation.  Bone  windows reveal no worrisome lytic or sclerotic osseous lesions.  IMPRESSION: Stable.  No new or progressive disease in the chest.  The previously described borderline enlarged right hilar lymph node is unchanged.  The    CT ABDOMEN AND PELVIS  Findings:  The liver, spleen, stomach, duodenum, pancreas, gallbladder, and adrenal glands are normal.  Cortical scarring is visible in both kidneys.  4.3 cm cyst identified in the interpolar right kidney.  Central sinus cysts are noted in the left kidney.  No abdominal aortic aneurysm.  Small lymph node evident within the gastrohepatic ligament.  No abdominal lymphadenopathy.  No free fluid in the abdomen.  Imaging through  the pelvis shows no free intraperitoneal fluid.  No pelvic sidewall lymphadenopathy.  The prostate gland is markedly enlarged and heterogeneous.  Prominent diverticular changes are seen in the left colon without diverticulitis.  The terminal ileum and the appendix are normal.  Bone windows reveal no worrisome lytic or sclerotic osseous lesions.  IMPRESSION:  No lymphadenopathy in the pelvis.  Lymph nodes along the left pelvic sidewall continue to decrease in size in the interval.  Colonic diverticulosis without diverticulitis.  Marked prostatomegaly with associated bladder wall thickening, likely secondary to a component of bladder outlet obstruction.   Original Report Authenticated By: Kennith Center, M.D.   ASSESSMENT: this is a very pleasant 72 years old African American male with bulky stage IV low-grade lymphoma status post systemic chemotherapy with bendamustine and rituximab currently on maintenance Rituxan status post 3 cycles   PLAN: the patient is tolerating his maintenance treatment with Rituxan fairly well. He has no evidence for disease progression on his recent scan. I discussed the scan results with the patient and his wife. I recommended for him to continue his maintenance Rituxan tomorrow as scheduled. He would come back for follow up  visit in 2 months for evaluation before the next cycle of his maintenance chemotherapy. He was advised to call immediately if he has any concerning symptoms in the interval.  All questions were answered. The patient knows to call the clinic with any problems, questions or concerns. We can certainly see the patient much sooner if necessary.  I spent 15 minutes counseling the patient face to face. The total time spent in the appointment was 25 minutes.

## 2012-08-06 NOTE — Telephone Encounter (Signed)
Per staff message and POF I have scheduled appts.  JMW  

## 2012-08-06 NOTE — Patient Instructions (Signed)
Your scan showed no evidence for disease progression. Followup visit in 2 months with the next cycle of Rituxan.

## 2012-08-07 ENCOUNTER — Ambulatory Visit (HOSPITAL_BASED_OUTPATIENT_CLINIC_OR_DEPARTMENT_OTHER): Payer: Medicare Other

## 2012-08-07 ENCOUNTER — Ambulatory Visit: Payer: Medicare Other

## 2012-08-07 VITALS — BP 109/64 | HR 76 | Temp 97.1°F | Resp 20

## 2012-08-07 DIAGNOSIS — Z5112 Encounter for antineoplastic immunotherapy: Secondary | ICD-10-CM

## 2012-08-07 DIAGNOSIS — C8589 Other specified types of non-Hodgkin lymphoma, extranodal and solid organ sites: Secondary | ICD-10-CM

## 2012-08-07 DIAGNOSIS — C8590 Non-Hodgkin lymphoma, unspecified, unspecified site: Secondary | ICD-10-CM

## 2012-08-07 MED ORDER — SODIUM CHLORIDE 0.9 % IV SOLN
375.0000 mg/m2 | Freq: Once | INTRAVENOUS | Status: AC
  Administered 2012-08-07: 700 mg via INTRAVENOUS
  Filled 2012-08-07: qty 70

## 2012-08-07 MED ORDER — SODIUM CHLORIDE 0.9 % IV SOLN
Freq: Once | INTRAVENOUS | Status: AC
  Administered 2012-08-07: 09:00:00 via INTRAVENOUS

## 2012-08-07 MED ORDER — ACETAMINOPHEN 325 MG PO TABS
650.0000 mg | ORAL_TABLET | Freq: Once | ORAL | Status: AC
  Administered 2012-08-07: 650 mg via ORAL

## 2012-08-07 MED ORDER — DIPHENHYDRAMINE HCL 25 MG PO CAPS
50.0000 mg | ORAL_CAPSULE | Freq: Once | ORAL | Status: AC
  Administered 2012-08-07: 50 mg via ORAL

## 2012-08-07 NOTE — Patient Instructions (Signed)
Patient aware of next appointment; discharged home with wife and no complaints.

## 2012-08-08 ENCOUNTER — Ambulatory Visit: Payer: Medicare Other

## 2012-09-30 ENCOUNTER — Telehealth: Payer: Self-pay | Admitting: *Deleted

## 2012-09-30 NOTE — Telephone Encounter (Signed)
Patient called regarding his appts in August. I have called and gave the patient the date and times

## 2012-09-30 NOTE — Telephone Encounter (Signed)
Pt called stating that his insurance has not paid for his last 2 labs that have been drawn because an albumin has not been done.  According to pt's records, a CMET has been drawn with each visit.  Called and spoke with Lenise in financial counseling.  She stated he needs to call the billing dept [employer] [phone].  Called and informed pt.  He verbalized understanding.  SLJ

## 2012-10-06 ENCOUNTER — Telehealth: Payer: Self-pay | Admitting: Internal Medicine

## 2012-10-06 ENCOUNTER — Ambulatory Visit (HOSPITAL_BASED_OUTPATIENT_CLINIC_OR_DEPARTMENT_OTHER): Payer: Medicare Other

## 2012-10-06 ENCOUNTER — Other Ambulatory Visit (HOSPITAL_BASED_OUTPATIENT_CLINIC_OR_DEPARTMENT_OTHER): Payer: Medicare Other | Admitting: Lab

## 2012-10-06 ENCOUNTER — Ambulatory Visit (HOSPITAL_BASED_OUTPATIENT_CLINIC_OR_DEPARTMENT_OTHER): Payer: Medicare Other | Admitting: Internal Medicine

## 2012-10-06 ENCOUNTER — Ambulatory Visit: Payer: Medicare Other | Admitting: Internal Medicine

## 2012-10-06 ENCOUNTER — Encounter: Payer: Self-pay | Admitting: Internal Medicine

## 2012-10-06 VITALS — BP 124/74 | HR 73 | Temp 97.6°F | Resp 18

## 2012-10-06 VITALS — BP 119/78 | HR 69 | Temp 97.0°F | Resp 18 | Ht 67.0 in | Wt 146.1 lb

## 2012-10-06 DIAGNOSIS — C8590 Non-Hodgkin lymphoma, unspecified, unspecified site: Secondary | ICD-10-CM

## 2012-10-06 DIAGNOSIS — Z5112 Encounter for antineoplastic immunotherapy: Secondary | ICD-10-CM

## 2012-10-06 DIAGNOSIS — C8589 Other specified types of non-Hodgkin lymphoma, extranodal and solid organ sites: Secondary | ICD-10-CM

## 2012-10-06 LAB — CBC WITH DIFFERENTIAL/PLATELET
Eosinophils Absolute: 0.3 10*3/uL (ref 0.0–0.5)
HCT: 41 % (ref 38.4–49.9)
LYMPH%: 29.9 % (ref 14.0–49.0)
MCHC: 34.1 g/dL (ref 32.0–36.0)
MCV: 91.7 fL (ref 79.3–98.0)
MONO%: 10.2 % (ref 0.0–14.0)
NEUT#: 2.8 10*3/uL (ref 1.5–6.5)
NEUT%: 53.8 % (ref 39.0–75.0)
Platelets: 187 10*3/uL (ref 140–400)
RBC: 4.47 10*6/uL (ref 4.20–5.82)
nRBC: 0 % (ref 0–0)

## 2012-10-06 LAB — COMPREHENSIVE METABOLIC PANEL (CC13)
AST: 23 U/L (ref 5–34)
Albumin: 3.7 g/dL (ref 3.5–5.0)
BUN: 13.5 mg/dL (ref 7.0–26.0)
CO2: 24 mEq/L (ref 22–29)
Calcium: 9.4 mg/dL (ref 8.4–10.4)
Chloride: 107 mEq/L (ref 98–109)
Creatinine: 1 mg/dL (ref 0.7–1.3)
Glucose: 113 mg/dl (ref 70–140)

## 2012-10-06 MED ORDER — ACETAMINOPHEN 325 MG PO TABS
650.0000 mg | ORAL_TABLET | Freq: Once | ORAL | Status: AC
  Administered 2012-10-06: 650 mg via ORAL

## 2012-10-06 MED ORDER — SODIUM CHLORIDE 0.9 % IV SOLN
375.0000 mg/m2 | Freq: Once | INTRAVENOUS | Status: AC
  Administered 2012-10-06: 700 mg via INTRAVENOUS
  Filled 2012-10-06: qty 70

## 2012-10-06 MED ORDER — SODIUM CHLORIDE 0.9 % IV SOLN
Freq: Once | INTRAVENOUS | Status: AC
  Administered 2012-10-06: 12:00:00 via INTRAVENOUS

## 2012-10-06 MED ORDER — DIPHENHYDRAMINE HCL 25 MG PO CAPS
50.0000 mg | ORAL_CAPSULE | Freq: Once | ORAL | Status: AC
  Administered 2012-10-06: 50 mg via ORAL

## 2012-10-06 NOTE — Telephone Encounter (Signed)
, °

## 2012-10-06 NOTE — Progress Notes (Signed)
[facility] [phone]   [phone]  OFFICE PROGRESS NOTE  Tally Due, MD [address] [place] [address]  PRINCIPAL DIAGNOSIS: Bulky stage IV low grade lymphoma diagnosed in November 2009.   PRIOR THERAPY:  1. Status post 7 cycles of systemic chemotherapy with CHOP/Rituxan. Last dose was given May 2010. 2. Status post 6 cycles of maintenance Rituxan 375 mg/sq m given every 2 months. Last dose was given on May 29, 2010, and the patient was lost to followup at that time. He received another dose on 12/11/2010, then again missed 2 doses. 3. systemic chemotherapy with Rituxan 375 mg/M2 on day 1 and that bendamustine 90 mg/M2 on days 1 and 2 every 4 weeks, status post 6 cycles.  CURRENT THERAPY: Maintenance Rituxan 375 mg/M2 every 2 months, status post 4 cycles  INTERVAL HISTORY: Eric [name] 72 y.o. male returns to the clinic today for followup visit. The patient is feeling fine today with no specific complaints. He denied having any significant chest pain, shortness breath, cough or hemoptysis. He denied having any weight loss or night sweats. He has no peripheral lymphadenopathy. He is tolerating his treatment with maintenance rituximab fairly well with no significant adverse effects.   MEDICAL HISTORY: Past Medical History  Diagnosis Date  . Cancer   . Non Hodgkin's lymphoma   . History of heart attack     ALLERGIES:  is allergic to neosporin.  MEDICATIONS:  Current Outpatient Prescriptions  Medication Sig Dispense Refill  . aspirin EC 325 MG EC tablet Take 1 tablet (325 mg total) by mouth daily.  30 tablet    . atorvastatin (LIPITOR) 40 MG tablet Take 1 tablet (40 mg total) by mouth daily at 6 PM.  30 tablet  11  . cyanocobalamin 1000 MCG tablet Take 100 mcg by mouth daily.      . diphenhydrAMINE (BENADRYL) 25 MG tablet Take 25 mg by mouth at bedtime as needed. Sleep      . ferrous sulfate 325 (65 FE) MG tablet Take  325 mg by mouth daily with breakfast.      . nitroGLYCERIN (NITROSTAT) 0.4 MG SL tablet Place 1 tablet (0.4 mg total) under the tongue every 5 (five) minutes x 3 doses as needed for chest pain.  25 tablet  3  . prasugrel (EFFIENT) 10 MG TABS Take 1 tablet (10 mg total) by mouth daily.  30 tablet  11  . sildenafil (VIAGRA) 100 MG tablet Take 1/2 to 1 tablet once a day as directed.  10 tablet  3  . zolpidem (AMBIEN) 10 MG tablet Take 1 tablet (10 mg total) by mouth at bedtime as needed for sleep.  30 tablet  3   No current facility-administered medications for this visit.    SURGICAL HISTORY:  Past Surgical History  Procedure Laterality Date  . Coronary stent placement      REVIEW OF SYSTEMS:  A comprehensive review of systems was negative.   PHYSICAL EXAMINATION: General appearance: alert, cooperative and no distress Head: Normocephalic, without obvious abnormality, atraumatic Neck: no adenopathy Lymph nodes: Cervical, supraclavicular, and axillary nodes normal. Resp: clear to auscultation bilaterally Cardio: regular rate and rhythm, S1, S2 normal, no murmur, click, rub or gallop GI: soft, non-tender; bowel sounds normal; no masses,  no organomegaly Extremities: extremities normal, atraumatic, no cyanosis or edema  ECOG PERFORMANCE STATUS: 0 - Asymptomatic  Blood pressure 119/78, pulse 69, temperature 97 F (36.1 C), temperature source Oral,  resp. rate 18, height 5\' 7"  (1.702 m), weight 146 lb 1.6 oz (66.271 kg).  LABORATORY DATA: Lab Results  Component Value Date   WBC 5.1 10/06/2012   HGB 14.0 10/06/2012   HCT 41.0 10/06/2012   MCV 91.7 10/06/2012   PLT 187 10/06/2012      Chemistry      Component Value Date/Time   NA 140 08/06/2012 1244   NA 142 03/05/2012 0545   NA 141 03/23/2010 1301   K 4.0 08/06/2012 1244   K 3.3* 03/05/2012 0545   K 4.7 03/23/2010 1301   CL 106 08/06/2012 1244   CL 107 03/05/2012 0545   CL 99 03/23/2010 1301   CO2 24 08/06/2012 1244   CO2 23 03/05/2012 0545   CO2 26  03/23/2010 1301   BUN 13.2 08/06/2012 1244   BUN 9 03/05/2012 0545   BUN 12 03/23/2010 1301   CREATININE 1.0 08/06/2012 1244   CREATININE 0.92 03/05/2012 0545   CREATININE 1.0 03/23/2010 1301      Component Value Date/Time   CALCIUM 9.4 08/06/2012 1244   CALCIUM 8.5 03/05/2012 0545   CALCIUM 9.1 03/23/2010 1301   ALKPHOS 106 08/06/2012 1244   ALKPHOS 86 03/03/2012 0030   ALKPHOS 78 03/23/2010 1301   AST 22 08/06/2012 1244   AST 97* 03/03/2012 0030   AST 25 03/23/2010 1301   ALT 15 08/06/2012 1244   ALT 25 03/03/2012 0030   ALT 20 03/23/2010 1301   BILITOT 0.56 08/06/2012 1244   BILITOT 0.2* 03/03/2012 0030   BILITOT 0.60 03/23/2010 1301       RADIOGRAPHIC STUDIES: No results found.  ASSESSMENT AND PLAN: This is a very pleasant 72 years old male with history of bulky stage IV low-grade lymphoma currently on maintenance Rituxan status post 4 cycles. He is tolerating his treatment fairly well with no significant adverse effects. We will proceed with cycle #5 today as scheduled. The patient would come back for followup visit in 2 months with the start of cycle #6. He was advised to call immediately if he has any concerning symptoms in the interval. The patient voices understanding of current disease status and treatment options and is in agreement with the current care plan.  All questions were answered. The patient knows to call the clinic with any problems, questions or concerns. We can certainly see the patient much sooner if necessary.

## 2012-10-06 NOTE — Patient Instructions (Addendum)
[facility] Discharge Instructions for Patients Receiving Chemotherapy  Today you received the following chemotherapy agents: rituxan  To help prevent nausea and vomiting after your treatment, we encourage you to take your nausea medication.  Take it as often as prescribed.     If you develop nausea and vomiting that is not controlled by your nausea medication, call the clinic. If it is after clinic hours your family physician or the after hours number for the clinic or go to the Emergency Department.   BELOW ARE SYMPTOMS THAT SHOULD BE REPORTED IMMEDIATELY:  *FEVER GREATER THAN 100.5 F  *CHILLS WITH OR WITHOUT FEVER  NAUSEA AND VOMITING THAT IS NOT CONTROLLED WITH YOUR NAUSEA MEDICATION  *UNUSUAL SHORTNESS OF BREATH  *UNUSUAL BRUISING OR BLEEDING  TENDERNESS IN MOUTH AND THROAT WITH OR WITHOUT PRESENCE OF ULCERS  *URINARY PROBLEMS  *BOWEL PROBLEMS  UNUSUAL RASH Items with * indicate a potential emergency and should be followed up as soon as possible.  Feel free to call the clinic you have any questions or concerns. The clinic phone number is [phone].   I have been informed and understand all the instructions given to me. I know to contact the clinic, my physician, or go to the Emergency Department if any problems should occur. I do not have any questions at this time, but understand that I may call the clinic during office hours   should I have any questions or need assistance in obtaining follow up care.    __________________________________________  _____________  __________ Signature of Patient or Authorized Representative            Date                   Time    __________________________________________ Nurse's Signature    

## 2012-10-06 NOTE — Patient Instructions (Signed)
Continue treatment with maintenance Rituxan.  Followup visit in 2 months.

## 2012-10-27 ENCOUNTER — Telehealth: Payer: Self-pay

## 2012-10-27 NOTE — Telephone Encounter (Signed)
Pharm called and stated pt is asking for change from Viagra 100 mg to the Viagra 20 mg because it comes in a generic and will be less expensive. Pleas advise.

## 2012-10-28 MED ORDER — SILDENAFIL CITRATE 25 MG PO TABS: ORAL_TABLET | ORAL | Status: DC

## 2012-10-28 NOTE — Telephone Encounter (Signed)
I phoned generic Viagra 25 mg tablets to pt's pharmacy (#30 w/ 1 refill).

## 2012-12-05 ENCOUNTER — Ambulatory Visit (HOSPITAL_BASED_OUTPATIENT_CLINIC_OR_DEPARTMENT_OTHER): Payer: Medicare Other

## 2012-12-05 ENCOUNTER — Ambulatory Visit (HOSPITAL_BASED_OUTPATIENT_CLINIC_OR_DEPARTMENT_OTHER): Payer: Medicare Other | Admitting: Physician Assistant

## 2012-12-05 ENCOUNTER — Telehealth: Payer: Self-pay | Admitting: Internal Medicine

## 2012-12-05 ENCOUNTER — Encounter: Payer: Self-pay | Admitting: Physician Assistant

## 2012-12-05 ENCOUNTER — Other Ambulatory Visit (HOSPITAL_BASED_OUTPATIENT_CLINIC_OR_DEPARTMENT_OTHER): Payer: Medicare Other | Admitting: Lab

## 2012-12-05 VITALS — BP 129/69 | HR 70 | Temp 98.2°F | Resp 18 | Ht 67.0 in | Wt 147.7 lb

## 2012-12-05 VITALS — BP 135/83 | HR 51 | Temp 97.0°F | Resp 18

## 2012-12-05 DIAGNOSIS — C8590 Non-Hodgkin lymphoma, unspecified, unspecified site: Secondary | ICD-10-CM

## 2012-12-05 DIAGNOSIS — C8588 Other specified types of non-Hodgkin lymphoma, lymph nodes of multiple sites: Secondary | ICD-10-CM

## 2012-12-05 DIAGNOSIS — Z5112 Encounter for antineoplastic immunotherapy: Secondary | ICD-10-CM

## 2012-12-05 LAB — CBC WITH DIFFERENTIAL/PLATELET
BASO%: 0.4 % (ref 0.0–2.0)
EOS%: 8 % — ABNORMAL HIGH (ref 0.0–7.0)
MCH: 31.6 pg (ref 27.2–33.4)
MCHC: 34.3 g/dL (ref 32.0–36.0)
MONO#: 0.5 10*3/uL (ref 0.1–0.9)
RBC: 4.18 10*6/uL — ABNORMAL LOW (ref 4.20–5.82)
RDW: 13.4 % (ref 11.0–14.6)
WBC: 4.9 10*3/uL (ref 4.0–10.3)
lymph#: 1.4 10*3/uL (ref 0.9–3.3)
nRBC: 0 % (ref 0–0)

## 2012-12-05 LAB — COMPREHENSIVE METABOLIC PANEL (CC13)
ALT: 23 U/L (ref 0–55)
AST: 26 U/L (ref 5–34)
Albumin: 3.7 g/dL (ref 3.5–5.0)
Alkaline Phosphatase: 129 U/L (ref 40–150)
Calcium: 10 mg/dL (ref 8.4–10.4)
Chloride: 107 mEq/L (ref 98–109)
Creatinine: 0.9 mg/dL (ref 0.7–1.3)
Potassium: 4.4 mEq/L (ref 3.5–5.1)

## 2012-12-05 MED ORDER — ACETAMINOPHEN 325 MG PO TABS
650.0000 mg | ORAL_TABLET | Freq: Once | ORAL | Status: AC
  Administered 2012-12-05: 650 mg via ORAL

## 2012-12-05 MED ORDER — SODIUM CHLORIDE 0.9 % IV SOLN
Freq: Once | INTRAVENOUS | Status: AC
  Administered 2012-12-05: 12:00:00 via INTRAVENOUS

## 2012-12-05 MED ORDER — DIPHENHYDRAMINE HCL 25 MG PO CAPS
ORAL_CAPSULE | ORAL | Status: AC
  Filled 2012-12-05: qty 2

## 2012-12-05 MED ORDER — ACETAMINOPHEN 325 MG PO TABS
ORAL_TABLET | ORAL | Status: AC
  Filled 2012-12-05: qty 2

## 2012-12-05 MED ORDER — DIPHENHYDRAMINE HCL 25 MG PO CAPS
50.0000 mg | ORAL_CAPSULE | Freq: Once | ORAL | Status: AC
  Administered 2012-12-05: 50 mg via ORAL

## 2012-12-05 MED ORDER — SODIUM CHLORIDE 0.9 % IV SOLN
375.0000 mg/m2 | Freq: Once | INTRAVENOUS | Status: AC
  Administered 2012-12-05: 700 mg via INTRAVENOUS
  Filled 2012-12-05: qty 70

## 2012-12-05 NOTE — Patient Instructions (Signed)
[facility] Discharge Instructions for Patients Receiving Chemotherapy  Today you received the following chemotherapy agents Rituxan.  To help prevent nausea and vomiting after your treatment, we encourage you to take your nausea medication (none ordered--call MD office if needed)   If you develop nausea and vomiting that is not controlled by your nausea medication, call the clinic.   BELOW ARE SYMPTOMS THAT SHOULD BE REPORTED IMMEDIATELY:  *FEVER GREATER THAN 100.5 F  *CHILLS WITH OR WITHOUT FEVER  NAUSEA AND VOMITING THAT IS NOT CONTROLLED WITH YOUR NAUSEA MEDICATION  *UNUSUAL SHORTNESS OF BREATH  *UNUSUAL BRUISING OR BLEEDING  TENDERNESS IN MOUTH AND THROAT WITH OR WITHOUT PRESENCE OF ULCERS  *URINARY PROBLEMS  *BOWEL PROBLEMS  UNUSUAL RASH Items with * indicate a potential emergency and should be followed up as soon as possible.  Feel free to call the clinic you have any questions or concerns. The clinic phone number is [phone].

## 2012-12-05 NOTE — Progress Notes (Addendum)
[facility] [phone]   [phone]  SHARED VISIT PROGRESS NOTE  Tally Due, MD [address] [place] [address]  PRINCIPAL DIAGNOSIS: Bulky stage IV low grade lymphoma diagnosed in November 2009.   PRIOR THERAPY:  1. Status post 7 cycles of systemic chemotherapy with CHOP/Rituxan. Last dose was given May 2010. 2. Status post 6 cycles of maintenance Rituxan 375 mg/sq m given every 2 months. Last dose was given on May 29, 2010, and the patient was lost to followup at that time. He received another dose on 12/11/2010, then again missed 2 doses. 3. systemic chemotherapy with Rituxan 375 mg/M2 on day 1 and that bendamustine 90 mg/M2 on days 1 and 2 every 4 weeks, status post 6 cycles.  CURRENT THERAPY: Maintenance Rituxan 375 mg/M2 every 2 months, status post 5 cycles  INTERVAL HISTORY: Eric [name] 72 y.o. male returns to the clinic today for followup visit. The patient is feeling fine today with no specific complaints. He reports that he is trying to eat healthier and is going to the gym 5 days a week. He does note some left shoulder pain but states that it is not severe enough for him to take pain medication 4. He has some concern about some questionable swelling in the right side of his neck. He denied any upper respiratory illnesses, no sore throats or earaches. Is not having any night sweats. He denied having any significant chest pain, shortness breath, cough or hemoptysis. He denied having any weight loss.  He has no peripheral lymphadenopathy. He is tolerating his treatment with maintenance rituximab fairly well with no significant adverse effects.   MEDICAL HISTORY: Past Medical History  Diagnosis Date  . Cancer   . Non Hodgkin's lymphoma   . History of heart attack     ALLERGIES:  is allergic to neosporin.  MEDICATIONS:  Current Outpatient Prescriptions  Medication Sig Dispense Refill  . aspirin EC 325 MG EC tablet  Take 1 tablet (325 mg total) by mouth daily.  30 tablet    . atorvastatin (LIPITOR) 40 MG tablet Take 1 tablet (40 mg total) by mouth daily at 6 PM.  30 tablet  11  . cyanocobalamin 1000 MCG tablet Take 100 mcg by mouth daily.      . diphenhydrAMINE (BENADRYL) 25 MG tablet Take 25 mg by mouth at bedtime as needed. Sleep      . ferrous sulfate 325 (65 FE) MG tablet Take 325 mg by mouth daily with breakfast.      . nitroGLYCERIN (NITROSTAT) 0.4 MG SL tablet Place 1 tablet (0.4 mg total) under the tongue every 5 (five) minutes x 3 doses as needed for chest pain.  25 tablet  3  . prasugrel (EFFIENT) 10 MG TABS Take 1 tablet (10 mg total) by mouth daily.  30 tablet  11  . sildenafil (VIAGRA) 25 MG tablet Take 2-3 tablets once a day as directed.  30 tablet  1  . zolpidem (AMBIEN) 10 MG tablet Take 1 tablet (10 mg total) by mouth at bedtime as needed for sleep.  30 tablet  3   No current facility-administered medications for this visit.    SURGICAL HISTORY:  Past Surgical History  Procedure Laterality Date  . Coronary stent placement      REVIEW OF SYSTEMS:  A comprehensive review of systems was negative.   PHYSICAL EXAMINATION: General appearance: alert, cooperative and no distress Head: Normocephalic, without obvious abnormality, atraumatic  Neck: mild anterior cervical adenopathy Lymph nodes: Cervical, supraclavicular, and axillary nodes normal. and The right cervical chain adenopathy is perhaps slightly more easily palpable however and not significantly enlarged, is nontender Resp: clear to auscultation bilaterally Cardio: regular rate and rhythm, S1, S2 normal, no murmur, click, rub or gallop GI: soft, non-tender; bowel sounds normal; no masses,  no organomegaly Extremities: extremities normal, atraumatic, no cyanosis or edema  ECOG PERFORMANCE STATUS: 0 - Asymptomatic  Blood pressure 129/69, pulse 70, temperature 98.2 F (36.8 C), temperature source Oral, resp. rate 18, height 5\' 7"   (1.702 m), weight 147 lb 11.2 oz (66.996 kg), SpO2 99.00%.  LABORATORY DATA: Lab Results  Component Value Date   WBC 4.9 12/05/2012   HGB 13.2 12/05/2012   HCT 38.5 12/05/2012   MCV 92.1 12/05/2012   PLT 204 12/05/2012      Chemistry      Component Value Date/Time   NA 143 12/05/2012 1009   NA 142 03/05/2012 0545   NA 141 03/23/2010 1301   K 4.4 12/05/2012 1009   K 3.3* 03/05/2012 0545   K 4.7 03/23/2010 1301   CL 106 08/06/2012 1244   CL 107 03/05/2012 0545   CL 99 03/23/2010 1301   CO2 27 12/05/2012 1009   CO2 23 03/05/2012 0545   CO2 26 03/23/2010 1301   BUN 11.5 12/05/2012 1009   BUN 9 03/05/2012 0545   BUN 12 03/23/2010 1301   CREATININE 0.9 12/05/2012 1009   CREATININE 0.92 03/05/2012 0545   CREATININE 1.0 03/23/2010 1301      Component Value Date/Time   CALCIUM 10.0 12/05/2012 1009   CALCIUM 8.5 03/05/2012 0545   CALCIUM 9.1 03/23/2010 1301   ALKPHOS 129 12/05/2012 1009   ALKPHOS 86 03/03/2012 0030   ALKPHOS 78 03/23/2010 1301   AST 26 12/05/2012 1009   AST 97* 03/03/2012 0030   AST 25 03/23/2010 1301   ALT 23 12/05/2012 1009   ALT 25 03/03/2012 0030   ALT 20 03/23/2010 1301   BILITOT 0.40 12/05/2012 1009   BILITOT 0.2* 03/03/2012 0030   BILITOT 0.60 03/23/2010 1301       RADIOGRAPHIC STUDIES: No results found.  ASSESSMENT AND PLAN: This is a very pleasant 71 years old male with history of bulky stage IV low-grade lymphoma currently on maintenance Rituxan status post 5 cycles. He is tolerating his treatment fairly well with no significant adverse effects. We will proceed with cycle #6 today as scheduled. Patient was discussed with him also seen by Dr. Arbutus Ped. When he returns in 2 months he will be due for restaging CT scans to reevaluate his disease. We will include a CT of the neck as well as chest, abdomen and pelvis with contrast to reevaluate his disease when he returns in 2 months for his next scheduled cycle of maintenance Rituxan. The last that the CT of the chest also include the left  shoulder to evaluate for any possible lytic lesions in this area. He will see Dr. Arbutus Ped at that visit to we'll review the CT scan results with the patient. The patient would come back for followup visit in 2 months with the start of cycle #7.  Laural Benes, Odile Veloso E, PA-C   He was advised to call immediately if he has any concerning symptoms in the interval. The patient voices understanding of current disease status and treatment options and is in agreement with the current care plan.  All questions were answered. The patient knows to call the clinic  with any problems, questions or concerns. We can certainly see the patient much sooner if necessary.  ADDENDUM: Hematology/Oncology Attending: I had a face to face encounter with the patient. I recommended his care plan. This is a very pleasant 72 years old male with bulky stage IV low-grade status post treatment with CHOP/Rituxan as well as bendamustine and Rituxan. He is currently on maintenance Rituxan is status post 5 cycles. The patient is tolerating his maintenance therapy fairly well. We will proceed with cycle #6 today as scheduled. The patient would come back for follow up visit in 2 months with repeat CT scan of the neck, chest, abdomen and pelvis for restaging of his disease. He was advised to call immediately if he has any concerning symptoms in the interval. Lajuana Matte., MD 12/07/2012

## 2012-12-05 NOTE — Patient Instructions (Addendum)
Follow up with Dr. Arbutus Ped in 2 months with a restaging CT of your neck, chest, abdomen and pelvis to re-evaluate your disease, prior to your next scheduled cycle of maitenance Rituxan

## 2012-12-05 NOTE — Telephone Encounter (Signed)
Gave pt appt for lab, Md, and ct , emailed Marcelino Duster regarding chemo after MD visit 2014

## 2012-12-08 ENCOUNTER — Telehealth: Payer: Self-pay | Admitting: *Deleted

## 2012-12-08 NOTE — Telephone Encounter (Signed)
Per staff message and POF I have scheduled appts.  JMW  

## 2012-12-09 ENCOUNTER — Telehealth: Payer: Self-pay | Admitting: Internal Medicine

## 2012-12-09 NOTE — Telephone Encounter (Signed)
Gave pt appt for lab,CT, chemo and MD for December 2014

## 2013-01-23 ENCOUNTER — Telehealth: Payer: Self-pay | Admitting: Radiology

## 2013-01-23 NOTE — Telephone Encounter (Signed)
Wrong  Oren Bracket

## 2013-02-02 ENCOUNTER — Other Ambulatory Visit (HOSPITAL_COMMUNITY): Payer: Medicare Other

## 2013-02-02 ENCOUNTER — Other Ambulatory Visit (HOSPITAL_BASED_OUTPATIENT_CLINIC_OR_DEPARTMENT_OTHER): Payer: Medicare Other | Admitting: Lab

## 2013-02-02 ENCOUNTER — Ambulatory Visit (HOSPITAL_COMMUNITY): Payer: Medicare Other

## 2013-02-02 ENCOUNTER — Ambulatory Visit (HOSPITAL_COMMUNITY)
Admission: RE | Admit: 2013-02-02 | Discharge: 2013-02-02 | Disposition: A | Discharge status: Home / Self Care | Payer: Medicare Other | Source: Ambulatory Visit | Attending: Physician Assistant | Admitting: Physician Assistant

## 2013-02-02 DIAGNOSIS — C8590 Non-Hodgkin lymphoma, unspecified, unspecified site: Secondary | ICD-10-CM

## 2013-02-02 DIAGNOSIS — C8589 Other specified types of non-Hodgkin lymphoma, extranodal and solid organ sites: Secondary | ICD-10-CM

## 2013-02-02 DIAGNOSIS — J320 Chronic maxillary sinusitis: Secondary | ICD-10-CM | POA: Insufficient documentation

## 2013-02-02 DIAGNOSIS — K5730 Diverticulosis of large intestine without perforation or abscess without bleeding: Secondary | ICD-10-CM | POA: Insufficient documentation

## 2013-02-02 DIAGNOSIS — N281 Cyst of kidney, acquired: Secondary | ICD-10-CM | POA: Insufficient documentation

## 2013-02-02 DIAGNOSIS — N400 Enlarged prostate without lower urinary tract symptoms: Secondary | ICD-10-CM | POA: Insufficient documentation

## 2013-02-02 DIAGNOSIS — K449 Diaphragmatic hernia without obstruction or gangrene: Secondary | ICD-10-CM | POA: Insufficient documentation

## 2013-02-02 LAB — CBC WITH DIFFERENTIAL/PLATELET
Basophils Absolute: 0.1 10*3/uL (ref 0.0–0.1)
EOS%: 5.3 % (ref 0.0–7.0)
Eosinophils Absolute: 0.3 10*3/uL (ref 0.0–0.5)
HGB: 14.9 g/dL (ref 13.0–17.1)
MCH: 31.2 pg (ref 27.2–33.4)
NEUT#: 3.1 10*3/uL (ref 1.5–6.5)
RDW: 13.7 % (ref 11.0–14.6)
lymph#: 1.5 10*3/uL (ref 0.9–3.3)

## 2013-02-02 LAB — COMPREHENSIVE METABOLIC PANEL (CC13)
ALT: 26 U/L (ref 0–55)
AST: 32 U/L (ref 5–34)
Albumin: 4 g/dL (ref 3.5–5.0)
Anion Gap: 11 mEq/L (ref 3–11)
BUN: 14.8 mg/dL (ref 7.0–26.0)
Calcium: 10 mg/dL (ref 8.4–10.4)
Chloride: 106 mEq/L (ref 98–109)
Potassium: 4.5 mEq/L (ref 3.5–5.1)

## 2013-02-02 LAB — LACTATE DEHYDROGENASE (CC13): LDH: 250 U/L — ABNORMAL HIGH (ref 125–245)

## 2013-02-02 LAB — TECHNOLOGIST REVIEW: Technologist Review: 4

## 2013-02-02 MED ORDER — IOHEXOL 300 MG/ML  SOLN
100.0000 mL | Freq: Once | INTRAMUSCULAR | Status: AC | PRN
  Administered 2013-02-02: 100 mL via INTRAVENOUS

## 2013-02-03 ENCOUNTER — Ambulatory Visit (HOSPITAL_BASED_OUTPATIENT_CLINIC_OR_DEPARTMENT_OTHER): Payer: Medicare Other | Admitting: Internal Medicine

## 2013-02-03 ENCOUNTER — Other Ambulatory Visit: Payer: Medicare Other | Admitting: Lab

## 2013-02-03 ENCOUNTER — Encounter: Payer: Self-pay | Admitting: Internal Medicine

## 2013-02-03 ENCOUNTER — Ambulatory Visit (HOSPITAL_BASED_OUTPATIENT_CLINIC_OR_DEPARTMENT_OTHER): Payer: Medicare Other

## 2013-02-03 VITALS — BP 111/68 | HR 76 | Temp 97.0°F | Resp 18 | Ht 67.0 in | Wt 152.6 lb

## 2013-02-03 VITALS — BP 108/60 | HR 67 | Temp 96.9°F | Resp 18

## 2013-02-03 DIAGNOSIS — C8589 Other specified types of non-Hodgkin lymphoma, extranodal and solid organ sites: Secondary | ICD-10-CM

## 2013-02-03 DIAGNOSIS — Z5112 Encounter for antineoplastic immunotherapy: Secondary | ICD-10-CM

## 2013-02-03 DIAGNOSIS — C8590 Non-Hodgkin lymphoma, unspecified, unspecified site: Secondary | ICD-10-CM

## 2013-02-03 MED ORDER — ACETAMINOPHEN 325 MG PO TABS
650.0000 mg | ORAL_TABLET | Freq: Once | ORAL | Status: AC
  Administered 2013-02-03: 650 mg via ORAL

## 2013-02-03 MED ORDER — DIPHENHYDRAMINE HCL 25 MG PO CAPS
50.0000 mg | ORAL_CAPSULE | Freq: Once | ORAL | Status: AC
  Administered 2013-02-03: 50 mg via ORAL

## 2013-02-03 MED ORDER — DIPHENHYDRAMINE HCL 25 MG PO CAPS
ORAL_CAPSULE | ORAL | Status: AC
  Filled 2013-02-03: qty 2

## 2013-02-03 MED ORDER — SODIUM CHLORIDE 0.9 % IV SOLN
375.0000 mg/m2 | Freq: Once | INTRAVENOUS | Status: AC
  Administered 2013-02-03: 700 mg via INTRAVENOUS
  Filled 2013-02-03: qty 70

## 2013-02-03 MED ORDER — ACETAMINOPHEN 325 MG PO TABS
ORAL_TABLET | ORAL | Status: AC
  Filled 2013-02-03: qty 2

## 2013-02-03 MED ORDER — SODIUM CHLORIDE 0.9 % IV SOLN
Freq: Once | INTRAVENOUS | Status: AC
Start: 2013-02-03 — End: 2013-02-03
  Administered 2013-02-03: 09:00:00 via INTRAVENOUS

## 2013-02-03 NOTE — Addendum Note (Signed)
Addended by: Corky Sing on: 02/03/2013 11:32 AM   Modules accepted: Orders

## 2013-02-03 NOTE — Progress Notes (Signed)
[facility] [phone]   [phone]  OFFICE PROGRESS NOTE  Tally Due, MD [address] [place] [address]  PRINCIPAL DIAGNOSIS: Bulky stage IV low grade lymphoma diagnosed in November 2009.   PRIOR THERAPY:  1. Status post 7 cycles of systemic chemotherapy with CHOP/Rituxan. Last dose was given May 2010. 2. Status post 6 cycles of maintenance Rituxan 375 mg/sq m given every 2 months. Last dose was given on May 29, 2010, and the patient was lost to followup at that time. He received another dose on 12/11/2010, then again missed 2 doses. 3. systemic chemotherapy with Rituxan 375 mg/M2 on day 1 and that bendamustine 90 mg/M2 on days 1 and 2 every 4 weeks, status post 6 cycles.  CURRENT THERAPY: Maintenance Rituxan 375 mg/M2 every 2 months, status post 6 cycles  INTERVAL HISTORY: Eric [name] 72 y.o. male returns to the clinic today for followup visit. The patient is feeling fine today with no specific complaints. He denied having any significant chest pain, shortness of breath, cough or hemoptysis. He denied having any weight loss or night sweats. He has no lymphadenopathy. He is tolerating his treatment with maintenance rituximab fairly well with no significant adverse effects. The patient had repeat CT scan of the neck, chest, abdomen and pelvis performed recently and he is here for evaluation and discussion of his scan results.  MEDICAL HISTORY: Past Medical History  Diagnosis Date  . Cancer   . Non Hodgkin's lymphoma   . History of heart attack     ALLERGIES:  is allergic to neosporin.  MEDICATIONS:  Current Outpatient Prescriptions  Medication Sig Dispense Refill  . aspirin EC 325 MG EC tablet Take 1 tablet (325 mg total) by mouth daily.  30 tablet    . atorvastatin (LIPITOR) 40 MG tablet Take 1 tablet (40 mg total) by mouth daily at 6 PM.  30 tablet  11  . cyanocobalamin 1000 MCG tablet Take 100 mcg by mouth daily.       . ferrous sulfate 325 (65 FE) MG tablet Take 325 mg by mouth daily with breakfast.      . prasugrel (EFFIENT) 10 MG TABS Take 1 tablet (10 mg total) by mouth daily.  30 tablet  11  . zolpidem (AMBIEN) 10 MG tablet Take 1 tablet (10 mg total) by mouth at bedtime as needed for sleep.  30 tablet  3  . diphenhydrAMINE (BENADRYL) 25 MG tablet Take 25 mg by mouth at bedtime as needed. Sleep      . nitroGLYCERIN (NITROSTAT) 0.4 MG SL tablet Place 1 tablet (0.4 mg total) under the tongue every 5 (five) minutes x 3 doses as needed for chest pain.  25 tablet  3  . sildenafil (VIAGRA) 25 MG tablet Take 2-3 tablets once a day as directed.  30 tablet  1   No current facility-administered medications for this visit.    SURGICAL HISTORY:  Past Surgical History  Procedure Laterality Date  . Coronary stent placement      REVIEW OF SYSTEMS:  Constitutional: negative Eyes: negative Ears, nose, mouth, throat, and face: negative Respiratory: negative Cardiovascular: negative Gastrointestinal: negative Genitourinary:negative Integument/breast: negative Hematologic/lymphatic: negative Musculoskeletal:negative Neurological: negative Behavioral/Psych: negative Endocrine: negative Allergic/Immunologic: negative   PHYSICAL EXAMINATION: General appearance: alert, cooperative and no distress Head: Normocephalic, without obvious abnormality, atraumatic Neck: no adenopathy Lymph nodes: Cervical, supraclavicular, and axillary nodes normal. Resp: clear to auscultation bilaterally Back: symmetric, no curvature. ROM  normal. No CVA tenderness. Cardio: regular rate and rhythm, S1, S2 normal, no murmur, click, rub or gallop GI: soft, non-tender; bowel sounds normal; no masses,  no organomegaly Extremities: extremities normal, atraumatic, no cyanosis or edema Neurologic: Alert and oriented X 3, normal strength and tone. Normal symmetric reflexes. Normal coordination and gait  ECOG PERFORMANCE STATUS: 0 -  Asymptomatic  Blood pressure 111/68, pulse 76, temperature 97 F (36.1 C), temperature source Oral, resp. rate 18, height 5\' 7"  (1.702 m), weight 152 lb 9.6 oz (69.219 kg), SpO2 98.00%.  LABORATORY DATA: Lab Results  Component Value Date   WBC 5.8 02/02/2013   HGB 14.9 02/02/2013   HCT 45.8 02/02/2013   MCV 95.6 02/02/2013   PLT 215 02/02/2013      Chemistry      Component Value Date/Time   NA 142 02/02/2013 1009   NA 142 03/05/2012 0545   NA 141 03/23/2010 1301   K 4.5 02/02/2013 1009   K 3.3* 03/05/2012 0545   K 4.7 03/23/2010 1301   CL 106 08/06/2012 1244   CL 107 03/05/2012 0545   CL 99 03/23/2010 1301   CO2 26 02/02/2013 1009   CO2 23 03/05/2012 0545   CO2 26 03/23/2010 1301   BUN 14.8 02/02/2013 1009   BUN 9 03/05/2012 0545   BUN 12 03/23/2010 1301   CREATININE 1.1 02/02/2013 1009   CREATININE 0.92 03/05/2012 0545   CREATININE 1.0 03/23/2010 1301      Component Value Date/Time   CALCIUM 10.0 02/02/2013 1009   CALCIUM 8.5 03/05/2012 0545   CALCIUM 9.1 03/23/2010 1301   ALKPHOS 107 02/02/2013 1009   ALKPHOS 86 03/03/2012 0030   ALKPHOS 78 03/23/2010 1301   AST 32 02/02/2013 1009   AST 97* 03/03/2012 0030   AST 25 03/23/2010 1301   ALT 26 02/02/2013 1009   ALT 25 03/03/2012 0030   ALT 20 03/23/2010 1301   BILITOT 0.57 02/02/2013 1009   BILITOT 0.2* 03/03/2012 0030   BILITOT 0.60 03/23/2010 1301       RADIOGRAPHIC STUDIES: Ct Soft Tissue Neck W Contrast  02/02/2013   CLINICAL DATA:  Restaging of non-Hodgkin's lymphoma.  EXAM: CT ABDOMEN AND PELVIS WITH CONTRAST; CT CHEST WITH CONTRAST; CT NECK WITH CONTRAST  TECHNIQUE: Multidetector CT imaging of the neck was performed with intravenous contrast.; Multidetector CT imaging of the abdomen and pelvis was performed following the standard protocol during bolus administration of intravenous contrast.; Multidetector CT imaging of the chest was performed following the standard protocol during bolus administration of intravenous contrast.  CONTRAST:   OMNIPAQUE IOHEXOL 300 MG/ML  SOLN  COMPARISON:  07/18/2012  FINDINGS: CT NECK FINDINGS  No lymphadenopathy or other soft tissue masses are identified within the neck.  Salivary glands and thyroid are normal in appearance.  Tonsillar pillars, parapharyngeal soft tissue planes, epiglottis, and larynx are also normal in appearance. Bilateral maxillary sinus disease incidentally noted. No suspicious bone lesions identified.  CT CHEST FINDINGS  No evidence of mediastinal or hilar lymphadenopathy. No adenopathy seen elsewhere within the thorax.  No evidence of pleural or pericardial effusion. Small hiatal hernia noted.  No suspicious pulmonary nodules or masses are identified. No evidence of pulmonary infiltrate or central endobronchial lesion.  CT ABDOMEN AND PELVIS FINDINGS  The liver, gallbladder, pancreas, and adrenal glands are normal in appearance. Small renal cysts are seen bilaterally, however there is no evidence of renal masses or hydronephrosis.  No evidence of abdominal lymphadenopathy. Markedly enlarged prostate gland  remains stable. No pelvic lymphadenopathy or other soft tissue masses identified.  No evidence of inflammatory process or abnormal fluid collections. Sigmoid diverticulosis again demonstrated. No suspicious bone lesions identified.  IMPRESSION: No evidence of recurrent lymphoma within the neck, chest, abdomen, or pelvis.  Stable markedly enlarged prostate.  Diverticulosis. No radiographic evidence of diverticulitis.  Small hiatal hernia.   Electronically Signed   By: Myles Rosenthal M.D.   On: 02/02/2013 15:08   Ct Chest W Contrast  02/02/2013   CLINICAL DATA:  Restaging of non-Hodgkin's lymphoma.  EXAM: CT ABDOMEN AND PELVIS WITH CONTRAST; CT CHEST WITH CONTRAST; CT NECK WITH CONTRAST  TECHNIQUE: Multidetector CT imaging of the neck was performed with intravenous contrast.; Multidetector CT imaging of the abdomen and pelvis was performed following the standard protocol during bolus  administration of intravenous contrast.; Multidetector CT imaging of the chest was performed following the standard protocol during bolus administration of intravenous contrast.  CONTRAST:  OMNIPAQUE IOHEXOL 300 MG/ML  SOLN  COMPARISON:  07/18/2012  FINDINGS: CT NECK FINDINGS  No lymphadenopathy or other soft tissue masses are identified within the neck.  Salivary glands and thyroid are normal in appearance.  Tonsillar pillars, parapharyngeal soft tissue planes, epiglottis, and larynx are also normal in appearance. Bilateral maxillary sinus disease incidentally noted. No suspicious bone lesions identified.  CT CHEST FINDINGS  No evidence of mediastinal or hilar lymphadenopathy. No adenopathy seen elsewhere within the thorax.  No evidence of pleural or pericardial effusion. Small hiatal hernia noted.  No suspicious pulmonary nodules or masses are identified. No evidence of pulmonary infiltrate or central endobronchial lesion.  CT ABDOMEN AND PELVIS FINDINGS  The liver, gallbladder, pancreas, and adrenal glands are normal in appearance. Small renal cysts are seen bilaterally, however there is no evidence of renal masses or hydronephrosis.  No evidence of abdominal lymphadenopathy. Markedly enlarged prostate gland remains stable. No pelvic lymphadenopathy or other soft tissue masses identified.  No evidence of inflammatory process or abnormal fluid collections. Sigmoid diverticulosis again demonstrated. No suspicious bone lesions identified.  IMPRESSION: No evidence of recurrent lymphoma within the neck, chest, abdomen, or pelvis.  Stable markedly enlarged prostate.  Diverticulosis. No radiographic evidence of diverticulitis.  Small hiatal hernia.   Electronically Signed   By: Myles Rosenthal M.D.   On: 02/02/2013 15:08   ASSESSMENT AND PLAN: This is a very pleasant 72 years old male with history of bulky stage IV low-grade lymphoma currently on maintenance Rituxan status post 6 cycles. He is tolerating his  treatment fairly well with no significant adverse effects.  His recent scan showed no evidence for disease progression. I discussed the scan results with the patient today. We will proceed with cycle #7 today as scheduled. The patient would come back for followup visit in 2 months with the start of cycle #8. He was advised to call immediately if he has any concerning symptoms in the interval. The patient voices understanding of current disease status and treatment options and is in agreement with the current care plan.  All questions were answered. The patient knows to call the clinic with any problems, questions or concerns. We can certainly see the patient much sooner if necessary. Total time of the visit was 25 minutes with more than 50% of the time spent in counseling.

## 2013-02-03 NOTE — Patient Instructions (Signed)
The scan showed no evidence for disease progression.  Continue maintenance Rituxan today as scheduled. Followup visit in 2 months with the next cycle of chemotherapy.

## 2013-02-03 NOTE — Patient Instructions (Signed)
[facility] Discharge Instructions for Patients Receiving Chemotherapy  Today you received the following chemotherapy agents Rituxan.  To help prevent nausea and vomiting after your treatment, we encourage you to take your nausea medication as prescribed.   If you develop nausea and vomiting that is not controlled by your nausea medication, call the clinic.   BELOW ARE SYMPTOMS THAT SHOULD BE REPORTED IMMEDIATELY:  *FEVER GREATER THAN 100.5 F  *CHILLS WITH OR WITHOUT FEVER  NAUSEA AND VOMITING THAT IS NOT CONTROLLED WITH YOUR NAUSEA MEDICATION  *UNUSUAL SHORTNESS OF BREATH  *UNUSUAL BRUISING OR BLEEDING  TENDERNESS IN MOUTH AND THROAT WITH OR WITHOUT PRESENCE OF ULCERS  *URINARY PROBLEMS  *BOWEL PROBLEMS  UNUSUAL RASH Items with * indicate a potential emergency and should be followed up as soon as possible.  Feel free to call the clinic you have any questions or concerns. The clinic phone number is [phone].    

## 2013-02-04 ENCOUNTER — Telehealth: Payer: Self-pay | Admitting: Internal Medicine

## 2013-02-04 NOTE — Telephone Encounter (Signed)
Per 12/2 POF appts made Email to MW to add Tx Will call pt when tx added shh

## 2013-02-05 ENCOUNTER — Telehealth: Payer: Self-pay | Admitting: *Deleted

## 2013-02-05 ENCOUNTER — Telehealth: Payer: Self-pay | Admitting: Physician Assistant

## 2013-02-05 NOTE — Telephone Encounter (Signed)
called pt and adv of 1/29 appts after MW added tx shh

## 2013-02-05 NOTE — Telephone Encounter (Signed)
Per staff message and POF I have scheduled appts.  JMW  

## 2013-03-11 ENCOUNTER — Other Ambulatory Visit: Payer: Self-pay | Admitting: Family Medicine

## 2013-03-12 NOTE — Telephone Encounter (Signed)
Zolpidem 10 mg refill phoned to pt's pharmacy.

## 2013-03-16 ENCOUNTER — Telehealth: Payer: Self-pay

## 2013-03-17 ENCOUNTER — Other Ambulatory Visit: Payer: Self-pay

## 2013-03-17 ENCOUNTER — Telehealth: Payer: Self-pay

## 2013-03-17 MED ORDER — PRASUGREL HCL 10 MG PO TABS: 10.0000 mg | ORAL_TABLET | Freq: Every day | ORAL | Status: DC

## 2013-03-18 MED ORDER — ATORVASTATIN CALCIUM 40 MG PO TABS: 40.0000 mg | ORAL_TABLET | Freq: Every day | ORAL | Status: DC

## 2013-03-18 NOTE — Telephone Encounter (Signed)
Done

## 2013-03-20 NOTE — Telephone Encounter (Signed)
refill 

## 2013-04-02 ENCOUNTER — Ambulatory Visit (HOSPITAL_BASED_OUTPATIENT_CLINIC_OR_DEPARTMENT_OTHER): Payer: Medicare Other

## 2013-04-02 ENCOUNTER — Ambulatory Visit (HOSPITAL_BASED_OUTPATIENT_CLINIC_OR_DEPARTMENT_OTHER): Payer: Medicare Other | Admitting: Physician Assistant

## 2013-04-02 ENCOUNTER — Other Ambulatory Visit (HOSPITAL_BASED_OUTPATIENT_CLINIC_OR_DEPARTMENT_OTHER): Payer: Medicare Other

## 2013-04-02 ENCOUNTER — Telehealth: Payer: Self-pay | Admitting: Internal Medicine

## 2013-04-02 ENCOUNTER — Encounter: Payer: Self-pay | Admitting: Physician Assistant

## 2013-04-02 VITALS — BP 131/73 | HR 73 | Temp 97.7°F | Resp 20 | Ht 67.0 in | Wt 153.3 lb

## 2013-04-02 VITALS — BP 119/79 | HR 66 | Temp 97.4°F | Resp 20

## 2013-04-02 DIAGNOSIS — C8590 Non-Hodgkin lymphoma, unspecified, unspecified site: Secondary | ICD-10-CM

## 2013-04-02 DIAGNOSIS — Z5112 Encounter for antineoplastic immunotherapy: Secondary | ICD-10-CM

## 2013-04-02 DIAGNOSIS — C8589 Other specified types of non-Hodgkin lymphoma, extranodal and solid organ sites: Secondary | ICD-10-CM

## 2013-04-02 LAB — CBC WITH DIFFERENTIAL/PLATELET
BASO%: 0.4 % (ref 0.0–2.0)
Basophils Absolute: 0 10*3/uL (ref 0.0–0.1)
EOS ABS: 0.1 10*3/uL (ref 0.0–0.5)
EOS%: 1.6 % (ref 0.0–7.0)
HCT: 44.1 % (ref 38.4–49.9)
HGB: 15.3 g/dL (ref 13.0–17.1)
LYMPH%: 32.5 % (ref 14.0–49.0)
MCH: 31.4 pg (ref 27.2–33.4)
MCHC: 34.7 g/dL (ref 32.0–36.0)
MCV: 90.4 fL (ref 79.3–98.0)
MONO#: 0.8 10*3/uL (ref 0.1–0.9)
MONO%: 16.7 % — ABNORMAL HIGH (ref 0.0–14.0)
NEUT#: 2.4 10*3/uL (ref 1.5–6.5)
NEUT%: 48.8 % (ref 39.0–75.0)
Platelets: 206 10*3/uL (ref 140–400)
RBC: 4.88 10*6/uL (ref 4.20–5.82)
RDW: 13.3 % (ref 11.0–14.6)
WBC: 4.9 10*3/uL (ref 4.0–10.3)
lymph#: 1.6 10*3/uL (ref 0.9–3.3)
nRBC: 0 % (ref 0–0)

## 2013-04-02 LAB — COMPREHENSIVE METABOLIC PANEL (CC13)
ALT: 30 U/L (ref 0–55)
ANION GAP: 10 meq/L (ref 3–11)
AST: 32 U/L (ref 5–34)
Albumin: 4.2 g/dL (ref 3.5–5.0)
Alkaline Phosphatase: 132 U/L (ref 40–150)
BUN: 16.5 mg/dL (ref 7.0–26.0)
CHLORIDE: 106 meq/L (ref 98–109)
CO2: 25 meq/L (ref 22–29)
Calcium: 9.1 mg/dL (ref 8.4–10.4)
Creatinine: 1 mg/dL (ref 0.7–1.3)
Glucose: 94 mg/dl (ref 70–140)
POTASSIUM: 4.2 meq/L (ref 3.5–5.1)
Sodium: 141 mEq/L (ref 136–145)
TOTAL PROTEIN: 6.7 g/dL (ref 6.4–8.3)
Total Bilirubin: 0.49 mg/dL (ref 0.20–1.20)

## 2013-04-02 LAB — LACTATE DEHYDROGENASE: LDH: 224 U/L (ref 94–250)

## 2013-04-02 MED ORDER — SODIUM CHLORIDE 0.9 % IV SOLN: Freq: Once | INTRAVENOUS | Status: DC

## 2013-04-02 MED ORDER — ACETAMINOPHEN 325 MG PO TABS
650.0000 mg | ORAL_TABLET | Freq: Once | ORAL | Status: AC
  Administered 2013-04-02: 650 mg via ORAL

## 2013-04-02 MED ORDER — SODIUM CHLORIDE 0.9 % IV SOLN
375.0000 mg/m2 | Freq: Once | INTRAVENOUS | Status: AC
  Administered 2013-04-02: 700 mg via INTRAVENOUS
  Filled 2013-04-02: qty 70

## 2013-04-02 MED ORDER — DIPHENHYDRAMINE HCL 25 MG PO CAPS
50.0000 mg | ORAL_CAPSULE | Freq: Once | ORAL | Status: AC
  Administered 2013-04-02: 50 mg via ORAL

## 2013-04-02 MED ORDER — DIPHENHYDRAMINE HCL 25 MG PO CAPS
ORAL_CAPSULE | ORAL | Status: AC
  Filled 2013-04-02: qty 2

## 2013-04-02 MED ORDER — ACETAMINOPHEN 325 MG PO TABS
ORAL_TABLET | ORAL | Status: AC
  Filled 2013-04-02: qty 2

## 2013-04-02 NOTE — Patient Instructions (Signed)
[facility] Discharge Instructions for Patients Receiving Chemotherapy  Today you received the following chemotherapy agents: Rituxan. To help prevent nausea and vomiting after your treatment, we encourage you to take your nausea medication.   If you develop nausea and vomiting that is not controlled by your nausea medication, call the clinic.   BELOW ARE SYMPTOMS THAT SHOULD BE REPORTED IMMEDIATELY:  *FEVER GREATER THAN 100.5 F  *CHILLS WITH OR WITHOUT FEVER  NAUSEA AND VOMITING THAT IS NOT CONTROLLED WITH YOUR NAUSEA MEDICATION  *UNUSUAL SHORTNESS OF BREATH  *UNUSUAL BRUISING OR BLEEDING  TENDERNESS IN MOUTH AND THROAT WITH OR WITHOUT PRESENCE OF ULCERS  *URINARY PROBLEMS  *BOWEL PROBLEMS  UNUSUAL RASH Items with * indicate a potential emergency and should be followed up as soon as possible.  Feel free to call the clinic you have any questions or concerns. The clinic phone number is [phone].    

## 2013-04-02 NOTE — Telephone Encounter (Signed)
gv and printed appt sched and avs sfor ptfor March .Marland Kitchen..sed added tx.

## 2013-04-02 NOTE — Progress Notes (Signed)
Vital signs obtained and rituxin rate increased to 200cc/hr.

## 2013-04-02 NOTE — Progress Notes (Addendum)
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   PRIOR THERAPY:  1. Status post 7 cycles of systemic chemotherapy with CHOP/Rituxan. Last dose was given May 2010. 2. Status post 6 cycles of maintenance Rituxan 375 mg/sq m given every 2 months. Last dose was given on May 29, 2010, and the patient was lost to followup at that time. He received another dose on 12/11/2010, then again missed 2 doses. 3. systemic chemotherapy with Rituxan 375 mg/M2 on day 1 and that bendamustine 90 mg/M2 on days 1 and 2 every 4 weeks, status post 6 cycles.  CURRENT THERAPY: Maintenance Rituxan 375 mg/M2 every 2 months, status post 7 cycles  INTERVAL HISTORY: Eric [name] 73 y.o. male returns to the clinic today for followup visit. The patient is feeling fine today with no specific complaints. He denied having any significant chest pain, shortness of breath, cough or hemoptysis. He denied having any weight loss or night sweats. He has no lymphadenopathy. He is tolerating his treatment with maintenance rituximab fairly well with no significant adverse effects. He continues to go to the gym regularly and monitors his blood pressure. He states that he is still trying to work off some of his holiday dietary indiscretions.   MEDICAL HISTORY: Past Medical History  Diagnosis Date  . Cancer   . Non Hodgkin's lymphoma   . History of heart attack     ALLERGIES:  is allergic to neosporin.  MEDICATIONS:  Current Outpatient Prescriptions  Medication Sig Dispense Refill  . aspirin EC 325 MG EC tablet Take 1 tablet (325 mg total) by mouth daily.  30 tablet    . atorvastatin (LIPITOR) 40 MG tablet Take 1 tablet (40 mg total) by mouth daily at 6 PM.  30 tablet  3  . cyanocobalamin 1000 MCG tablet Take 100 mcg by  mouth daily.      . diphenhydrAMINE (BENADRYL) 25 MG tablet Take 25 mg by mouth at bedtime as needed. Sleep      . ferrous sulfate 325 (65 FE) MG tablet Take 325 mg by mouth daily with breakfast.      . prasugrel (EFFIENT) 10 MG TABS tablet Take 1 tablet (10 mg total) by mouth daily.  90 tablet  4  . sildenafil (VIAGRA) 25 MG tablet Take 2-3 tablets once a day as directed.  30 tablet  1  . zolpidem (AMBIEN) 10 MG tablet TAKE 1 TABLET BY MOUTH AT BEDTIME AS NEEDED FOR SLEEP  30 tablet  2  . nitroGLYCERIN (NITROSTAT) 0.4 MG SL tablet Place 1 tablet (0.4 mg total) under the tongue every 5 (five) minutes x 3 doses as needed for chest pain.  25 tablet  3   No current facility-administered medications for this visit.   Facility-Administered Medications Ordered in Other Visits  Medication Dose Route Frequency Provider Last Rate Last Dose  . 0.9 %  sodium chloride infusion   Intravenous Once Curt Bears, MD        SURGICAL HISTORY:  Past Surgical History  Procedure Laterality Date  . Coronary stent placement      REVIEW OF SYSTEMS:  Constitutional: negative Eyes: negative Ears, nose, mouth, throat, and face: negative Respiratory: negative Cardiovascular: negative Gastrointestinal: negative Genitourinary:negative Integument/breast: negative Hematologic/lymphatic: negative Musculoskeletal:negative Neurological: negative Behavioral/Psych: negative Endocrine: negative Allergic/Immunologic:  negative   PHYSICAL EXAMINATION: General appearance: alert, cooperative and no distress Head: Normocephalic, without obvious abnormality, atraumatic Neck: no adenopathy Lymph nodes: Cervical, supraclavicular, and axillary nodes normal. Resp: clear to auscultation bilaterally Back: symmetric, no curvature. ROM normal. No CVA tenderness. Cardio: regular rate and rhythm, S1, S2 normal, no murmur, click, rub or gallop GI: soft, non-tender; bowel sounds normal; no masses,  no  organomegaly Extremities: extremities normal, atraumatic, no cyanosis or edema Neurologic: Alert and oriented X 3, normal strength and tone. Normal symmetric reflexes. Normal coordination and gait  ECOG PERFORMANCE STATUS: 0 - Asymptomatic  Blood pressure 131/73, pulse 73, temperature 97.7 F (36.5 C), temperature source Oral, resp. rate 20, height 5\' 7"  (1.702 m), weight 153 lb 4.8 oz (69.536 kg).  LABORATORY DATA: Lab Results  Component Value Date   WBC 4.9 04/02/2013   HGB 15.3 04/02/2013   HCT 44.1 04/02/2013   MCV 90.4 04/02/2013   PLT 206 04/02/2013      Chemistry      Component Value Date/Time   NA 141 04/02/2013 1109   NA 142 03/05/2012 0545   NA 141 03/23/2010 1301   K 4.2 04/02/2013 1109   K 3.3* 03/05/2012 0545   K 4.7 03/23/2010 1301   CL 106 08/06/2012 1244   CL 107 03/05/2012 0545   CL 99 03/23/2010 1301   CO2 25 04/02/2013 1109   CO2 23 03/05/2012 0545   CO2 26 03/23/2010 1301   BUN 16.5 04/02/2013 1109   BUN 9 03/05/2012 0545   BUN 12 03/23/2010 1301   CREATININE 1.0 04/02/2013 1109   CREATININE 0.92 03/05/2012 0545   CREATININE 1.0 03/23/2010 1301      Component Value Date/Time   CALCIUM 9.1 04/02/2013 1109   CALCIUM 8.5 03/05/2012 0545   CALCIUM 9.1 03/23/2010 1301   ALKPHOS 132 04/02/2013 1109   ALKPHOS 86 03/03/2012 0030   ALKPHOS 78 03/23/2010 1301   AST 32 04/02/2013 1109   AST 97* 03/03/2012 0030   AST 25 03/23/2010 1301   ALT 30 04/02/2013 1109   ALT 25 03/03/2012 0030   ALT 20 03/23/2010 1301   BILITOT 0.49 04/02/2013 1109   BILITOT 0.2* 03/03/2012 0030   BILITOT 0.60 03/23/2010 1301       RADIOGRAPHIC STUDIES: Ct Soft Tissue Neck W Contrast  02/02/2013   CLINICAL DATA:  Restaging of non-Hodgkin's lymphoma.  EXAM: CT ABDOMEN AND PELVIS WITH CONTRAST; CT CHEST WITH CONTRAST; CT NECK WITH CONTRAST  TECHNIQUE: Multidetector CT imaging of the neck was performed with intravenous contrast.; Multidetector CT imaging of the abdomen and pelvis was performed following the standard  protocol during bolus administration of intravenous contrast.; Multidetector CT imaging of the chest was performed following the standard protocol during bolus administration of intravenous contrast.  CONTRAST:  158mL OMNIPAQUE IOHEXOL 300 MG/ML  SOLN  COMPARISON:  07/18/2012  FINDINGS: CT NECK FINDINGS  No lymphadenopathy or other soft tissue masses are identified within the neck.  Salivary glands and thyroid are normal in appearance.  Tonsillar pillars, parapharyngeal soft tissue planes, epiglottis, and larynx are also normal in appearance. Bilateral maxillary sinus disease incidentally noted. No suspicious bone lesions identified.  CT CHEST FINDINGS  No evidence of mediastinal or hilar lymphadenopathy. No adenopathy seen elsewhere within the thorax.  No evidence of pleural or pericardial effusion. Small hiatal hernia noted.  No suspicious pulmonary nodules or masses are identified. No evidence of pulmonary infiltrate or central endobronchial lesion.  CT ABDOMEN AND PELVIS FINDINGS  The liver, gallbladder, pancreas, and adrenal glands are normal in appearance. Small renal cysts are seen bilaterally, however there is no evidence of renal masses or hydronephrosis.  No evidence of abdominal lymphadenopathy. Markedly enlarged prostate gland remains stable. No pelvic lymphadenopathy or other soft tissue masses identified.  No evidence of inflammatory process or abnormal fluid collections. Sigmoid diverticulosis again demonstrated. No suspicious bone lesions identified.  IMPRESSION: No evidence of recurrent lymphoma within the neck, chest, abdomen, or pelvis.  Stable markedly enlarged prostate.  Diverticulosis. No radiographic evidence of diverticulitis.  Small hiatal hernia.   Electronically Signed   By: Earle Gell M.D.   On: 02/02/2013 15:08   Ct Chest W Contrast  02/02/2013   CLINICAL DATA:  Restaging of non-Hodgkin's lymphoma.  EXAM: CT ABDOMEN AND PELVIS WITH CONTRAST; CT CHEST WITH CONTRAST; CT NECK WITH  CONTRAST  TECHNIQUE: Multidetector CT imaging of the neck was performed with intravenous contrast.; Multidetector CT imaging of the abdomen and pelvis was performed following the standard protocol during bolus administration of intravenous contrast.; Multidetector CT imaging of the chest was performed following the standard protocol during bolus administration of intravenous contrast.  CONTRAST:  129mL OMNIPAQUE IOHEXOL 300 MG/ML  SOLN  COMPARISON:  07/18/2012  FINDINGS: CT NECK FINDINGS  No lymphadenopathy or other soft tissue masses are identified within the neck.  Salivary glands and thyroid are normal in appearance.  Tonsillar pillars, parapharyngeal soft tissue planes, epiglottis, and larynx are also normal in appearance. Bilateral maxillary sinus disease incidentally noted. No suspicious bone lesions identified.  CT CHEST FINDINGS  No evidence of mediastinal or hilar lymphadenopathy. No adenopathy seen elsewhere within the thorax.  No evidence of pleural or pericardial effusion. Small hiatal hernia noted.  No suspicious pulmonary nodules or masses are identified. No evidence of pulmonary infiltrate or central endobronchial lesion.  CT ABDOMEN AND PELVIS FINDINGS  The liver, gallbladder, pancreas, and adrenal glands are normal in appearance. Small renal cysts are seen bilaterally, however there is no evidence of renal masses or hydronephrosis.  No evidence of abdominal lymphadenopathy. Markedly enlarged prostate gland remains stable. No pelvic lymphadenopathy or other soft tissue masses identified.  No evidence of inflammatory process or abnormal fluid collections. Sigmoid diverticulosis again demonstrated. No suspicious bone lesions identified.  IMPRESSION: No evidence of recurrent lymphoma within the neck, chest, abdomen, or pelvis.  Stable markedly enlarged prostate.  Diverticulosis. No radiographic evidence of diverticulitis.  Small hiatal hernia.   Electronically Signed   By: Earle Gell M.D.   On:  02/02/2013 15:08   ASSESSMENT AND PLAN: This is a very pleasant 73 years old male with history of bulky stage IV low-grade lymphoma currently on maintenance Rituxan status post 7 cycles. He is tolerating his treatment fairly well with no significant adverse effects.  His recent scan showed no evidence for disease progression. The patient was discussed with and also seen by Dr. Julien Nordmann.  We will proceed with cycle #8 today as scheduled. The patient would come back for followup visit in 2 months with the start of cycle #9. He was advised to call immediately if he has any concerning symptoms in the interval. The patient voices understanding of current disease status and treatment options and is in agreement with the current care plan.  All questions were answered. The patient knows to call the clinic with any problems, questions or concerns. We can certainly see the patient much sooner if necessary.  Carlton Adam PA-C  ADDENDUM:  Hematology/Oncology Attending:  I  had face to face encounter with the patient. I recommended his care plan. This is a very pleasant 73 years old [place] male with history of bulky stage IV low-grade lymphoma currently undergoing maintenance therapy with single agent Rituxan is status post  7 cycles. The patient is rating his treatment fairly well with no significant adverse effects. We'll proceed with cycle #8 today as scheduled. He would come back for follow up visit in 2 months with the next cycle of his treatment. He was advised to call immediately if he has any concerning symptoms in the interval.  Eilleen Kempf., MD 04/04/2013

## 2013-04-03 NOTE — Patient Instructions (Signed)
Follow up in 2 months

## 2013-05-28 ENCOUNTER — Telehealth: Payer: Self-pay | Admitting: Internal Medicine

## 2013-05-28 ENCOUNTER — Ambulatory Visit (HOSPITAL_BASED_OUTPATIENT_CLINIC_OR_DEPARTMENT_OTHER): Payer: Medicare Other

## 2013-05-28 ENCOUNTER — Ambulatory Visit (HOSPITAL_BASED_OUTPATIENT_CLINIC_OR_DEPARTMENT_OTHER): Payer: Medicare Other | Admitting: Internal Medicine

## 2013-05-28 ENCOUNTER — Other Ambulatory Visit (HOSPITAL_BASED_OUTPATIENT_CLINIC_OR_DEPARTMENT_OTHER): Payer: Medicare Other

## 2013-05-28 VITALS — BP 115/67 | HR 64 | Temp 97.6°F | Resp 18 | Ht 67.0 in | Wt 157.5 lb

## 2013-05-28 VITALS — BP 118/75 | HR 53 | Temp 97.6°F | Resp 16

## 2013-05-28 DIAGNOSIS — Z5112 Encounter for antineoplastic immunotherapy: Secondary | ICD-10-CM

## 2013-05-28 DIAGNOSIS — C8590 Non-Hodgkin lymphoma, unspecified, unspecified site: Secondary | ICD-10-CM

## 2013-05-28 DIAGNOSIS — C8589 Other specified types of non-Hodgkin lymphoma, extranodal and solid organ sites: Secondary | ICD-10-CM

## 2013-05-28 LAB — CBC WITH DIFFERENTIAL/PLATELET
BASO%: 0.5 % (ref 0.0–2.0)
BASOS ABS: 0 10*3/uL (ref 0.0–0.1)
EOS%: 3.7 % (ref 0.0–7.0)
Eosinophils Absolute: 0.1 10*3/uL (ref 0.0–0.5)
HCT: 40.9 % (ref 38.4–49.9)
HGB: 14 g/dL (ref 13.0–17.1)
LYMPH%: 26.9 % (ref 14.0–49.0)
MCH: 31.2 pg (ref 27.2–33.4)
MCHC: 34.2 g/dL (ref 32.0–36.0)
MCV: 91.1 fL (ref 79.3–98.0)
MONO#: 0.6 10*3/uL (ref 0.1–0.9)
MONO%: 14.8 % — ABNORMAL HIGH (ref 0.0–14.0)
NEUT#: 2.1 10*3/uL (ref 1.5–6.5)
NEUT%: 54.1 % (ref 39.0–75.0)
NRBC: 0 % (ref 0–0)
Platelets: 235 10*3/uL (ref 140–400)
RBC: 4.49 10*6/uL (ref 4.20–5.82)
RDW: 13.6 % (ref 11.0–14.6)
WBC: 3.8 10*3/uL — ABNORMAL LOW (ref 4.0–10.3)
lymph#: 1 10*3/uL (ref 0.9–3.3)

## 2013-05-28 LAB — COMPREHENSIVE METABOLIC PANEL (CC13)
ALBUMIN: 3.7 g/dL (ref 3.5–5.0)
ALT: 24 U/L (ref 0–55)
AST: 29 U/L (ref 5–34)
Alkaline Phosphatase: 117 U/L (ref 40–150)
Anion Gap: 10 mEq/L (ref 3–11)
BILIRUBIN TOTAL: 0.5 mg/dL (ref 0.20–1.20)
BUN: 17.2 mg/dL (ref 7.0–26.0)
CO2: 23 mEq/L (ref 22–29)
Calcium: 9.4 mg/dL (ref 8.4–10.4)
Chloride: 110 mEq/L — ABNORMAL HIGH (ref 98–109)
Creatinine: 1 mg/dL (ref 0.7–1.3)
Glucose: 97 mg/dl (ref 70–140)
POTASSIUM: 4.2 meq/L (ref 3.5–5.1)
SODIUM: 143 meq/L (ref 136–145)
TOTAL PROTEIN: 6.3 g/dL — AB (ref 6.4–8.3)

## 2013-05-28 LAB — LACTATE DEHYDROGENASE (CC13): LDH: 232 U/L (ref 125–245)

## 2013-05-28 MED ORDER — ACETAMINOPHEN 325 MG PO TABS
650.0000 mg | ORAL_TABLET | Freq: Once | ORAL | Status: AC
  Administered 2013-05-28: 650 mg via ORAL

## 2013-05-28 MED ORDER — SODIUM CHLORIDE 0.9 % IV SOLN
375.0000 mg/m2 | Freq: Once | INTRAVENOUS | Status: AC
  Administered 2013-05-28: 700 mg via INTRAVENOUS
  Filled 2013-05-28: qty 70

## 2013-05-28 MED ORDER — SODIUM CHLORIDE 0.9 % IV SOLN
Freq: Once | INTRAVENOUS | Status: AC
  Administered 2013-05-28: 12:00:00 via INTRAVENOUS

## 2013-05-28 MED ORDER — DIPHENHYDRAMINE HCL 25 MG PO CAPS
50.0000 mg | ORAL_CAPSULE | Freq: Once | ORAL | Status: AC
  Administered 2013-05-28: 50 mg via ORAL

## 2013-05-28 MED ORDER — DIPHENHYDRAMINE HCL 25 MG PO CAPS
ORAL_CAPSULE | ORAL | Status: AC
  Filled 2013-05-28: qty 2

## 2013-05-28 MED ORDER — ACETAMINOPHEN 325 MG PO TABS
ORAL_TABLET | ORAL | Status: AC
  Filled 2013-05-28: qty 2

## 2013-05-28 NOTE — Progress Notes (Signed)
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   PRIOR THERAPY:  1. Status post 7 cycles of systemic chemotherapy with CHOP/Rituxan. Last dose was given May 2010. 2. Status post 6 cycles of maintenance Rituxan 375 mg/sq m given every 2 months. Last dose was given on May 29, 2010, and the patient was lost to followup at that time. He received another dose on 12/11/2010, then again missed 2 doses. 3. systemic chemotherapy with Rituxan 375 mg/M2 on day 1 and that bendamustine 90 mg/M2 on days 1 and 2 every 4 weeks, status post 6 cycles.  CURRENT THERAPY: Maintenance Rituxan 375 mg/M2 every 2 months, status post 8 cycles  INTERVAL HISTORY: Eric [name] 73 y.o. male returns to the clinic today for followup visit. The patient is feeling fine today with no specific complaints. He denied having any significant chest pain, shortness of breath, cough or hemoptysis. He denied having any weight loss or night sweats. He has no lymphadenopathy. He is tolerating his treatment with maintenance rituximab fairly well with no significant adverse effects.   MEDICAL HISTORY: Past Medical History  Diagnosis Date  . Cancer   . Non Hodgkin's lymphoma   . History of heart attack     ALLERGIES:  is allergic to neosporin.  MEDICATIONS:  Current Outpatient Prescriptions  Medication Sig Dispense Refill  . aspirin EC 325 MG EC tablet Take 1 tablet (325 mg total) by mouth daily.  30 tablet    . atorvastatin (LIPITOR) 40 MG tablet Take 1 tablet (40 mg total) by mouth daily at 6 PM.  30 tablet  3  . cyanocobalamin 1000 MCG tablet Take 100 mcg by mouth daily.      . diphenhydrAMINE (BENADRYL) 25 MG tablet Take 25 mg by mouth at bedtime as needed. Sleep      . ferrous sulfate 325 (65 FE) MG tablet Take 325 mg by  mouth daily with breakfast.      . nitroGLYCERIN (NITROSTAT) 0.4 MG SL tablet Place 1 tablet (0.4 mg total) under the tongue every 5 (five) minutes x 3 doses as needed for chest pain.  25 tablet  3  . prasugrel (EFFIENT) 10 MG TABS tablet Take 1 tablet (10 mg total) by mouth daily.  90 tablet  4  . sildenafil (VIAGRA) 25 MG tablet Take 2-3 tablets once a day as directed.  30 tablet  1  . zolpidem (AMBIEN) 10 MG tablet TAKE 1 TABLET BY MOUTH AT BEDTIME AS NEEDED FOR SLEEP  30 tablet  2   No current facility-administered medications for this visit.    SURGICAL HISTORY:  Past Surgical History  Procedure Laterality Date  . Coronary stent placement      REVIEW OF SYSTEMS:  Constitutional: negative Eyes: negative Ears, nose, mouth, throat, and face: negative Respiratory: negative Cardiovascular: negative Gastrointestinal: negative Genitourinary:negative Integument/breast: negative Hematologic/lymphatic: negative Musculoskeletal:negative Neurological: negative Behavioral/Psych: negative Endocrine: negative Allergic/Immunologic: negative   PHYSICAL EXAMINATION: General appearance: alert, cooperative and no distress Head: Normocephalic, without obvious abnormality, atraumatic Neck: no adenopathy Lymph nodes: Cervical, supraclavicular, and axillary nodes normal. Resp: clear to auscultation bilaterally Back: symmetric, no curvature. ROM normal. No CVA tenderness. Cardio: regular rate and rhythm, S1, S2 normal, no murmur, click, rub or gallop GI: soft, non-tender; bowel sounds normal; no masses,  no organomegaly  Extremities: extremities normal, atraumatic, no cyanosis or edema Neurologic: Alert and oriented X 3, normal strength and tone. Normal symmetric reflexes. Normal coordination and gait  ECOG PERFORMANCE STATUS: 0 - Asymptomatic  Blood pressure 115/67, pulse 64, temperature 97.6 F (36.4 C), temperature source Oral, resp. rate 18, height 5\' 7"  (1.702 m), weight 157 lb 8 oz  (71.442 kg), SpO2 100.00%.  LABORATORY DATA: Lab Results  Component Value Date   WBC 3.8* 05/28/2013   HGB 14.0 05/28/2013   HCT 40.9 05/28/2013   MCV 91.1 05/28/2013   PLT 235 05/28/2013      Chemistry      Component Value Date/Time   NA 141 04/02/2013 1109   NA 142 03/05/2012 0545   NA 141 03/23/2010 1301   K 4.2 04/02/2013 1109   K 3.3* 03/05/2012 0545   K 4.7 03/23/2010 1301   CL 106 08/06/2012 1244   CL 107 03/05/2012 0545   CL 99 03/23/2010 1301   CO2 25 04/02/2013 1109   CO2 23 03/05/2012 0545   CO2 26 03/23/2010 1301   BUN 16.5 04/02/2013 1109   BUN 9 03/05/2012 0545   BUN 12 03/23/2010 1301   CREATININE 1.0 04/02/2013 1109   CREATININE 0.92 03/05/2012 0545   CREATININE 1.0 03/23/2010 1301      Component Value Date/Time   CALCIUM 9.1 04/02/2013 1109   CALCIUM 8.5 03/05/2012 0545   CALCIUM 9.1 03/23/2010 1301   ALKPHOS 132 04/02/2013 1109   ALKPHOS 86 03/03/2012 0030   ALKPHOS 78 03/23/2010 1301   AST 32 04/02/2013 1109   AST 97* 03/03/2012 0030   AST 25 03/23/2010 1301   ALT 30 04/02/2013 1109   ALT 25 03/03/2012 0030   ALT 20 03/23/2010 1301   BILITOT 0.49 04/02/2013 1109   BILITOT 0.2* 03/03/2012 0030   BILITOT 0.60 03/23/2010 1301       RADIOGRAPHIC STUDIES:  ASSESSMENT AND PLAN: This is a very pleasant 73 years old male with history of bulky stage IV low-grade lymphoma currently on maintenance Rituxan status post 8 cycles. He is tolerating his treatment fairly well with no significant adverse effects.   We will proceed with cycle #9 today as scheduled. The patient would come back for followup visit in 2 months with the start of cycle #10 after repeating CT scan of the chest, abdomen and pelvis for restaging of his disease. He was advised to call immediately if he has any concerning symptoms in the interval. The patient voices understanding of current disease status and treatment options and is in agreement with the current care plan.  All questions were answered. The patient knows to  call the clinic with any problems, questions or concerns. We can certainly see the patient much sooner if necessary.  Disclaimer: This note was dictated with voice recognition software. Similar sounding words can inadvertently be transcribed and may not be corrected upon review.

## 2013-05-28 NOTE — Patient Instructions (Signed)
[facility] Discharge Instructions for Patients Receiving Chemotherapy  Today you received the following chemotherapy agents rituxan.     If you develop nausea and vomiting , call the clinic.   BELOW ARE SYMPTOMS THAT SHOULD BE REPORTED IMMEDIATELY:  *FEVER GREATER THAN 100.5 F  *CHILLS WITH OR WITHOUT FEVER  NAUSEA AND VOMITING THAT IS NOT CONTROLLED WITH YOUR NAUSEA MEDICATION  *UNUSUAL SHORTNESS OF BREATH  *UNUSUAL BRUISING OR BLEEDING  TENDERNESS IN MOUTH AND THROAT WITH OR WITHOUT PRESENCE OF ULCERS  *URINARY PROBLEMS  *BOWEL PROBLEMS  UNUSUAL RASH Items with * indicate a potential emergency and should be followed up as soon as possible.  Feel free to call the clinic you have any questions or concerns. The clinic phone number is [phone].

## 2013-05-28 NOTE — Telephone Encounter (Signed)
gv pt appt schedule for may. central will call w/ct appt. pt aware.

## 2013-05-30 ENCOUNTER — Encounter: Payer: Self-pay | Admitting: Internal Medicine

## 2013-07-22 ENCOUNTER — Encounter (HOSPITAL_COMMUNITY): Payer: Self-pay

## 2013-07-22 ENCOUNTER — Other Ambulatory Visit: Payer: Medicare Other

## 2013-07-22 ENCOUNTER — Ambulatory Visit (HOSPITAL_COMMUNITY)
Admission: RE | Admit: 2013-07-22 | Discharge: 2013-07-22 | Disposition: A | Discharge status: Home / Self Care | Payer: Medicare Other | Source: Ambulatory Visit | Attending: Internal Medicine | Admitting: Internal Medicine

## 2013-07-22 ENCOUNTER — Other Ambulatory Visit (HOSPITAL_BASED_OUTPATIENT_CLINIC_OR_DEPARTMENT_OTHER): Payer: Medicare Other

## 2013-07-22 DIAGNOSIS — C8590 Non-Hodgkin lymphoma, unspecified, unspecified site: Secondary | ICD-10-CM

## 2013-07-22 DIAGNOSIS — I2510 Atherosclerotic heart disease of native coronary artery without angina pectoris: Secondary | ICD-10-CM | POA: Insufficient documentation

## 2013-07-22 DIAGNOSIS — C8589 Other specified types of non-Hodgkin lymphoma, extranodal and solid organ sites: Secondary | ICD-10-CM | POA: Insufficient documentation

## 2013-07-22 DIAGNOSIS — N400 Enlarged prostate without lower urinary tract symptoms: Secondary | ICD-10-CM | POA: Insufficient documentation

## 2013-07-22 DIAGNOSIS — K449 Diaphragmatic hernia without obstruction or gangrene: Secondary | ICD-10-CM | POA: Insufficient documentation

## 2013-07-22 DIAGNOSIS — K7689 Other specified diseases of liver: Secondary | ICD-10-CM | POA: Insufficient documentation

## 2013-07-22 DIAGNOSIS — K5730 Diverticulosis of large intestine without perforation or abscess without bleeding: Secondary | ICD-10-CM | POA: Insufficient documentation

## 2013-07-22 DIAGNOSIS — N281 Cyst of kidney, acquired: Secondary | ICD-10-CM | POA: Insufficient documentation

## 2013-07-22 LAB — CBC WITH DIFFERENTIAL/PLATELET
BASO%: 0.5 % (ref 0.0–2.0)
Basophils Absolute: 0 10*3/uL (ref 0.0–0.1)
EOS%: 5.5 % (ref 0.0–7.0)
Eosinophils Absolute: 0.3 10*3/uL (ref 0.0–0.5)
HEMATOCRIT: 42.5 % (ref 38.4–49.9)
HEMOGLOBIN: 14.3 g/dL (ref 13.0–17.1)
LYMPH#: 1.6 10*3/uL (ref 0.9–3.3)
LYMPH%: 30.3 % (ref 14.0–49.0)
MCH: 31.1 pg (ref 27.2–33.4)
MCHC: 33.6 g/dL (ref 32.0–36.0)
MCV: 92.7 fL (ref 79.3–98.0)
MONO#: 0.9 10*3/uL (ref 0.1–0.9)
MONO%: 16.9 % — ABNORMAL HIGH (ref 0.0–14.0)
NEUT#: 2.4 10*3/uL (ref 1.5–6.5)
NEUT%: 46.8 % (ref 39.0–75.0)
Platelets: 204 10*3/uL (ref 140–400)
RBC: 4.58 10*6/uL (ref 4.20–5.82)
RDW: 13.7 % (ref 11.0–14.6)
WBC: 5.2 10*3/uL (ref 4.0–10.3)

## 2013-07-22 LAB — COMPREHENSIVE METABOLIC PANEL (CC13)
ALBUMIN: 3.7 g/dL (ref 3.5–5.0)
ALT: 22 U/L (ref 0–55)
ANION GAP: 12 meq/L — AB (ref 3–11)
AST: 27 U/L (ref 5–34)
Alkaline Phosphatase: 102 U/L (ref 40–150)
BUN: 12.9 mg/dL (ref 7.0–26.0)
CHLORIDE: 106 meq/L (ref 98–109)
CO2: 24 meq/L (ref 22–29)
CREATININE: 1.1 mg/dL (ref 0.7–1.3)
Calcium: 9.1 mg/dL (ref 8.4–10.4)
Glucose: 89 mg/dl (ref 70–140)
POTASSIUM: 4.3 meq/L (ref 3.5–5.1)
Sodium: 142 mEq/L (ref 136–145)
TOTAL PROTEIN: 6.3 g/dL — AB (ref 6.4–8.3)
Total Bilirubin: 0.51 mg/dL (ref 0.20–1.20)

## 2013-07-22 LAB — LACTATE DEHYDROGENASE (CC13): LDH: 245 U/L (ref 125–245)

## 2013-07-22 MED ORDER — IOHEXOL 300 MG/ML  SOLN
100.0000 mL | Freq: Once | INTRAMUSCULAR | Status: AC | PRN
  Administered 2013-07-22: 100 mL via INTRAVENOUS

## 2013-07-28 ENCOUNTER — Ambulatory Visit: Payer: Medicare Other

## 2013-07-28 ENCOUNTER — Other Ambulatory Visit: Payer: Self-pay | Admitting: *Deleted

## 2013-07-28 ENCOUNTER — Ambulatory Visit: Payer: Medicare Other | Admitting: Internal Medicine

## 2013-07-28 ENCOUNTER — Other Ambulatory Visit: Payer: Medicare Other

## 2013-07-29 ENCOUNTER — Telehealth: Payer: Self-pay | Admitting: Internal Medicine

## 2013-07-29 NOTE — Telephone Encounter (Signed)
lvm for pt and advised on 5.29.15 appt.Marland KitchenMarland Kitchen

## 2013-07-30 ENCOUNTER — Other Ambulatory Visit: Payer: Self-pay

## 2013-07-30 DIAGNOSIS — C8590 Non-Hodgkin lymphoma, unspecified, unspecified site: Secondary | ICD-10-CM

## 2013-07-31 ENCOUNTER — Ambulatory Visit (HOSPITAL_BASED_OUTPATIENT_CLINIC_OR_DEPARTMENT_OTHER): Payer: Medicare Other | Admitting: Physician Assistant

## 2013-07-31 ENCOUNTER — Ambulatory Visit (HOSPITAL_BASED_OUTPATIENT_CLINIC_OR_DEPARTMENT_OTHER): Payer: Medicare Other

## 2013-07-31 ENCOUNTER — Telehealth: Payer: Self-pay | Admitting: Internal Medicine

## 2013-07-31 ENCOUNTER — Other Ambulatory Visit: Payer: Medicare Other

## 2013-07-31 ENCOUNTER — Other Ambulatory Visit (HOSPITAL_BASED_OUTPATIENT_CLINIC_OR_DEPARTMENT_OTHER): Payer: Medicare Other

## 2013-07-31 ENCOUNTER — Ambulatory Visit: Payer: Medicare Other | Admitting: Physician Assistant

## 2013-07-31 ENCOUNTER — Other Ambulatory Visit: Payer: Self-pay | Admitting: *Deleted

## 2013-07-31 ENCOUNTER — Encounter: Payer: Self-pay | Admitting: Physician Assistant

## 2013-07-31 VITALS — BP 128/76 | HR 67 | Temp 98.2°F | Resp 18 | Ht 67.0 in | Wt 156.5 lb

## 2013-07-31 VITALS — BP 115/64 | HR 62 | Temp 97.7°F | Resp 16

## 2013-07-31 DIAGNOSIS — C8590 Non-Hodgkin lymphoma, unspecified, unspecified site: Secondary | ICD-10-CM

## 2013-07-31 DIAGNOSIS — C8589 Other specified types of non-Hodgkin lymphoma, extranodal and solid organ sites: Secondary | ICD-10-CM

## 2013-07-31 DIAGNOSIS — I700 Atherosclerosis of aorta: Secondary | ICD-10-CM

## 2013-07-31 DIAGNOSIS — I2510 Atherosclerotic heart disease of native coronary artery without angina pectoris: Secondary | ICD-10-CM

## 2013-07-31 DIAGNOSIS — R609 Edema, unspecified: Secondary | ICD-10-CM

## 2013-07-31 DIAGNOSIS — K449 Diaphragmatic hernia without obstruction or gangrene: Secondary | ICD-10-CM

## 2013-07-31 DIAGNOSIS — Z5112 Encounter for antineoplastic immunotherapy: Secondary | ICD-10-CM

## 2013-07-31 DIAGNOSIS — N400 Enlarged prostate without lower urinary tract symptoms: Secondary | ICD-10-CM

## 2013-07-31 LAB — LACTATE DEHYDROGENASE: LDH: 181 U/L (ref 94–250)

## 2013-07-31 LAB — COMPREHENSIVE METABOLIC PANEL (CC13)
ALK PHOS: 116 U/L (ref 40–150)
ALT: 14 U/L (ref 0–55)
AST: 24 U/L (ref 5–34)
Albumin: 3.9 g/dL (ref 3.5–5.0)
Anion Gap: 12 mEq/L — ABNORMAL HIGH (ref 3–11)
BUN: 15.2 mg/dL (ref 7.0–26.0)
CHLORIDE: 106 meq/L (ref 98–109)
CO2: 22 mEq/L (ref 22–29)
CREATININE: 1 mg/dL (ref 0.7–1.3)
Calcium: 9.6 mg/dL (ref 8.4–10.4)
Glucose: 80 mg/dl (ref 70–140)
Potassium: 4.3 mEq/L (ref 3.5–5.1)
Sodium: 141 mEq/L (ref 136–145)
Total Bilirubin: 0.64 mg/dL (ref 0.20–1.20)
Total Protein: 6.6 g/dL (ref 6.4–8.3)

## 2013-07-31 LAB — CBC WITH DIFFERENTIAL/PLATELET
BASO%: 0.5 % (ref 0.0–2.0)
BASOS ABS: 0 10*3/uL (ref 0.0–0.1)
EOS%: 3.6 % (ref 0.0–7.0)
Eosinophils Absolute: 0.2 10*3/uL (ref 0.0–0.5)
HCT: 41.6 % (ref 38.4–49.9)
HGB: 14.3 g/dL (ref 13.0–17.1)
LYMPH%: 29.8 % (ref 14.0–49.0)
MCH: 31 pg (ref 27.2–33.4)
MCHC: 34.4 g/dL (ref 32.0–36.0)
MCV: 90 fL (ref 79.3–98.0)
MONO#: 0.8 10*3/uL (ref 0.1–0.9)
MONO%: 18 % — AB (ref 0.0–14.0)
NEUT#: 2 10*3/uL (ref 1.5–6.5)
NEUT%: 48.1 % (ref 39.0–75.0)
PLATELETS: 227 10*3/uL (ref 140–400)
RBC: 4.62 10*6/uL (ref 4.20–5.82)
RDW: 13.3 % (ref 11.0–14.6)
WBC: 4.2 10*3/uL (ref 4.0–10.3)
lymph#: 1.2 10*3/uL (ref 0.9–3.3)

## 2013-07-31 MED ORDER — SODIUM CHLORIDE 0.9 % IV SOLN
375.0000 mg/m2 | Freq: Once | INTRAVENOUS | Status: AC
  Administered 2013-07-31: 700 mg via INTRAVENOUS
  Filled 2013-07-31: qty 70

## 2013-07-31 MED ORDER — DIPHENHYDRAMINE HCL 25 MG PO CAPS
ORAL_CAPSULE | ORAL | Status: AC
  Filled 2013-07-31: qty 2

## 2013-07-31 MED ORDER — DIPHENHYDRAMINE HCL 25 MG PO CAPS
50.0000 mg | ORAL_CAPSULE | Freq: Once | ORAL | Status: AC
  Administered 2013-07-31: 50 mg via ORAL

## 2013-07-31 MED ORDER — ACETAMINOPHEN 325 MG PO TABS
650.0000 mg | ORAL_TABLET | Freq: Once | ORAL | Status: AC
  Administered 2013-07-31: 650 mg via ORAL

## 2013-07-31 MED ORDER — ACETAMINOPHEN 325 MG PO TABS
ORAL_TABLET | ORAL | Status: AC
  Filled 2013-07-31: qty 2

## 2013-07-31 MED ORDER — SODIUM CHLORIDE 0.9 % IV SOLN
Freq: Once | INTRAVENOUS | Status: AC
  Administered 2013-07-31: 11:00:00 via INTRAVENOUS

## 2013-07-31 NOTE — Telephone Encounter (Signed)
gv and printed appt sched adn avs for pt for July....sed added tx.

## 2013-07-31 NOTE — Patient Instructions (Signed)
Your restaging CT scan revealed no evidence of recurrent lymphoma. Continue with your maintenance Rituxan as scheduled Followup in 2 months

## 2013-07-31 NOTE — Patient Instructions (Signed)
[facility] Discharge Instructions for Patients Receiving Chemotherapy  Today you received the following chemotherapy agents Rituxan.  To help prevent nausea and vomiting after your treatment, we encourage you to take your nausea medication as prescribed.   If you develop nausea and vomiting that is not controlled by your nausea medication, call the clinic.   BELOW ARE SYMPTOMS THAT SHOULD BE REPORTED IMMEDIATELY:  *FEVER GREATER THAN 100.5 F  *CHILLS WITH OR WITHOUT FEVER  NAUSEA AND VOMITING THAT IS NOT CONTROLLED WITH YOUR NAUSEA MEDICATION  *UNUSUAL SHORTNESS OF BREATH  *UNUSUAL BRUISING OR BLEEDING  TENDERNESS IN MOUTH AND THROAT WITH OR WITHOUT PRESENCE OF ULCERS  *URINARY PROBLEMS  *BOWEL PROBLEMS  UNUSUAL RASH Items with * indicate a potential emergency and should be followed up as soon as possible.  Feel free to call the clinic you have any questions or concerns. The clinic phone number is [phone].    

## 2013-07-31 NOTE — Progress Notes (Signed)
[facility] Telephone:([phone]   Fax:(336) [place], [place] [address]  PRINCIPAL DIAGNOSIS: Bulky stage IV low grade lymphoma diagnosed in November 2009.   PRIOR THERAPY:  1. Status post 7 cycles of systemic chemotherapy with CHOP/Rituxan. Last dose was given May 2010. 2. Status post 6 cycles of maintenance Rituxan 375 mg/sq m given every 2 months. Last dose was given on May 29, 2010, and the patient was lost to followup at that time. He received another dose on 12/11/2010, then again missed 2 doses. 3. systemic chemotherapy with Rituxan 375 mg/M2 on day 1 and that bendamustine 90 mg/M2 on days 1 and 2 every 4 weeks, status post 6 cycles.  CURRENT THERAPY: Maintenance Rituxan 375 mg/M2 every 2 months, status post 9 cycles  INTERVAL HISTORY: Eric [name] 73 y.o. male returns to the clinic today for followup visit. The patient is feeling fine today with no specific complaints. He denied having any significant chest pain, shortness of breath, cough or hemoptysis. He denied having any weight loss or night sweats. He has no lymphadenopathy. He is tolerating his treatment with maintenance rituximab fairly well with no significant adverse effects. He recently had a restaging CT scan of the chest, abdomen pelvis and presents to discuss the results as well as proceed with his next scheduled cycle of maintenance Rituxan.  MEDICAL HISTORY: Past Medical History  Diagnosis Date  . History of heart attack   . Cancer   . Non Hodgkin's lymphoma     ALLERGIES:  is allergic to neosporin.  MEDICATIONS:  Current Outpatient Prescriptions  Medication Sig Dispense Refill  . aspirin EC 325 MG EC tablet Take 1 tablet (325 mg total) by mouth daily.  30 tablet    . atorvastatin (LIPITOR) 40 MG tablet Take 1 tablet (40 mg total) by mouth daily at 6 PM.  30 tablet  3  . cyanocobalamin 1000 MCG tablet Take 100 mcg  by mouth daily.      . diphenhydrAMINE (BENADRYL) 25 MG tablet Take 25 mg by mouth at bedtime as needed. Sleep      . ferrous sulfate 325 (65 FE) MG tablet Take 325 mg by mouth daily with breakfast.      . prasugrel (EFFIENT) 10 MG TABS tablet Take 1 tablet (10 mg total) by mouth daily.  90 tablet  4  . sildenafil (VIAGRA) 25 MG tablet Take 2-3 tablets once a day as directed.  30 tablet  1  . zolpidem (AMBIEN) 10 MG tablet TAKE 1 TABLET BY MOUTH AT BEDTIME AS NEEDED FOR SLEEP  30 tablet  2  . nitroGLYCERIN (NITROSTAT) 0.4 MG SL tablet Place 1 tablet (0.4 mg total) under the tongue every 5 (five) minutes x 3 doses as needed for chest pain.  25 tablet  3   No current facility-administered medications for this visit.    SURGICAL HISTORY:  Past Surgical History  Procedure Laterality Date  . Coronary stent placement      REVIEW OF SYSTEMS:  Constitutional: negative Eyes: negative Ears, nose, mouth, throat, and face: negative Respiratory: negative Cardiovascular: negative Gastrointestinal: negative Genitourinary:negative Integument/breast: negative Hematologic/lymphatic: negative Musculoskeletal:negative Neurological: negative Behavioral/Psych: negative Endocrine: negative Allergic/Immunologic: negative   PHYSICAL EXAMINATION: General appearance: alert, cooperative and no distress Head: Normocephalic, without obvious abnormality, atraumatic Neck: no adenopathy Lymph nodes: Cervical, supraclavicular, and axillary nodes normal. Resp: clear to auscultation bilaterally Back: symmetric, no curvature. ROM  normal. No CVA tenderness. Cardio: regular rate and rhythm, S1, S2 normal, no murmur, click, rub or gallop GI: soft, non-tender; bowel sounds normal; no masses,  no organomegaly Extremities: extremities normal, atraumatic, no cyanosis or edema Neurologic: Alert and oriented X 3, normal strength and tone. Normal symmetric reflexes. Normal coordination and gait  ECOG PERFORMANCE STATUS:  0 - Asymptomatic  Blood pressure 128/76, pulse 67, temperature 98.2 F (36.8 C), temperature source Oral, resp. rate 18, height 5\' 7"  (1.702 m), weight 156 lb 8 oz (70.988 kg), SpO2 98.00%.  LABORATORY DATA: Lab Results  Component Value Date   WBC 4.2 07/31/2013   HGB 14.3 07/31/2013   HCT 41.6 07/31/2013   MCV 90.0 07/31/2013   PLT 227 07/31/2013      Chemistry      Component Value Date/Time   NA 141 07/31/2013 0931   NA 142 03/05/2012 0545   NA 141 03/23/2010 1301   K 4.3 07/31/2013 0931   K 3.3* 03/05/2012 0545   K 4.7 03/23/2010 1301   CL 106 08/06/2012 1244   CL 107 03/05/2012 0545   CL 99 03/23/2010 1301   CO2 22 07/31/2013 0931   CO2 23 03/05/2012 0545   CO2 26 03/23/2010 1301   BUN 15.2 07/31/2013 0931   BUN 9 03/05/2012 0545   BUN 12 03/23/2010 1301   CREATININE 1.0 07/31/2013 0931   CREATININE 0.92 03/05/2012 0545   CREATININE 1.0 03/23/2010 1301      Component Value Date/Time   CALCIUM 9.6 07/31/2013 0931   CALCIUM 8.5 03/05/2012 0545   CALCIUM 9.1 03/23/2010 1301   ALKPHOS 116 07/31/2013 0931   ALKPHOS 86 03/03/2012 0030   ALKPHOS 78 03/23/2010 1301   AST 24 07/31/2013 0931   AST 97* 03/03/2012 0030   AST 25 03/23/2010 1301   ALT 14 07/31/2013 0931   ALT 25 03/03/2012 0030   ALT 20 03/23/2010 1301   BILITOT 0.64 07/31/2013 0931   BILITOT 0.2* 03/03/2012 0030   BILITOT 0.60 03/23/2010 1301       RADIOGRAPHIC STUDIES: Ct Chest W Contrast  07/22/2013   CLINICAL DATA:  Non-Hodgkin's lymphoma.  EXAM: CT CHEST, ABDOMEN, AND PELVIS WITH CONTRAST  TECHNIQUE: Multidetector CT imaging of the chest, abdomen and pelvis was performed following the standard protocol during bolus administration of intravenous contrast.  CONTRAST:  145mL OMNIPAQUE IOHEXOL 300 MG/ML  SOLN  COMPARISON:  02/02/2013  FINDINGS: CT CHEST FINDINGS  The chest wall is unremarkable and stable. No supraclavicular or axillary mass or adenopathy. Small scattered lymph nodes are stable. Stable surgical clips in the right axilla. The  thyroid gland is normal. The bony thorax is intact.  The heart is normal in size. No pericardial effusion. No mediastinal or hilar mass or adenopathy. The aorta is normal in caliber. No dissection. Minimal scattered atherosclerotic calcifications. Stable three-vessel coronary artery calcifications.  There is persistent distal esophageal wall thickening and submucosal edema. Findings could be due to chronic reflux esophagitis as there is a small hiatal hernia. Endoscopic evaluation may be useful for further evaluation.  Examination of the lung parenchyma demonstrates no acute pulmonary findings. No infiltrates, edema or effusions. No pulmonary nodules or masses.  CT ABDOMEN AND PELVIS FINDINGS  Mild diffuse fatty infiltration of the liver but no focal hepatic lesions or intrahepatic biliary dilatation. The gallbladder is normal. No common bowel duct dilatation. The pancreas is normal. The spleen is normal. The adrenal glands and kidneys are unremarkable and stable. There are bilateral parapelvic  and renal cysts.  The stomach, duodenum, small bowel and colon are unremarkable. No inflammatory changes, mass lesions or obstructive findings. Moderate sigmoid diverticulosis is noted. No mesenteric or retroperitoneal mass or lymphadenopathy. The aorta and branch vessels are patent. Advanced atherosclerotic calcifications are noted. The major venous structures are patent.  The bladder is unremarkable. Prostate gland is markedly enlarged. No pelvic mass or adenopathy. No free pelvic fluid collections. No inguinal mass or adenopathy.  The bony structures are unremarkable.  IMPRESSION: 1. No findings for recurrent lymphoma in the chest, abdomen or pelvis. 2. Stable markedly enlarged prostate gland. 3. Moderate size hiatal hernia with distal esophageal wall thickening and submucosal edema which could reflect chronic reflux esophagitis. Endoscopic evaluation may be helpful for further evaluation. 4. Stable three-vessel coronary  artery calcifications. 5. Stable advanced atherosclerotic calcifications involving the abdominal aorta and branch vessels.   Electronically Signed   By: Kalman Jewels M.D.   On: 07/22/2013 13:06   Ct Abdomen Pelvis W Contrast  07/22/2013   CLINICAL DATA:  Non-Hodgkin's lymphoma.  EXAM: CT CHEST, ABDOMEN, AND PELVIS WITH CONTRAST  TECHNIQUE: Multidetector CT imaging of the chest, abdomen and pelvis was performed following the standard protocol during bolus administration of intravenous contrast.  CONTRAST:  154mL OMNIPAQUE IOHEXOL 300 MG/ML  SOLN  COMPARISON:  02/02/2013  FINDINGS: CT CHEST FINDINGS  The chest wall is unremarkable and stable. No supraclavicular or axillary mass or adenopathy. Small scattered lymph nodes are stable. Stable surgical clips in the right axilla. The thyroid gland is normal. The bony thorax is intact.  The heart is normal in size. No pericardial effusion. No mediastinal or hilar mass or adenopathy. The aorta is normal in caliber. No dissection. Minimal scattered atherosclerotic calcifications. Stable three-vessel coronary artery calcifications.  There is persistent distal esophageal wall thickening and submucosal edema. Findings could be due to chronic reflux esophagitis as there is a small hiatal hernia. Endoscopic evaluation may be useful for further evaluation.  Examination of the lung parenchyma demonstrates no acute pulmonary findings. No infiltrates, edema or effusions. No pulmonary nodules or masses.  CT ABDOMEN AND PELVIS FINDINGS  Mild diffuse fatty infiltration of the liver but no focal hepatic lesions or intrahepatic biliary dilatation. The gallbladder is normal. No common bowel duct dilatation. The pancreas is normal. The spleen is normal. The adrenal glands and kidneys are unremarkable and stable. There are bilateral parapelvic and renal cysts.  The stomach, duodenum, small bowel and colon are unremarkable. No inflammatory changes, mass lesions or obstructive findings.  Moderate sigmoid diverticulosis is noted. No mesenteric or retroperitoneal mass or lymphadenopathy. The aorta and branch vessels are patent. Advanced atherosclerotic calcifications are noted. The major venous structures are patent.  The bladder is unremarkable. Prostate gland is markedly enlarged. No pelvic mass or adenopathy. No free pelvic fluid collections. No inguinal mass or adenopathy.  The bony structures are unremarkable.  IMPRESSION: 1. No findings for recurrent lymphoma in the chest, abdomen or pelvis. 2. Stable markedly enlarged prostate gland. 3. Moderate size hiatal hernia with distal esophageal wall thickening and submucosal edema which could reflect chronic reflux esophagitis. Endoscopic evaluation may be helpful for further evaluation. 4. Stable three-vessel coronary artery calcifications. 5. Stable advanced atherosclerotic calcifications involving the abdominal aorta and branch vessels.   Electronically Signed   By: Kalman Jewels M.D.   On: 07/22/2013 13:06   ASSESSMENT AND PLAN: This is a very pleasant 73 years old male with history of bulky stage IV low-grade lymphoma currently on maintenance  Rituxan status post 9 cycles. He is tolerating his treatment fairly well with no significant adverse effects.   We will proceed with cycle #10 today as scheduled. His restaging CT scan of the chest, abdomen and pelvis with contrast revealed no findings for recurrent lymphoma in the chest, abdomen or pelvis. He is a stable markedly enlarged prostate gland. There is a moderate size hiatal hernia with distal esophageal wall thickening and submucosal edema which could reflect chronic reflux esophagitis. Endoscopic evaluation may be of further help. Are stable three-vessel coronary artery calcifications as well as stable advanced atherosclerotic calcifications involving the abdominal aorta and branch vessels. The results CT scan were reviewed with the patient. He voiced understanding. He will return for a  followup visit in 2 months with the start of cycle #11. A  He was advised to call immediately if he has any concerning symptoms in the interval. The patient voices understanding of current disease status and treatment options and is in agreement with the current care plan.  All questions were answered. The patient knows to call the clinic with any problems, questions or concerns. We can certainly see the patient much sooner if necessary.  Carlton Adam, PA-C   Disclaimer: This note was dictated with voice recognition software. Similar sounding words can inadvertently be transcribed and may not be corrected upon review.

## 2013-08-24 ENCOUNTER — Encounter: Payer: Self-pay | Admitting: Gastroenterology

## 2013-09-25 ENCOUNTER — Other Ambulatory Visit: Payer: Medicare Other

## 2013-09-25 ENCOUNTER — Ambulatory Visit: Payer: Medicare Other | Admitting: Physician Assistant

## 2013-09-25 ENCOUNTER — Ambulatory Visit: Payer: Medicare Other

## 2013-09-29 ENCOUNTER — Ambulatory Visit (HOSPITAL_BASED_OUTPATIENT_CLINIC_OR_DEPARTMENT_OTHER): Payer: Medicare Other | Admitting: Internal Medicine

## 2013-09-29 ENCOUNTER — Telehealth: Payer: Self-pay | Admitting: Internal Medicine

## 2013-09-29 ENCOUNTER — Ambulatory Visit (HOSPITAL_BASED_OUTPATIENT_CLINIC_OR_DEPARTMENT_OTHER): Payer: Medicare Other

## 2013-09-29 ENCOUNTER — Other Ambulatory Visit (HOSPITAL_BASED_OUTPATIENT_CLINIC_OR_DEPARTMENT_OTHER): Payer: Medicare Other

## 2013-09-29 ENCOUNTER — Encounter: Payer: Self-pay | Admitting: Internal Medicine

## 2013-09-29 VITALS — BP 120/60 | HR 63 | Temp 97.8°F | Resp 18

## 2013-09-29 VITALS — BP 119/73 | HR 69 | Temp 98.0°F | Resp 18 | Ht 67.0 in | Wt 153.4 lb

## 2013-09-29 DIAGNOSIS — C8589 Other specified types of non-Hodgkin lymphoma, extranodal and solid organ sites: Secondary | ICD-10-CM

## 2013-09-29 DIAGNOSIS — C8590 Non-Hodgkin lymphoma, unspecified, unspecified site: Secondary | ICD-10-CM

## 2013-09-29 DIAGNOSIS — Z5112 Encounter for antineoplastic immunotherapy: Secondary | ICD-10-CM

## 2013-09-29 LAB — CBC WITH DIFFERENTIAL/PLATELET
BASO%: 1.1 % (ref 0.0–2.0)
Basophils Absolute: 0.1 10*3/uL (ref 0.0–0.1)
EOS ABS: 0.4 10*3/uL (ref 0.0–0.5)
EOS%: 7.1 % — AB (ref 0.0–7.0)
HCT: 41.8 % (ref 38.4–49.9)
HGB: 14.1 g/dL (ref 13.0–17.1)
LYMPH%: 34.6 % (ref 14.0–49.0)
MCH: 30.7 pg (ref 27.2–33.4)
MCHC: 33.7 g/dL (ref 32.0–36.0)
MCV: 91.1 fL (ref 79.3–98.0)
MONO#: 0.8 10*3/uL (ref 0.1–0.9)
MONO%: 15.3 % — AB (ref 0.0–14.0)
NEUT%: 41.9 % (ref 39.0–75.0)
NEUTROS ABS: 2.1 10*3/uL (ref 1.5–6.5)
Platelets: 250 10*3/uL (ref 140–400)
RBC: 4.59 10*6/uL (ref 4.20–5.82)
RDW: 13.6 % (ref 11.0–14.6)
WBC: 4.9 10*3/uL (ref 4.0–10.3)
lymph#: 1.7 10*3/uL (ref 0.9–3.3)

## 2013-09-29 LAB — COMPREHENSIVE METABOLIC PANEL (CC13)
ALBUMIN: 3.7 g/dL (ref 3.5–5.0)
ALK PHOS: 99 U/L (ref 40–150)
ALT: 20 U/L (ref 0–55)
AST: 29 U/L (ref 5–34)
Anion Gap: 12 mEq/L — ABNORMAL HIGH (ref 3–11)
BUN: 16.3 mg/dL (ref 7.0–26.0)
CO2: 23 mEq/L (ref 22–29)
Calcium: 9.1 mg/dL (ref 8.4–10.4)
Chloride: 106 mEq/L (ref 98–109)
Creatinine: 1.1 mg/dL (ref 0.7–1.3)
GLUCOSE: 84 mg/dL (ref 70–140)
Potassium: 4.2 mEq/L (ref 3.5–5.1)
SODIUM: 141 meq/L (ref 136–145)
TOTAL PROTEIN: 6.4 g/dL (ref 6.4–8.3)
Total Bilirubin: 0.44 mg/dL (ref 0.20–1.20)

## 2013-09-29 LAB — LACTATE DEHYDROGENASE (CC13): LDH: 215 U/L (ref 125–245)

## 2013-09-29 MED ORDER — ACETAMINOPHEN 325 MG PO TABS
650.0000 mg | ORAL_TABLET | Freq: Once | ORAL | Status: AC
  Administered 2013-09-29: 650 mg via ORAL

## 2013-09-29 MED ORDER — ACETAMINOPHEN 325 MG PO TABS
ORAL_TABLET | ORAL | Status: AC
  Filled 2013-09-29: qty 2

## 2013-09-29 MED ORDER — SODIUM CHLORIDE 0.9 % IV SOLN
Freq: Once | INTRAVENOUS | Status: AC
  Administered 2013-09-29: 12:00:00 via INTRAVENOUS

## 2013-09-29 MED ORDER — DIPHENHYDRAMINE HCL 25 MG PO CAPS
50.0000 mg | ORAL_CAPSULE | Freq: Once | ORAL | Status: AC
  Administered 2013-09-29: 50 mg via ORAL

## 2013-09-29 MED ORDER — DIPHENHYDRAMINE HCL 25 MG PO CAPS
ORAL_CAPSULE | ORAL | Status: AC
  Filled 2013-09-29: qty 2

## 2013-09-29 MED ORDER — SODIUM CHLORIDE 0.9 % IV SOLN
375.0000 mg/m2 | Freq: Once | INTRAVENOUS | Status: AC
  Administered 2013-09-29: 700 mg via INTRAVENOUS
  Filled 2013-09-29: qty 70

## 2013-09-29 NOTE — Telephone Encounter (Signed)
Pt confirmed labs/ov per 07/28 POF, gave pt AVS....KJ

## 2013-09-29 NOTE — Progress Notes (Signed)
[facility] [phone]   Fax:(336) [place], [place] [address]  PRINCIPAL DIAGNOSIS: Bulky stage IV low grade lymphoma diagnosed in November 2009.   PRIOR THERAPY:  1. Status post 7 cycles of systemic chemotherapy with CHOP/Rituxan. Last dose was given May 2010. 2. Status post 6 cycles of maintenance Rituxan 375 mg/sq m given every 2 months. Last dose was given on May 29, 2010, and the patient was lost to followup at that time. He received another dose on 12/11/2010, then again missed 2 doses. 3. systemic chemotherapy with Rituxan 375 mg/M2 on day 1 and that bendamustine 90 mg/M2 on days 1 and 2 every 4 weeks, status post 6 cycles.  CURRENT THERAPY: Maintenance Rituxan 375 mg/M2 every 2 months, status post 10 cycles  INTERVAL HISTORY: Eric [name] 73 y.o. male returns to the clinic today for two-month followup visit. The patient is feeling fine today with no specific complaints. He denied having any significant chest pain, shortness of breath, cough or hemoptysis. He denied having any weight loss or night sweats. He has no fever or chills, no nausea or vomiting. He has no lymphadenopathy. He is tolerating his treatment with maintenance rituximab fairly well with no significant adverse effects.   MEDICAL HISTORY: Past Medical History  Diagnosis Date  . History of heart attack   . Cancer   . Non Hodgkin's lymphoma     ALLERGIES:  is allergic to neosporin.  MEDICATIONS:  Current Outpatient Prescriptions  Medication Sig Dispense Refill  . aspirin EC 325 MG EC tablet Take 1 tablet (325 mg total) by mouth daily.  30 tablet    . atorvastatin (LIPITOR) 40 MG tablet Take 1 tablet (40 mg total) by mouth daily at 6 PM.  30 tablet  3  . cyanocobalamin 1000 MCG tablet Take 100 mcg by mouth daily.      . ferrous sulfate 325 (65 FE) MG tablet Take 325 mg by mouth daily with breakfast.      .  prasugrel (EFFIENT) 10 MG TABS tablet Take 1 tablet (10 mg total) by mouth daily.  90 tablet  4  . diphenhydrAMINE (BENADRYL) 25 MG tablet Take 25 mg by mouth at bedtime as needed. Sleep      . nitroGLYCERIN (NITROSTAT) 0.4 MG SL tablet Place 1 tablet (0.4 mg total) under the tongue every 5 (five) minutes x 3 doses as needed for chest pain.  25 tablet  3  . sildenafil (VIAGRA) 25 MG tablet Take 2-3 tablets once a day as directed.  30 tablet  1   No current facility-administered medications for this visit.    SURGICAL HISTORY:  Past Surgical History  Procedure Laterality Date  . Coronary stent placement      REVIEW OF SYSTEMS:  Constitutional: negative Eyes: negative Ears, nose, mouth, throat, and face: negative Respiratory: negative Cardiovascular: negative Gastrointestinal: negative Genitourinary:negative Integument/breast: negative Hematologic/lymphatic: negative Musculoskeletal:negative Neurological: negative Behavioral/Psych: negative Endocrine: negative Allergic/Immunologic: negative   PHYSICAL EXAMINATION: General appearance: alert, cooperative and no distress Head: Normocephalic, without obvious abnormality, atraumatic Neck: no adenopathy Lymph nodes: Cervical, supraclavicular, and axillary nodes normal. Resp: clear to auscultation bilaterally Back: symmetric, no curvature. ROM normal. No CVA tenderness. Cardio: regular rate and rhythm, S1, S2 normal, no murmur, click, rub or gallop GI: soft, non-tender; bowel sounds normal; no masses,  no organomegaly Extremities: extremities normal, atraumatic, no cyanosis or edema Neurologic: Alert and oriented  X 3, normal strength and tone. Normal symmetric reflexes. Normal coordination and gait  ECOG PERFORMANCE STATUS: 0 - Asymptomatic  Blood pressure 119/73, pulse 69, temperature 98 F (36.7 C), temperature source Oral, resp. rate 18, height 5\' 7"  (1.702 m), weight 153 lb 6.4 oz (69.582 kg).  LABORATORY DATA: Lab Results    Component Value Date   WBC 4.9 09/29/2013   HGB 14.1 09/29/2013   HCT 41.8 09/29/2013   MCV 91.1 09/29/2013   PLT 250 09/29/2013      Chemistry      Component Value Date/Time   NA 141 07/31/2013 0931   NA 142 03/05/2012 0545   NA 141 03/23/2010 1301   K 4.3 07/31/2013 0931   K 3.3* 03/05/2012 0545   K 4.7 03/23/2010 1301   CL 106 08/06/2012 1244   CL 107 03/05/2012 0545   CL 99 03/23/2010 1301   CO2 22 07/31/2013 0931   CO2 23 03/05/2012 0545   CO2 26 03/23/2010 1301   BUN 15.2 07/31/2013 0931   BUN 9 03/05/2012 0545   BUN 12 03/23/2010 1301   CREATININE 1.0 07/31/2013 0931   CREATININE 0.92 03/05/2012 0545   CREATININE 1.0 03/23/2010 1301      Component Value Date/Time   CALCIUM 9.6 07/31/2013 0931   CALCIUM 8.5 03/05/2012 0545   CALCIUM 9.1 03/23/2010 1301   ALKPHOS 116 07/31/2013 0931   ALKPHOS 86 03/03/2012 0030   ALKPHOS 78 03/23/2010 1301   AST 24 07/31/2013 0931   AST 97* 03/03/2012 0030   AST 25 03/23/2010 1301   ALT 14 07/31/2013 0931   ALT 25 03/03/2012 0030   ALT 20 03/23/2010 1301   BILITOT 0.64 07/31/2013 0931   BILITOT 0.2* 03/03/2012 0030   BILITOT 0.60 03/23/2010 1301       RADIOGRAPHIC STUDIES:  ASSESSMENT AND PLAN: This is a very pleasant 73 years old male with history of bulky stage IV low-grade lymphoma currently on maintenance Rituxan status post 10 cycles. He is tolerating his treatment fairly well with no significant adverse effects.   We will proceed with cycle #11 today as scheduled. The patient would come back for followup visit in 2 months with the start of cycle #12 after repeating CT scan of the chest, abdomen and pelvis for restaging of his disease. He was advised to call immediately if he has any concerning symptoms in the interval. The patient voices understanding of current disease status and treatment options and is in agreement with the current care plan.  All questions were answered. The patient knows to call the clinic with any problems, questions or concerns.  We can certainly see the patient much sooner if necessary.  Disclaimer: This note was dictated with voice recognition software. Similar sounding words can inadvertently be transcribed and may not be corrected upon review.

## 2013-09-29 NOTE — Patient Instructions (Signed)
[facility] Discharge Instructions for Patients Receiving Chemotherapy  Today you received the following chemotherapy agents :  Rituxan.  To help prevent nausea and vomiting after your treatment, we encourage you to take your nausea medication as prescribed by your physician.   If you develop nausea and vomiting that is not controlled by your nausea medication, call the clinic.   BELOW ARE SYMPTOMS THAT SHOULD BE REPORTED IMMEDIATELY:  *FEVER GREATER THAN 100.5 F  *CHILLS WITH OR WITHOUT FEVER  NAUSEA AND VOMITING THAT IS NOT CONTROLLED WITH YOUR NAUSEA MEDICATION  *UNUSUAL SHORTNESS OF BREATH  *UNUSUAL BRUISING OR BLEEDING  TENDERNESS IN MOUTH AND THROAT WITH OR WITHOUT PRESENCE OF ULCERS  *URINARY PROBLEMS  *BOWEL PROBLEMS  UNUSUAL RASH Items with * indicate a potential emergency and should be followed up as soon as possible.  Feel free to call the clinic you have any questions or concerns. The clinic phone number is [phone].

## 2013-11-06 ENCOUNTER — Encounter: Payer: Self-pay | Admitting: *Deleted

## 2013-12-01 ENCOUNTER — Telehealth: Payer: Self-pay | Admitting: Internal Medicine

## 2013-12-01 ENCOUNTER — Encounter: Payer: Self-pay | Admitting: Internal Medicine

## 2013-12-01 ENCOUNTER — Ambulatory Visit (HOSPITAL_BASED_OUTPATIENT_CLINIC_OR_DEPARTMENT_OTHER): Payer: Medicare Other | Admitting: Internal Medicine

## 2013-12-01 ENCOUNTER — Other Ambulatory Visit (HOSPITAL_BASED_OUTPATIENT_CLINIC_OR_DEPARTMENT_OTHER): Payer: Medicare Other

## 2013-12-01 VITALS — BP 128/86 | HR 76 | Temp 98.1°F | Resp 20 | Ht 67.0 in | Wt 155.1 lb

## 2013-12-01 DIAGNOSIS — C8589 Other specified types of non-Hodgkin lymphoma, extranodal and solid organ sites: Secondary | ICD-10-CM

## 2013-12-01 DIAGNOSIS — C8590 Non-Hodgkin lymphoma, unspecified, unspecified site: Secondary | ICD-10-CM

## 2013-12-01 DIAGNOSIS — G4700 Insomnia, unspecified: Secondary | ICD-10-CM

## 2013-12-01 LAB — COMPREHENSIVE METABOLIC PANEL (CC13)
ALK PHOS: 108 U/L (ref 40–150)
ALT: 16 U/L (ref 0–55)
AST: 22 U/L (ref 5–34)
Albumin: 4 g/dL (ref 3.5–5.0)
Anion Gap: 6 mEq/L (ref 3–11)
BILIRUBIN TOTAL: 0.53 mg/dL (ref 0.20–1.20)
BUN: 16.4 mg/dL (ref 7.0–26.0)
CO2: 28 mEq/L (ref 22–29)
Calcium: 9.9 mg/dL (ref 8.4–10.4)
Chloride: 107 mEq/L (ref 98–109)
Creatinine: 1.3 mg/dL (ref 0.7–1.3)
Glucose: 90 mg/dl (ref 70–140)
POTASSIUM: 4.9 meq/L (ref 3.5–5.1)
Sodium: 141 mEq/L (ref 136–145)
Total Protein: 7 g/dL (ref 6.4–8.3)

## 2013-12-01 LAB — CBC WITH DIFFERENTIAL/PLATELET
BASO%: 0.4 % (ref 0.0–2.0)
BASOS ABS: 0 10*3/uL (ref 0.0–0.1)
EOS%: 4.7 % (ref 0.0–7.0)
Eosinophils Absolute: 0.2 10*3/uL (ref 0.0–0.5)
HCT: 45.6 % (ref 38.4–49.9)
HGB: 15.3 g/dL (ref 13.0–17.1)
LYMPH%: 34.4 % (ref 14.0–49.0)
MCH: 30.7 pg (ref 27.2–33.4)
MCHC: 33.6 g/dL (ref 32.0–36.0)
MCV: 91.4 fL (ref 79.3–98.0)
MONO#: 0.9 10*3/uL (ref 0.1–0.9)
MONO%: 16.9 % — AB (ref 0.0–14.0)
NEUT#: 2.2 10*3/uL (ref 1.5–6.5)
NEUT%: 43.6 % (ref 39.0–75.0)
Platelets: 218 10*3/uL (ref 140–400)
RBC: 4.99 10*6/uL (ref 4.20–5.82)
RDW: 13.6 % (ref 11.0–14.6)
WBC: 5.1 10*3/uL (ref 4.0–10.3)
lymph#: 1.8 10*3/uL (ref 0.9–3.3)

## 2013-12-01 LAB — LACTATE DEHYDROGENASE (CC13): LDH: 223 U/L (ref 125–245)

## 2013-12-01 MED ORDER — ZOLPIDEM TARTRATE 5 MG PO TABS: 5.0000 mg | ORAL_TABLET | Freq: Every evening | ORAL | Status: DC | PRN

## 2013-12-01 NOTE — Progress Notes (Signed)
[facility] [phone]   Fax:(336) [place], [place] [address]  PRINCIPAL DIAGNOSIS: Bulky stage IV low grade lymphoma diagnosed in November 2009.   PRIOR THERAPY:  1. Status post 7 cycles of systemic chemotherapy with CHOP/Rituxan. Last dose was given May 2010. 2. Status post 6 cycles of maintenance Rituxan 375 mg/sq m given every 2 months. Last dose was given on May 29, 2010, and the patient was lost to followup at that time. He received another dose on 12/11/2010, then again missed 2 doses. 3. systemic chemotherapy with Rituxan 375 mg/M2 on day 1 and that bendamustine 90 mg/M2 on days 1 and 2 every 4 weeks, status post 6 cycles.  CURRENT THERAPY: Maintenance Rituxan 375 mg/M2 every 2 months, status post 11 cycles  INTERVAL HISTORY: Eric [name] 73 y.o. male returns to the clinic today for two-month followup visit. The patient is feeling fine today with no specific complaints. He denied having any significant chest pain, shortness of breath, cough or hemoptysis. He denied having any weight loss or night sweats. He has no fever or chills, no nausea or vomiting. He has no lymphadenopathy. He is tolerating his treatment with maintenance rituximab fairly well with no significant adverse effects. He is here today for the last cycle of his maintenance chemotherapy with Rituxan. He complains of insomnia that is not responsive to the over-the-counter medication and requested some medications to help with his insomnia.  MEDICAL HISTORY: Past Medical History  Diagnosis Date  . History of heart attack   . Cancer   . Non Hodgkin's lymphoma     ALLERGIES:  is allergic to neosporin.  MEDICATIONS:  Current Outpatient Prescriptions  Medication Sig Dispense Refill  . aspirin EC 325 MG EC tablet Take 1 tablet (325 mg total) by mouth daily.  30 tablet    . atorvastatin (LIPITOR) 40 MG tablet Take  1 tablet (40 mg total) by mouth daily at 6 PM.  30 tablet  3  . cyanocobalamin 1000 MCG tablet Take 100 mcg by mouth daily.      . diphenhydrAMINE (BENADRYL) 25 MG tablet Take 25 mg by mouth at bedtime as needed. Sleep      . ferrous sulfate 325 (65 FE) MG tablet Take 325 mg by mouth daily with breakfast.      . nitroGLYCERIN (NITROSTAT) 0.4 MG SL tablet Place 1 tablet (0.4 mg total) under the tongue every 5 (five) minutes x 3 doses as needed for chest pain.  25 tablet  3  . prasugrel (EFFIENT) 10 MG TABS tablet Take 1 tablet (10 mg total) by mouth daily.  90 tablet  4  . sildenafil (VIAGRA) 25 MG tablet Take 2-3 tablets once a day as directed.  30 tablet  1   No current facility-administered medications for this visit.    SURGICAL HISTORY:  Past Surgical History  Procedure Laterality Date  . Coronary stent placement      REVIEW OF SYSTEMS:  Constitutional: negative Eyes: negative Ears, nose, mouth, throat, and face: negative Respiratory: negative Cardiovascular: negative Gastrointestinal: negative Genitourinary:negative Integument/breast: negative Hematologic/lymphatic: negative Musculoskeletal:negative Neurological: negative Behavioral/Psych: negative Endocrine: negative Allergic/Immunologic: negative   PHYSICAL EXAMINATION: General appearance: alert, cooperative and no distress Head: Normocephalic, without obvious abnormality, atraumatic Neck: no adenopathy Lymph nodes: Cervical, supraclavicular, and axillary nodes normal. Resp: clear to auscultation bilaterally Back: symmetric, no curvature. ROM normal. No CVA tenderness. Cardio: regular rate  and rhythm, S1, S2 normal, no murmur, click, rub or gallop GI: soft, non-tender; bowel sounds normal; no masses,  no organomegaly Extremities: extremities normal, atraumatic, no cyanosis or edema Neurologic: Alert and oriented X 3, normal strength and tone. Normal symmetric reflexes. Normal coordination and gait  ECOG PERFORMANCE  STATUS: 0 - Asymptomatic  Blood pressure 128/86, pulse 76, temperature 98.1 F (36.7 C), temperature source Oral, resp. rate 20, height 5\' 7"  (1.702 m), weight 155 lb 1.6 oz (70.353 kg).  LABORATORY DATA: Lab Results  Component Value Date   WBC 5.1 12/01/2013   HGB 15.3 12/01/2013   HCT 45.6 12/01/2013   MCV 91.4 12/01/2013   PLT 218 12/01/2013      Chemistry      Component Value Date/Time   NA 141 09/29/2013 0915   NA 142 03/05/2012 0545   NA 141 03/23/2010 1301   K 4.2 09/29/2013 0915   K 3.3* 03/05/2012 0545   K 4.7 03/23/2010 1301   CL 106 08/06/2012 1244   CL 107 03/05/2012 0545   CL 99 03/23/2010 1301   CO2 23 09/29/2013 0915   CO2 23 03/05/2012 0545   CO2 26 03/23/2010 1301   BUN 16.3 09/29/2013 0915   BUN 9 03/05/2012 0545   BUN 12 03/23/2010 1301   CREATININE 1.1 09/29/2013 0915   CREATININE 0.92 03/05/2012 0545   CREATININE 1.0 03/23/2010 1301      Component Value Date/Time   CALCIUM 9.1 09/29/2013 0915   CALCIUM 8.5 03/05/2012 0545   CALCIUM 9.1 03/23/2010 1301   ALKPHOS 99 09/29/2013 0915   ALKPHOS 86 03/03/2012 0030   ALKPHOS 78 03/23/2010 1301   AST 29 09/29/2013 0915   AST 97* 03/03/2012 0030   AST 25 03/23/2010 1301   ALT 20 09/29/2013 0915   ALT 25 03/03/2012 0030   ALT 20 03/23/2010 1301   BILITOT 0.44 09/29/2013 0915   BILITOT 0.2* 03/03/2012 0030   BILITOT 0.60 03/23/2010 1301       RADIOGRAPHIC STUDIES:  ASSESSMENT AND PLAN: This is a very pleasant 73 years old male with history of bulky stage IV low-grade lymphoma currently on maintenance Rituxan status post 10 cycles. He is tolerating his treatment fairly well with no significant adverse effects.  We will proceed with cycle #12 today as scheduled. The patient would come back for followup visit in 2 months after repeating CT scan of the neck, chest, abdomen and pelvis for restaging of his disease. 4 insomnia he was started on Ambien 5 mg by mouth each bedtime. He was advised to call immediately if he has any concerning  symptoms in the interval. The patient voices understanding of current disease status and treatment options and is in agreement with the current care plan.  All questions were answered. The patient knows to call the clinic with any problems, questions or concerns. We can certainly see the patient much sooner if necessary.  Disclaimer: This note was dictated with voice recognition software. Similar sounding words can inadvertently be transcribed and may not be corrected upon review.

## 2013-12-01 NOTE — Telephone Encounter (Signed)
Pt confirmed labs/ov per 09/29 POF, gave pt AVS.... KJ

## 2014-01-30 ENCOUNTER — Other Ambulatory Visit: Payer: Self-pay | Admitting: Interventional Cardiology

## 2014-02-01 NOTE — Telephone Encounter (Signed)
Please advise on refill. Patient still has not scheduled an appointment. Thanks, MI

## 2014-02-02 ENCOUNTER — Other Ambulatory Visit: Payer: Medicare Other

## 2014-02-03 ENCOUNTER — Ambulatory Visit (HOSPITAL_COMMUNITY)
Admission: RE | Admit: 2014-02-03 | Discharge: 2014-02-03 | Disposition: A | Discharge status: Home / Self Care | Payer: Medicare Other | Source: Ambulatory Visit | Attending: Internal Medicine | Admitting: Internal Medicine

## 2014-02-03 ENCOUNTER — Other Ambulatory Visit (HOSPITAL_BASED_OUTPATIENT_CLINIC_OR_DEPARTMENT_OTHER): Payer: Medicare Other

## 2014-02-03 ENCOUNTER — Encounter (HOSPITAL_COMMUNITY): Payer: Self-pay

## 2014-02-03 DIAGNOSIS — Z9221 Personal history of antineoplastic chemotherapy: Secondary | ICD-10-CM | POA: Diagnosis not present

## 2014-02-03 DIAGNOSIS — C8590 Non-Hodgkin lymphoma, unspecified, unspecified site: Secondary | ICD-10-CM | POA: Insufficient documentation

## 2014-02-03 DIAGNOSIS — C8599 Non-Hodgkin lymphoma, unspecified, extranodal and solid organ sites: Secondary | ICD-10-CM

## 2014-02-03 LAB — LACTATE DEHYDROGENASE (CC13): LDH: 223 U/L (ref 125–245)

## 2014-02-03 LAB — COMPREHENSIVE METABOLIC PANEL (CC13)
ALK PHOS: 129 U/L (ref 40–150)
ALT: 20 U/L (ref 0–55)
AST: 24 U/L (ref 5–34)
Albumin: 4 g/dL (ref 3.5–5.0)
Anion Gap: 9 mEq/L (ref 3–11)
BILIRUBIN TOTAL: 0.49 mg/dL (ref 0.20–1.20)
BUN: 12.5 mg/dL (ref 7.0–26.0)
CO2: 28 mEq/L (ref 22–29)
Calcium: 9.3 mg/dL (ref 8.4–10.4)
Chloride: 105 mEq/L (ref 98–109)
Creatinine: 1 mg/dL (ref 0.7–1.3)
Glucose: 80 mg/dl (ref 70–140)
Potassium: 4.1 mEq/L (ref 3.5–5.1)
SODIUM: 142 meq/L (ref 136–145)
TOTAL PROTEIN: 6.6 g/dL (ref 6.4–8.3)

## 2014-02-03 LAB — CBC WITH DIFFERENTIAL/PLATELET
BASO%: 0.5 % (ref 0.0–2.0)
Basophils Absolute: 0 10*3/uL (ref 0.0–0.1)
EOS ABS: 0.1 10*3/uL (ref 0.0–0.5)
EOS%: 3.3 % (ref 0.0–7.0)
HEMATOCRIT: 44.4 % (ref 38.4–49.9)
HGB: 15 g/dL (ref 13.0–17.1)
LYMPH%: 28.7 % (ref 14.0–49.0)
MCH: 30.8 pg (ref 27.2–33.4)
MCHC: 33.8 g/dL (ref 32.0–36.0)
MCV: 91.2 fL (ref 79.3–98.0)
MONO#: 0.7 10*3/uL (ref 0.1–0.9)
MONO%: 17.5 % — AB (ref 0.0–14.0)
NEUT%: 50 % (ref 39.0–75.0)
NEUTROS ABS: 2.1 10*3/uL (ref 1.5–6.5)
PLATELETS: 236 10*3/uL (ref 140–400)
RBC: 4.87 10*6/uL (ref 4.20–5.82)
RDW: 13.4 % (ref 11.0–14.6)
WBC: 4.2 10*3/uL (ref 4.0–10.3)
lymph#: 1.2 10*3/uL (ref 0.9–3.3)

## 2014-02-03 MED ORDER — IOHEXOL 300 MG/ML  SOLN
100.0000 mL | Freq: Once | INTRAMUSCULAR | Status: AC | PRN
  Administered 2014-02-03: 100 mL via INTRAVENOUS

## 2014-02-09 ENCOUNTER — Ambulatory Visit (HOSPITAL_BASED_OUTPATIENT_CLINIC_OR_DEPARTMENT_OTHER): Payer: Medicare Other

## 2014-02-09 ENCOUNTER — Telehealth: Payer: Self-pay | Admitting: Internal Medicine

## 2014-02-09 ENCOUNTER — Encounter: Payer: Self-pay | Admitting: Internal Medicine

## 2014-02-09 ENCOUNTER — Ambulatory Visit (HOSPITAL_BASED_OUTPATIENT_CLINIC_OR_DEPARTMENT_OTHER): Payer: Medicare Other | Admitting: Internal Medicine

## 2014-02-09 VITALS — BP 126/75 | HR 76 | Temp 98.3°F | Resp 18 | Ht 67.0 in | Wt 153.7 lb

## 2014-02-09 DIAGNOSIS — Z23 Encounter for immunization: Secondary | ICD-10-CM

## 2014-02-09 DIAGNOSIS — Z8572 Personal history of non-Hodgkin lymphomas: Secondary | ICD-10-CM

## 2014-02-09 DIAGNOSIS — C8590 Non-Hodgkin lymphoma, unspecified, unspecified site: Secondary | ICD-10-CM

## 2014-02-09 MED ORDER — INFLUENZA VAC SPLIT QUAD 0.5 ML IM SUSY
0.5000 mL | PREFILLED_SYRINGE | Freq: Once | INTRAMUSCULAR | Status: AC
  Administered 2014-02-09: 0.5 mL via INTRAMUSCULAR
  Filled 2014-02-09: qty 0.5

## 2014-02-09 NOTE — Progress Notes (Signed)
[facility] [phone]   Fax:(336) [place], [place] [address]  PRINCIPAL DIAGNOSIS: Bulky stage IV low grade lymphoma diagnosed in November 2009.   PRIOR THERAPY:  1. Status post 7 cycles of systemic chemotherapy with CHOP/Rituxan. Last dose was given May 2010. 2. Status post 6 cycles of maintenance Rituxan 375 mg/sq m given every 2 months. Last dose was given on May 29, 2010, and the patient was lost to followup at that time. He received another dose on 12/11/2010, then again missed 2 doses. 3. systemic chemotherapy with Rituxan 375 mg/M2 on day 1 and that bendamustine 90 mg/M2 on days 1 and 2 every 4 weeks, status post 6 cycles. 4. Maintenance Rituxan 375 mg/M2 every 2 months, status post 12 cycles   CURRENT THERAPY: Observation.  INTERVAL HISTORY: Eric [name] 73 y.o. male returns to the clinic today for followup visit. The patient is feeling fine today with no specific complaints. He denied having any significant chest pain, shortness of breath, cough or hemoptysis. He denied having any weight loss or night sweats. He has no fever or chills, no nausea or vomiting. He has no lymphadenopathy. He completed treatment with maintenance rituximab fairly well with no significant adverse effects. She had repeat CT scan of the chest, abdomen and pelvis performed recently and he is here for evaluation and discussion of his scan results.  MEDICAL HISTORY: Past Medical History  Diagnosis Date  . History of heart attack   . Cancer   . Non Hodgkin's lymphoma     ALLERGIES:  is allergic to neosporin.  MEDICATIONS:  Current Outpatient Prescriptions  Medication Sig Dispense Refill  . aspirin EC 325 MG EC tablet Take 1 tablet (325 mg total) by mouth daily. 30 tablet   . atorvastatin (LIPITOR) 40 MG tablet TAKE 1 TABLET (40 MG TOTAL) BY MOUTH DAILY AT 6 PM. 30 tablet 0  . cyanocobalamin 1000  MCG tablet Take 100 mcg by mouth daily.    . diphenhydrAMINE (BENADRYL) 25 MG tablet Take 25 mg by mouth at bedtime as needed. Sleep    . ferrous sulfate 325 (65 FE) MG tablet Take 325 mg by mouth daily with breakfast.    . nitroGLYCERIN (NITROSTAT) 0.4 MG SL tablet Place 1 tablet (0.4 mg total) under the tongue every 5 (five) minutes x 3 doses as needed for chest pain. 25 tablet 3  . prasugrel (EFFIENT) 10 MG TABS tablet Take 1 tablet (10 mg total) by mouth daily. 90 tablet 4  . sildenafil (VIAGRA) 25 MG tablet Take 2-3 tablets once a day as directed. 30 tablet 1  . zolpidem (AMBIEN) 5 MG tablet Take 1 tablet (5 mg total) by mouth at bedtime as needed for sleep. 30 tablet 0   No current facility-administered medications for this visit.    SURGICAL HISTORY:  Past Surgical History  Procedure Laterality Date  . Coronary stent placement      REVIEW OF SYSTEMS:  Constitutional: negative Eyes: negative Ears, nose, mouth, throat, and face: negative Respiratory: negative Cardiovascular: negative Gastrointestinal: negative Genitourinary:negative Integument/breast: negative Hematologic/lymphatic: negative Musculoskeletal:negative Neurological: negative Behavioral/Psych: negative Endocrine: negative Allergic/Immunologic: negative   PHYSICAL EXAMINATION: General appearance: alert, cooperative and no distress Head: Normocephalic, without obvious abnormality, atraumatic Neck: no adenopathy Lymph nodes: Cervical, supraclavicular, and axillary nodes normal. Resp: clear to auscultation bilaterally Back: symmetric, no curvature. ROM normal. No CVA tenderness. Cardio: regular rate and rhythm,  S1, S2 normal, no murmur, click, rub or gallop GI: soft, non-tender; bowel sounds normal; no masses,  no organomegaly Extremities: extremities normal, atraumatic, no cyanosis or edema Neurologic: Alert and oriented X 3, normal strength and tone. Normal symmetric reflexes. Normal coordination and  gait  ECOG PERFORMANCE STATUS: 0 - Asymptomatic  Blood pressure 126/75, pulse 76, temperature 98.3 F (36.8 C), temperature source Oral, resp. rate 18, height 5\' 7"  (1.702 m), weight 153 lb 11.2 oz (69.718 kg), SpO2 100 %.  LABORATORY DATA: Lab Results  Component Value Date   WBC 4.2 02/03/2014   HGB 15.0 02/03/2014   HCT 44.4 02/03/2014   MCV 91.2 02/03/2014   PLT 236 02/03/2014      Chemistry      Component Value Date/Time   NA 142 02/03/2014 0926   NA 142 03/05/2012 0545   NA 141 03/23/2010 1301   K 4.1 02/03/2014 0926   K 3.3* 03/05/2012 0545   K 4.7 03/23/2010 1301   CL 106 08/06/2012 1244   CL 107 03/05/2012 0545   CL 99 03/23/2010 1301   CO2 28 02/03/2014 0926   CO2 23 03/05/2012 0545   CO2 26 03/23/2010 1301   BUN 12.5 02/03/2014 0926   BUN 9 03/05/2012 0545   BUN 12 03/23/2010 1301   CREATININE 1.0 02/03/2014 0926   CREATININE 0.92 03/05/2012 0545   CREATININE 1.0 03/23/2010 1301      Component Value Date/Time   CALCIUM 9.3 02/03/2014 0926   CALCIUM 8.5 03/05/2012 0545   CALCIUM 9.1 03/23/2010 1301   ALKPHOS 129 02/03/2014 0926   ALKPHOS 86 03/03/2012 0030   ALKPHOS 78 03/23/2010 1301   AST 24 02/03/2014 0926   AST 97* 03/03/2012 0030   AST 25 03/23/2010 1301   ALT 20 02/03/2014 0926   ALT 25 03/03/2012 0030   ALT 20 03/23/2010 1301   BILITOT 0.49 02/03/2014 0926   BILITOT 0.2* 03/03/2012 0030   BILITOT 0.60 03/23/2010 1301       RADIOGRAPHIC STUDIES:  ASSESSMENT AND PLAN: This is a very pleasant 73 years old male with history of bulky stage IV low-grade lymphoma completed maintenance Rituxan status post 12 cycles. The recent CT scan of the chest, abdomen and pelvis showed no significant evidence for lymphoma recurrence. I discussed the scan results with the patient today. I recommended for him to continue on observation with repeat CT scan of the chest, abdomen and pelvis in 6 months for restaging of his disease. For the coronary artery  disease seen on the recent scan, I advised the patient to contact his cardiologist for reevaluation. He was advised to call immediately if he has any concerning symptoms in the interval. The patient voices understanding of current disease status and treatment options and is in agreement with the current care plan.  All questions were answered. The patient knows to call the clinic with any problems, questions or concerns. We can certainly see the patient much sooner if necessary.  Disclaimer: This note was dictated with voice recognition software. Similar sounding words can inadvertently be transcribed and may not be corrected upon review.

## 2014-02-09 NOTE — Telephone Encounter (Signed)
Gave avs & cal for May/June 2016.Marland Kitchen Also gave pt contrast for Ct scan.

## 2014-02-09 NOTE — Patient Instructions (Signed)
Avian Influenza Viruses Avian influenza or "bird flu" is also known as the H5N1 virus. It occurs naturally in wild and domestic birds. Bird flu is easily spread (contagious) among birds and is deadly to them. Though rare, bird flu can cause disease in humans.  CAUSES  Infected birds can shed the H5N1 virus through:   Feces.  Nasal secretions.  Saliva. Birds become infected when they come into contact with infected birds or contaminated surfaces. The bird flu virus is spread from country to country through international poultry trade or by migrating birds.  MODES OF TRANSMISSION TO HUMANS The bird flu virus does not normally infect humans. However, the virus can infect humans who have contact with infected birds, breathe in dust or touch surfaces contaminated with the virus. Human-to-human transmission of the H5N1 virus has been rare. The virus lacks the ability to grow itself (replicate) in humans. However, because all influenza viruses can mutate, scientists are concerned the H5N1 virus will someday replicate itself and make human-to-human transmission easier. If this happens, an influenza "pandemic" could occur.  SYMPTOMS   Symptoms of H5N1 virus are similar to other influenza viruses:  Fever.  Cough.  Sore throat.  Nausea and vomiting.  Diarrhea.  Muscle aches.  Tiredness (malaise).  Some people may get inflammation or redness of the eyes (conjunctivitis).  Life-threatening complications may result in the death of the patient. These include:  Viral pneumonia.  Breathing (respiratory) distress syndrome.  Multi-organ failure. DIAGNOSIS   A person with a respiratory illness may be suffering from bird flu if direct or indirect contact has been made with infected birds. This includes handling or taking care of sick birds. The H5N1 virus may also be suspected if a person has breathed in particles or touched surfaces contaminated with the virus.  In addition to the above  symptoms, a chest X-ray is useful to detect pneumonia.  Fluid specimens such as a sputum sample may be sent to a laboratory for further investigation.  Blood tests may be done to help detect the H5N1 virus. TREATMENT   The H5N1 virus has shown resistance to amantadine and rimantadine, which are two antiviral drugs commonly used for other influenza viruses. However, two other antivirals, oseltamivir and zanamivir, seem to be effective against the H5N1 strain.  If bird flu is suspected in a person, treatment should start immediately without waiting for laboratory confirmation.  Treatment for the H5N1 strain is essentially the same as treating other influenza viruses. PREVENTION   Stay home from work, school, and errands when you are sick. Not being in contact with other people will help stop the spread of illness.  Cover your mouth and nose with your arm when coughing or sneezing. This may help keep those around you from getting sick.  Wash your hands often with warm water and soap. Illnesses are often spread when a person touches something that is contaminated with germs and then touches his or her eyes, nose, or mouth.  Antiviral medications can help prevent the flu.  For optimal health, get plenty of rest, eat a healthy diet, and exercise. Document Released: 02/22/2003 Document Revised: 07/06/2013 Document Reviewed: 09/18/2007 Exit[employer] Patient Information 2015 [employer]. This information is not intended to replace advice given to you by your health care provider. Make sure you discuss any questions you have with your health care provider.  

## 2014-02-11 ENCOUNTER — Encounter (HOSPITAL_COMMUNITY): Payer: Self-pay | Admitting: Cardiology

## 2014-03-26 ENCOUNTER — Other Ambulatory Visit: Payer: Self-pay | Admitting: Interventional Cardiology

## 2014-04-08 ENCOUNTER — Other Ambulatory Visit: Payer: Self-pay | Admitting: Interventional Cardiology

## 2014-06-06 ENCOUNTER — Encounter: Payer: Self-pay | Admitting: Gastroenterology

## 2014-06-09 ENCOUNTER — Other Ambulatory Visit: Payer: Self-pay

## 2014-06-09 ENCOUNTER — Telehealth: Payer: Self-pay

## 2014-06-09 MED ORDER — ATORVASTATIN CALCIUM 40 MG PO TABS: ORAL_TABLET | ORAL | Status: DC

## 2014-06-09 NOTE — Telephone Encounter (Signed)
LMTCO.

## 2014-08-03 ENCOUNTER — Other Ambulatory Visit (HOSPITAL_BASED_OUTPATIENT_CLINIC_OR_DEPARTMENT_OTHER): Payer: Medicare Other

## 2014-08-03 ENCOUNTER — Ambulatory Visit (HOSPITAL_COMMUNITY)
Admission: RE | Admit: 2014-08-03 | Discharge: 2014-08-03 | Disposition: A | Discharge status: Home / Self Care | Payer: Medicare Other | Source: Ambulatory Visit | Attending: Internal Medicine | Admitting: Internal Medicine

## 2014-08-03 ENCOUNTER — Other Ambulatory Visit: Payer: Self-pay | Admitting: Medical Oncology

## 2014-08-03 ENCOUNTER — Encounter (HOSPITAL_COMMUNITY): Payer: Self-pay

## 2014-08-03 DIAGNOSIS — Z8572 Personal history of non-Hodgkin lymphomas: Secondary | ICD-10-CM

## 2014-08-03 DIAGNOSIS — C8510 Unspecified B-cell lymphoma, unspecified site: Secondary | ICD-10-CM | POA: Diagnosis not present

## 2014-08-03 DIAGNOSIS — C8590 Non-Hodgkin lymphoma, unspecified, unspecified site: Secondary | ICD-10-CM

## 2014-08-03 LAB — COMPREHENSIVE METABOLIC PANEL (CC13)
ALBUMIN: 3.9 g/dL (ref 3.5–5.0)
ALT: 14 U/L (ref 0–55)
AST: 23 U/L (ref 5–34)
Alkaline Phosphatase: 109 U/L (ref 40–150)
Anion Gap: 9 mEq/L (ref 3–11)
BUN: 9 mg/dL (ref 7.0–26.0)
CHLORIDE: 107 meq/L (ref 98–109)
CO2: 23 mEq/L (ref 22–29)
Calcium: 8.7 mg/dL (ref 8.4–10.4)
Creatinine: 0.9 mg/dL (ref 0.7–1.3)
EGFR: 85 mL/min/{1.73_m2} — AB (ref >90)
GLUCOSE: 87 mg/dL (ref 70–140)
POTASSIUM: 4.1 meq/L (ref 3.5–5.1)
Sodium: 139 mEq/L (ref 136–145)
Total Bilirubin: 0.44 mg/dL (ref 0.20–1.20)
Total Protein: 6.4 g/dL (ref 6.4–8.3)

## 2014-08-03 LAB — CBC WITH DIFFERENTIAL/PLATELET
BASO%: 0.4 % (ref 0.0–2.0)
Basophils Absolute: 0 10*3/uL (ref 0.0–0.1)
EOS ABS: 0.1 10*3/uL (ref 0.0–0.5)
EOS%: 1.5 % (ref 0.0–7.0)
HCT: 40.5 % (ref 38.4–49.9)
HEMOGLOBIN: 14.2 g/dL (ref 13.0–17.1)
LYMPH%: 26.7 % (ref 14.0–49.0)
MCH: 30.9 pg (ref 27.2–33.4)
MCHC: 35.1 g/dL (ref 32.0–36.0)
MCV: 88 fL (ref 79.3–98.0)
MONO#: 0.6 10*3/uL (ref 0.1–0.9)
MONO%: 12.9 % (ref 0.0–14.0)
NEUT%: 58.5 % (ref 39.0–75.0)
NEUTROS ABS: 2.7 10*3/uL (ref 1.5–6.5)
NRBC: 0 % (ref 0–0)
PLATELETS: 228 10*3/uL (ref 140–400)
RBC: 4.6 10*6/uL (ref 4.20–5.82)
RDW: 13.4 % (ref 11.0–14.6)
WBC: 4.6 10*3/uL (ref 4.0–10.3)
lymph#: 1.2 10*3/uL (ref 0.9–3.3)

## 2014-08-03 LAB — LACTATE DEHYDROGENASE (CC13): LDH: 234 U/L (ref 125–245)

## 2014-08-03 MED ORDER — IOHEXOL 300 MG/ML  SOLN
100.0000 mL | Freq: Once | INTRAMUSCULAR | Status: AC | PRN
  Administered 2014-08-03: 100 mL via INTRAVENOUS

## 2014-08-10 ENCOUNTER — Encounter: Payer: Self-pay | Admitting: Internal Medicine

## 2014-08-10 ENCOUNTER — Ambulatory Visit (HOSPITAL_BASED_OUTPATIENT_CLINIC_OR_DEPARTMENT_OTHER): Payer: Medicare Other | Admitting: Internal Medicine

## 2014-08-10 ENCOUNTER — Telehealth: Payer: Self-pay | Admitting: Internal Medicine

## 2014-08-10 VITALS — BP 118/70 | HR 77 | Temp 98.3°F | Resp 18 | Ht 67.0 in | Wt 153.5 lb

## 2014-08-10 DIAGNOSIS — Z8572 Personal history of non-Hodgkin lymphomas: Secondary | ICD-10-CM

## 2014-08-10 DIAGNOSIS — R599 Enlarged lymph nodes, unspecified: Secondary | ICD-10-CM | POA: Diagnosis not present

## 2014-08-10 DIAGNOSIS — C8590 Non-Hodgkin lymphoma, unspecified, unspecified site: Secondary | ICD-10-CM

## 2014-08-10 NOTE — Telephone Encounter (Signed)
per pof to sch pt appt-gave pt contrast for CT-adv [facility] will call to sch scan-gave pt copy of avs

## 2014-08-10 NOTE — Progress Notes (Signed)
[facility] [phone]   Fax:([phone]) [place], [place] [address]  PRINCIPAL DIAGNOSIS: Bulky stage IV low grade lymphoma diagnosed in November 2009.   PRIOR THERAPY:  1. Status post 7 cycles of systemic chemotherapy with CHOP/Rituxan. Last dose was given May 2010. 2. Status post 6 cycles of maintenance Rituxan 375 mg/sq m given every 2 months. Last dose was given on May 29, 2010, and the patient was lost to followup at that time. He received another dose on 12/11/2010, then again missed 2 doses. 3. systemic chemotherapy with Rituxan 375 mg/M2 on day 1 and that bendamustine 90 mg/M2 on days 1 and 2 every 4 weeks, status post 6 cycles. 4. Maintenance Rituxan 375 mg/M2 every 2 months, status post 12 cycles   CURRENT THERAPY: Observation.  INTERVAL HISTORY: Eric [name] 74 y.o. male returns to the clinic today for followup visit. The patient is feeling fine today with no specific complaints. He denied having any significant chest pain, shortness of breath, cough or hemoptysis. He denied having any night sweats. He intentionally lost few pounds recently. He has no fever or chills, no nausea or vomiting. He has no lymphadenopathy. He completed treatment with maintenance rituximab fairly well with no significant adverse effects. She had repeat CT scan of the chest, abdomen and pelvis performed recently and he is here for evaluation and discussion of his scan results.  MEDICAL HISTORY: Past Medical History  Diagnosis Date  . History of heart attack   . Cancer   . Non Hodgkin's lymphoma     ALLERGIES:  is allergic to neosporin.  MEDICATIONS:  Current Outpatient Prescriptions  Medication Sig Dispense Refill  . aspirin EC 325 MG EC tablet Take 1 tablet (325 mg total) by mouth daily. 30 tablet   . atorvastatin (LIPITOR) 40 MG tablet TAKE 1 TABLET (40 MG TOTAL) BY MOUTH DAILY AT 6 PM. 30  tablet 2  . diphenhydrAMINE (BENADRYL) 25 MG tablet Take 25 mg by mouth at bedtime as needed. Sleep    . EFFIENT 10 MG TABS tablet TAKE 1 TABLET (10 MG TOTAL) BY MOUTH DAILY. 30 tablet 0  . ferrous sulfate 325 (65 FE) MG tablet Take 325 mg by mouth daily with breakfast.    . nitroGLYCERIN (NITROSTAT) 0.4 MG SL tablet Place 1 tablet (0.4 mg total) under the tongue every 5 (five) minutes x 3 doses as needed for chest pain. 25 tablet 3  . sildenafil (VIAGRA) 25 MG tablet Take 2-3 tablets once a day as directed. 30 tablet 1   No current facility-administered medications for this visit.    SURGICAL HISTORY:  Past Surgical History  Procedure Laterality Date  . Coronary stent placement    . Left heart catheterization with coronary angiogram N/A 03/03/2012    Procedure: LEFT HEART CATHETERIZATION WITH CORONARY ANGIOGRAM;  Surgeon: [name], MD;  Location: [facility] CATH LAB;  Service: Cardiovascular;  Laterality: N/A;  . Percutaneous coronary stent intervention (pci-s) N/A 03/04/2012    Procedure: PERCUTANEOUS CORONARY STENT INTERVENTION (PCI-S);  Surgeon: [name], MD;  Location: [facility] CATH LAB;  Service: Cardiovascular;  Laterality: N/A;    REVIEW OF SYSTEMS:  Constitutional: negative Eyes: negative Ears, nose, mouth, throat, and face: negative Respiratory: negative Cardiovascular: negative Gastrointestinal: negative Genitourinary:negative Integument/breast: negative Hematologic/lymphatic: negative Musculoskeletal:negative Neurological: negative Behavioral/Psych: negative Endocrine: negative Allergic/Immunologic: negative   PHYSICAL EXAMINATION: General appearance: alert, cooperative and no distress  Head: Normocephalic, without obvious abnormality, atraumatic Neck: no adenopathy Lymph nodes: Cervical, supraclavicular, and axillary nodes normal. Resp: clear to auscultation bilaterally Back: symmetric, no curvature. ROM normal. No CVA tenderness. Cardio: regular rate and rhythm,  S1, S2 normal, no murmur, click, rub or gallop GI: soft, non-tender; bowel sounds normal; no masses,  no organomegaly Extremities: extremities normal, atraumatic, no cyanosis or edema Neurologic: Alert and oriented X 3, normal strength and tone. Normal symmetric reflexes. Normal coordination and gait  ECOG PERFORMANCE STATUS: 0 - Asymptomatic  Blood pressure 118/70, pulse 77, temperature 98.3 F (36.8 C), temperature source Oral, resp. rate 18, height '5\' 7"'$  (1.702 m), weight 153 lb 8 oz (69.627 kg), SpO2 100 %.  LABORATORY DATA: Lab Results  Component Value Date   WBC 4.6 08/03/2014   HGB 14.2 08/03/2014   HCT 40.5 08/03/2014   MCV 88.0 08/03/2014   PLT 228 08/03/2014      Chemistry      Component Value Date/Time   NA 139 08/03/2014 1522   NA 142 03/05/2012 0545   NA 141 03/23/2010 1301   K 4.1 08/03/2014 1522   K 3.3* 03/05/2012 0545   K 4.7 03/23/2010 1301   CL 106 08/06/2012 1244   CL 107 03/05/2012 0545   CL 99 03/23/2010 1301   CO2 23 08/03/2014 1522   CO2 23 03/05/2012 0545   CO2 26 03/23/2010 1301   BUN 9.0 08/03/2014 1522   BUN 9 03/05/2012 0545   BUN 12 03/23/2010 1301   CREATININE 0.9 08/03/2014 1522   CREATININE 0.92 03/05/2012 0545   CREATININE 1.0 03/23/2010 1301      Component Value Date/Time   CALCIUM 8.7 08/03/2014 1522   CALCIUM 8.5 03/05/2012 0545   CALCIUM 9.1 03/23/2010 1301   ALKPHOS 109 08/03/2014 1522   ALKPHOS 86 03/03/2012 0030   ALKPHOS 78 03/23/2010 1301   AST 23 08/03/2014 1522   AST 97* 03/03/2012 0030   AST 25 03/23/2010 1301   ALT 14 08/03/2014 1522   ALT 25 03/03/2012 0030   ALT 20 03/23/2010 1301   BILITOT 0.44 08/03/2014 1522   BILITOT 0.2* 03/03/2012 0030   BILITOT 0.60 03/23/2010 1301       RADIOGRAPHIC STUDIES: Ct Chest W Contrast  08/04/2014   CLINICAL DATA:  Non-Hodgkin's lymphoma, following up adenopathy. Restaging.  EXAM: CT CHEST, ABDOMEN, AND PELVIS WITH CONTRAST  TECHNIQUE: Multidetector CT imaging of the  chest, abdomen and pelvis was performed following the standard protocol during bolus administration of intravenous contrast.  CONTRAST:  133m OMNIPAQUE IOHEXOL 300 MG/ML  SOLN  COMPARISON:  02/03/2014  FINDINGS: CT CHEST FINDINGS  Mediastinum/Nodes: Air-fluid level in the moderately dilated esophagus. Small type 1 hiatal hernia.  Enlargement of left axillary lymph nodes compared to prior ; an index node measures 1.2 cm on image 11 of series 2, formerly 0.8 cm.  Considerable coronary artery atherosclerosis.  Right axillary clips.  Lungs/Pleura: The lungs remain clear.  Musculoskeletal: Right seventh rib expansile ground-glass density lesion, image 48 series 4, stable back through 2009.  CT ABDOMEN PELVIS FINDINGS  Hepatobiliary: New unremarkable  Pancreas: Unremarkable  Spleen: Unremarkable  Adrenals/Urinary Tract: 4.6 cm Bosniak category 1 right mid kidney cyst. Several left kidney parapelvic cysts.  Stomach/Bowel: Sigmoid colon diverticulosis.  Vascular/Lymphatic: Aortoiliac atherosclerotic vascular disease.  There is considerably worsened adenopathy in the peripancreatic region, mesentery, retroperitoneum, and pelvis. Index peripancreatic lymph node 1.2 cm on image 62 series 2 (formerly 0.5 cm). Index mesenteric lymph node at  the level of the iliac crests 1.2 cm on image 84 series 2 (formerly 0.3 cm). Left periaortic lymph node 1.2 cm on image 59 series 2 (formerly 0.6 cm). Multiple additional scattered lymph nodes are present especially in the mesentery.  Reproductive: Massive enlargement of the prostate gland, 8.2 by 6.6 by 10.5 cm (volume estimated at 295 cc).  Other: No supplemental non-categorized findings.  Musculoskeletal: 2.4 cm paraumbilical hernia contains omental adipose tissue. This extends just above the umbilicus.  IMPRESSION: 1. Recurrent adenopathy in the left axilla, mesentery, retroperitoneum, peripancreatic region, and pelvis. Involve lymph nodes measure about 1.2 cm, only mildly enlarged but  definitively worse than prior, suspicious for recurrence. 2. Other ancillary findings include an air- fluid level in a moderately dilated esophagus suggesting dysmotility or reflux; small type 1 hiatal hernia ; coronary artery atherosclerosis ; a chronic ground-glass density lesion of the right seventh rib which is probably an enchondroma; bilateral renal simple cysts; sigmoid colon diverticulosis; massively enlarged prostate gland; and a 2.4 cm paraumbilical hernia containing adipose tissue.   Electronically Signed   By: Eric [name] M.D.   On: 08/04/2014 09:25   Ct Abdomen Pelvis W Contrast  08/04/2014   CLINICAL DATA:  Non-Hodgkin's lymphoma, following up adenopathy. Restaging.  EXAM: CT CHEST, ABDOMEN, AND PELVIS WITH CONTRAST  TECHNIQUE: Multidetector CT imaging of the chest, abdomen and pelvis was performed following the standard protocol during bolus administration of intravenous contrast.  CONTRAST:  175m OMNIPAQUE IOHEXOL 300 MG/ML  SOLN  COMPARISON:  02/03/2014  FINDINGS: CT CHEST FINDINGS  Mediastinum/Nodes: Air-fluid level in the moderately dilated esophagus. Small type 1 hiatal hernia.  Enlargement of left axillary lymph nodes compared to prior ; an index node measures 1.2 cm on image 11 of series 2, formerly 0.8 cm.  Considerable coronary artery atherosclerosis.  Right axillary clips.  Lungs/Pleura: The lungs remain clear.  Musculoskeletal: Right seventh rib expansile ground-glass density lesion, image 48 series 4, stable back through 2009.  CT ABDOMEN PELVIS FINDINGS  Hepatobiliary: New unremarkable  Pancreas: Unremarkable  Spleen: Unremarkable  Adrenals/Urinary Tract: 4.6 cm Bosniak category 1 right mid kidney cyst. Several left kidney parapelvic cysts.  Stomach/Bowel: Sigmoid colon diverticulosis.  Vascular/Lymphatic: Aortoiliac atherosclerotic vascular disease.  There is considerably worsened adenopathy in the peripancreatic region, mesentery, retroperitoneum, and pelvis. Index  peripancreatic lymph node 1.2 cm on image 62 series 2 (formerly 0.5 cm). Index mesenteric lymph node at the level of the iliac crests 1.2 cm on image 84 series 2 (formerly 0.3 cm). Left periaortic lymph node 1.2 cm on image 59 series 2 (formerly 0.6 cm). Multiple additional scattered lymph nodes are present especially in the mesentery.  Reproductive: Massive enlargement of the prostate gland, 8.2 by 6.6 by 10.5 cm (volume estimated at 295 cc).  Other: No supplemental non-categorized findings.  Musculoskeletal: 2.4 cm paraumbilical hernia contains omental adipose tissue. This extends just above the umbilicus.  IMPRESSION: 1. Recurrent adenopathy in the left axilla, mesentery, retroperitoneum, peripancreatic region, and pelvis. Involve lymph nodes measure about 1.2 cm, only mildly enlarged but definitively worse than prior, suspicious for recurrence. 2. Other ancillary findings include an air- fluid level in a moderately dilated esophagus suggesting dysmotility or reflux; small type 1 hiatal hernia ; coronary artery atherosclerosis ; a chronic ground-glass density lesion of the right seventh rib which is probably an enchondroma; bilateral renal simple cysts; sigmoid colon diverticulosis; massively enlarged prostate gland; and a 2.4 cm paraumbilical hernia containing adipose tissue.   Electronically Signed   By:  Eric [name] M.D.   On: 08/04/2014 09:25   ASSESSMENT AND PLAN: This is a very pleasant 74 years old male with history of bulky stage IV low-grade lymphoma completed maintenance Rituxan status post 12 cycles. The recent CT scan of the chest, abdomen and pelvis showed no significant evidence for lymphoma recurrence except for mildly enlarged left axillary lymph node. I discussed the scan results with the patient today. I recommended for him to continue on observation with repeat CT scan of the chest, abdomen and pelvis in 3 months for restaging of his disease and close monitoring of the enlarging left  axillary lymphadenopathy. He was advised to call immediately if he has any concerning symptoms in the interval. The patient voices understanding of current disease status and treatment options and is in agreement with the current care plan.  All questions were answered. The patient knows to call the clinic with any problems, questions or concerns. We can certainly see the patient much sooner if necessary.  Disclaimer: This note was dictated with voice recognition software. Similar sounding words can inadvertently be transcribed and may not be corrected upon review.

## 2014-08-11 ENCOUNTER — Other Ambulatory Visit: Payer: Self-pay | Admitting: Interventional Cardiology

## 2014-08-12 NOTE — Telephone Encounter (Signed)
Patient has never been seen in this office, but has an appointment next week. Ok to refill until then? Please advise. Thanks, MI

## 2014-08-18 DIAGNOSIS — I2510 Atherosclerotic heart disease of native coronary artery without angina pectoris: Secondary | ICD-10-CM | POA: Insufficient documentation

## 2014-08-18 DIAGNOSIS — I25118 Atherosclerotic heart disease of native coronary artery with other forms of angina pectoris: Secondary | ICD-10-CM | POA: Insufficient documentation

## 2014-08-20 ENCOUNTER — Ambulatory Visit (INDEPENDENT_AMBULATORY_CARE_PROVIDER_SITE_OTHER): Payer: Medicare Other | Admitting: Interventional Cardiology

## 2014-08-20 ENCOUNTER — Encounter: Payer: Self-pay | Admitting: Interventional Cardiology

## 2014-08-20 VITALS — BP 118/70 | HR 57 | Ht 67.0 in | Wt 150.8 lb

## 2014-08-20 DIAGNOSIS — N529 Male erectile dysfunction, unspecified: Secondary | ICD-10-CM

## 2014-08-20 DIAGNOSIS — I2510 Atherosclerotic heart disease of native coronary artery without angina pectoris: Secondary | ICD-10-CM

## 2014-08-20 DIAGNOSIS — I214 Non-ST elevation (NSTEMI) myocardial infarction: Secondary | ICD-10-CM | POA: Diagnosis not present

## 2014-08-20 DIAGNOSIS — T45525D Adverse effect of antithrombotic drugs, subsequent encounter: Secondary | ICD-10-CM

## 2014-08-20 LAB — HEPATIC FUNCTION PANEL
ALBUMIN: 3.8 g/dL (ref 3.5–5.2)
ALT: 12 U/L (ref 0–53)
AST: 20 U/L (ref 0–37)
Alkaline Phosphatase: 86 U/L (ref 39–117)
Bilirubin, Direct: 0.1 mg/dL (ref 0.0–0.3)
TOTAL PROTEIN: 5.8 g/dL — AB (ref 6.0–8.3)
Total Bilirubin: 0.6 mg/dL (ref 0.2–1.2)

## 2014-08-20 LAB — LIPID PANEL
Cholesterol: 147 mg/dL (ref 0–200)
HDL: 43 mg/dL (ref >39.00)
LDL Cholesterol: 95 mg/dL (ref 0–99)
NONHDL: 104
TRIGLYCERIDES: 47 mg/dL (ref 0.0–149.0)
Total CHOL/HDL Ratio: 3
VLDL: 9.4 mg/dL (ref 0.0–40.0)

## 2014-08-20 NOTE — Progress Notes (Signed)
Cardiology Office Note   Date:  08/20/2014   ID:  Eric [name], DOB [birth year], MRN [id number]  PCP:  Kennon Portela, MD  Cardiologist:  [name], MD   Chief Complaint  Patient presents with  . Coronary Artery Disease      History of Present Illness: Eric [name] is a 74 y.o. male who presents for CAD with RCA DES 2013, and hyperlipidemia.  Eric [name] is doing well. He has no cardiopulmonary complaints. He has not had angina. He exercises routinely. He has not had dyspnea on exertion or claudication. He denies syncope and lower extremity swelling. He has no voiced medication intolerance.    Past Medical History  Diagnosis Date  . History of heart attack   . Cancer   . Non Hodgkin's lymphoma   . Coronary atherosclerosis of native coronary artery   . Hyperlipidemia     Past Surgical History  Procedure Laterality Date  . Coronary stent placement    . Left heart catheterization with coronary angiogram N/A 03/03/2012    Procedure: LEFT HEART CATHETERIZATION WITH CORONARY ANGIOGRAM;  Surgeon: [name], MD;  Location: [facility] CATH LAB;  Service: Cardiovascular;  Laterality: N/A;  . Percutaneous coronary stent intervention (pci-s) N/A 03/04/2012    Procedure: PERCUTANEOUS CORONARY STENT INTERVENTION (PCI-S);  Surgeon: [name], MD;  Location: [facility] CATH LAB;  Service: Cardiovascular;  Laterality: N/A;     Current Outpatient Prescriptions  Medication Sig Dispense Refill  . aspirin EC 325 MG EC tablet Take 1 tablet (325 mg total) by mouth daily. 30 tablet   . atorvastatin (LIPITOR) 40 MG tablet TAKE 1 TABLET (40 MG TOTAL) BY MOUTH DAILY AT 6 PM. 30 tablet 2  . diphenhydrAMINE (BENADRYL) 25 MG tablet Take 25 mg by mouth at bedtime as needed. Sleep    . EFFIENT 10 MG TABS tablet TAKE 1 TABLET (10 MG TOTAL) BY MOUTH DAILY. 10 tablet 0  . nitroGLYCERIN (NITROSTAT) 0.4 MG SL tablet Place 1 tablet (0.4 mg total) under the tongue every 5 (five) minutes x 3  doses as needed for chest pain. 25 tablet 3  . sildenafil (VIAGRA) 25 MG tablet Take 25 mg by mouth daily as needed. Take 2-3 tablets by mouth once daily as needed.     No current facility-administered medications for this visit.    Allergies:   Neosporin    Social History:  The patient  reports that he has never smoked. He has never used smokeless tobacco. He reports that he drinks alcohol. He reports that he does not use illicit drugs.   Family History:  The patient's family history includes Heart disease in his father; Leukemia in his mother.    ROS:  Please see the history of present illness.   Otherwise, review of systems are positive for decreased hearing.   All other systems are reviewed and negative.    PHYSICAL EXAM: VS:  BP 118/70 mmHg  Pulse 57  Ht '5\' 7"'$  (1.702 m)  Wt 68.402 kg (150 lb 12.8 oz)  BMI 23.61 kg/m2 , BMI Body mass index is 23.61 kg/(m^2). GEN: Well nourished, well developed, in no acute distress HEENT: normal Neck: no JVD, carotid bruits, or masses Cardiac: RRR; no murmurs, rubs, or gallops,no edema  Respiratory:  clear to auscultation bilaterally, normal work of breathing GI: soft, nontender, nondistended, + BS MS: no deformity or atrophy Skin: warm and dry, no rash Neuro:  Strength and sensation are intact Psych: euthymic mood, full  affect   EKG:  EKG is ordered today. The ekg ordered today demonstrates sinus bradycardia and otherwise normal   Recent Labs: 08/03/2014: ALT 14; BUN 9.0; Creatinine 0.9; HGB 14.2; Platelets 228; Potassium 4.1; Sodium 139    Lipid Panel No results found for: CHOL, TRIG, HDL, CHOLHDL, VLDL, LDLCALC, LDLDIRECT    Wt Readings from Last 3 Encounters:  08/20/14 68.402 kg (150 lb 12.8 oz)  08/10/14 69.627 kg (153 lb 8 oz)  02/09/14 69.718 kg (153 lb 11.2 oz)      Other studies Reviewed: Additional studies/ records that were reviewed today include: . Review of the above records demonstrates:    ASSESSMENT AND  PLAN:  Non-STEMI (non-ST elevated myocardial infarction) - AB remote  Antithrombotic drugs (platelet-aggregation inhibitors) causing adverse effect in therapeutic use, subsequent encounter - dual antiplatelets therapy on both high-dose aspirin and Atrovent  CAD in native artery - RCA DES 2013  Hyperlipidemia with no recent data     Current medicines are reviewed at length with the patient today.  The patient does not have concerns regarding medicines.  The following changes have been made:  Decrease aspirin 81 mg per day. Discontinue Effient. Check hepatic panel and lipid panel today.  Labs/ tests ordered today include:  No orders of the defined types were placed in this encounter.     Disposition:   FU with HS in 1 year  Signed, [name], MD  08/20/2014 11:45 AM    [address], [place], [place]  [address] Phone: [phone]; Fax: [phone]

## 2014-08-20 NOTE — Patient Instructions (Addendum)
Medication Instructions:  Your physician has recommended you make the following change in your medication:  STOP Effient   Labwork: Lipid, lft  Testing/Procedures: None   Follow-Up: Your physician wants you to follow-up in: 1 year with Dr.Smith You will receive a reminder letter in the mail two months in advance. If you don't receive a letter, please call our office to schedule the follow-up appointment.   Any Other Special Instructions Will Be Listed Below (If Applicable).

## 2014-08-24 ENCOUNTER — Telehealth: Payer: Self-pay

## 2014-08-24 DIAGNOSIS — E785 Hyperlipidemia, unspecified: Secondary | ICD-10-CM

## 2014-08-24 NOTE — Telephone Encounter (Signed)
-----   Message from Belva Crome, MD sent at 08/21/2014  1:40 PM EDT ----- Excellent results. Repeat in 1 year

## 2014-08-24 NOTE — Telephone Encounter (Signed)
Pt aware of lab results.  Excellent results. Repeat in 1 year  Pt verbalized understanding.

## 2014-11-03 ENCOUNTER — Encounter (HOSPITAL_COMMUNITY): Payer: Self-pay

## 2014-11-03 ENCOUNTER — Ambulatory Visit (HOSPITAL_COMMUNITY)
Admission: RE | Admit: 2014-11-03 | Discharge: 2014-11-03 | Disposition: A | Discharge status: Home / Self Care | Payer: Medicare Other | Source: Ambulatory Visit | Attending: Internal Medicine | Admitting: Internal Medicine

## 2014-11-03 ENCOUNTER — Telehealth: Payer: Self-pay | Admitting: Internal Medicine

## 2014-11-03 ENCOUNTER — Other Ambulatory Visit (HOSPITAL_BASED_OUTPATIENT_CLINIC_OR_DEPARTMENT_OTHER): Payer: Medicare Other

## 2014-11-03 DIAGNOSIS — R590 Localized enlarged lymph nodes: Secondary | ICD-10-CM | POA: Diagnosis not present

## 2014-11-03 DIAGNOSIS — N400 Enlarged prostate without lower urinary tract symptoms: Secondary | ICD-10-CM | POA: Insufficient documentation

## 2014-11-03 DIAGNOSIS — K5730 Diverticulosis of large intestine without perforation or abscess without bleeding: Secondary | ICD-10-CM | POA: Diagnosis not present

## 2014-11-03 DIAGNOSIS — C8590 Non-Hodgkin lymphoma, unspecified, unspecified site: Secondary | ICD-10-CM

## 2014-11-03 DIAGNOSIS — Z08 Encounter for follow-up examination after completed treatment for malignant neoplasm: Secondary | ICD-10-CM | POA: Diagnosis not present

## 2014-11-03 DIAGNOSIS — Z8572 Personal history of non-Hodgkin lymphomas: Secondary | ICD-10-CM

## 2014-11-03 DIAGNOSIS — I2510 Atherosclerotic heart disease of native coronary artery without angina pectoris: Secondary | ICD-10-CM | POA: Insufficient documentation

## 2014-11-03 DIAGNOSIS — I7090 Unspecified atherosclerosis: Secondary | ICD-10-CM | POA: Diagnosis not present

## 2014-11-03 LAB — CBC WITH DIFFERENTIAL/PLATELET
BASO%: 1.3 % (ref 0.0–2.0)
Basophils Absolute: 0.1 10*3/uL (ref 0.0–0.1)
EOS ABS: 0.2 10*3/uL (ref 0.0–0.5)
EOS%: 4.4 % (ref 0.0–7.0)
HCT: 42.7 % (ref 38.4–49.9)
HEMOGLOBIN: 14.4 g/dL (ref 13.0–17.1)
LYMPH%: 25.5 % (ref 14.0–49.0)
MCH: 30.5 pg (ref 27.2–33.4)
MCHC: 33.8 g/dL (ref 32.0–36.0)
MCV: 90.3 fL (ref 79.3–98.0)
MONO#: 0.7 10*3/uL (ref 0.1–0.9)
MONO%: 16.5 % — AB (ref 0.0–14.0)
NEUT#: 2.2 10*3/uL (ref 1.5–6.5)
NEUT%: 52.3 % (ref 39.0–75.0)
Platelets: 205 10*3/uL (ref 140–400)
RBC: 4.72 10*6/uL (ref 4.20–5.82)
RDW: 14 % (ref 11.0–14.6)
WBC: 4.1 10*3/uL (ref 4.0–10.3)
lymph#: 1.1 10*3/uL (ref 0.9–3.3)

## 2014-11-03 LAB — COMPREHENSIVE METABOLIC PANEL (CC13)
ALT: 20 U/L (ref 0–55)
AST: 24 U/L (ref 5–34)
Albumin: 3.7 g/dL (ref 3.5–5.0)
Alkaline Phosphatase: 112 U/L (ref 40–150)
Anion Gap: 9 mEq/L (ref 3–11)
BILIRUBIN TOTAL: 0.47 mg/dL (ref 0.20–1.20)
BUN: 13.1 mg/dL (ref 7.0–26.0)
CHLORIDE: 106 meq/L (ref 98–109)
CO2: 28 meq/L (ref 22–29)
CREATININE: 1.1 mg/dL (ref 0.7–1.3)
Calcium: 9.5 mg/dL (ref 8.4–10.4)
EGFR: 69 mL/min/{1.73_m2} — AB (ref >90)
Glucose: 89 mg/dl (ref 70–140)
Potassium: 4.1 mEq/L (ref 3.5–5.1)
Sodium: 143 mEq/L (ref 136–145)
TOTAL PROTEIN: 6.2 g/dL — AB (ref 6.4–8.3)

## 2014-11-03 LAB — LACTATE DEHYDROGENASE (CC13): LDH: 207 U/L (ref 125–245)

## 2014-11-03 MED ORDER — IOHEXOL 300 MG/ML  SOLN
100.0000 mL | Freq: Once | INTRAMUSCULAR | Status: AC | PRN
  Administered 2014-11-03: 100 mL via INTRAVENOUS

## 2014-11-03 NOTE — Telephone Encounter (Signed)
pt came by to r/s appt-r/s pt w/ Adrena  Mm had no appt s until 9/12-sent Dr Julien Nordmann email to adv of r/s appt time & date-gave pt avs

## 2014-11-10 ENCOUNTER — Ambulatory Visit: Payer: Medicare Other | Admitting: Internal Medicine

## 2014-11-15 ENCOUNTER — Telehealth: Payer: Self-pay | Admitting: Internal Medicine

## 2014-11-15 ENCOUNTER — Encounter: Payer: Self-pay | Admitting: Physician Assistant

## 2014-11-15 ENCOUNTER — Ambulatory Visit (HOSPITAL_BASED_OUTPATIENT_CLINIC_OR_DEPARTMENT_OTHER): Payer: Medicare Other | Admitting: Physician Assistant

## 2014-11-15 VITALS — BP 134/77 | HR 70 | Temp 97.8°F | Resp 18 | Ht 67.0 in | Wt 153.4 lb

## 2014-11-15 DIAGNOSIS — C8590 Non-Hodgkin lymphoma, unspecified, unspecified site: Secondary | ICD-10-CM

## 2014-11-15 NOTE — Telephone Encounter (Signed)
Gave adn printed appt sched and avs for pt for Sept adn OCT

## 2014-11-15 NOTE — Progress Notes (Addendum)
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   PRIOR THERAPY:  1. Status post 7 cycles of systemic chemotherapy with CHOP/Rituxan. Last dose was given May 2010. 2. Status post 6 cycles of maintenance Rituxan 375 mg/sq m given every 2 months. Last dose was given on May 29, 2010, and the patient was lost to followup at that time. He received another dose on 12/11/2010, then again missed 2 doses. 3. systemic chemotherapy with Rituxan 375 mg/M2 on day 1 and that bendamustine 90 mg/M2 on days 1 and 2 every 4 weeks, status post 6 cycles. 4. Maintenance Rituxan 375 mg/M2 every 2 months, status post 12 cycles   CURRENT THERAPY: Observation.  INTERVAL HISTORY: Eric [name] 74 y.o. [name] returns to the clinic today for followup visit. The patient is feeling fine today with no specific complaints. He denied having any significant chest pain, shortness of breath, cough or hemoptysis. He denied having any night sweats.  He has no fever or chills, no nausea or vomiting. He has no lymphadenopathy. He completed and tolerated treatment with maintenance rituximab fairly well with no significant adverse effects. He had repeat CT scan of the chest, abdomen and pelvis performed recently and he is here for evaluation and discussion of his scan results.  MEDICAL HISTORY: Past Medical History  Diagnosis Date  . History of heart attack   . Cancer   . Non Hodgkin's lymphoma   . Coronary atherosclerosis of native coronary artery   . Hyperlipidemia     ALLERGIES:  is allergic to neosporin.  MEDICATIONS:  Current Outpatient Prescriptions  Medication Sig Dispense Refill  . aspirin EC 325 MG EC tablet Take 1 tablet (325 mg total) by mouth daily. 30 tablet   . atorvastatin (LIPITOR) 40 MG tablet TAKE 1  TABLET (40 MG TOTAL) BY MOUTH DAILY AT 6 PM. 30 tablet 2  . diphenhydrAMINE (BENADRYL) 25 MG tablet Take 25 mg by mouth at bedtime as needed. Sleep    . nitroGLYCERIN (NITROSTAT) 0.4 MG SL tablet Place 1 tablet (0.4 mg total) under the tongue every 5 (five) minutes x 3 doses as needed for chest pain. 25 tablet 3  . sildenafil (VIAGRA) 25 MG tablet Take 25 mg by mouth daily as needed. Take 2-3 tablets by mouth once daily as needed.     No current facility-administered medications for this visit.    SURGICAL HISTORY:  Past Surgical History  Procedure Laterality Date  . Coronary stent placement    . Left heart catheterization with coronary angiogram N/A 03/03/2012    Procedure: LEFT HEART CATHETERIZATION WITH CORONARY ANGIOGRAM;  Surgeon: [name], MD;  Location: [facility] CATH LAB;  Service: Cardiovascular;  Laterality: N/A;  . Percutaneous coronary stent intervention (pci-s) N/A 03/04/2012    Procedure: PERCUTANEOUS CORONARY STENT INTERVENTION (PCI-S);  Surgeon: [name], MD;  Location: [facility] CATH LAB;  Service: Cardiovascular;  Laterality: N/A;    REVIEW OF SYSTEMS:  Constitutional: negative Eyes: negative Ears, nose, mouth, throat, and face: negative Respiratory: negative Cardiovascular: negative Gastrointestinal: negative Genitourinary:negative Integument/breast: negative Hematologic/lymphatic: negative Musculoskeletal:negative Neurological: negative Behavioral/Psych: negative Endocrine: negative Allergic/Immunologic: negative   PHYSICAL EXAMINATION: General appearance: alert, cooperative and no distress Head: Normocephalic, without obvious abnormality, atraumatic Neck: no adenopathy Lymph nodes: Cervical, supraclavicular, and axillary nodes normal. Resp: clear to  auscultation bilaterally Back: symmetric, no curvature. ROM normal. No CVA tenderness. Cardio: regular rate and rhythm, S1, S2 normal, no murmur, click, rub or gallop GI: soft, non-tender; bowel sounds normal; no  masses,  no organomegaly Extremities: extremities normal, atraumatic, no cyanosis or edema Neurologic: Alert and oriented X 3, normal strength and tone. Normal symmetric reflexes. Normal coordination and gait  ECOG PERFORMANCE STATUS: 0 - Asymptomatic  Blood pressure 134/77, pulse 70, temperature 97.8 F (36.6 C), temperature source Oral, resp. rate 18, height '5\' 7"'$  (1.702 m), weight 153 lb 6.4 oz (69.582 kg), SpO2 100 %.  LABORATORY DATA: Lab Results  Component Value Date   WBC 4.1 11/03/2014   HGB 14.4 11/03/2014   HCT 42.7 11/03/2014   MCV 90.3 11/03/2014   PLT 205 11/03/2014      Chemistry      Component Value Date/Time   NA 143 11/03/2014 0918   NA 142 03/05/2012 0545   NA 141 03/23/2010 1301   K 4.1 11/03/2014 0918   K 3.3* 03/05/2012 0545   K 4.7 03/23/2010 1301   CL 106 08/06/2012 1244   CL 107 03/05/2012 0545   CL 99 03/23/2010 1301   CO2 28 11/03/2014 0918   CO2 23 03/05/2012 0545   CO2 26 03/23/2010 1301   BUN 13.1 11/03/2014 0918   BUN 9 03/05/2012 0545   BUN 12 03/23/2010 1301   CREATININE 1.1 11/03/2014 0918   CREATININE 0.92 03/05/2012 0545   CREATININE 1.0 03/23/2010 1301      Component Value Date/Time   CALCIUM 9.5 11/03/2014 0918   CALCIUM 8.5 03/05/2012 0545   CALCIUM 9.1 03/23/2010 1301   ALKPHOS 112 11/03/2014 0918   ALKPHOS 86 08/20/2014 1220   ALKPHOS 78 03/23/2010 1301   AST 24 11/03/2014 0918   AST 20 08/20/2014 1220   AST 25 03/23/2010 1301   ALT 20 11/03/2014 0918   ALT 12 08/20/2014 1220   ALT 20 03/23/2010 1301   BILITOT 0.47 11/03/2014 0918   BILITOT 0.6 08/20/2014 1220   BILITOT 0.60 03/23/2010 1301       RADIOGRAPHIC STUDIES: Ct Chest W Contrast  11/03/2014   CLINICAL DATA:  74 year old [name] with history of lymphoma originally diagnosed in 2009 status post chemotherapy. Restaging examination.  EXAM: CT CHEST, ABDOMEN, AND PELVIS WITH CONTRAST  TECHNIQUE: Multidetector CT imaging of the chest, abdomen and pelvis was  performed following the standard protocol during bolus administration of intravenous contrast.  CONTRAST:  118m OMNIPAQUE IOHEXOL 300 MG/ML  SOLN  COMPARISON:  Multiple priors, most recently CT of the chest, abdomen and pelvis 08/03/2014.  FINDINGS: CT CHEST FINDINGS  Mediastinum/Lymph Nodes: Heart size is normal. There is no significant pericardial fluid, thickening or pericardial calcification. There is atherosclerosis of the thoracic aorta, the great vessels of the mediastinum and the coronary arteries, including calcified atherosclerotic plaque in the left main, left anterior descending, left circumflex and right coronary arteries. No pathologically enlarged mediastinal or hilar lymph nodes. Small hiatal hernia. Status post right axillary nodal dissection. Numerous borderline enlarged and mildly enlarged left axillary lymph nodes measuring up to 12 mm in short axis, similar to the recent prior examination.  Lungs/Pleura: No suspicious appearing pulmonary nodules or masses. No acute consolidative airspace disease. No pleural effusions.  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 There are  no aggressive appearing lytic or blastic lesions noted in the visualized portions of the skeleton.  CT ABDOMEN AND PELVIS FINDINGS  Hepatobiliary: No cystic or solid hepatic lesions. No intra or extrahepatic biliary ductal dilatation. Gallbladder is normal in appearance.  Pancreas: No pancreatic mass. No pancreatic ductal dilatation. No pancreatic or peripancreatic fluid or inflammatory changes.  Spleen: Unremarkable.  Adrenals/Urinary Tract: Bilateral adrenal glands are normal in appearance. Well-defined low-attenuation lesions in the kidneys bilaterally compatible with simple cysts, largest of which measures up to 4.6 cm  in diameter in the lower pole the right kidney. No hydroureteronephrosis. Urinary bladder is normal in appearance.  Stomach/Bowel: The appearance of the stomach is normal. No pathologic dilatation of small bowel or colon. Numerous colonic diverticulae are noted, particularly in the descending colon and sigmoid colon, without surrounding inflammatory changes to suggest an acute diverticulitis at this time. Normal appendix.  Vascular/Lymphatic: Atherosclerosis throughout the abdominal and pelvic vasculature, without evidence of aneurysm or dissection. Multiple borderline enlarged and enlarged retroperitoneal and mesenteric lymph nodes, significantly increased compared to the recent prior examination. Specific examples include an upper mesenteric lymph node immediately anterior to the head of the pancreas measuring 1.7 cm in short axis (previously 1.2 cm on 08/03/2014), and a 1.8 cm short axis lymph node in the ileocolic mesentery (image 75 of series 2), previously not enlarged. The largest retroperitoneal lymph nodes measure up to 1.1 cm in short axis in the high left para-aortic station. No pelvic lymphadenopathy.  Reproductive: Prostate gland is massively enlarged and heterogeneous in appearance, with profound median lobe hypertrophy, overall measuring 6.7 x 8.3 x 9.9 cm. Seminal vesicles are unremarkable in appearance.  Other: No significant volume of ascites.  No pneumoperitoneum.  Musculoskeletal: Tiny sclerotic lesion measuring 9 mm with narrow zone of transition in the right ischium unchanged compared to numerous prior examinations, presumably a small bone island. There are no other aggressive appearing lytic or blastic lesions noted in the visualized portions of the skeleton.  IMPRESSION: 1. Today's study demonstrates relatively stable left axillary adenopathy, however, the previously described retroperitoneal lymph nodes, and in particular the mesenteric lymph nodes, have significantly increased compared to  the prior examination, strongly suspicious for recurrent lymphoma. 2. Massively enlarged prostate gland, similar to prior examinations. This presumably reflects benign prostatic hypertrophy, but correlation with physical examination and PSA levels is recommended. 3. Colonic diverticulosis, without evidence of acute diverticulitis at this time. 4. Atherosclerosis, including left main and 3 vessel coronary artery disease. Assessment for potential risk factor modification, dietary therapy or pharmacologic therapy may be warranted, if clinically indicated. 5. Additional incidental findings, similar prior studies, as above.   Electronically Signed   By: Vinnie Langton M.D.   On: 11/03/2014 12:44   Ct Abdomen Pelvis W Contrast  11/03/2014   CLINICAL DATA:  Eric [name] with history of lymphoma originally diagnosed in 2009 status post chemotherapy. Restaging examination.  EXAM: CT CHEST, ABDOMEN, AND PELVIS WITH CONTRAST  TECHNIQUE: Multidetector CT imaging of the chest, abdomen and pelvis was performed following the standard protocol during bolus administration of intravenous contrast.  CONTRAST:  119m OMNIPAQUE IOHEXOL 300 MG/ML  SOLN  COMPARISON:  Multiple priors, most recently CT of the chest, abdomen and pelvis 08/03/2014.  FINDINGS: CT CHEST FINDINGS  Mediastinum/Lymph Nodes: Heart size is normal. There is no significant pericardial fluid, thickening or pericardial calcification. There is atherosclerosis of the thoracic aorta, the great vessels of the mediastinum and the coronary arteries, including calcified atherosclerotic plaque in the left  main, left anterior descending, left circumflex and right coronary arteries. No pathologically enlarged mediastinal or hilar lymph nodes. Small hiatal hernia. Status post right axillary nodal dissection. Numerous borderline enlarged and mildly enlarged left axillary lymph nodes measuring up to 12 mm in short axis, similar to the recent prior examination.   Lungs/Pleura: No suspicious appearing pulmonary nodules or masses. No acute consolidative airspace disease. No pleural effusions.  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 There are no aggressive appearing lytic or blastic lesions noted in the visualized portions of the skeleton.  CT ABDOMEN AND PELVIS FINDINGS  Hepatobiliary: No cystic or solid hepatic lesions. No intra or extrahepatic biliary ductal dilatation. Gallbladder is normal in appearance.  Pancreas: No pancreatic mass. No pancreatic ductal dilatation. No pancreatic or peripancreatic fluid or inflammatory changes.  Spleen: Unremarkable.  Adrenals/Urinary Tract: Bilateral adrenal glands are normal in appearance. Well-defined low-attenuation lesions in the kidneys bilaterally compatible with simple cysts, largest of which measures up to 4.6 cm in diameter in the lower pole the right kidney. No hydroureteronephrosis. Urinary bladder is normal in appearance.  Stomach/Bowel: The appearance of the stomach is normal. No pathologic dilatation of small bowel or colon. Numerous colonic diverticulae are noted, particularly in the descending colon and sigmoid colon, without surrounding inflammatory changes to suggest an acute diverticulitis at this time. Normal appendix.  Vascular/Lymphatic: Atherosclerosis throughout the abdominal and pelvic vasculature, without evidence of aneurysm or dissection. Multiple borderline enlarged and enlarged retroperitoneal and mesenteric lymph nodes, significantly increased compared to the recent prior examination. Specific examples include an upper mesenteric lymph node immediately anterior to the head of the pancreas measuring 1.7 cm in short axis (previously 1.2 cm on 08/03/2014), and a 1.8 cm short axis lymph node in the  ileocolic mesentery (image 75 of series 2), previously not enlarged. The largest retroperitoneal lymph nodes measure up to 1.1 cm in short axis in the high left para-aortic station. No pelvic lymphadenopathy.  Reproductive: Prostate gland is massively enlarged and heterogeneous in appearance, with profound median lobe hypertrophy, overall measuring 6.7 x 8.3 x 9.9 cm. Seminal vesicles are unremarkable in appearance.  Other: No significant volume of ascites.  No pneumoperitoneum.  Musculoskeletal: Tiny sclerotic lesion measuring 9 mm with narrow zone of transition in the right ischium unchanged compared to numerous prior examinations, presumably a small bone island. There are no other aggressive appearing lytic or blastic lesions noted in the visualized portions of the skeleton.  IMPRESSION: 1. Today's study demonstrates relatively stable left axillary adenopathy, however, the previously described retroperitoneal lymph nodes, and in particular the mesenteric lymph nodes, have significantly increased compared to the prior examination, strongly suspicious for recurrent lymphoma. 2. Massively enlarged prostate gland, similar to prior examinations. This presumably reflects benign prostatic hypertrophy, but correlation with physical examination and PSA levels is recommended. 3. Colonic diverticulosis, without evidence of acute diverticulitis at this time. 4. Atherosclerosis, including left main and 3 vessel coronary artery disease. Assessment for potential risk factor modification, dietary therapy or pharmacologic therapy may be warranted, if clinically indicated. 5. Additional incidental findings, similar prior studies, as above.   Electronically Signed   By: Vinnie Langton M.D.   On: 11/03/2014 12:44   ASSESSMENT AND PLAN: This is a very pleasant 74 years old [name] with history of bulky stage IV low-grade lymphoma completed maintenance Rituxan  status post 12 cycles. The recent CT scan of the chest, abdomen and  pelvis showed evidence for lymphoma recurrence with enlarged retroperitoneal and mesenteric lymph nodes. The patient was discussed with and also seen by Dr. Julien Nordmann. We'll plan to start the patient back on treatment with Rituxan, initially receiving Rituxan weekly 4 weeks and then we'll go back to receiving Rituxan on an every 2 month basis. First cycle expected 11/22/2014. Patient is in agreement with resuming treatment. He will follow-up in one month for reevaluation prior to initiating every 2 month Rituxan therapy. He was advised to call immediately if he has any concerning symptoms in the interval. The patient voices understanding of current disease status and treatment options and is in agreement with the current care plan.  All questions were answered. The patient knows to call the clinic with any problems, questions or concerns. We can certainly see the patient much sooner if necessary.  Carlton Adam, PA-C 11/15/2014   ADDENDUM: Hematology/Oncology Attending: I had a face to face encounter with the patient. I recommended his care plan. This is a very pleasant 74 years old African-American [name] with history of bulky stage IV low-grade lymphoma status post several regimens in the past including systemic chemotherapy with CHOP/Rituxan followed by maintenance Rituxan followed by treatment with bendamustine and Rituxan followed by another maintenance Rituxan and has been doing fine with no specific complaints. The recent imaging studies showed evidence for disease progression and the retroperitoneal and mesenteric abdominal lymph nodes beds axillary lymph nodes are stable. I had a lengthy discussion with the patient today about his condition. I discussed with him several options for treatment of his condition. I recommended for him to proceed with weekly Rituxan for 4 doses followed by maintenance Rituxan every 2 months. We will continue to monitor his disease closely: Upcoming scans. The  patient is expected to start the first dose of his treatment next week. He would come back for follow-up visit in one month's for reevaluation before initiation of the maintenance Rituxan. The patient was advised to call immediately if he has any concerning symptoms in the interval.  Disclaimer: This note was dictated with voice recognition software. Similar sounding words can inadvertently be transcribed and may be missed upon review. Eilleen Kempf., MD 11/20/2014

## 2014-11-16 ENCOUNTER — Other Ambulatory Visit: Payer: Self-pay

## 2014-11-16 MED ORDER — ATORVASTATIN CALCIUM 40 MG PO TABS: ORAL_TABLET | ORAL | Status: DC

## 2014-11-16 NOTE — Telephone Encounter (Signed)
Belva Crome, MD at 08/20/2014 11:38 AM  atorvastatin (LIPITOR) 40 MG tablet TAKE 1 TABLET (40 MG TOTAL) BY MOUTH DAILY AT 6 PM Current medicines are reviewed at length with the patient today. The patient does not have concerns regarding medicines.  The following changes have been made: Decrease aspirin 81 mg per day. Discontinue Effient. Check hepatic panel and lipid panel today. Notes Recorded by Belva Crome, MD on 08/21/2014 at 1:40 PM Excellent results. Repeat in 1 year

## 2014-11-19 NOTE — Patient Instructions (Signed)
Your recent restaging CT scan revealed evidence for recurrent lymphoma. As discussed you will be restarted on treatment with Rituxan initially weekly for 4 weeks and then back to every 2 month Rituxan. Follow-up in one month

## 2014-11-24 ENCOUNTER — Other Ambulatory Visit (HOSPITAL_BASED_OUTPATIENT_CLINIC_OR_DEPARTMENT_OTHER): Payer: Medicare Other

## 2014-11-24 ENCOUNTER — Ambulatory Visit (HOSPITAL_BASED_OUTPATIENT_CLINIC_OR_DEPARTMENT_OTHER): Payer: Medicare Other

## 2014-11-24 VITALS — BP 110/63 | HR 65 | Temp 97.6°F | Resp 15

## 2014-11-24 DIAGNOSIS — Z5112 Encounter for antineoplastic immunotherapy: Secondary | ICD-10-CM | POA: Diagnosis not present

## 2014-11-24 DIAGNOSIS — C8590 Non-Hodgkin lymphoma, unspecified, unspecified site: Secondary | ICD-10-CM | POA: Diagnosis not present

## 2014-11-24 LAB — COMPREHENSIVE METABOLIC PANEL (CC13)
ALK PHOS: 105 U/L (ref 40–150)
ALT: 17 U/L (ref 0–55)
ANION GAP: 8 meq/L (ref 3–11)
AST: 24 U/L (ref 5–34)
Albumin: 4 g/dL (ref 3.5–5.0)
BUN: 11.9 mg/dL (ref 7.0–26.0)
CHLORIDE: 109 meq/L (ref 98–109)
CO2: 25 meq/L (ref 22–29)
CREATININE: 1.1 mg/dL (ref 0.7–1.3)
Calcium: 9.2 mg/dL (ref 8.4–10.4)
EGFR: 69 mL/min/{1.73_m2} — ABNORMAL LOW (ref >90)
Glucose: 93 mg/dl (ref 70–140)
POTASSIUM: 4 meq/L (ref 3.5–5.1)
Sodium: 142 mEq/L (ref 136–145)
Total Bilirubin: 0.57 mg/dL (ref 0.20–1.20)
Total Protein: 6.4 g/dL (ref 6.4–8.3)

## 2014-11-24 LAB — LACTATE DEHYDROGENASE (CC13): LDH: 239 U/L (ref 125–245)

## 2014-11-24 LAB — CBC WITH DIFFERENTIAL/PLATELET
BASO%: 0.5 % (ref 0.0–2.0)
Basophils Absolute: 0 10*3/uL (ref 0.0–0.1)
EOS ABS: 0.1 10*3/uL (ref 0.0–0.5)
EOS%: 3 % (ref 0.0–7.0)
HCT: 42.7 % (ref 38.4–49.9)
HGB: 14.7 g/dL (ref 13.0–17.1)
LYMPH%: 26 % (ref 14.0–49.0)
MCH: 30.9 pg (ref 27.2–33.4)
MCHC: 34.4 g/dL (ref 32.0–36.0)
MCV: 89.9 fL (ref 79.3–98.0)
MONO#: 0.6 10*3/uL (ref 0.1–0.9)
MONO%: 16.1 % — AB (ref 0.0–14.0)
NEUT%: 54.4 % (ref 39.0–75.0)
NEUTROS ABS: 2 10*3/uL (ref 1.5–6.5)
Platelets: 182 10*3/uL (ref 140–400)
RBC: 4.75 10*6/uL (ref 4.20–5.82)
RDW: 13.6 % (ref 11.0–14.6)
WBC: 3.7 10*3/uL — AB (ref 4.0–10.3)
lymph#: 1 10*3/uL (ref 0.9–3.3)

## 2014-11-24 MED ORDER — DIPHENHYDRAMINE HCL 25 MG PO CAPS
50.0000 mg | ORAL_CAPSULE | Freq: Once | ORAL | Status: AC
  Administered 2014-11-24: 50 mg via ORAL

## 2014-11-24 MED ORDER — SODIUM CHLORIDE 0.9 % IV SOLN
375.0000 mg/m2 | Freq: Once | INTRAVENOUS | Status: AC
  Administered 2014-11-24: 700 mg via INTRAVENOUS
  Filled 2014-11-24: qty 70

## 2014-11-24 MED ORDER — SODIUM CHLORIDE 0.9 % IV SOLN
Freq: Once | INTRAVENOUS | Status: AC
  Administered 2014-11-24: 12:00:00 via INTRAVENOUS

## 2014-11-24 MED ORDER — ACETAMINOPHEN 325 MG PO TABS
650.0000 mg | ORAL_TABLET | Freq: Once | ORAL | Status: AC
  Administered 2014-11-24: 650 mg via ORAL

## 2014-11-24 MED ORDER — ACETAMINOPHEN 325 MG PO TABS
ORAL_TABLET | ORAL | Status: AC
Start: 2014-11-24 — End: 2014-11-24
  Filled 2014-11-24: qty 2

## 2014-11-24 MED ORDER — DIPHENHYDRAMINE HCL 25 MG PO CAPS
ORAL_CAPSULE | ORAL | Status: AC
  Filled 2014-11-24: qty 2

## 2014-11-24 NOTE — Patient Instructions (Signed)
[facility] Discharge Instructions for Patients Receiving Chemotherapy  Today you received the following chemotherapy agents - Rituxan  If you develop nausea and vomiting that is not controlled by your nausea medication, call the clinic.   BELOW ARE SYMPTOMS THAT SHOULD BE REPORTED IMMEDIATELY:  *FEVER GREATER THAN 100.5 F  *CHILLS WITH OR WITHOUT FEVER  NAUSEA AND VOMITING THAT IS NOT CONTROLLED WITH YOUR NAUSEA MEDICATION  *UNUSUAL SHORTNESS OF BREATH  *UNUSUAL BRUISING OR BLEEDING  TENDERNESS IN MOUTH AND THROAT WITH OR WITHOUT PRESENCE OF ULCERS  *URINARY PROBLEMS  *BOWEL PROBLEMS  UNUSUAL RASH Items with * indicate a potential emergency and should be followed up as soon as possible.  Feel free to call the clinic should you have any questions or concerns. The clinic phone number is [phone].  Please show the [place] at check-in to the Emergency Department and triage nurse.

## 2014-12-01 ENCOUNTER — Ambulatory Visit (HOSPITAL_BASED_OUTPATIENT_CLINIC_OR_DEPARTMENT_OTHER): Payer: Medicare Other | Admitting: Physician Assistant

## 2014-12-01 ENCOUNTER — Other Ambulatory Visit (HOSPITAL_BASED_OUTPATIENT_CLINIC_OR_DEPARTMENT_OTHER): Payer: Medicare Other

## 2014-12-01 ENCOUNTER — Ambulatory Visit (HOSPITAL_BASED_OUTPATIENT_CLINIC_OR_DEPARTMENT_OTHER): Payer: Medicare Other

## 2014-12-01 ENCOUNTER — Encounter: Payer: Self-pay | Admitting: Physician Assistant

## 2014-12-01 VITALS — BP 133/75 | HR 65 | Temp 98.4°F | Resp 18 | Ht 67.0 in | Wt 153.6 lb

## 2014-12-01 VITALS — BP 108/69 | HR 60 | Temp 98.1°F | Resp 20

## 2014-12-01 DIAGNOSIS — Z5112 Encounter for antineoplastic immunotherapy: Secondary | ICD-10-CM | POA: Diagnosis not present

## 2014-12-01 DIAGNOSIS — C8590 Non-Hodgkin lymphoma, unspecified, unspecified site: Secondary | ICD-10-CM

## 2014-12-01 LAB — CBC WITH DIFFERENTIAL/PLATELET
BASO%: 0.8 % (ref 0.0–2.0)
Basophils Absolute: 0 10*3/uL (ref 0.0–0.1)
EOS%: 3 % (ref 0.0–7.0)
Eosinophils Absolute: 0.1 10*3/uL (ref 0.0–0.5)
HEMATOCRIT: 43.8 % (ref 38.4–49.9)
HEMOGLOBIN: 14.9 g/dL (ref 13.0–17.1)
LYMPH#: 1 10*3/uL (ref 0.9–3.3)
LYMPH%: 25.8 % (ref 14.0–49.0)
MCH: 30.7 pg (ref 27.2–33.4)
MCHC: 34 g/dL (ref 32.0–36.0)
MCV: 90.3 fL (ref 79.3–98.0)
MONO#: 0.6 10*3/uL (ref 0.1–0.9)
MONO%: 14.9 % — ABNORMAL HIGH (ref 0.0–14.0)
NEUT%: 55.5 % (ref 39.0–75.0)
NEUTROS ABS: 2.2 10*3/uL (ref 1.5–6.5)
Platelets: 187 10*3/uL (ref 140–400)
RBC: 4.85 10*6/uL (ref 4.20–5.82)
RDW: 13.8 % (ref 11.0–14.6)
WBC: 4 10*3/uL (ref 4.0–10.3)

## 2014-12-01 LAB — COMPREHENSIVE METABOLIC PANEL (CC13)
ALBUMIN: 3.8 g/dL (ref 3.5–5.0)
ALK PHOS: 107 U/L (ref 40–150)
ALT: 16 U/L (ref 0–55)
AST: 22 U/L (ref 5–34)
Anion Gap: 6 mEq/L (ref 3–11)
BUN: 11.8 mg/dL (ref 7.0–26.0)
CALCIUM: 8.9 mg/dL (ref 8.4–10.4)
CHLORIDE: 109 meq/L (ref 98–109)
CO2: 27 mEq/L (ref 22–29)
CREATININE: 1.1 mg/dL (ref 0.7–1.3)
EGFR: 66 mL/min/{1.73_m2} — ABNORMAL LOW (ref >90)
GLUCOSE: 119 mg/dL (ref 70–140)
Potassium: 3.9 mEq/L (ref 3.5–5.1)
Sodium: 141 mEq/L (ref 136–145)
Total Bilirubin: 0.52 mg/dL (ref 0.20–1.20)
Total Protein: 6 g/dL — ABNORMAL LOW (ref 6.4–8.3)

## 2014-12-01 MED ORDER — DIPHENHYDRAMINE HCL 25 MG PO CAPS
ORAL_CAPSULE | ORAL | Status: AC
  Filled 2014-12-01: qty 2

## 2014-12-01 MED ORDER — SODIUM CHLORIDE 0.9 % IV SOLN
375.0000 mg/m2 | Freq: Once | INTRAVENOUS | Status: AC
  Administered 2014-12-01: 700 mg via INTRAVENOUS
  Filled 2014-12-01: qty 60

## 2014-12-01 MED ORDER — ACETAMINOPHEN 325 MG PO TABS
650.0000 mg | ORAL_TABLET | Freq: Once | ORAL | Status: AC
  Administered 2014-12-01: 650 mg via ORAL

## 2014-12-01 MED ORDER — DIPHENHYDRAMINE HCL 25 MG PO CAPS
50.0000 mg | ORAL_CAPSULE | Freq: Once | ORAL | Status: AC
  Administered 2014-12-01: 50 mg via ORAL

## 2014-12-01 MED ORDER — SODIUM CHLORIDE 0.9 % IV SOLN
Freq: Once | INTRAVENOUS | Status: AC
  Administered 2014-12-01: 14:00:00 via INTRAVENOUS

## 2014-12-01 MED ORDER — ACETAMINOPHEN 325 MG PO TABS
ORAL_TABLET | ORAL | Status: AC
  Filled 2014-12-01: qty 2

## 2014-12-01 NOTE — Patient Instructions (Signed)
Continue weekly Rituxan as scheduled Follow up in 2 weeks

## 2014-12-01 NOTE — Patient Instructions (Signed)
[facility] Discharge Instructions for Patients Receiving Chemotherapy  Today you received the following chemotherapy agents: Rituxan   To help prevent nausea and vomiting after your treatment, we encourage you to take your nausea medication as directed.    If you develop nausea and vomiting that is not controlled by your nausea medication, call the clinic.   BELOW ARE SYMPTOMS THAT SHOULD BE REPORTED IMMEDIATELY:  *FEVER GREATER THAN 100.5 F  *CHILLS WITH OR WITHOUT FEVER  NAUSEA AND VOMITING THAT IS NOT CONTROLLED WITH YOUR NAUSEA MEDICATION  *UNUSUAL SHORTNESS OF BREATH  *UNUSUAL BRUISING OR BLEEDING  TENDERNESS IN MOUTH AND THROAT WITH OR WITHOUT PRESENCE OF ULCERS  *URINARY PROBLEMS  *BOWEL PROBLEMS  UNUSUAL RASH Items with * indicate a potential emergency and should be followed up as soon as possible.  Feel free to call the clinic you have any questions or concerns. The clinic phone number is [phone].  Please show the CHEMO ALERT CARD at check-in to the Emergency Department and triage nurse.   

## 2014-12-01 NOTE — Progress Notes (Addendum)
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   PRIOR THERAPY:  1. Status post 7 cycles of systemic chemotherapy with CHOP/Rituxan. Last dose was given May 2010. 2. Status post 6 cycles of maintenance Rituxan 375 mg/sq m given every 2 months. Last dose was given on May 29, 2010, and the patient was lost to followup at that time. He received another dose on 12/11/2010, then again missed 2 doses. 3. systemic chemotherapy with Rituxan 375 mg/M2 on day 1 and that bendamustine 90 mg/M2 on days 1 and 2 every 4 weeks, status post 6 cycles. 4. Maintenance Rituxan 375 mg/M2 every 2 months, status post 12 cycles   CURRENT THERAPY: Rituxan weekly x 4, then every 2 months  INTERVAL HISTORY: Eric [name] 74 y.o. male returns to the clinic today for followup visit. The patient is feeling fine today with no specific complaints. He tolerated his first weekly Rituxan ( #1 of 4)  Without difficulty. He denied having any significant chest pain, shortness of breath, cough or hemoptysis. He denied having any night sweats.  He has no fever or chills, no nausea or vomiting. He has no lymphadenopathy. He requests a flu shot today.  MEDICAL HISTORY: Past Medical History  Diagnosis Date  . History of heart attack   . Cancer   . Non Hodgkin's lymphoma   . Coronary atherosclerosis of native coronary artery   . Hyperlipidemia     ALLERGIES:  is allergic to neosporin.  MEDICATIONS:  Current Outpatient Prescriptions  Medication Sig Dispense Refill  . aspirin EC 325 MG EC tablet Take 1 tablet (325 mg total) by mouth daily. 30 tablet   . atorvastatin (LIPITOR) 40 MG tablet TAKE 1 TABLET (40 MG TOTAL) BY MOUTH DAILY AT 6 PM. 901 tablet 1  . diphenhydrAMINE (BENADRYL) 25 MG tablet Take 25 mg by mouth at  bedtime as needed. Sleep    . nitroGLYCERIN (NITROSTAT) 0.4 MG SL tablet Place 1 tablet (0.4 mg total) under the tongue every 5 (five) minutes x 3 doses as needed for chest pain. 25 tablet 3  . sildenafil (VIAGRA) 25 MG tablet Take 25 mg by mouth daily as needed. Take 2-3 tablets by mouth once daily as needed.     No current facility-administered medications for this visit.    SURGICAL HISTORY:  Past Surgical History  Procedure Laterality Date  . Coronary stent placement    . Left heart catheterization with coronary angiogram N/A 03/03/2012    Procedure: LEFT HEART CATHETERIZATION WITH CORONARY ANGIOGRAM;  Surgeon: Eric Furbish, MD;  Location: [facility] CATH LAB;  Service: Cardiovascular;  Laterality: N/A;  . Percutaneous coronary stent intervention (pci-s) N/A 03/04/2012    Procedure: PERCUTANEOUS CORONARY STENT INTERVENTION (PCI-S);  Surgeon: Eric Grooms, MD;  Location: [facility] CATH LAB;  Service: Cardiovascular;  Laterality: N/A;    REVIEW OF SYSTEMS:  Constitutional: negative Eyes: negative Ears, nose, mouth, throat, and face: negative Respiratory: negative Cardiovascular: negative Gastrointestinal: negative Genitourinary:negative Integument/breast: negative Hematologic/lymphatic: negative Musculoskeletal:negative Neurological: negative Behavioral/Psych: negative Endocrine: negative Allergic/Immunologic: negative   PHYSICAL EXAMINATION: General appearance: alert, cooperative and no distress Head: Normocephalic, without obvious abnormality, atraumatic Neck: no adenopathy Lymph nodes: Cervical, supraclavicular, and axillary nodes normal. Resp: clear to auscultation bilaterally Back: symmetric, no curvature. ROM normal. No CVA tenderness. Cardio: regular rate  and rhythm, S1, S2 normal, no murmur, click, rub or gallop GI: soft, non-tender; bowel sounds normal; no masses,  no organomegaly Extremities: extremities normal, atraumatic, no cyanosis or edema Neurologic: Alert and oriented  X 3, normal strength and tone. Normal symmetric reflexes. Normal coordination and gait  ECOG PERFORMANCE STATUS: 0 - Asymptomatic  Blood pressure 133/75, pulse 65, temperature 98.4 F (36.9 C), temperature source Oral, resp. rate 18, height '5\' 7"'$  (1.702 m), weight 153 lb 9.6 oz (69.673 kg), SpO2 100 %.  LABORATORY DATA: Lab Results  Component Value Date   WBC 4.0 12/01/2014   HGB 14.9 12/01/2014   HCT 43.8 12/01/2014   MCV 90.3 12/01/2014   PLT 187 12/01/2014      Chemistry      Component Value Date/Time   NA 141 12/01/2014 1155   NA 142 03/05/2012 0545   NA 141 03/23/2010 1301   K 3.9 12/01/2014 1155   K 3.3* 03/05/2012 0545   K 4.7 03/23/2010 1301   CL 106 08/06/2012 1244   CL 107 03/05/2012 0545   CL 99 03/23/2010 1301   CO2 27 12/01/2014 1155   CO2 23 03/05/2012 0545   CO2 26 03/23/2010 1301   BUN 11.8 12/01/2014 1155   BUN 9 03/05/2012 0545   BUN 12 03/23/2010 1301   CREATININE 1.1 12/01/2014 1155   CREATININE 0.92 03/05/2012 0545   CREATININE 1.0 03/23/2010 1301      Component Value Date/Time   CALCIUM 8.9 12/01/2014 1155   CALCIUM 8.5 03/05/2012 0545   CALCIUM 9.1 03/23/2010 1301   ALKPHOS 107 12/01/2014 1155   ALKPHOS 86 08/20/2014 1220   ALKPHOS 78 03/23/2010 1301   AST 22 12/01/2014 1155   AST 20 08/20/2014 1220   AST 25 03/23/2010 1301   ALT 16 12/01/2014 1155   ALT 12 08/20/2014 1220   ALT 20 03/23/2010 1301   BILITOT 0.52 12/01/2014 1155   BILITOT 0.6 08/20/2014 1220   BILITOT 0.60 03/23/2010 1301       RADIOGRAPHIC STUDIES: Ct Chest W Contrast  11/03/2014   CLINICAL DATA:  74 year old male with history of lymphoma originally diagnosed in 2009 status post chemotherapy. Restaging examination.  EXAM: CT CHEST, ABDOMEN, AND PELVIS WITH CONTRAST  TECHNIQUE: Multidetector CT imaging of the chest, abdomen and pelvis was performed following the standard protocol during bolus administration of intravenous contrast.  CONTRAST:  130m OMNIPAQUE IOHEXOL  300 MG/ML  SOLN  COMPARISON:  Multiple priors, most recently CT of the chest, abdomen and pelvis 08/03/2014.  FINDINGS: CT CHEST FINDINGS  Mediastinum/Lymph Nodes: Heart size is normal. There is no significant pericardial fluid, thickening or pericardial calcification. There is atherosclerosis of the thoracic aorta, the great vessels of the mediastinum and the coronary arteries, including calcified atherosclerotic plaque in the left main, left anterior descending, left circumflex and right coronary arteries. No pathologically enlarged mediastinal or hilar lymph nodes. Small hiatal hernia. Status post right axillary nodal dissection. Numerous borderline enlarged and mildly enlarged left axillary lymph nodes measuring up to 12 mm in short axis, similar to the recent prior examination.  Lungs/Pleura: No suspicious appearing pulmonary nodules or masses. No acute consolidative airspace disease. No pleural effusions.  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 There are no aggressive appearing lytic or blastic lesions noted in the visualized portions of the  skeleton.  CT ABDOMEN AND PELVIS FINDINGS  Hepatobiliary: No cystic or solid hepatic lesions. No intra or extrahepatic biliary ductal dilatation. Gallbladder is normal in appearance.  Pancreas: No pancreatic mass. No pancreatic ductal dilatation. No pancreatic or peripancreatic fluid or inflammatory changes.  Spleen: Unremarkable.  Adrenals/Urinary Tract: Bilateral adrenal glands are normal in appearance. Well-defined low-attenuation lesions in the kidneys bilaterally compatible with simple cysts, largest of which measures up to 4.6 cm in diameter in the lower pole the right kidney. No hydroureteronephrosis. Urinary bladder is normal in appearance.  Stomach/Bowel:  The appearance of the stomach is normal. No pathologic dilatation of small bowel or colon. Numerous colonic diverticulae are noted, particularly in the descending colon and sigmoid colon, without surrounding inflammatory changes to suggest an acute diverticulitis at this time. Normal appendix.  Vascular/Lymphatic: Atherosclerosis throughout the abdominal and pelvic vasculature, without evidence of aneurysm or dissection. Multiple borderline enlarged and enlarged retroperitoneal and mesenteric lymph nodes, significantly increased compared to the recent prior examination. Specific examples include an upper mesenteric lymph node immediately anterior to the head of the pancreas measuring 1.7 cm in short axis (previously 1.2 cm on 08/03/2014), and a 1.8 cm short axis lymph node in the ileocolic mesentery (image 75 of series 2), previously not enlarged. The largest retroperitoneal lymph nodes measure up to 1.1 cm in short axis in the high left para-aortic station. No pelvic lymphadenopathy.  Reproductive: Prostate gland is massively enlarged and heterogeneous in appearance, with profound median lobe hypertrophy, overall measuring 6.7 x 8.3 x 9.9 cm. Seminal vesicles are unremarkable in appearance.  Other: No significant volume of ascites.  No pneumoperitoneum.  Musculoskeletal: Tiny sclerotic lesion measuring 9 mm with narrow zone of transition in the right ischium unchanged compared to numerous prior examinations, presumably a small bone island. There are no other aggressive appearing lytic or blastic lesions noted in the visualized portions of the skeleton.  IMPRESSION: 1. Today's study demonstrates relatively stable left axillary adenopathy, however, the previously described retroperitoneal lymph nodes, and in particular the mesenteric lymph nodes, have significantly increased compared to the prior examination, strongly suspicious for recurrent lymphoma. 2. Massively enlarged prostate gland, similar to prior  examinations. This presumably reflects benign prostatic hypertrophy, but correlation with physical examination and PSA levels is recommended. 3. Colonic diverticulosis, without evidence of acute diverticulitis at this time. 4. Atherosclerosis, including left main and 3 vessel coronary artery disease. Assessment for potential risk factor modification, dietary therapy or pharmacologic therapy may be warranted, if clinically indicated. 5. Additional incidental findings, similar prior studies, as above.   Electronically Signed   By: Eric [name] M.D.   On: 11/03/2014 12:44   Ct Abdomen Pelvis W Contrast  11/03/2014   CLINICAL DATA:  74 year old male with history of lymphoma originally diagnosed in 2009 status post chemotherapy. Restaging examination.  EXAM: CT CHEST, ABDOMEN, AND PELVIS WITH CONTRAST  TECHNIQUE: Multidetector CT imaging of the chest, abdomen and pelvis was performed following the standard protocol during bolus administration of intravenous contrast.  CONTRAST:  170m OMNIPAQUE IOHEXOL 300 MG/ML  SOLN  COMPARISON:  Multiple priors, most recently CT of the chest, abdomen and pelvis 08/03/2014.  FINDINGS: CT CHEST FINDINGS  Mediastinum/Lymph Nodes: Heart size is normal. There is no significant pericardial fluid, thickening or pericardial calcification. There is atherosclerosis of the thoracic aorta, the great vessels of the mediastinum and the coronary arteries, including calcified atherosclerotic plaque in the left main, left anterior descending, left circumflex and right coronary arteries. No pathologically enlarged mediastinal  or hilar lymph nodes. Small hiatal hernia. Status post right axillary nodal dissection. Numerous borderline enlarged and mildly enlarged left axillary lymph nodes measuring up to 12 mm in short axis, similar to the recent prior examination.  Lungs/Pleura: No suspicious appearing pulmonary nodules or masses. No acute consolidative airspace disease. No pleural effusions.   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 There are no aggressive appearing lytic or blastic lesions noted in the visualized portions of the skeleton.  CT ABDOMEN AND PELVIS FINDINGS  Hepatobiliary: No cystic or solid hepatic lesions. No intra or extrahepatic biliary ductal dilatation. Gallbladder is normal in appearance.  Pancreas: No pancreatic mass. No pancreatic ductal dilatation. No pancreatic or peripancreatic fluid or inflammatory changes.  Spleen: Unremarkable.  Adrenals/Urinary Tract: Bilateral adrenal glands are normal in appearance. Well-defined low-attenuation lesions in the kidneys bilaterally compatible with simple cysts, largest of which measures up to 4.6 cm in diameter in the lower pole the right kidney. No hydroureteronephrosis. Urinary bladder is normal in appearance.  Stomach/Bowel: The appearance of the stomach is normal. No pathologic dilatation of small bowel or colon. Numerous colonic diverticulae are noted, particularly in the descending colon and sigmoid colon, without surrounding inflammatory changes to suggest an acute diverticulitis at this time. Normal appendix.  Vascular/Lymphatic: Atherosclerosis throughout the abdominal and pelvic vasculature, without evidence of aneurysm or dissection. Multiple borderline enlarged and enlarged retroperitoneal and mesenteric lymph nodes, significantly increased compared to the recent prior examination. Specific examples include an upper mesenteric lymph node immediately anterior to the head of the pancreas measuring 1.7 cm in short axis (previously 1.2 cm on 08/03/2014), and a 1.8 cm short axis lymph node in the ileocolic mesentery (image 75 of series 2), previously not enlarged. The largest retroperitoneal lymph nodes measure up to 1.1 cm in  short axis in the high left para-aortic station. No pelvic lymphadenopathy.  Reproductive: Prostate gland is massively enlarged and heterogeneous in appearance, with profound median lobe hypertrophy, overall measuring 6.7 x 8.3 x 9.9 cm. Seminal vesicles are unremarkable in appearance.  Other: No significant volume of ascites.  No pneumoperitoneum.  Musculoskeletal: Tiny sclerotic lesion measuring 9 mm with narrow zone of transition in the right ischium unchanged compared to numerous prior examinations, presumably a small bone island. There are no other aggressive appearing lytic or blastic lesions noted in the visualized portions of the skeleton.  IMPRESSION: 1. Today's study demonstrates relatively stable left axillary adenopathy, however, the previously described retroperitoneal lymph nodes, and in particular the mesenteric lymph nodes, have significantly increased compared to the prior examination, strongly suspicious for recurrent lymphoma. 2. Massively enlarged prostate gland, similar to prior examinations. This presumably reflects benign prostatic hypertrophy, but correlation with physical examination and PSA levels is recommended. 3. Colonic diverticulosis, without evidence of acute diverticulitis at this time. 4. Atherosclerosis, including left main and 3 vessel coronary artery disease. Assessment for potential risk factor modification, dietary therapy or pharmacologic therapy may be warranted, if clinically indicated. 5. Additional incidental findings, similar prior studies, as above.   Electronically Signed   By: Eric [name] M.D.   On: 11/03/2014 12:44   ASSESSMENT AND PLAN: This is a very pleasant 74 years old male with history of bulky stage IV low-grade lymphoma completed maintenance Rituxan status post 12 cycles. The recent CT scan of the chest, abdomen and pelvis  showed evidence for lymphoma recurrence with enlarged retroperitoneal and mesenteric lymph nodes. The patient was discussed with  and also seen by Dr. Julien [name]. We'll plan to start the patient back on treatment with Rituxan, initially receiving Rituxan weekly 4 weeks and then we'll go back to receiving Rituxan on an every 2 month basis. First cycle given 11/22/2014. He will proceed with #2 of 4 weekly Rituxan treatments today as scheduled. He will follow up with Dr. Julien [name] in 2 weeks for re-evaluation and to be scheduled for his first every 2 month Rituxan treatment. He will be given a flu vaccine today. He was advised to call immediately if he has any concerning symptoms in the interval. The patient voices understanding of current disease status and treatment options and is in agreement with the current care plan.  All questions were answered. The patient knows to call the clinic with any problems, questions or concerns. We can certainly see the patient much sooner if necessary.  Eric Adam, PA-C 12/01/2014   ADDENDUM: Hematology/Oncology Attending: I had a face to face encounter with the patient. I recommended his care plan. This is a very pleasant 74 years old African-American male with bulky stage IV low-grade lymphoma diagnosed in November 2009 with recent evidence of recurrence in the abdominal lymphadenopathy. The patient was started on treatment with Rituxan weekly status post 1 cycle and he is here today to start cycle #2. He tolerated the first week of his treatment fairly well with no significant adverse effects. I recommended for the patient to proceed with cycle #2 today as scheduled. He would come back for follow-up visit in 2 weeks for reevaluation and discussion of maintenance treatment after the initial course of weekly Rituxan. The patient was advised to call immediately if he has any concerning symptoms in the interval.  Disclaimer: This note was dictated with voice recognition software. Similar sounding words can inadvertently be transcribed and may not be corrected upon review.  Eric Kempf.,  MD 12/03/2014

## 2014-12-08 ENCOUNTER — Other Ambulatory Visit (HOSPITAL_BASED_OUTPATIENT_CLINIC_OR_DEPARTMENT_OTHER): Payer: Medicare Other

## 2014-12-08 ENCOUNTER — Ambulatory Visit (HOSPITAL_BASED_OUTPATIENT_CLINIC_OR_DEPARTMENT_OTHER): Payer: Medicare Other

## 2014-12-08 VITALS — BP 110/59 | HR 73 | Temp 98.3°F | Resp 17

## 2014-12-08 DIAGNOSIS — C8590 Non-Hodgkin lymphoma, unspecified, unspecified site: Secondary | ICD-10-CM | POA: Diagnosis not present

## 2014-12-08 DIAGNOSIS — Z5112 Encounter for antineoplastic immunotherapy: Secondary | ICD-10-CM | POA: Diagnosis not present

## 2014-12-08 LAB — CBC WITH DIFFERENTIAL/PLATELET
BASO%: 0.7 % (ref 0.0–2.0)
Basophils Absolute: 0 10*3/uL (ref 0.0–0.1)
EOS%: 3.7 % (ref 0.0–7.0)
Eosinophils Absolute: 0.2 10*3/uL (ref 0.0–0.5)
HEMATOCRIT: 42 % (ref 38.4–49.9)
HGB: 14.4 g/dL (ref 13.0–17.1)
LYMPH%: 27.2 % (ref 14.0–49.0)
MCH: 30.8 pg (ref 27.2–33.4)
MCHC: 34.3 g/dL (ref 32.0–36.0)
MCV: 89.9 fL (ref 79.3–98.0)
MONO#: 0.7 10*3/uL (ref 0.1–0.9)
MONO%: 18.1 % — AB (ref 0.0–14.0)
NEUT%: 50.3 % (ref 39.0–75.0)
NEUTROS ABS: 2.1 10*3/uL (ref 1.5–6.5)
Platelets: 202 10*3/uL (ref 140–400)
RBC: 4.67 10*6/uL (ref 4.20–5.82)
RDW: 13.7 % (ref 11.0–14.6)
WBC: 4.1 10*3/uL (ref 4.0–10.3)
lymph#: 1.1 10*3/uL (ref 0.9–3.3)
nRBC: 0 % (ref 0–0)

## 2014-12-08 LAB — COMPREHENSIVE METABOLIC PANEL (CC13)
ALT: 20 U/L (ref 0–55)
AST: 25 U/L (ref 5–34)
Albumin: 3.7 g/dL (ref 3.5–5.0)
Alkaline Phosphatase: 99 U/L (ref 40–150)
Anion Gap: 8 mEq/L (ref 3–11)
BUN: 11.8 mg/dL (ref 7.0–26.0)
CHLORIDE: 108 meq/L (ref 98–109)
CO2: 26 meq/L (ref 22–29)
Calcium: 9.2 mg/dL (ref 8.4–10.4)
Creatinine: 1 mg/dL (ref 0.7–1.3)
EGFR: 79 mL/min/{1.73_m2} — ABNORMAL LOW (ref >90)
GLUCOSE: 109 mg/dL (ref 70–140)
POTASSIUM: 4.1 meq/L (ref 3.5–5.1)
SODIUM: 142 meq/L (ref 136–145)
Total Bilirubin: 0.47 mg/dL (ref 0.20–1.20)
Total Protein: 5.9 g/dL — ABNORMAL LOW (ref 6.4–8.3)

## 2014-12-08 LAB — LACTATE DEHYDROGENASE (CC13): LDH: 252 U/L — ABNORMAL HIGH (ref 125–245)

## 2014-12-08 MED ORDER — DIPHENHYDRAMINE HCL 25 MG PO CAPS
ORAL_CAPSULE | ORAL | Status: AC
  Filled 2014-12-08: qty 2

## 2014-12-08 MED ORDER — SODIUM CHLORIDE 0.9 % IV SOLN
Freq: Once | INTRAVENOUS | Status: AC
  Administered 2014-12-08: 12:00:00 via INTRAVENOUS

## 2014-12-08 MED ORDER — ACETAMINOPHEN 325 MG PO TABS
ORAL_TABLET | ORAL | Status: AC
  Filled 2014-12-08: qty 2

## 2014-12-08 MED ORDER — DIPHENHYDRAMINE HCL 25 MG PO CAPS
50.0000 mg | ORAL_CAPSULE | Freq: Once | ORAL | Status: AC
  Administered 2014-12-08: 50 mg via ORAL

## 2014-12-08 MED ORDER — SODIUM CHLORIDE 0.9 % IV SOLN
375.0000 mg/m2 | Freq: Once | INTRAVENOUS | Status: AC
  Administered 2014-12-08: 700 mg via INTRAVENOUS
  Filled 2014-12-08: qty 70

## 2014-12-08 MED ORDER — ACETAMINOPHEN 325 MG PO TABS
650.0000 mg | ORAL_TABLET | Freq: Once | ORAL | Status: AC
Start: 2014-12-08 — End: 2014-12-08
  Administered 2014-12-08: 650 mg via ORAL

## 2014-12-10 ENCOUNTER — Other Ambulatory Visit: Payer: Self-pay | Admitting: Interventional Cardiology

## 2014-12-10 ENCOUNTER — Other Ambulatory Visit: Payer: Self-pay

## 2014-12-15 ENCOUNTER — Other Ambulatory Visit (HOSPITAL_BASED_OUTPATIENT_CLINIC_OR_DEPARTMENT_OTHER): Payer: Medicare Other

## 2014-12-15 ENCOUNTER — Telehealth: Payer: Self-pay | Admitting: Internal Medicine

## 2014-12-15 ENCOUNTER — Ambulatory Visit (HOSPITAL_BASED_OUTPATIENT_CLINIC_OR_DEPARTMENT_OTHER): Payer: Medicare Other | Admitting: Internal Medicine

## 2014-12-15 ENCOUNTER — Ambulatory Visit (HOSPITAL_BASED_OUTPATIENT_CLINIC_OR_DEPARTMENT_OTHER): Payer: Medicare Other

## 2014-12-15 ENCOUNTER — Encounter: Payer: Self-pay | Admitting: Internal Medicine

## 2014-12-15 VITALS — BP 120/63 | HR 63 | Temp 97.3°F | Resp 16

## 2014-12-15 VITALS — BP 122/82 | HR 66 | Temp 97.8°F | Resp 18 | Ht 67.0 in | Wt 154.9 lb

## 2014-12-15 DIAGNOSIS — Z5112 Encounter for antineoplastic immunotherapy: Secondary | ICD-10-CM | POA: Diagnosis not present

## 2014-12-15 DIAGNOSIS — C8590 Non-Hodgkin lymphoma, unspecified, unspecified site: Secondary | ICD-10-CM

## 2014-12-15 DIAGNOSIS — C8208 Follicular lymphoma grade I, lymph nodes of multiple sites: Secondary | ICD-10-CM

## 2014-12-15 LAB — COMPREHENSIVE METABOLIC PANEL (CC13)
ALBUMIN: 4.1 g/dL (ref 3.5–5.0)
ALK PHOS: 111 U/L (ref 40–150)
ALT: 18 U/L (ref 0–55)
ANION GAP: 10 meq/L (ref 3–11)
AST: 24 U/L (ref 5–34)
BUN: 14.8 mg/dL (ref 7.0–26.0)
CALCIUM: 9.4 mg/dL (ref 8.4–10.4)
CO2: 24 mEq/L (ref 22–29)
CREATININE: 1.1 mg/dL (ref 0.7–1.3)
Chloride: 108 mEq/L (ref 98–109)
EGFR: 69 mL/min/{1.73_m2} — ABNORMAL LOW (ref >90)
Glucose: 75 mg/dl (ref 70–140)
Potassium: 4.2 mEq/L (ref 3.5–5.1)
Sodium: 142 mEq/L (ref 136–145)
Total Bilirubin: 0.53 mg/dL (ref 0.20–1.20)
Total Protein: 6.6 g/dL (ref 6.4–8.3)

## 2014-12-15 LAB — CBC WITH DIFFERENTIAL/PLATELET
BASO%: 0.7 % (ref 0.0–2.0)
Basophils Absolute: 0 10*3/uL (ref 0.0–0.1)
EOS%: 2.3 % (ref 0.0–7.0)
Eosinophils Absolute: 0.1 10*3/uL (ref 0.0–0.5)
HEMATOCRIT: 44.3 % (ref 38.4–49.9)
HGB: 15.1 g/dL (ref 13.0–17.1)
LYMPH%: 27.4 % (ref 14.0–49.0)
MCH: 31 pg (ref 27.2–33.4)
MCHC: 34.1 g/dL (ref 32.0–36.0)
MCV: 91 fL (ref 79.3–98.0)
MONO#: 1 10*3/uL — ABNORMAL HIGH (ref 0.1–0.9)
MONO%: 21.7 % — ABNORMAL HIGH (ref 0.0–14.0)
NEUT%: 47.9 % (ref 39.0–75.0)
NEUTROS ABS: 2.1 10*3/uL (ref 1.5–6.5)
PLATELETS: 184 10*3/uL (ref 140–400)
RBC: 4.87 10*6/uL (ref 4.20–5.82)
RDW: 13.6 % (ref 11.0–14.6)
WBC: 4.4 10*3/uL (ref 4.0–10.3)
lymph#: 1.2 10*3/uL (ref 0.9–3.3)

## 2014-12-15 LAB — LACTATE DEHYDROGENASE (CC13): LDH: 260 U/L — ABNORMAL HIGH (ref 125–245)

## 2014-12-15 MED ORDER — DIPHENHYDRAMINE HCL 25 MG PO CAPS
50.0000 mg | ORAL_CAPSULE | Freq: Once | ORAL | Status: AC
  Administered 2014-12-15: 50 mg via ORAL

## 2014-12-15 MED ORDER — SODIUM CHLORIDE 0.9 % IV SOLN
Freq: Once | INTRAVENOUS | Status: AC
  Administered 2014-12-15: 13:00:00 via INTRAVENOUS

## 2014-12-15 MED ORDER — ACETAMINOPHEN 325 MG PO TABS
650.0000 mg | ORAL_TABLET | Freq: Once | ORAL | Status: AC
  Administered 2014-12-15: 650 mg via ORAL

## 2014-12-15 MED ORDER — DIPHENHYDRAMINE HCL 25 MG PO CAPS
ORAL_CAPSULE | ORAL | Status: AC
  Filled 2014-12-15: qty 2

## 2014-12-15 MED ORDER — ACETAMINOPHEN 325 MG PO TABS
ORAL_TABLET | ORAL | Status: AC
  Filled 2014-12-15: qty 2

## 2014-12-15 MED ORDER — SODIUM CHLORIDE 0.9 % IV SOLN
375.0000 mg/m2 | Freq: Once | INTRAVENOUS | Status: AC
  Administered 2014-12-15: 700 mg via INTRAVENOUS
  Filled 2014-12-15: qty 70

## 2014-12-15 NOTE — Progress Notes (Signed)
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   PRIOR THERAPY:  1. Status post 7 cycles of systemic chemotherapy with CHOP/Rituxan. Last dose was given May 2010. 2. Status post 6 cycles of maintenance Rituxan 375 mg/sq m given every 2 months. Last dose was given on May 29, 2010, and the patient was lost to followup at that time. He received another dose on 12/11/2010, then again missed 2 doses. 3. systemic chemotherapy with Rituxan 375 mg/M2 on day 1 and that bendamustine 90 mg/M2 on days 1 and 2 every 4 weeks, status post 6 cycles. 4. Maintenance Rituxan 375 mg/M2 every 2 months, status post 12 cycles   CURRENT THERAPY: Rituxan 375 MG/M2 weekly for 4 weeks, last dose today, followed by maintenance treatment every 2 months.  INTERVAL HISTORY: Eric [name] 74 y.o. male returns to the clinic today for followup visit. The patient is feeling fine today with no specific complaints. Has been tolerating his treatment with weekly Rituxan fairly well with no significant adverse effects. He denied having any significant chest pain, shortness of breath, cough or hemoptysis. He denied having any weight loss or night sweats. He has no fever or chills, no nausea or vomiting. He has no lymphadenopathy. He is here today for cycle #4 of the weekly Rituxan.  MEDICAL HISTORY: Past Medical History  Diagnosis Date  . History of heart attack   . Cancer ([place])   . Non Hodgkin's lymphoma ([place])   . Coronary atherosclerosis of native coronary artery   . Hyperlipidemia     ALLERGIES:  is allergic to neosporin.  MEDICATIONS:  Current Outpatient Prescriptions  Medication Sig Dispense Refill  . aspirin EC 325 MG EC tablet Take 1 tablet (325 mg total) by mouth daily. 30 tablet   . atorvastatin (LIPITOR) 40  MG tablet TAKE 1 TABLET (40 MG TOTAL) BY MOUTH DAILY AT 6 PM. 90 tablet 0  . diphenhydrAMINE (BENADRYL) 25 MG tablet Take 25 mg by mouth at bedtime as needed. Sleep    . nitroGLYCERIN (NITROSTAT) 0.4 MG SL tablet Place 1 tablet (0.4 mg total) under the tongue every 5 (five) minutes x 3 doses as needed for chest pain. 25 tablet 3  . sildenafil (VIAGRA) 25 MG tablet Take 25 mg by mouth daily as needed. Take 2-3 tablets by mouth once daily as needed.     No current facility-administered medications for this visit.    SURGICAL HISTORY:  Past Surgical History  Procedure Laterality Date  . Coronary stent placement    . Left heart catheterization with coronary angiogram N/A 03/03/2012    Procedure: LEFT HEART CATHETERIZATION WITH CORONARY ANGIOGRAM;  Surgeon: [name], MD;  Location: [facility] CATH LAB;  Service: Cardiovascular;  Laterality: N/A;  . Percutaneous coronary stent intervention (pci-s) N/A 03/04/2012    Procedure: PERCUTANEOUS CORONARY STENT INTERVENTION (PCI-S);  Surgeon: [name], MD;  Location: [facility] CATH LAB;  Service: Cardiovascular;  Laterality: N/A;    REVIEW OF SYSTEMS:  Constitutional: negative Eyes: negative Ears, nose, mouth, throat, and face: negative Respiratory: negative Cardiovascular: negative Gastrointestinal: negative Genitourinary:negative Integument/breast: negative Hematologic/lymphatic: negative Musculoskeletal:negative Neurological: negative Behavioral/Psych: negative Endocrine: negative Allergic/Immunologic: negative   PHYSICAL EXAMINATION: General appearance: alert, cooperative and no distress Head: Normocephalic, without obvious abnormality, atraumatic Neck: no adenopathy Lymph nodes: Cervical, supraclavicular, and  axillary nodes normal. Resp: clear to auscultation bilaterally Back: symmetric, no curvature. ROM normal. No CVA tenderness. Cardio: regular rate and rhythm, S1, S2 normal, no murmur, click, rub or gallop GI: soft, non-tender; bowel  sounds normal; no masses,  no organomegaly Extremities: extremities normal, atraumatic, no cyanosis or edema Neurologic: Alert and oriented X 3, normal strength and tone. Normal symmetric reflexes. Normal coordination and gait  ECOG PERFORMANCE STATUS: 0 - Asymptomatic  Blood pressure 122/82, pulse 66, temperature 97.8 F (36.6 C), temperature source Oral, resp. rate 18, height '5\' 7"'$  (1.702 m), weight 154 lb 14.4 oz (70.262 kg), SpO2 100 %.  LABORATORY DATA: Lab Results  Component Value Date   WBC 4.4 12/15/2014   HGB 15.1 12/15/2014   HCT 44.3 12/15/2014   MCV 91.0 12/15/2014   PLT 184 12/15/2014      Chemistry      Component Value Date/Time   NA 142 12/08/2014 1124   NA 142 03/05/2012 0545   NA 141 03/23/2010 1301   K 4.1 12/08/2014 1124   K 3.3* 03/05/2012 0545   K 4.7 03/23/2010 1301   CL 106 08/06/2012 1244   CL 107 03/05/2012 0545   CL 99 03/23/2010 1301   CO2 26 12/08/2014 1124   CO2 23 03/05/2012 0545   CO2 26 03/23/2010 1301   BUN 11.8 12/08/2014 1124   BUN 9 03/05/2012 0545   BUN 12 03/23/2010 1301   CREATININE 1.0 12/08/2014 1124   CREATININE 0.92 03/05/2012 0545   CREATININE 1.0 03/23/2010 1301      Component Value Date/Time   CALCIUM 9.2 12/08/2014 1124   CALCIUM 8.5 03/05/2012 0545   CALCIUM 9.1 03/23/2010 1301   ALKPHOS 99 12/08/2014 1124   ALKPHOS 86 08/20/2014 1220   ALKPHOS 78 03/23/2010 1301   AST 25 12/08/2014 1124   AST 20 08/20/2014 1220   AST 25 03/23/2010 1301   ALT 20 12/08/2014 1124   ALT 12 08/20/2014 1220   ALT 20 03/23/2010 1301   BILITOT 0.47 12/08/2014 1124   BILITOT 0.6 08/20/2014 1220   BILITOT 0.60 03/23/2010 1301       RADIOGRAPHIC STUDIES: No results found. ASSESSMENT AND PLAN: This is a very pleasant 74 years old male with history of bulky stage IV low-grade lymphoma completed maintenance Rituxan status post 12 cycles. Has been observation but the recent CT scan of the chest, abdomen and pelvis showed evidence for  disease progression. The patient was started on weekly Rituxan status post 3 cycles and he is here today for cycle #4. I recommended for him to proceed with the treatment today as a scheduled. He would come back for follow-up visit in 2 months for reevaluation with maintenance Rituxan every 2 months. He was advised to call immediately if he has any concerning symptoms in the interval. The patient voices understanding of current disease status and treatment options and is in agreement with the current care plan.  All questions were answered. The patient knows to call the clinic with any problems, questions or concerns. We can certainly see the patient much sooner if necessary.  Disclaimer: This note was dictated with voice recognition software. Similar sounding words can inadvertently be transcribed and may not be corrected upon review.

## 2014-12-15 NOTE — Telephone Encounter (Signed)
per pof to sch pt appt-cld & spoke to Loria(daughter) and gave appt time & date

## 2014-12-15 NOTE — Patient Instructions (Signed)
[facility] Center Discharge Instructions for Patients Receiving Chemotherapy  Today you received the following chemotherapy agents rituxan.   To help prevent nausea and vomiting after your treatment, we encourage you to take your nausea medication as directed.     If you develop nausea and vomiting that is not controlled by your nausea medication, call the clinic.   BELOW ARE SYMPTOMS THAT SHOULD BE REPORTED IMMEDIATELY:  *FEVER GREATER THAN 100.5 F  *CHILLS WITH OR WITHOUT FEVER  NAUSEA AND VOMITING THAT IS NOT CONTROLLED WITH YOUR NAUSEA MEDICATION  *UNUSUAL SHORTNESS OF BREATH  *UNUSUAL BRUISING OR BLEEDING  TENDERNESS IN MOUTH AND THROAT WITH OR WITHOUT PRESENCE OF ULCERS  *URINARY PROBLEMS  *BOWEL PROBLEMS  UNUSUAL RASH Items with * indicate a potential emergency and should be followed up as soon as possible.  Feel free to call the clinic you have any questions or concerns. The clinic phone number is [phone].  

## 2014-12-28 ENCOUNTER — Encounter: Payer: Self-pay | Admitting: Physician Assistant

## 2015-01-05 ENCOUNTER — Ambulatory Visit (INDEPENDENT_AMBULATORY_CARE_PROVIDER_SITE_OTHER): Payer: Medicare Other | Admitting: Urgent Care

## 2015-01-05 ENCOUNTER — Encounter: Payer: Self-pay | Admitting: Urgent Care

## 2015-01-05 VITALS — BP 134/80 | HR 71 | Temp 98.4°F | Resp 16 | Ht 67.0 in | Wt 152.8 lb

## 2015-01-05 DIAGNOSIS — Z23 Encounter for immunization: Secondary | ICD-10-CM

## 2015-01-05 DIAGNOSIS — G4700 Insomnia, unspecified: Secondary | ICD-10-CM | POA: Diagnosis not present

## 2015-01-05 DIAGNOSIS — C8590 Non-Hodgkin lymphoma, unspecified, unspecified site: Secondary | ICD-10-CM | POA: Diagnosis not present

## 2015-01-05 DIAGNOSIS — Z0000 Encounter for general adult medical examination without abnormal findings: Secondary | ICD-10-CM

## 2015-01-05 LAB — CBC
HEMATOCRIT: 44.2 % (ref 39.0–52.0)
HEMOGLOBIN: 14.9 g/dL (ref 13.0–17.0)
MCH: 29.9 pg (ref 26.0–34.0)
MCHC: 33.7 g/dL (ref 30.0–36.0)
MCV: 88.8 fL (ref 78.0–100.0)
MPV: 10.5 fL (ref 8.6–12.4)
Platelets: 236 10*3/uL (ref 150–400)
RBC: 4.98 MIL/uL (ref 4.22–5.81)
RDW: 13.5 % (ref 11.5–15.5)
WBC: 4.2 10*3/uL (ref 4.0–10.5)

## 2015-01-05 LAB — LIPID PANEL
CHOL/HDL RATIO: 3 ratio (ref <=5.0)
Cholesterol: 160 mg/dL (ref 125–200)
HDL: 53 mg/dL (ref >=40)
LDL CALC: 92 mg/dL (ref <130)
Triglycerides: 76 mg/dL (ref <150)
VLDL: 15 mg/dL (ref <30)

## 2015-01-05 LAB — COMPREHENSIVE METABOLIC PANEL
ALK PHOS: 101 U/L (ref 40–115)
ALT: 17 U/L (ref 9–46)
AST: 24 U/L (ref 10–35)
Albumin: 4.2 g/dL (ref 3.6–5.1)
BUN: 15 mg/dL (ref 7–25)
CALCIUM: 9.5 mg/dL (ref 8.6–10.3)
CO2: 25 mmol/L (ref 20–31)
Chloride: 102 mmol/L (ref 98–110)
Creat: 1.07 mg/dL (ref 0.70–1.18)
GLUCOSE: 75 mg/dL (ref 65–99)
POTASSIUM: 4.5 mmol/L (ref 3.5–5.3)
Sodium: 139 mmol/L (ref 135–146)
Total Bilirubin: 0.7 mg/dL (ref 0.2–1.2)
Total Protein: 6.4 g/dL (ref 6.1–8.1)

## 2015-01-05 LAB — TSH: TSH: 0.708 u[IU]/mL (ref 0.350–4.500)

## 2015-01-05 MED ORDER — LORAZEPAM 0.5 MG PO TABS: 0.5000 mg | ORAL_TABLET | Freq: Two times a day (BID) | ORAL | Status: DC | PRN

## 2015-01-05 MED ORDER — NITROGLYCERIN 0.4 MG SL SUBL: 0.4000 mg | SUBLINGUAL_TABLET | SUBLINGUAL | Status: DC | PRN

## 2015-01-05 MED ORDER — SILDENAFIL CITRATE 25 MG PO TABS: 25.0000 mg | ORAL_TABLET | Freq: Every day | ORAL | Status: DC | PRN

## 2015-01-05 NOTE — Progress Notes (Signed)
   Subjective:    Patient ID: Eric [name], male    DOB: 1940/06/02, 74 y.o.   MRN: [id number]  HPI    Review of Systems  Constitutional: Negative.   HENT: Positive for hearing loss and tinnitus.   Eyes: Positive for visual disturbance.  Respiratory: Negative.   Cardiovascular: Negative.   Gastrointestinal:       Jerrye Bushy  Endocrine: Negative.   Genitourinary: Negative.   Musculoskeletal: Negative.   Skin: Negative.   Allergic/Immunologic: Negative.   Neurological: Negative.   Hematological: Negative.   Psychiatric/Behavioral: Positive for sleep disturbance.       Objective:   Physical Exam        Assessment & Plan:

## 2015-01-05 NOTE — Patient Instructions (Addendum)
Keeping you healthy  Get these tests  Blood pressure- Have your blood pressure checked once a year by your healthcare provider.  Normal blood pressure is 120/80  Weight- Have your body mass index (BMI) calculated to screen for obesity.  BMI is a measure of body fat based on height and weight. You can also calculate your own BMI at ViewBanking.si.  Cholesterol- Have your cholesterol checked every year.  Diabetes- Have your blood sugar checked regularly if you have high blood pressure, high cholesterol, have a family history of diabetes or if you are overweight.  Screening for Colon Cancer- Colonoscopy starting at age 65.  Screening may begin sooner depending on your family history and other health conditions. Follow up colonoscopy as directed by your Gastroenterologist.  Screening for Prostate Cancer- Both blood work (PSA) and a rectal exam help screen for Prostate Cancer.  Screening begins at age 67 with African-American men and at age 41 with Caucasian men.  Screening may begin sooner depending on your family history.  Take these medicines  Aspirin- One aspirin daily can help prevent Heart disease and Stroke.  Flu shot- Every fall.  Tetanus- Every 10 years.  Zostavax- Once after the age of 80 to prevent Shingles.  Pneumonia shot- Once after the age of 54; if you are younger than 16, ask your healthcare provider if you need a Pneumonia shot.  Take these steps  Don't smoke- If you do smoke, talk to your doctor about quitting.  For tips on how to quit, go to www.smokefree.gov or call 1-800-QUIT-NOW.  Be physically active- Exercise 5 days a week for at least 30 minutes.  If you are not already physically active start slow and gradually work up to 30 minutes of moderate physical activity.  Examples of moderate activity include walking briskly, mowing the yard, dancing, swimming, bicycling, etc.  Eat a healthy diet- Eat a variety of healthy food such as fruits, vegetables, low  fat milk, low fat cheese, yogurt, lean meant, poultry, fish, beans, tofu, etc. For more information go to www.thenutritionsource.org  Drink alcohol in moderation- Limit alcohol intake to less than two drinks a day. Never drink and drive.  Dentist- Brush and floss twice daily; visit your dentist twice a year.  Depression- Your emotional health is as important as your physical health. If you're feeling down, or losing interest in things you would normally enjoy please talk to your healthcare provider.  Eye exam- Visit your eye doctor every year.  Safe sex- If you may be exposed to a sexually transmitted infection, use a condom.  Seat belts- Seat belts can save your life; always wear one.  Smoke/Carbon Monoxide detectors- These detectors need to be installed on the appropriate level of your home.  Replace batteries at least once a year.  Skin cancer- When out in the sun, cover up and use sunscreen 15 SPF or higher.  Violence- If anyone is threatening you, please tell your healthcare provider.  Living Will/ Health care power of attorney- Speak with your healthcare provider and family.   Lorazepam tablets What is this medicine? LORAZEPAM (lor A ze pam) is a benzodiazepine. It is used to treat anxiety. This medicine may be used for other purposes; ask your health care provider or pharmacist if you have questions. What should I tell my health care provider before I take this medicine? They need to know if you have any of these conditions: -alcohol or drug abuse problem -bipolar disorder, depression, psychosis or other mental health condition -  glaucoma -kidney or liver disease -lung disease or breathing difficulties -myasthenia gravis -Parkinson's disease -seizures or a history of seizures -suicidal thoughts -an unusual or allergic reaction to lorazepam, other benzodiazepines, foods, dyes, or preservatives -pregnant or trying to get pregnant -breast-feeding How should I use this  medicine? Take this medicine by mouth with a glass of water. Follow the directions on the prescription label. If it upsets your stomach, take it with food or milk. Take your medicine at regular intervals. Do not take it more often than directed. Do not stop taking except on the advice of your doctor or health care professional. Talk to your pediatrician regarding the use of this medicine in children. Special care may be needed. Overdosage: If you think you have taken too much of this medicine contact a poison control center or emergency room at once. NOTE: This medicine is only for you. Do not share this medicine with others. What if I miss a dose? If you miss a dose, take it as soon as you can. If it is almost time for your next dose, take only that dose. Do not take double or extra doses. What may interact with this medicine? -barbiturate medicines for inducing sleep or treating seizures, like phenobarbital -clozapine -medicines for depression, mental problems or psychiatric disturbances -medicines for sleep -phenytoin -probenecid -theophylline -valproic acid This list may not describe all possible interactions. Give your health care provider a list of all the medicines, herbs, non-prescription drugs, or dietary supplements you use. Also tell them if you smoke, drink alcohol, or use illegal drugs. Some items may interact with your medicine. What should I watch for while using this medicine? Visit your doctor or health care professional for regular checks on your progress. Your body may become dependent on this medicine, ask your doctor or health care professional if you still need to take it. However, if you have been taking this medicine regularly for some time, do not suddenly stop taking it. You must gradually reduce the dose or you may get severe side effects. Ask your doctor or health care professional for advice before increasing or decreasing the dose. Even after you stop taking this  medicine it can still affect your body for several days. You may get drowsy or dizzy. Do not drive, use machinery, or do anything that needs mental alertness until you know how this medicine affects you. To reduce the risk of dizzy and fainting spells, do not stand or sit up quickly, especially if you are an older patient. Alcohol may increase dizziness and drowsiness. Avoid alcoholic drinks. Do not treat yourself for coughs, colds or allergies without asking your doctor or health care professional for advice. Some ingredients can increase possible side effects. What side effects may I notice from receiving this medicine? Side effects that you should report to your doctor or health care professional as soon as possible: -changes in vision -confusion -depression -mood changes, excitability or aggressive behavior -movement difficulty, staggering or jerky movements -muscle cramps -restlessness -weakness or tiredness Side effects that usually do not require medical attention (report to your doctor or health care professional if they continue or are bothersome): -constipation or diarrhea -difficulty sleeping, nightmares -dizziness, drowsiness -headache -nausea, vomiting This list may not describe all possible side effects. Call your doctor for medical advice about side effects. You may report side effects to FDA at 1-800-FDA-1088. Where should I keep my medicine? Keep out of the reach of children. This medicine can be abused. Keep your medicine in  a safe place to protect it from theft. Do not share this medicine with anyone. Selling or giving away this medicine is dangerous and against the law. This medicine may cause accidental overdose and death if taken by other adults, children, or pets. Mix any unused medicine with a substance like cat litter or coffee grounds. Then throw the medicine away in a sealed container like a sealed bag or a coffee can with a lid. Do not use the medicine after the  expiration date. Store at room temperature between 20 and 25 degrees C (68 and 77 degrees F). Protect from light. Keep container tightly closed. NOTE: This sheet is a summary. It may not cover all possible information. If you have questions about this medicine, talk to your doctor, pharmacist, or health care provider.    2016, Elsevier/Gold Standard. (2013-11-10 15:24:21)

## 2015-01-05 NOTE — Progress Notes (Signed)
MRN: [id number]  Subjective:   Mr. Eric [name] is a 74 y.o. male presenting for annual physical exam and insomnia.  Medical care team includes: PCP: Kennon Portela, MD Specialists: Dr. Evette Cristal has a problem list that includes lymphoma, see below.  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 Admits healthy diet. He is presenting here today for his annual physical.  NHLymphoma - he is currently being managed by Dr. Earlie [name]. He is undergoing chemotherapy since 08/2014, now switched to chemo every other month until reimaging in 03/2015. He currently denies fevers, night sweats, weight loss. Patient states that he has had a difficult time sleeping. He's had this for several years since initially being diagnosed with lymphoma in 2009. Used to take Ambien a while ago, is not interested in this now. Patient states that his primary problem is worrying about things in general at night. He has tried Benadryl 50 mg, this only helps him get about 4 hours of sleep.  MI - history of MI in 2013, has not taken Nitro for several once. But would like to have this refilled just in case. Patient is aware that he is not to take nitroglycerin while also taking sildenafil.  Eric [name] has a current medication list which includes the following prescription(s): aspirin, atorvastatin, diphenhydramine, nitroglycerin, and sildenafil. He is allergic to neosporin.  Eric [name]  has a past medical history of History of heart attack; Cancer ([place]); Non Hodgkin's lymphoma ([place]); Coronary atherosclerosis of native coronary artery; and Hyperlipidemia. Also  has past surgical history that includes Coronary stent placement; left heart catheterization with coronary angiogram (N/A, 03/03/2012); and percutaneous coronary stent intervention (pci-s) (N/A, 03/04/2012).  family history includes Heart disease in his father; Leukemia in his mother.  Immunizations: Flu shot updated today.  ROS   Objective:    Vitals: BP 134/80 mmHg  Pulse 71  Temp(Src) 98.4 F (36.9 C) (Oral)  Resp 16  Ht '5\' 7"'$  (1.702 m)  Wt 152 lb 12.8 oz (69.31 kg)  BMI 23.93 kg/m2  SpO2 96%  Physical Exam  Constitutional: He is oriented to person, place, and time. He appears well-developed and well-nourished.  HENT:  TM's intact bilaterally, no effusions or erythema. Nares patent, nasal turbinates pink and moist, nasal passages patent. No sinus tenderness. Oropharynx clear, mucous membranes moist, dentition in good repair.  Eyes: Conjunctivae and EOM are normal. Pupils are equal, round, and reactive to light. Right eye exhibits no discharge. Left eye exhibits no discharge. No scleral icterus.  Neck: Normal range of motion. Neck supple. No thyromegaly present.  Cardiovascular: Normal rate, regular rhythm and intact distal pulses.  Exam reveals no gallop and no friction rub.   No murmur heard. Pulmonary/Chest: No stridor. No respiratory distress. He has no wheezes. He has no rales.  Abdominal: Soft. Bowel sounds are normal. He exhibits no distension and no mass. There is no tenderness.  Musculoskeletal: Normal range of motion. He exhibits no edema or tenderness.  No lymphadenopathy.  Lymphadenopathy:    He has no cervical adenopathy.  Neurological: He is alert and oriented to person, place, and time.  Skin: Skin is warm and dry. No rash noted. No erythema. No pallor.  Psychiatric: He has a normal mood and affect.   Assessment and Plan :   1. Annual physical exam - Labs pending, patient is stable. - Refill provided for Nitrostat and Viagra. Patient requested 90 pills for a generic form which is apparently what he obtains. I counseled patient on risks  of using Viagra and being unable to use Nitrostat. Patient verbalized understanding. - Discussed healthy lifestyle, diet, exercise, preventative care, vaccinations, and addressed patient's concerns.   2. Non-Hodgkin's lymphoma, unspecified body region, unspecified  non-Hodgkin lymphoma type ([place]) - Continue management with Dr. Earlie [name].  3. Insomnia - Start trial of Ativan for insomnia. Patient will follow up with me by phone to let me know how he is doing with this.  4. Need for prophylactic vaccination and inoculation against influenza - Flu Vaccine QUAD 36+ mos IM   Jaynee Eagles, PA-C [facility] [phone] 01/05/2015  1:27 PM

## 2015-01-19 ENCOUNTER — Other Ambulatory Visit: Payer: Self-pay | Admitting: Urology

## 2015-01-19 DIAGNOSIS — R972 Elevated prostate specific antigen [PSA]: Secondary | ICD-10-CM

## 2015-02-03 ENCOUNTER — Ambulatory Visit (HOSPITAL_COMMUNITY): Payer: Medicare Other

## 2015-02-14 ENCOUNTER — Other Ambulatory Visit: Payer: Self-pay | Admitting: Urology

## 2015-02-14 ENCOUNTER — Ambulatory Visit (HOSPITAL_COMMUNITY)
Admission: RE | Admit: 2015-02-14 | Discharge: 2015-02-14 | Disposition: A | Discharge status: Home / Self Care | Payer: Medicare Other | Source: Ambulatory Visit | Attending: Urology | Admitting: Urology

## 2015-02-14 DIAGNOSIS — R972 Elevated prostate specific antigen [PSA]: Secondary | ICD-10-CM | POA: Insufficient documentation

## 2015-02-14 DIAGNOSIS — N400 Enlarged prostate without lower urinary tract symptoms: Secondary | ICD-10-CM | POA: Diagnosis not present

## 2015-02-14 MED ORDER — GADOBENATE DIMEGLUMINE 529 MG/ML IV SOLN
15.0000 mL | Freq: Once | INTRAVENOUS | Status: AC | PRN
  Administered 2015-02-14: 14 mL via INTRAVENOUS

## 2015-02-16 ENCOUNTER — Encounter: Payer: Self-pay | Admitting: Internal Medicine

## 2015-02-16 ENCOUNTER — Ambulatory Visit (HOSPITAL_BASED_OUTPATIENT_CLINIC_OR_DEPARTMENT_OTHER): Payer: Medicare Other | Admitting: Internal Medicine

## 2015-02-16 ENCOUNTER — Telehealth: Payer: Self-pay | Admitting: Internal Medicine

## 2015-02-16 ENCOUNTER — Ambulatory Visit (HOSPITAL_BASED_OUTPATIENT_CLINIC_OR_DEPARTMENT_OTHER): Payer: Medicare Other

## 2015-02-16 ENCOUNTER — Telehealth: Payer: Self-pay | Admitting: *Deleted

## 2015-02-16 ENCOUNTER — Other Ambulatory Visit (HOSPITAL_BASED_OUTPATIENT_CLINIC_OR_DEPARTMENT_OTHER): Payer: Medicare Other

## 2015-02-16 VITALS — BP 125/71 | HR 67 | Temp 98.3°F | Resp 18 | Ht 67.0 in | Wt 154.9 lb

## 2015-02-16 VITALS — BP 97/58 | HR 65 | Temp 97.7°F | Resp 18

## 2015-02-16 DIAGNOSIS — C8208 Follicular lymphoma grade I, lymph nodes of multiple sites: Secondary | ICD-10-CM

## 2015-02-16 DIAGNOSIS — C8590 Non-Hodgkin lymphoma, unspecified, unspecified site: Secondary | ICD-10-CM | POA: Diagnosis not present

## 2015-02-16 DIAGNOSIS — Z5112 Encounter for antineoplastic immunotherapy: Secondary | ICD-10-CM | POA: Diagnosis not present

## 2015-02-16 LAB — CBC WITH DIFFERENTIAL/PLATELET
BASO%: 1.4 % (ref 0.0–2.0)
BASOS ABS: 0.1 10*3/uL (ref 0.0–0.1)
EOS%: 4.9 % (ref 0.0–7.0)
Eosinophils Absolute: 0.2 10*3/uL (ref 0.0–0.5)
HEMATOCRIT: 43.2 % (ref 38.4–49.9)
HGB: 14.3 g/dL (ref 13.0–17.1)
LYMPH%: 30.9 % (ref 14.0–49.0)
MCH: 30 pg (ref 27.2–33.4)
MCHC: 33.1 g/dL (ref 32.0–36.0)
MCV: 90.8 fL (ref 79.3–98.0)
MONO#: 0.7 10*3/uL (ref 0.1–0.9)
MONO%: 17.6 % — AB (ref 0.0–14.0)
NEUT#: 1.7 10*3/uL (ref 1.5–6.5)
NEUT%: 45.2 % (ref 39.0–75.0)
Platelets: 195 10*3/uL (ref 140–400)
RBC: 4.76 10*6/uL (ref 4.20–5.82)
RDW: 13.7 % (ref 11.0–14.6)
WBC: 3.8 10*3/uL — ABNORMAL LOW (ref 4.0–10.3)
lymph#: 1.2 10*3/uL (ref 0.9–3.3)

## 2015-02-16 LAB — COMPREHENSIVE METABOLIC PANEL
ALK PHOS: 104 U/L (ref 40–150)
ALT: 24 U/L (ref 0–55)
AST: 28 U/L (ref 5–34)
Albumin: 3.9 g/dL (ref 3.5–5.0)
Anion Gap: 8 mEq/L (ref 3–11)
BILIRUBIN TOTAL: 0.58 mg/dL (ref 0.20–1.20)
BUN: 15.9 mg/dL (ref 7.0–26.0)
CALCIUM: 9.1 mg/dL (ref 8.4–10.4)
CHLORIDE: 107 meq/L (ref 98–109)
CO2: 24 meq/L (ref 22–29)
Creatinine: 1.2 mg/dL (ref 0.7–1.3)
EGFR: 60 mL/min/{1.73_m2} — ABNORMAL LOW (ref >90)
Glucose: 81 mg/dl (ref 70–140)
POTASSIUM: 4 meq/L (ref 3.5–5.1)
SODIUM: 139 meq/L (ref 136–145)
Total Protein: 6.3 g/dL — ABNORMAL LOW (ref 6.4–8.3)

## 2015-02-16 LAB — LACTATE DEHYDROGENASE: LDH: 213 U/L (ref 125–245)

## 2015-02-16 MED ORDER — ACETAMINOPHEN 325 MG PO TABS
650.0000 mg | ORAL_TABLET | Freq: Once | ORAL | Status: AC
  Administered 2015-02-16: 650 mg via ORAL

## 2015-02-16 MED ORDER — SODIUM CHLORIDE 0.9 % IV SOLN
Freq: Once | INTRAVENOUS | Status: AC
  Administered 2015-02-16: 11:00:00 via INTRAVENOUS

## 2015-02-16 MED ORDER — DIPHENHYDRAMINE HCL 25 MG PO CAPS
ORAL_CAPSULE | ORAL | Status: AC
  Filled 2015-02-16: qty 2

## 2015-02-16 MED ORDER — ACETAMINOPHEN 325 MG PO TABS
ORAL_TABLET | ORAL | Status: AC
  Filled 2015-02-16: qty 2

## 2015-02-16 MED ORDER — DIPHENHYDRAMINE HCL 25 MG PO CAPS
50.0000 mg | ORAL_CAPSULE | Freq: Once | ORAL | Status: AC
  Administered 2015-02-16: 50 mg via ORAL

## 2015-02-16 MED ORDER — SODIUM CHLORIDE 0.9 % IV SOLN
375.0000 mg/m2 | Freq: Once | INTRAVENOUS | Status: AC
  Administered 2015-02-16: 700 mg via INTRAVENOUS
  Filled 2015-02-16: qty 70

## 2015-02-16 NOTE — Telephone Encounter (Signed)
per pof to sch pt appt-gave pt copy of avs-MW sch trmt

## 2015-02-16 NOTE — Patient Instructions (Signed)
[facility] Discharge Instructions for Patients Receiving Chemotherapy  Today you received the following chemotherapy agents Rituxan To help prevent nausea and vomiting after your treatment, we encourage you to take your nausea medication as prescribed.  If you develop nausea and vomiting that is not controlled by your nausea medication, call the clinic.   BELOW ARE SYMPTOMS THAT SHOULD BE REPORTED IMMEDIATELY:  *FEVER GREATER THAN 100.5 F  *CHILLS WITH OR WITHOUT FEVER  NAUSEA AND VOMITING THAT IS NOT CONTROLLED WITH YOUR NAUSEA MEDICATION  *UNUSUAL SHORTNESS OF BREATH  *UNUSUAL BRUISING OR BLEEDING  TENDERNESS IN MOUTH AND THROAT WITH OR WITHOUT PRESENCE OF ULCERS  *URINARY PROBLEMS  *BOWEL PROBLEMS  UNUSUAL RASH Items with * indicate a potential emergency and should be followed up as soon as possible.  Feel free to call the clinic you have any questions or concerns. The clinic phone number is [phone].  Please show the CHEMO ALERT CARD at check-in to the Emergency Department and triage nurse.   

## 2015-02-16 NOTE — Progress Notes (Signed)
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   PRIOR THERAPY:  1. Status post 7 cycles of systemic chemotherapy with CHOP/Rituxan. Last dose was given May 2010. 2. Status post 6 cycles of maintenance Rituxan 375 mg/sq m given every 2 months. Last dose was given on May 29, 2010, and the patient was lost to followup at that time. He received another dose on 12/11/2010, then again missed 2 doses. 3. systemic chemotherapy with Rituxan 375 mg/M2 on day 1 and that bendamustine 90 mg/M2 on days 1 and 2 every 4 weeks, status post 6 cycles. 4. Maintenance Rituxan 375 mg/M2 every 2 months, status post 12 cycles   CURRENT THERAPY: Rituxan 375 MG/M2 weekly for 4 weeks, last dose today, followed by maintenance treatment every 2 months.  INTERVAL HISTORY: Eric [name] 74 y.o. male returns to the clinic today for followup visit. The patient is feeling fine today with no specific complaints. He has been tolerating his treatment with weekly Rituxan fairly well with no significant adverse effects. He denied having any significant chest pain, shortness of breath, cough or hemoptysis. He denied having any weight loss or night sweats. He has no fever or chills, no nausea or vomiting. He has no lymphadenopathy. He is here today for cycle #5 of the weekly Rituxan.  MEDICAL HISTORY: Past Medical History  Diagnosis Date  . History of heart attack   . Cancer ([place])   . Non Hodgkin's lymphoma ([place])   . Coronary atherosclerosis of native coronary artery   . Hyperlipidemia   . Myocardial infarction ([place])   . GERD (gastroesophageal reflux disease)     ALLERGIES:  is allergic to neosporin.  MEDICATIONS:  Current Outpatient Prescriptions  Medication Sig Dispense Refill  . aspirin EC 325 MG EC tablet Take 1  tablet (325 mg total) by mouth daily. 30 tablet   . atorvastatin (LIPITOR) 40 MG tablet TAKE 1 TABLET (40 MG TOTAL) BY MOUTH DAILY AT 6 PM. 90 tablet 0  . diphenhydrAMINE (BENADRYL) 25 MG tablet Take 25 mg by mouth at bedtime as needed. Sleep    . LORazepam (ATIVAN) 0.5 MG tablet Take 1 tablet (0.5 mg total) by mouth 2 (two) times daily as needed for anxiety. 30 tablet 1  . nitroGLYCERIN (NITROSTAT) 0.4 MG SL tablet Place 1 tablet (0.4 mg total) under the tongue every 5 (five) minutes x 3 doses as needed for chest pain. 25 tablet 3  . sildenafil (VIAGRA) 25 MG tablet Take 1 tablet (25 mg total) by mouth daily as needed. Take 2-3 tablets by mouth once daily as needed. 90 tablet 6   No current facility-administered medications for this visit.    SURGICAL HISTORY:  Past Surgical History  Procedure Laterality Date  . Coronary stent placement    . Left heart catheterization with coronary angiogram N/A 03/03/2012    Procedure: LEFT HEART CATHETERIZATION WITH CORONARY ANGIOGRAM;  Surgeon: [name], MD;  Location: [facility] CATH LAB;  Service: Cardiovascular;  Laterality: N/A;  . Percutaneous coronary stent intervention (pci-s) N/A 03/04/2012    Procedure: PERCUTANEOUS CORONARY STENT INTERVENTION (PCI-S);  Surgeon: [name], MD;  Location: [facility] CATH LAB;  Service: Cardiovascular;  Laterality: N/A;    REVIEW OF SYSTEMS:  A comprehensive review of systems was negative.   PHYSICAL EXAMINATION: General  appearance: alert, cooperative and no distress Head: Normocephalic, without obvious abnormality, atraumatic Neck: no adenopathy Lymph nodes: Cervical, supraclavicular, and axillary nodes normal. Resp: clear to auscultation bilaterally Back: symmetric, no curvature. ROM normal. No CVA tenderness. Cardio: regular rate and rhythm, S1, S2 normal, no murmur, click, rub or gallop GI: soft, non-tender; bowel sounds normal; no masses,  no organomegaly Extremities: extremities normal, atraumatic, no  cyanosis or edema Neurologic: Alert and oriented X 3, normal strength and tone. Normal symmetric reflexes. Normal coordination and gait  ECOG PERFORMANCE STATUS: 0 - Asymptomatic  Blood pressure 125/71, pulse 67, temperature 98.3 F (36.8 C), temperature source Oral, resp. rate 18, height '5\' 7"'$  (1.702 m), weight 154 lb 14.4 oz (70.262 kg), SpO2 100 %.  LABORATORY DATA: Lab Results  Component Value Date   WBC 3.8* 02/16/2015   HGB 14.3 02/16/2015   HCT 43.2 02/16/2015   MCV 90.8 02/16/2015   PLT 195 02/16/2015      Chemistry      Component Value Date/Time   NA 139 01/05/2015 1440   NA 142 12/15/2014 1107   NA 141 03/23/2010 1301   K 4.5 01/05/2015 1440   K 4.2 12/15/2014 1107   K 4.7 03/23/2010 1301   CL 102 01/05/2015 1440   CL 106 08/06/2012 1244   CL 99 03/23/2010 1301   CO2 25 01/05/2015 1440   CO2 24 12/15/2014 1107   CO2 26 03/23/2010 1301   BUN 15 01/05/2015 1440   BUN 14.8 12/15/2014 1107   BUN 12 03/23/2010 1301   CREATININE 1.07 01/05/2015 1440   CREATININE 1.1 12/15/2014 1107   CREATININE 0.92 03/05/2012 0545      Component Value Date/Time   CALCIUM 9.5 01/05/2015 1440   CALCIUM 9.4 12/15/2014 1107   CALCIUM 9.1 03/23/2010 1301   ALKPHOS 101 01/05/2015 1440   ALKPHOS 111 12/15/2014 1107   ALKPHOS 78 03/23/2010 1301   AST 24 01/05/2015 1440   AST 24 12/15/2014 1107   AST 25 03/23/2010 1301   ALT 17 01/05/2015 1440   ALT 18 12/15/2014 1107   ALT 20 03/23/2010 1301   BILITOT 0.7 01/05/2015 1440   BILITOT 0.53 12/15/2014 1107   BILITOT 0.60 03/23/2010 1301       RADIOGRAPHIC STUDIES: Mr Prostate Wo Cm  02/14/2015  CLINICAL DATA:  Elevated prostate specific antigen. Prior biopsy. Personal history of lymphoma. EXAM: MR PROSTATE WITHOUT CONTRAST TECHNIQUE: Multiplanar multisequence MRI images were obtained of the pelvis centered about the prostate. Pre and post contrast images were obtained. COMPARISON:  Thin with FINDINGS: Prostate: The peripheral  zone is thin measuring 4 to 5 mm in greatest thickness. There is no diffusion abnormality within the peripheral zone. There is normal high T2 signal within the peripheral zone on T2 weighted imaging. The transitional zone is markedly enlarged with multiple well capsulated nodules with typical heterogeneous signal intensity on T2 weighted imaging. There is an large extruded nodule which extends into the base the bladder seen on sagittal projection series 8 Transcapsular spread:  Absent Seminal vesicle involvement: Absent Neurovascular bundle involvement: Absent Pelvic adenopathy: Absent Bone metastasis: Absent Other findings: Prostate gland is enlarged measuring 80 mm by 63 mm in axial dimension and 88 mm in craniocaudad dimension. IMPRESSION: 1. No evidence high-grade prostate carcinoma. 2. Thin peripheral zone. 3. Enlarged enlarged transitional zone consistent with benign prostate hypertrophy. Electronically Signed   By: Suzy Bouchard M.D.   On: 02/14/2015 14:38   ASSESSMENT AND PLAN: This is a very  pleasant 74 years old male with history of bulky stage IV low-grade lymphoma completed maintenance Rituxan status post 12 cycles. Has been observation but the recent CT scan of the chest, abdomen and pelvis showed evidence for disease progression. The patient was started on weekly Rituxan status post 4 cycles, last dose was given 2 months ago. The patient is here today to start cycle #5 of his treatment with Rituxan.   I recommended for him to proceed with the treatment today as a scheduled. He would come back for follow-up visit in 2 months for reevaluation with maintenance Rituxan every 2 months. He was advised to call immediately if he has any concerning symptoms in the interval. The patient voices understanding of current disease status and treatment options and is in agreement with the current care plan.  All questions were answered. The patient knows to call the clinic with any problems, questions or  concerns. We can certainly see the patient much sooner if necessary.  Disclaimer: This note was dictated with voice recognition software. Similar sounding words can inadvertently be transcribed and may not be corrected upon review.

## 2015-02-16 NOTE — Telephone Encounter (Signed)
Per staff message and POF I have scheduled appts. Advised scheduler of appts. JMW  

## 2015-03-02 ENCOUNTER — Other Ambulatory Visit: Payer: Self-pay | Admitting: Nurse Practitioner

## 2015-04-19 ENCOUNTER — Telehealth: Payer: Self-pay | Admitting: *Deleted

## 2015-04-19 ENCOUNTER — Other Ambulatory Visit (HOSPITAL_BASED_OUTPATIENT_CLINIC_OR_DEPARTMENT_OTHER): Payer: Medicare Other

## 2015-04-19 ENCOUNTER — Ambulatory Visit (HOSPITAL_BASED_OUTPATIENT_CLINIC_OR_DEPARTMENT_OTHER): Payer: Medicare Other

## 2015-04-19 ENCOUNTER — Telehealth: Payer: Self-pay | Admitting: Internal Medicine

## 2015-04-19 ENCOUNTER — Encounter: Payer: Self-pay | Admitting: Internal Medicine

## 2015-04-19 ENCOUNTER — Ambulatory Visit (HOSPITAL_BASED_OUTPATIENT_CLINIC_OR_DEPARTMENT_OTHER): Payer: Medicare Other | Admitting: Internal Medicine

## 2015-04-19 VITALS — BP 117/80 | HR 78 | Temp 98.0°F | Resp 18 | Ht 67.0 in | Wt 151.6 lb

## 2015-04-19 DIAGNOSIS — C8208 Follicular lymphoma grade I, lymph nodes of multiple sites: Secondary | ICD-10-CM

## 2015-04-19 DIAGNOSIS — Z5112 Encounter for antineoplastic immunotherapy: Secondary | ICD-10-CM | POA: Diagnosis not present

## 2015-04-19 LAB — CBC WITH DIFFERENTIAL/PLATELET
BASO%: 1.5 % (ref 0.0–2.0)
BASOS ABS: 0.1 10*3/uL (ref 0.0–0.1)
EOS%: 8.1 % — AB (ref 0.0–7.0)
Eosinophils Absolute: 0.4 10*3/uL (ref 0.0–0.5)
HCT: 48.3 % (ref 38.4–49.9)
HGB: 16.5 g/dL (ref 13.0–17.1)
LYMPH%: 33 % (ref 14.0–49.0)
MCH: 30.8 pg (ref 27.2–33.4)
MCHC: 34.2 g/dL (ref 32.0–36.0)
MCV: 90.3 fL (ref 79.3–98.0)
MONO#: 0.6 10*3/uL (ref 0.1–0.9)
MONO%: 12 % (ref 0.0–14.0)
NEUT#: 2.1 10*3/uL (ref 1.5–6.5)
NEUT%: 45.4 % (ref 39.0–75.0)
Platelets: 219 10*3/uL (ref 140–400)
RBC: 5.35 10*6/uL (ref 4.20–5.82)
RDW: 13.2 % (ref 11.0–14.6)
WBC: 4.6 10*3/uL (ref 4.0–10.3)
lymph#: 1.5 10*3/uL (ref 0.9–3.3)

## 2015-04-19 LAB — LACTATE DEHYDROGENASE: LDH: 277 U/L — ABNORMAL HIGH (ref 125–245)

## 2015-04-19 LAB — COMPREHENSIVE METABOLIC PANEL
ALT: 33 U/L (ref 0–55)
AST: 30 U/L (ref 5–34)
Albumin: 4.4 g/dL (ref 3.5–5.0)
Alkaline Phosphatase: 125 U/L (ref 40–150)
Anion Gap: 12 mEq/L — ABNORMAL HIGH (ref 3–11)
BUN: 21.4 mg/dL (ref 7.0–26.0)
CHLORIDE: 103 meq/L (ref 98–109)
CO2: 23 mEq/L (ref 22–29)
Calcium: 9.7 mg/dL (ref 8.4–10.4)
Creatinine: 1.3 mg/dL (ref 0.7–1.3)
EGFR: 53 mL/min/{1.73_m2} — ABNORMAL LOW (ref >90)
GLUCOSE: 79 mg/dL (ref 70–140)
POTASSIUM: 4.5 meq/L (ref 3.5–5.1)
SODIUM: 139 meq/L (ref 136–145)
Total Bilirubin: 0.56 mg/dL (ref 0.20–1.20)
Total Protein: 7.6 g/dL (ref 6.4–8.3)

## 2015-04-19 MED ORDER — SODIUM CHLORIDE 0.9 % IV SOLN
Freq: Once | INTRAVENOUS | Status: AC
  Administered 2015-04-19: 11:00:00 via INTRAVENOUS

## 2015-04-19 MED ORDER — ACETAMINOPHEN 325 MG PO TABS
650.0000 mg | ORAL_TABLET | Freq: Once | ORAL | Status: AC
  Administered 2015-04-19: 650 mg via ORAL

## 2015-04-19 MED ORDER — DIPHENHYDRAMINE HCL 25 MG PO CAPS
50.0000 mg | ORAL_CAPSULE | Freq: Once | ORAL | Status: AC
  Administered 2015-04-19: 50 mg via ORAL

## 2015-04-19 MED ORDER — ACETAMINOPHEN 325 MG PO TABS
ORAL_TABLET | ORAL | Status: AC
  Filled 2015-04-19: qty 2

## 2015-04-19 MED ORDER — DIPHENHYDRAMINE HCL 25 MG PO CAPS
ORAL_CAPSULE | ORAL | Status: AC
  Filled 2015-04-19: qty 2

## 2015-04-19 MED ORDER — SODIUM CHLORIDE 0.9 % IV SOLN
375.0000 mg/m2 | Freq: Once | INTRAVENOUS | Status: AC
  Administered 2015-04-19: 700 mg via INTRAVENOUS
  Filled 2015-04-19: qty 20

## 2015-04-19 NOTE — Telephone Encounter (Signed)
Pt confirmed labs/ov per 02/14 POF, gave pt AVS and Calendar... KJ, sent msg to add chemo and gave pt barium

## 2015-04-19 NOTE — Telephone Encounter (Signed)
Per staff message and POF I have scheduled appts. Advised scheduler of appts. JMW  

## 2015-04-19 NOTE — Patient Instructions (Signed)
[facility] Discharge Instructions for Patients Receiving Chemotherapy  Today you received the following chemotherapy agents Rituxan To help prevent nausea and vomiting after your treatment, we encourage you to take your nausea medication as prescribed.  If you develop nausea and vomiting that is not controlled by your nausea medication, call the clinic.   BELOW ARE SYMPTOMS THAT SHOULD BE REPORTED IMMEDIATELY:  *FEVER GREATER THAN 100.5 F  *CHILLS WITH OR WITHOUT FEVER  NAUSEA AND VOMITING THAT IS NOT CONTROLLED WITH YOUR NAUSEA MEDICATION  *UNUSUAL SHORTNESS OF BREATH  *UNUSUAL BRUISING OR BLEEDING  TENDERNESS IN MOUTH AND THROAT WITH OR WITHOUT PRESENCE OF ULCERS  *URINARY PROBLEMS  *BOWEL PROBLEMS  UNUSUAL RASH Items with * indicate a potential emergency and should be followed up as soon as possible.  Feel free to call the clinic you have any questions or concerns. The clinic phone number is [phone].  Please show the CHEMO ALERT CARD at check-in to the Emergency Department and triage nurse.   

## 2015-04-19 NOTE — Progress Notes (Signed)
[place] Telephone:([phone]) [phone]   Fax:([phone]) [place], [place] [address]  PRINCIPAL DIAGNOSIS: Bulky stage IV low grade lymphoma diagnosed in November 2009.   PRIOR THERAPY:  1. Status post 7 cycles of systemic chemotherapy with CHOP/Rituxan. Last dose was given May 2010. 2. Status post 6 cycles of maintenance Rituxan 375 mg/sq m given every 2 months. Last dose was given on May 29, 2010, and the patient was lost to followup at that time. He received another dose on 12/11/2010, then again missed 2 doses. 3. systemic chemotherapy with Rituxan 375 mg/M2 on day 1 and that bendamustine 90 mg/M2 on days 1 and 2 every 4 weeks, status post 6 cycles. 4. Maintenance Rituxan 375 mg/M2 every 2 months, status post 12 cycles   CURRENT THERAPY: Rituxan 375 MG/M2 weekly for 4 weeks, last dose today, followed by maintenance treatment every 2 months. Status post 5 cycles.  INTERVAL HISTORY: Eric [name] 75 y.o. male returns to the clinic today for two-month followup visit. The patient is feeling fine today with no specific complaints. He has been tolerating his treatment with weekly Rituxan fairly well with no significant adverse effects. He denied having any significant chest pain, shortness of breath, cough or hemoptysis. He denied having any weight loss or night sweats. He has no fever or chills, no nausea or vomiting. He has no lymphadenopathy. He is here today for cycle #6 of the weekly Rituxan.  MEDICAL HISTORY: Past Medical History  Diagnosis Date  . History of heart attack   . Cancer ([place])   . Non Hodgkin's lymphoma ([place])   . Coronary atherosclerosis of native coronary artery   . Hyperlipidemia   . Myocardial infarction ([place])   . GERD (gastroesophageal reflux disease)     ALLERGIES:  is allergic to neosporin.  MEDICATIONS:  Current Outpatient Prescriptions  Medication Sig Dispense Refill  .  aspirin EC 325 MG EC tablet Take 1 tablet (325 mg total) by mouth daily. 30 tablet   . atorvastatin (LIPITOR) 40 MG tablet TAKE 1 TABLET (40 MG TOTAL) BY MOUTH DAILY AT 6 PM. 90 tablet 0  . diphenhydrAMINE (BENADRYL) 25 MG tablet Take 25 mg by mouth at bedtime as needed. Sleep    . LORazepam (ATIVAN) 0.5 MG tablet Take 1 tablet (0.5 mg total) by mouth 2 (two) times daily as needed for anxiety. 30 tablet 1  . nitroGLYCERIN (NITROSTAT) 0.4 MG SL tablet Place 1 tablet (0.4 mg total) under the tongue every 5 (five) minutes x 3 doses as needed for chest pain. (Patient not taking: Reported on 04/19/2015) 25 tablet 3  . sildenafil (VIAGRA) 25 MG tablet Take 1 tablet (25 mg total) by mouth daily as needed. Take 2-3 tablets by mouth once daily as needed. 90 tablet 6   No current facility-administered medications for this visit.    SURGICAL HISTORY:  Past Surgical History  Procedure Laterality Date  . Coronary stent placement    . Left heart catheterization with coronary angiogram N/A 03/03/2012    Procedure: LEFT HEART CATHETERIZATION WITH CORONARY ANGIOGRAM;  Surgeon: [name], MD;  Location: [facility] CATH LAB;  Service: Cardiovascular;  Laterality: N/A;  . Percutaneous coronary stent intervention (pci-s) N/A 03/04/2012    Procedure: PERCUTANEOUS CORONARY STENT INTERVENTION (PCI-S);  Surgeon: [name], MD;  Location: [facility] CATH LAB;  Service: Cardiovascular;  Laterality: N/A;    REVIEW OF SYSTEMS:  A  comprehensive review of systems was negative.   PHYSICAL EXAMINATION: General appearance: alert, cooperative and no distress Head: Normocephalic, without obvious abnormality, atraumatic Neck: no adenopathy Lymph nodes: Cervical, supraclavicular, and axillary nodes normal. Resp: clear to auscultation bilaterally Back: symmetric, no curvature. ROM normal. No CVA tenderness. Cardio: regular rate and rhythm, S1, S2 normal, no murmur, click, rub or gallop GI: soft, non-tender; bowel sounds normal; no  masses,  no organomegaly Extremities: extremities normal, atraumatic, no cyanosis or edema Neurologic: Alert and oriented X 3, normal strength and tone. Normal symmetric reflexes. Normal coordination and gait  ECOG PERFORMANCE STATUS: 0 - Asymptomatic  Blood pressure 117/80, pulse 78, temperature 98 F (36.7 C), temperature source Oral, resp. rate 18, height '5\' 7"'$  (1.702 m), weight 151 lb 9.6 oz (68.765 kg), SpO2 100 %.  LABORATORY DATA: Lab Results  Component Value Date   WBC 4.6 04/19/2015   HGB 16.5 04/19/2015   HCT 48.3 04/19/2015   MCV 90.3 04/19/2015   PLT 219 04/19/2015      Chemistry      Component Value Date/Time   NA 139 02/16/2015 0856   NA 139 01/05/2015 1440   NA 141 03/23/2010 1301   K 4.0 02/16/2015 0856   K 4.5 01/05/2015 1440   K 4.7 03/23/2010 1301   CL 102 01/05/2015 1440   CL 106 08/06/2012 1244   CL 99 03/23/2010 1301   CO2 24 02/16/2015 0856   CO2 25 01/05/2015 1440   CO2 26 03/23/2010 1301   BUN 15.9 02/16/2015 0856   BUN 15 01/05/2015 1440   BUN 12 03/23/2010 1301   CREATININE 1.2 02/16/2015 0856   CREATININE 1.07 01/05/2015 1440   CREATININE 0.92 03/05/2012 0545      Component Value Date/Time   CALCIUM 9.1 02/16/2015 0856   CALCIUM 9.5 01/05/2015 1440   CALCIUM 9.1 03/23/2010 1301   ALKPHOS 104 02/16/2015 0856   ALKPHOS 101 01/05/2015 1440   ALKPHOS 78 03/23/2010 1301   AST 28 02/16/2015 0856   AST 24 01/05/2015 1440   AST 25 03/23/2010 1301   ALT 24 02/16/2015 0856   ALT 17 01/05/2015 1440   ALT 20 03/23/2010 1301   BILITOT 0.58 02/16/2015 0856   BILITOT 0.7 01/05/2015 1440   BILITOT 0.60 03/23/2010 1301       RADIOGRAPHIC STUDIES: No results found. ASSESSMENT AND PLAN: This is a very pleasant 75 years old male with history of bulky stage IV low-grade lymphoma completed maintenance Rituxan status post 12 cycles. Has been observation but the recent CT scan of the chest, abdomen and pelvis showed evidence for disease  progression. The patient was started on weekly Rituxan status post 5 cycles, last dose was given 2 months ago. The patient is here today to start cycle #6 of his treatment with Rituxan.   I recommended for him to proceed with the treatment today as a scheduled. He would come back for follow-up visit in 2 months for reevaluation with repeat CT scan of the chest, abdomen and pelvis for restaging of his disease before starting cycle #7.  He was advised to call immediately if he has any concerning symptoms in the interval. The patient voices understanding of current disease status and treatment options and is in agreement with the current care plan.  All questions were answered. The patient knows to call the clinic with any problems, questions or concerns. We can certainly see the patient much sooner if necessary.  Disclaimer: This note was dictated with voice  recognition software. Similar sounding words can inadvertently be transcribed and may not be corrected upon review.

## 2015-06-13 ENCOUNTER — Encounter (HOSPITAL_COMMUNITY): Payer: Self-pay

## 2015-06-13 ENCOUNTER — Ambulatory Visit (HOSPITAL_COMMUNITY)
Admission: RE | Admit: 2015-06-13 | Discharge: 2015-06-13 | Disposition: A | Discharge status: Home / Self Care | Payer: Medicare Other | Source: Ambulatory Visit | Attending: Internal Medicine | Admitting: Internal Medicine

## 2015-06-13 ENCOUNTER — Other Ambulatory Visit (HOSPITAL_BASED_OUTPATIENT_CLINIC_OR_DEPARTMENT_OTHER): Payer: Medicare Other

## 2015-06-13 DIAGNOSIS — K449 Diaphragmatic hernia without obstruction or gangrene: Secondary | ICD-10-CM | POA: Insufficient documentation

## 2015-06-13 DIAGNOSIS — C8208 Follicular lymphoma grade I, lymph nodes of multiple sites: Secondary | ICD-10-CM

## 2015-06-13 DIAGNOSIS — N400 Enlarged prostate without lower urinary tract symptoms: Secondary | ICD-10-CM | POA: Diagnosis not present

## 2015-06-13 DIAGNOSIS — K5730 Diverticulosis of large intestine without perforation or abscess without bleeding: Secondary | ICD-10-CM | POA: Diagnosis not present

## 2015-06-13 DIAGNOSIS — Z9221 Personal history of antineoplastic chemotherapy: Secondary | ICD-10-CM | POA: Insufficient documentation

## 2015-06-13 DIAGNOSIS — C8590 Non-Hodgkin lymphoma, unspecified, unspecified site: Secondary | ICD-10-CM | POA: Diagnosis not present

## 2015-06-13 DIAGNOSIS — R590 Localized enlarged lymph nodes: Secondary | ICD-10-CM | POA: Insufficient documentation

## 2015-06-13 DIAGNOSIS — I2510 Atherosclerotic heart disease of native coronary artery without angina pectoris: Secondary | ICD-10-CM | POA: Insufficient documentation

## 2015-06-13 LAB — CBC WITH DIFFERENTIAL/PLATELET
BASO%: 0.5 % (ref 0.0–2.0)
BASOS ABS: 0 10*3/uL (ref 0.0–0.1)
EOS%: 4.5 % (ref 0.0–7.0)
Eosinophils Absolute: 0.2 10*3/uL (ref 0.0–0.5)
HCT: 41.7 % (ref 38.4–49.9)
HEMOGLOBIN: 14.3 g/dL (ref 13.0–17.1)
LYMPH#: 1.1 10*3/uL (ref 0.9–3.3)
LYMPH%: 27.7 % (ref 14.0–49.0)
MCH: 30.6 pg (ref 27.2–33.4)
MCHC: 34.3 g/dL (ref 32.0–36.0)
MCV: 89.3 fL (ref 79.3–98.0)
MONO#: 0.9 10*3/uL (ref 0.1–0.9)
MONO%: 21.3 % — ABNORMAL HIGH (ref 0.0–14.0)
NEUT%: 46 % (ref 39.0–75.0)
NEUTROS ABS: 1.9 10*3/uL (ref 1.5–6.5)
NRBC: 0 % (ref 0–0)
Platelets: 234 10*3/uL (ref 140–400)
RBC: 4.67 10*6/uL (ref 4.20–5.82)
RDW: 13.7 % (ref 11.0–14.6)
WBC: 4 10*3/uL (ref 4.0–10.3)

## 2015-06-13 LAB — COMPREHENSIVE METABOLIC PANEL
ALBUMIN: 3.6 g/dL (ref 3.5–5.0)
ALK PHOS: 116 U/L (ref 40–150)
ALT: 20 U/L (ref 0–55)
AST: 25 U/L (ref 5–34)
Anion Gap: 9 mEq/L (ref 3–11)
BILIRUBIN TOTAL: 0.53 mg/dL (ref 0.20–1.20)
BUN: 14 mg/dL (ref 7.0–26.0)
CO2: 27 meq/L (ref 22–29)
CREATININE: 1.1 mg/dL (ref 0.7–1.3)
Calcium: 9.5 mg/dL (ref 8.4–10.4)
Chloride: 106 mEq/L (ref 98–109)
EGFR: 69 mL/min/{1.73_m2} — AB (ref >90)
GLUCOSE: 86 mg/dL (ref 70–140)
Potassium: 4.1 mEq/L (ref 3.5–5.1)
SODIUM: 142 meq/L (ref 136–145)
TOTAL PROTEIN: 6.5 g/dL (ref 6.4–8.3)

## 2015-06-13 LAB — LACTATE DEHYDROGENASE: LDH: 220 U/L (ref 125–245)

## 2015-06-13 MED ORDER — IOPAMIDOL (ISOVUE-300) INJECTION 61%
100.0000 mL | Freq: Once | INTRAVENOUS | Status: AC | PRN
  Administered 2015-06-13: 100 mL via INTRAVENOUS

## 2015-06-20 ENCOUNTER — Telehealth: Payer: Self-pay | Admitting: Internal Medicine

## 2015-06-20 ENCOUNTER — Ambulatory Visit (HOSPITAL_BASED_OUTPATIENT_CLINIC_OR_DEPARTMENT_OTHER): Payer: Medicare Other

## 2015-06-20 ENCOUNTER — Ambulatory Visit (HOSPITAL_BASED_OUTPATIENT_CLINIC_OR_DEPARTMENT_OTHER): Payer: Medicare Other | Admitting: Internal Medicine

## 2015-06-20 ENCOUNTER — Encounter: Payer: Self-pay | Admitting: Internal Medicine

## 2015-06-20 VITALS — BP 119/82 | HR 71 | Temp 97.9°F | Resp 18 | Ht 67.0 in | Wt 156.7 lb

## 2015-06-20 VITALS — BP 117/62 | HR 67 | Temp 97.6°F | Resp 18

## 2015-06-20 DIAGNOSIS — C8208 Follicular lymphoma grade I, lymph nodes of multiple sites: Secondary | ICD-10-CM

## 2015-06-20 DIAGNOSIS — Z5112 Encounter for antineoplastic immunotherapy: Secondary | ICD-10-CM

## 2015-06-20 DIAGNOSIS — Z5111 Encounter for antineoplastic chemotherapy: Secondary | ICD-10-CM

## 2015-06-20 HISTORY — DX: Encounter for antineoplastic chemotherapy: Z51.11

## 2015-06-20 MED ORDER — SODIUM CHLORIDE 0.9 % IV SOLN
Freq: Once | INTRAVENOUS | Status: AC
  Administered 2015-06-20: 10:00:00 via INTRAVENOUS

## 2015-06-20 MED ORDER — SODIUM CHLORIDE 0.9 % IV SOLN
375.0000 mg/m2 | Freq: Once | INTRAVENOUS | Status: AC
  Administered 2015-06-20: 700 mg via INTRAVENOUS
  Filled 2015-06-20: qty 60

## 2015-06-20 MED ORDER — DIPHENHYDRAMINE HCL 25 MG PO CAPS
ORAL_CAPSULE | ORAL | Status: AC
  Filled 2015-06-20: qty 2

## 2015-06-20 MED ORDER — DIPHENHYDRAMINE HCL 25 MG PO CAPS
50.0000 mg | ORAL_CAPSULE | Freq: Once | ORAL | Status: AC
  Administered 2015-06-20: 50 mg via ORAL

## 2015-06-20 MED ORDER — ACETAMINOPHEN 325 MG PO TABS
ORAL_TABLET | ORAL | Status: AC
  Filled 2015-06-20: qty 2

## 2015-06-20 MED ORDER — ACETAMINOPHEN 325 MG PO TABS
650.0000 mg | ORAL_TABLET | Freq: Once | ORAL | Status: AC
  Administered 2015-06-20: 650 mg via ORAL

## 2015-06-20 NOTE — Progress Notes (Signed)
[place] [phone]   Fax:([phone] [place], [place] [address]  PRINCIPAL DIAGNOSIS: Bulky stage IV low grade lymphoma diagnosed in November 2009.   PRIOR THERAPY:  1. Status post 7 cycles of systemic chemotherapy with CHOP/Rituxan. Last dose was given May 2010. 2. Status post 6 cycles of maintenance Rituxan 375 mg/sq m given every 2 months. Last dose was given on May 29, 2010, and the patient was lost to followup at that time. He received another dose on 12/11/2010, then again missed 2 doses. 3. systemic chemotherapy with Rituxan 375 mg/M2 on day 1 and that bendamustine 90 mg/M2 on days 1 and 2 every 4 weeks, status post 6 cycles. 4. Maintenance Rituxan 375 mg/M2 every 2 months, status post 12 cycles   CURRENT THERAPY: Rituxan 375 MG/M2 weekly for 4 weeks, last dose today, followed by maintenance treatment every 2 months. Status post 6 cycles.  INTERVAL HISTORY: Eric [name] 75 y.o. male returns to the clinic today for two-month followup visit. The patient is feeling fine today with no specific complaints. He has been tolerating his treatment with weekly Rituxan fairly well with no significant adverse effects. He denied having any significant chest pain, shortness of breath, cough or hemoptysis. He denied having any weight loss or night sweats. He has no fever or chills, no nausea or vomiting. He has no lymphadenopathy. He is here today for cycle #6 of the weekly Rituxan.  MEDICAL HISTORY: Past Medical History  Diagnosis Date  . History of heart attack   . Cancer ([place])   . Non Hodgkin's lymphoma ([place])   . Coronary atherosclerosis of native coronary artery   . Hyperlipidemia   . Myocardial infarction ([place])   . GERD (gastroesophageal reflux disease)     ALLERGIES:  is allergic to neosporin.  MEDICATIONS:  Current Outpatient Prescriptions  Medication Sig Dispense Refill  .  aspirin EC 325 MG EC tablet Take 1 tablet (325 mg total) by mouth daily. 30 tablet   . atorvastatin (LIPITOR) 40 MG tablet TAKE 1 TABLET (40 MG TOTAL) BY MOUTH DAILY AT 6 PM. 90 tablet 0  . diphenhydrAMINE (BENADRYL) 25 MG tablet Take 25 mg by mouth at bedtime as needed. Sleep    . LORazepam (ATIVAN) 0.5 MG tablet Take 1 tablet (0.5 mg total) by mouth 2 (two) times daily as needed for anxiety. 30 tablet 1  . nitroGLYCERIN (NITROSTAT) 0.4 MG SL tablet Place 1 tablet (0.4 mg total) under the tongue every 5 (five) minutes x 3 doses as needed for chest pain. 25 tablet 3  . sildenafil (VIAGRA) 25 MG tablet Take 1 tablet (25 mg total) by mouth daily as needed. Take 2-3 tablets by mouth once daily as needed. 90 tablet 6   No current facility-administered medications for this visit.    SURGICAL HISTORY:  Past Surgical History  Procedure Laterality Date  . Coronary stent placement    . Left heart catheterization with coronary angiogram N/A 03/03/2012    Procedure: LEFT HEART CATHETERIZATION WITH CORONARY ANGIOGRAM;  Surgeon: [name], MD;  Location: [facility] CATH LAB;  Service: Cardiovascular;  Laterality: N/A;  . Percutaneous coronary stent intervention (pci-s) N/A 03/04/2012    Procedure: PERCUTANEOUS CORONARY STENT INTERVENTION (PCI-S);  Surgeon: [name], MD;  Location: [facility] CATH LAB;  Service: Cardiovascular;  Laterality: N/A;    REVIEW OF SYSTEMS:  Constitutional: negative Eyes: negative Ears, nose, mouth,  throat, and face: negative Respiratory: negative Cardiovascular: negative Gastrointestinal: negative Genitourinary:negative Integument/breast: negative Hematologic/lymphatic: negative Musculoskeletal:negative Neurological: negative Behavioral/Psych: negative Endocrine: negative Allergic/Immunologic: negative   PHYSICAL EXAMINATION: General appearance: alert, cooperative and no distress Head: Normocephalic, without obvious abnormality, atraumatic Neck: no adenopathy Lymph  nodes: Cervical, supraclavicular, and axillary nodes normal. Resp: clear to auscultation bilaterally Back: symmetric, no curvature. ROM normal. No CVA tenderness. Cardio: regular rate and rhythm, S1, S2 normal, no murmur, click, rub or gallop GI: soft, non-tender; bowel sounds normal; no masses,  no organomegaly Extremities: extremities normal, atraumatic, no cyanosis or edema Neurologic: Alert and oriented X 3, normal strength and tone. Normal symmetric reflexes. Normal coordination and gait  ECOG PERFORMANCE STATUS: 0 - Asymptomatic  Blood pressure 119/82, pulse 71, temperature 97.9 F (36.6 C), temperature source Oral, resp. rate 18, height '5\' 7"'$  (1.702 m), weight 156 lb 11.2 oz (71.079 kg), SpO2 100 %.  LABORATORY DATA: Lab Results  Component Value Date   WBC 4.0 06/13/2015   HGB 14.3 06/13/2015   HCT 41.7 06/13/2015   MCV 89.3 06/13/2015   PLT 234 06/13/2015      Chemistry      Component Value Date/Time   NA 142 06/13/2015 0911   NA 139 01/05/2015 1440   NA 141 03/23/2010 1301   K 4.1 06/13/2015 0911   K 4.5 01/05/2015 1440   K 4.7 03/23/2010 1301   CL 102 01/05/2015 1440   CL 106 08/06/2012 1244   CL 99 03/23/2010 1301   CO2 27 06/13/2015 0911   CO2 25 01/05/2015 1440   CO2 26 03/23/2010 1301   BUN 14.0 06/13/2015 0911   BUN 15 01/05/2015 1440   BUN 12 03/23/2010 1301   CREATININE 1.1 06/13/2015 0911   CREATININE 1.07 01/05/2015 1440   CREATININE 0.92 03/05/2012 0545      Component Value Date/Time   CALCIUM 9.5 06/13/2015 0911   CALCIUM 9.5 01/05/2015 1440   CALCIUM 9.1 03/23/2010 1301   ALKPHOS 116 06/13/2015 0911   ALKPHOS 101 01/05/2015 1440   ALKPHOS 78 03/23/2010 1301   AST 25 06/13/2015 0911   AST 24 01/05/2015 1440   AST 25 03/23/2010 1301   ALT 20 06/13/2015 0911   ALT 17 01/05/2015 1440   ALT 20 03/23/2010 1301   BILITOT 0.53 06/13/2015 0911   BILITOT 0.7 01/05/2015 1440   BILITOT 0.60 03/23/2010 1301       RADIOGRAPHIC STUDIES: Ct  Chest W Contrast  06/13/2015  CLINICAL DATA:  75 year old male with history of non-Hodgkin's lymphoma undergoing ongoing chemotherapy. EXAM: CT CHEST, ABDOMEN, AND PELVIS WITH CONTRAST TECHNIQUE: Multidetector CT imaging of the chest, abdomen and pelvis was performed following the standard protocol during bolus administration of intravenous contrast. CONTRAST:  159m ISOVUE-300 IOPAMIDOL (ISOVUE-300) INJECTION 61% COMPARISON:  Multiple priors, most recently CT of the chest, abdomen and pelvis 11/03/2014. FINDINGS: CT CHEST FINDINGS Mediastinum/Lymph Nodes: Heart size is normal. There is no significant pericardial fluid, thickening or pericardial calcification. There is atherosclerosis of the thoracic aorta, the great vessels of the mediastinum and the coronary arteries, including calcified atherosclerotic plaque in the left main, left anterior descending, left circumflex and right coronary arteries. Stent in the left anterior descending and right coronary arteries. No pathologically enlarged mediastinal or hilar lymph nodes. Moderate-sized hiatal hernia. Surgical clips in the right axilla from prior lymph node dissection. Previously noted left axillary lymphadenopathy appears to have resolved, with the largest residual left axillary lymph node measuring up to 7 mm in  short axis (assessment is slightly limited by a large amount of beam hardening artifact from the patient's left upper extremity injection and high attenuation contrast material within the left axillary and subclavian veins). Lungs/Pleura: No suspicious appearing pulmonary nodules or masses. No acute consolidative airspace disease. No pleural effusions. Musculoskeletal/Soft Tissues: There are no aggressive appearing lytic or blastic lesions noted in the visualized portions of the skeleton. CT ABDOMEN AND PELVIS FINDINGS Hepatobiliary: Diffuse low attenuation throughout the hepatic parenchyma, compatible with hepatic steatosis. No cystic or solid hepatic  lesions. No intra or extrahepatic biliary ductal dilatation. Gallbladder is normal in appearance. Pancreas: No pancreatic mass. No pancreatic ductal dilatation. No pancreatic or peripancreatic fluid or inflammatory changes. Spleen: Unremarkable. Adrenals/Urinary Tract: Simple cysts in the kidneys bilaterally, the largest of which measures up to 5.1 cm in the lateral aspect of the lower pole of the right kidney. No suspicious renal lesions. No hydroureteronephrosis. Urinary bladder is thick walled, but otherwise unremarkable in appearance. Bilateral adrenal glands are normal in appearance. Stomach/Bowel: Intra abdominal portion of the stomach is normal. There is no pathologic dilatation of small bowel or colon. Numerous colonic diverticulae are noted, most evident in the descending colon and sigmoid colon, without surrounding inflammatory changes to suggest an acute diverticulitis at this time. Normal appendix. Vascular/Lymphatic: Extensive atherosclerosis throughout the abdominal and pelvic vasculature, without evidence of aneurysm or dissection. Overall, the extensive mesenteric lymphadenopathy noted on the prior study has significantly improved. One exception is an enlarged lymph node associated with the jejunal mesentery in the left upper quadrant (image 69 of series 2) which currently measures 14 mm in short axis (previously only 10 mm on prior study 11/03/2014). However, the lymphadenopathy throughout the ileocolic mesentery noted on the prior study has essentially resolved, as has the previously noted retroperitoneal lymphadenopathy. No other new or enlarging lymph nodes are identified on today's examination. Reproductive: Prostate gland remains massively enlarged measuring 8.3 x 6.7 cm. Seminal vesicles are unremarkable in appearance. Other: No significant volume of ascites.  No pneumoperitoneum. Musculoskeletal: There are no aggressive appearing lytic or blastic lesions noted in the visualized portions of  the skeleton. IMPRESSION: 1. Today's study demonstrates further positive response to therapy. Specifically, previously noted left axillary and retroperitoneal lymphadenopathy has completely resolved. Mesenteric lymphadenopathy is also largely resolved, with exception of one lymph node associated with the jejunal mesentery which has increased in size compared to the prior examination. No new foci of disease are noted elsewhere in the chest, abdomen or pelvis on today's examination. 2. Colonic diverticulosis without evidence of acute diverticulitis at this time. 3. Atherosclerosis, including left main and 3 vessel coronary artery disease. Assessment for potential risk factor modification, dietary therapy or pharmacologic therapy may be warranted, if clinically indicated. 4. Moderate-sized hiatal hernia. 5. Markedly enlarged prostate gland redemonstrated, with thick walled urinary bladder, suggesting chronic bladder outlet obstruction. 6. Additional incidental findings, as above. Electronically Signed   By: Vinnie Langton M.D.   On: 06/13/2015 12:07   Ct Abdomen Pelvis W Contrast  06/13/2015  CLINICAL DATA:  75 year old male with history of non-Hodgkin's lymphoma undergoing ongoing chemotherapy. EXAM: CT CHEST, ABDOMEN, AND PELVIS WITH CONTRAST TECHNIQUE: Multidetector CT imaging of the chest, abdomen and pelvis was performed following the standard protocol during bolus administration of intravenous contrast. CONTRAST:  171m ISOVUE-300 IOPAMIDOL (ISOVUE-300) INJECTION 61% COMPARISON:  Multiple priors, most recently CT of the chest, abdomen and pelvis 11/03/2014. FINDINGS: CT CHEST FINDINGS Mediastinum/Lymph Nodes: Heart size is normal. There is no significant pericardial  fluid, thickening or pericardial calcification. There is atherosclerosis of the thoracic aorta, the great vessels of the mediastinum and the coronary arteries, including calcified atherosclerotic plaque in the left main, left anterior descending,  left circumflex and right coronary arteries. Stent in the left anterior descending and right coronary arteries. No pathologically enlarged mediastinal or hilar lymph nodes. Moderate-sized hiatal hernia. Surgical clips in the right axilla from prior lymph node dissection. Previously noted left axillary lymphadenopathy appears to have resolved, with the largest residual left axillary lymph node measuring up to 7 mm in short axis (assessment is slightly limited by a large amount of beam hardening artifact from the patient's left upper extremity injection and high attenuation contrast material within the left axillary and subclavian veins). Lungs/Pleura: No suspicious appearing pulmonary nodules or masses. No acute consolidative airspace disease. No pleural effusions. Musculoskeletal/Soft Tissues: There are no aggressive appearing lytic or blastic lesions noted in the visualized portions of the skeleton. CT ABDOMEN AND PELVIS FINDINGS Hepatobiliary: Diffuse low attenuation throughout the hepatic parenchyma, compatible with hepatic steatosis. No cystic or solid hepatic lesions. No intra or extrahepatic biliary ductal dilatation. Gallbladder is normal in appearance. Pancreas: No pancreatic mass. No pancreatic ductal dilatation. No pancreatic or peripancreatic fluid or inflammatory changes. Spleen: Unremarkable. Adrenals/Urinary Tract: Simple cysts in the kidneys bilaterally, the largest of which measures up to 5.1 cm in the lateral aspect of the lower pole of the right kidney. No suspicious renal lesions. No hydroureteronephrosis. Urinary bladder is thick walled, but otherwise unremarkable in appearance. Bilateral adrenal glands are normal in appearance. Stomach/Bowel: Intra abdominal portion of the stomach is normal. There is no pathologic dilatation of small bowel or colon. Numerous colonic diverticulae are noted, most evident in the descending colon and sigmoid colon, without surrounding inflammatory changes to suggest  an acute diverticulitis at this time. Normal appendix. Vascular/Lymphatic: Extensive atherosclerosis throughout the abdominal and pelvic vasculature, without evidence of aneurysm or dissection. Overall, the extensive mesenteric lymphadenopathy noted on the prior study has significantly improved. One exception is an enlarged lymph node associated with the jejunal mesentery in the left upper quadrant (image 69 of series 2) which currently measures 14 mm in short axis (previously only 10 mm on prior study 11/03/2014). However, the lymphadenopathy throughout the ileocolic mesentery noted on the prior study has essentially resolved, as has the previously noted retroperitoneal lymphadenopathy. No other new or enlarging lymph nodes are identified on today's examination. Reproductive: Prostate gland remains massively enlarged measuring 8.3 x 6.7 cm. Seminal vesicles are unremarkable in appearance. Other: No significant volume of ascites.  No pneumoperitoneum. Musculoskeletal: There are no aggressive appearing lytic or blastic lesions noted in the visualized portions of the skeleton. IMPRESSION: 1. Today's study demonstrates further positive response to therapy. Specifically, previously noted left axillary and retroperitoneal lymphadenopathy has completely resolved. Mesenteric lymphadenopathy is also largely resolved, with exception of one lymph node associated with the jejunal mesentery which has increased in size compared to the prior examination. No new foci of disease are noted elsewhere in the chest, abdomen or pelvis on today's examination. 2. Colonic diverticulosis without evidence of acute diverticulitis at this time. 3. Atherosclerosis, including left main and 3 vessel coronary artery disease. Assessment for potential risk factor modification, dietary therapy or pharmacologic therapy may be warranted, if clinically indicated. 4. Moderate-sized hiatal hernia. 5. Markedly enlarged prostate gland redemonstrated, with  thick walled urinary bladder, suggesting chronic bladder outlet obstruction. 6. Additional incidental findings, as above. Electronically Signed   By: Mauri Brooklyn.D.  On: 06/13/2015 12:07   ASSESSMENT AND PLAN: This is a very pleasant 75 years old male with history of bulky stage IV low-grade lymphoma completed maintenance Rituxan status post 12 cycles. Has been observation but the recent CT scan of the chest, abdomen and pelvis showed evidence for disease progression. The patient was started on weekly Rituxan status post 6 cycles. The recent CT scan of the chest, abdomen and pelvis showed response to the treatment. I discussed the scan results with the patient today. The patient is here today to start cycle #7 of his treatment with Rituxan.   I recommended for him to proceed with the treatment today as a scheduled. He would come back for follow-up visit in 2 months for reevaluation before starting cycle #8.  He was advised to call immediately if he has any concerning symptoms in the interval. The patient voices understanding of current disease status and treatment options and is in agreement with the current care plan.  All questions were answered. The patient knows to call the clinic with any problems, questions or concerns. We can certainly see the patient much sooner if necessary.  Disclaimer: This note was dictated with voice recognition software. Similar sounding words can inadvertently be transcribed and may not be corrected upon review.

## 2015-06-20 NOTE — Patient Instructions (Signed)
[facility] Discharge Instructions for Patients Receiving Chemotherapy  Today you received the following chemotherapy agents: Rituxan   To help prevent nausea and vomiting after your treatment, we encourage you to take your nausea medication as directed.    If you develop nausea and vomiting that is not controlled by your nausea medication, call the clinic.   BELOW ARE SYMPTOMS THAT SHOULD BE REPORTED IMMEDIATELY:  *FEVER GREATER THAN 100.5 F  *CHILLS WITH OR WITHOUT FEVER  NAUSEA AND VOMITING THAT IS NOT CONTROLLED WITH YOUR NAUSEA MEDICATION  *UNUSUAL SHORTNESS OF BREATH  *UNUSUAL BRUISING OR BLEEDING  TENDERNESS IN MOUTH AND THROAT WITH OR WITHOUT PRESENCE OF ULCERS  *URINARY PROBLEMS  *BOWEL PROBLEMS  UNUSUAL RASH Items with * indicate a potential emergency and should be followed up as soon as possible.  Feel free to call the clinic you have any questions or concerns. The clinic phone number is [phone].  Please show the CHEMO ALERT CARD at check-in to the Emergency Department and triage nurse.   

## 2015-06-20 NOTE — Telephone Encounter (Signed)
Gave and printed appt sched and avs for pt for June and aug

## 2015-08-01 ENCOUNTER — Other Ambulatory Visit: Payer: Self-pay | Admitting: Urgent Care

## 2015-08-02 NOTE — Telephone Encounter (Signed)
Patient has only had one refill of this prescription for insomnia since 02/2015. Script printed, please notify patient.

## 2015-08-03 NOTE — Telephone Encounter (Signed)
Faxed and asked pharm to notify pt when ready.

## 2015-08-17 ENCOUNTER — Telehealth: Payer: Self-pay | Admitting: Internal Medicine

## 2015-08-17 ENCOUNTER — Other Ambulatory Visit (HOSPITAL_BASED_OUTPATIENT_CLINIC_OR_DEPARTMENT_OTHER): Payer: Medicare Other

## 2015-08-17 ENCOUNTER — Encounter: Payer: Self-pay | Admitting: Internal Medicine

## 2015-08-17 ENCOUNTER — Ambulatory Visit (HOSPITAL_BASED_OUTPATIENT_CLINIC_OR_DEPARTMENT_OTHER): Payer: Medicare Other

## 2015-08-17 ENCOUNTER — Ambulatory Visit (HOSPITAL_BASED_OUTPATIENT_CLINIC_OR_DEPARTMENT_OTHER): Payer: Medicare Other | Admitting: Internal Medicine

## 2015-08-17 VITALS — BP 129/83 | HR 75 | Temp 98.3°F | Resp 18 | Ht 67.0 in | Wt 157.5 lb

## 2015-08-17 VITALS — BP 122/74 | HR 66 | Temp 97.8°F | Resp 17

## 2015-08-17 DIAGNOSIS — C8208 Follicular lymphoma grade I, lymph nodes of multiple sites: Secondary | ICD-10-CM

## 2015-08-17 DIAGNOSIS — Z5112 Encounter for antineoplastic immunotherapy: Secondary | ICD-10-CM

## 2015-08-17 DIAGNOSIS — Z5111 Encounter for antineoplastic chemotherapy: Secondary | ICD-10-CM

## 2015-08-17 LAB — COMPREHENSIVE METABOLIC PANEL
ALBUMIN: 3.7 g/dL (ref 3.5–5.0)
ALK PHOS: 106 U/L (ref 40–150)
ALT: 13 U/L (ref 0–55)
AST: 22 U/L (ref 5–34)
Anion Gap: 9 mEq/L (ref 3–11)
BUN: 15.7 mg/dL (ref 7.0–26.0)
CALCIUM: 9 mg/dL (ref 8.4–10.4)
CO2: 23 mEq/L (ref 22–29)
Chloride: 106 mEq/L (ref 98–109)
Creatinine: 1 mg/dL (ref 0.7–1.3)
EGFR: 70 mL/min/{1.73_m2} — AB (ref >90)
Glucose: 87 mg/dl (ref 70–140)
POTASSIUM: 4.3 meq/L (ref 3.5–5.1)
Sodium: 138 mEq/L (ref 136–145)
Total Bilirubin: 0.5 mg/dL (ref 0.20–1.20)
Total Protein: 6.2 g/dL — ABNORMAL LOW (ref 6.4–8.3)

## 2015-08-17 LAB — CBC WITH DIFFERENTIAL/PLATELET
BASO%: 0.8 % (ref 0.0–2.0)
BASOS ABS: 0 10*3/uL (ref 0.0–0.1)
EOS%: 7 % (ref 0.0–7.0)
Eosinophils Absolute: 0.3 10*3/uL (ref 0.0–0.5)
HEMATOCRIT: 40.1 % (ref 38.4–49.9)
HEMOGLOBIN: 13.4 g/dL (ref 13.0–17.1)
LYMPH#: 1.3 10*3/uL (ref 0.9–3.3)
LYMPH%: 32.3 % (ref 14.0–49.0)
MCH: 30 pg (ref 27.2–33.4)
MCHC: 33.4 g/dL (ref 32.0–36.0)
MCV: 89.7 fL (ref 79.3–98.0)
MONO#: 0.8 10*3/uL (ref 0.1–0.9)
MONO%: 20.1 % — ABNORMAL HIGH (ref 0.0–14.0)
NEUT#: 1.6 10*3/uL (ref 1.5–6.5)
NEUT%: 39.8 % (ref 39.0–75.0)
Platelets: 245 10*3/uL (ref 140–400)
RBC: 4.47 10*6/uL (ref 4.20–5.82)
RDW: 13.6 % (ref 11.0–14.6)
WBC: 4 10*3/uL (ref 4.0–10.3)

## 2015-08-17 LAB — LACTATE DEHYDROGENASE: LDH: 215 U/L (ref 125–245)

## 2015-08-17 MED ORDER — ACETAMINOPHEN 325 MG PO TABS
650.0000 mg | ORAL_TABLET | Freq: Once | ORAL | Status: AC
  Administered 2015-08-17: 650 mg via ORAL

## 2015-08-17 MED ORDER — SODIUM CHLORIDE 0.9 % IV SOLN
Freq: Once | INTRAVENOUS | Status: AC
  Administered 2015-08-17: 10:00:00 via INTRAVENOUS

## 2015-08-17 MED ORDER — DIPHENHYDRAMINE HCL 25 MG PO CAPS
ORAL_CAPSULE | ORAL | Status: AC
  Filled 2015-08-17: qty 2

## 2015-08-17 MED ORDER — DIPHENHYDRAMINE HCL 25 MG PO CAPS
50.0000 mg | ORAL_CAPSULE | Freq: Once | ORAL | Status: AC
  Administered 2015-08-17: 50 mg via ORAL

## 2015-08-17 MED ORDER — ACETAMINOPHEN 325 MG PO TABS
ORAL_TABLET | ORAL | Status: AC
  Filled 2015-08-17: qty 2

## 2015-08-17 MED ORDER — SODIUM CHLORIDE 0.9 % IV SOLN
375.0000 mg/m2 | Freq: Once | INTRAVENOUS | Status: AC
  Administered 2015-08-17: 700 mg via INTRAVENOUS
  Filled 2015-08-17: qty 60

## 2015-08-17 NOTE — Telephone Encounter (Signed)
per pof to sch appt-appt already sch-gave pt copy of avs

## 2015-08-17 NOTE — Patient Instructions (Signed)
[facility] Discharge Instructions for Patients Receiving Chemotherapy  Today you received the following chemotherapy agents: Rituxan   To help prevent nausea and vomiting after your treatment, we encourage you to take your nausea medication as directed.    If you develop nausea and vomiting that is not controlled by your nausea medication, call the clinic.   BELOW ARE SYMPTOMS THAT SHOULD BE REPORTED IMMEDIATELY:  *FEVER GREATER THAN 100.5 F  *CHILLS WITH OR WITHOUT FEVER  NAUSEA AND VOMITING THAT IS NOT CONTROLLED WITH YOUR NAUSEA MEDICATION  *UNUSUAL SHORTNESS OF BREATH  *UNUSUAL BRUISING OR BLEEDING  TENDERNESS IN MOUTH AND THROAT WITH OR WITHOUT PRESENCE OF ULCERS  *URINARY PROBLEMS  *BOWEL PROBLEMS  UNUSUAL RASH Items with * indicate a potential emergency and should be followed up as soon as possible.  Feel free to call the clinic you have any questions or concerns. The clinic phone number is [phone].  Please show the CHEMO ALERT CARD at check-in to the Emergency Department and triage nurse.   

## 2015-08-17 NOTE — Progress Notes (Signed)
[facility] Telephone:([phone]) [phone]   Fax:(336) [place], [place] [address]  PRINCIPAL DIAGNOSIS: Bulky stage IV low grade lymphoma diagnosed in November 2009.   PRIOR THERAPY:  1. Status post 7 cycles of systemic chemotherapy with CHOP/Rituxan. Last dose was given May 2010. 2. Status post 6 cycles of maintenance Rituxan 375 mg/sq m given every 2 months. Last dose was given on May 29, 2010, and the patient was lost to followup at that time. He received another dose on 12/11/2010, then again missed 2 doses. 3. systemic chemotherapy with Rituxan 375 mg/M2 on day 1 and that bendamustine 90 mg/M2 on days 1 and 2 every 4 weeks, status post 6 cycles. 4. Maintenance Rituxan 375 mg/M2 every 2 months, status post 12 cycles   CURRENT THERAPY: Rituxan 375 MG/M2 weekly for 4 weeks, last dose today, followed by maintenance treatment every 2 months. Status post 7 cycles.  INTERVAL HISTORY: Eric [name] 75 y.o. male returns to the clinic today for two-month followup visit. The patient is feeling fine today with no specific complaints. He has no significant change since his last visit 2 months ago. He has been tolerating his treatment with weekly Rituxan fairly well with no significant adverse effects. He denied having any significant chest pain, shortness of breath, cough or hemoptysis. He denied having any weight loss or night sweats. He has no fever or chills, no nausea or vomiting. He has no lymphadenopathy. He is here today for cycle #8 of the weekly Rituxan.  MEDICAL HISTORY: Past Medical History  Diagnosis Date  . History of heart attack   . Cancer ([place])   . Non Hodgkin's lymphoma ([place])   . Coronary atherosclerosis of native coronary artery   . Hyperlipidemia   . Myocardial infarction ([address])   . GERD (gastroesophageal reflux disease)   . Encounter for antineoplastic chemotherapy 06/20/2015     ALLERGIES:  is allergic to neosporin.  MEDICATIONS:  Current Outpatient Prescriptions  Medication Sig Dispense Refill  . aspirin EC 325 MG EC tablet Take 1 tablet (325 mg total) by mouth daily. 30 tablet   . atorvastatin (LIPITOR) 40 MG tablet TAKE 1 TABLET (40 MG TOTAL) BY MOUTH DAILY AT 6 PM. 90 tablet 0  . diphenhydrAMINE (BENADRYL) 25 MG tablet Take 25 mg by mouth at bedtime as needed. Sleep    . LORazepam (ATIVAN) 0.5 MG tablet Take 1 tablet (0.5 mg total) by mouth at bedtime as needed. for anxiety 30 tablet 0  . nitroGLYCERIN (NITROSTAT) 0.4 MG SL tablet Place 1 tablet (0.4 mg total) under the tongue every 5 (five) minutes x 3 doses as needed for chest pain. 25 tablet 3  . sildenafil (VIAGRA) 25 MG tablet Take 1 tablet (25 mg total) by mouth daily as needed. Take 2-3 tablets by mouth once daily as needed. 90 tablet 6   No current facility-administered medications for this visit.    SURGICAL HISTORY:  Past Surgical History  Procedure Laterality Date  . Coronary stent placement    . Left heart catheterization with coronary angiogram N/A 03/03/2012    Procedure: LEFT HEART CATHETERIZATION WITH CORONARY ANGIOGRAM;  Surgeon: [name], MD;  Location: [facility] CATH LAB;  Service: Cardiovascular;  Laterality: N/A;  . Percutaneous coronary stent intervention (pci-s) N/A 03/04/2012    Procedure: PERCUTANEOUS CORONARY STENT INTERVENTION (PCI-S);  Surgeon: [name], MD;  Location: [facility] C[facility] LAB;  Service: Cardiovascular;  Laterality: N/A;    REVIEW OF SYSTEMS:  A comprehensive review of systems was negative.   PHYSICAL EXAMINATION: General appearance: alert, cooperative and no distress Head: Normocephalic, without obvious abnormality, atraumatic Neck: no adenopathy Lymph nodes: Cervical, supraclavicular, and axillary nodes normal. Resp: clear to auscultation bilaterally Back: symmetric, no curvature. ROM normal. No CVA tenderness. Cardio: regular rate and rhythm, S1, S2 normal,  no murmur, click, rub or gallop GI: soft, non-tender; bowel sounds normal; no masses,  no organomegaly Extremities: extremities normal, atraumatic, no cyanosis or edema Neurologic: Alert and oriented X 3, normal strength and tone. Normal symmetric reflexes. Normal coordination and gait  ECOG PERFORMANCE STATUS: 0 - Asymptomatic  Blood pressure 129/83, pulse 75, temperature 98.3 F (36.8 C), temperature source Oral, resp. rate 18, height '5\' 7"'$  (1.702 m), weight 157 lb 8 oz (71.442 kg), SpO2 97 %.  LABORATORY DATA: Lab Results  Component Value Date   WBC 4.0 08/17/2015   HGB 13.4 08/17/2015   HCT 40.1 08/17/2015   MCV 89.7 08/17/2015   PLT 245 08/17/2015      Chemistry      Component Value Date/Time   NA 142 06/13/2015 0911   NA 139 01/05/2015 1440   NA 141 03/23/2010 1301   K 4.1 06/13/2015 0911   K 4.5 01/05/2015 1440   K 4.7 03/23/2010 1301   CL 102 01/05/2015 1440   CL 106 08/06/2012 1244   CL 99 03/23/2010 1301   CO2 27 06/13/2015 0911   CO2 25 01/05/2015 1440   CO2 26 03/23/2010 1301   BUN 14.0 06/13/2015 0911   BUN 15 01/05/2015 1440   BUN 12 03/23/2010 1301   CREATININE 1.1 06/13/2015 0911   CREATININE 1.07 01/05/2015 1440   CREATININE 0.92 03/05/2012 0545      Component Value Date/Time   CALCIUM 9.5 06/13/2015 0911   CALCIUM 9.5 01/05/2015 1440   CALCIUM 9.1 03/23/2010 1301   ALKPHOS 116 06/13/2015 0911   ALKPHOS 101 01/05/2015 1440   ALKPHOS 78 03/23/2010 1301   AST 25 06/13/2015 0911   AST 24 01/05/2015 1440   AST 25 03/23/2010 1301   ALT 20 06/13/2015 0911   ALT 17 01/05/2015 1440   ALT 20 03/23/2010 1301   BILITOT 0.53 06/13/2015 0911   BILITOT 0.7 01/05/2015 1440   BILITOT 0.60 03/23/2010 1301       RADIOGRAPHIC STUDIES: No results found. ASSESSMENT AND PLAN: This is a very pleasant 75 years old male with history of bulky stage IV low-grade lymphoma completed maintenance Rituxan status post 12 cycles. Has been observation but the recent CT  scan of the chest, abdomen and pelvis showed evidence for disease progression. The patient was started on weekly Rituxan status post 7 cycles. I recommended for him to proceed with the treatment with cycle #8 of Rituxan today as a scheduled. He would come back for follow-up visit in 2 months for reevaluation before starting cycle #9.  He was advised to call immediately if he has any concerning symptoms in the interval. The patient voices understanding of current disease status and treatment options and is in agreement with the current care plan.  All questions were answered. The patient knows to call the clinic with any problems, questions or concerns. We can certainly see the patient much sooner if necessary.  Disclaimer: This note was dictated with voice recognition software. Similar sounding words can inadvertently be transcribed and may not be corrected upon review.

## 2015-08-17 NOTE — Progress Notes (Signed)
Pt left ambulatory in no apparent distress prior to last set of vital signs. Pt tolerated rituxamab infusion without difficulty and had no complaints upon completion.

## 2015-08-17 NOTE — Addendum Note (Signed)
Addended by: Lucile Crater on: 08/17/2015 09:33 AM   Modules accepted: Medications

## 2015-09-27 ENCOUNTER — Emergency Department (HOSPITAL_COMMUNITY)
Admission: EM | Admit: 2015-09-27 | Discharge: 2015-09-28 | Disposition: A | Discharge status: Home / Self Care | Payer: Medicare Other | Attending: Emergency Medicine | Admitting: Emergency Medicine

## 2015-09-27 ENCOUNTER — Encounter (HOSPITAL_COMMUNITY): Payer: Self-pay | Admitting: Emergency Medicine

## 2015-09-27 DIAGNOSIS — R6883 Chills (without fever): Secondary | ICD-10-CM | POA: Diagnosis not present

## 2015-09-27 DIAGNOSIS — Z7982 Long term (current) use of aspirin: Secondary | ICD-10-CM | POA: Insufficient documentation

## 2015-09-27 DIAGNOSIS — I2510 Atherosclerotic heart disease of native coronary artery without angina pectoris: Secondary | ICD-10-CM | POA: Diagnosis not present

## 2015-09-27 DIAGNOSIS — Z8572 Personal history of non-Hodgkin lymphomas: Secondary | ICD-10-CM | POA: Insufficient documentation

## 2015-09-27 DIAGNOSIS — D489 Neoplasm of uncertain behavior, unspecified: Secondary | ICD-10-CM | POA: Diagnosis not present

## 2015-09-27 DIAGNOSIS — C801 Malignant (primary) neoplasm, unspecified: Secondary | ICD-10-CM | POA: Diagnosis not present

## 2015-09-27 DIAGNOSIS — D72825 Bandemia: Secondary | ICD-10-CM | POA: Insufficient documentation

## 2015-09-27 DIAGNOSIS — I252 Old myocardial infarction: Secondary | ICD-10-CM | POA: Insufficient documentation

## 2015-09-27 LAB — COMPREHENSIVE METABOLIC PANEL
ALBUMIN: 3.9 g/dL (ref 3.5–5.0)
ALK PHOS: 106 U/L (ref 38–126)
ALT: 19 U/L (ref 17–63)
AST: 28 U/L (ref 15–41)
Anion gap: 9 (ref 5–15)
BUN: 12 mg/dL (ref 6–20)
CALCIUM: 9.4 mg/dL (ref 8.9–10.3)
CO2: 26 mmol/L (ref 22–32)
CREATININE: 1.46 mg/dL — AB (ref 0.61–1.24)
Chloride: 105 mmol/L (ref 101–111)
GFR calc Af Amer: 53 mL/min — ABNORMAL LOW (ref >60)
GFR calc non Af Amer: 46 mL/min — ABNORMAL LOW (ref >60)
GLUCOSE: 96 mg/dL (ref 65–99)
Potassium: 4.5 mmol/L (ref 3.5–5.1)
Sodium: 140 mmol/L (ref 135–145)
TOTAL PROTEIN: 6.2 g/dL — AB (ref 6.5–8.1)
Total Bilirubin: 0.9 mg/dL (ref 0.3–1.2)

## 2015-09-27 LAB — CBC WITH DIFFERENTIAL/PLATELET
BAND NEUTROPHILS: 0 %
BASOS PCT: 1 %
BLASTS: 0 %
Basophils Absolute: 0 10*3/uL (ref 0.0–0.1)
EOS ABS: 0 10*3/uL (ref 0.0–0.7)
Eosinophils Relative: 0 %
HEMATOCRIT: 41.7 % (ref 39.0–52.0)
HEMOGLOBIN: 13.8 g/dL (ref 13.0–17.0)
LYMPHS PCT: 13 %
Lymphs Abs: 0.5 10*3/uL — ABNORMAL LOW (ref 0.7–4.0)
MCH: 29.9 pg (ref 26.0–34.0)
MCHC: 33.1 g/dL (ref 30.0–36.0)
MCV: 90.3 fL (ref 78.0–100.0)
MONO ABS: 0.3 10*3/uL (ref 0.1–1.0)
MYELOCYTES: 0 %
Metamyelocytes Relative: 0 %
Monocytes Relative: 9 %
NEUTROS PCT: 77 %
Neutro Abs: 2.8 10*3/uL (ref 1.7–7.7)
OTHER: 0 %
PROMYELOCYTES ABS: 0 %
Platelets: 234 10*3/uL (ref 150–400)
RBC: 4.62 MIL/uL (ref 4.22–5.81)
RDW: 13.5 % (ref 11.5–15.5)
WBC: 3.6 10*3/uL — ABNORMAL LOW (ref 4.0–10.5)
nRBC: 0 /100 WBC

## 2015-09-27 LAB — URINE MICROSCOPIC-ADD ON: RBC / HPF: NONE SEEN RBC/hpf (ref 0–5)

## 2015-09-27 LAB — URINALYSIS, ROUTINE W REFLEX MICROSCOPIC
GLUCOSE, UA: NEGATIVE mg/dL
HGB URINE DIPSTICK: NEGATIVE
KETONES UR: 40 mg/dL — AB
Nitrite: NEGATIVE
PH: 5 (ref 5.0–8.0)
Protein, ur: NEGATIVE mg/dL
Specific Gravity, Urine: 1.02 (ref 1.005–1.030)

## 2015-09-27 NOTE — ED Triage Notes (Signed)
Pt. reports chills and " shaking " this afternoon , denies fever , alert and oriented /respirations unlabored.

## 2015-09-28 ENCOUNTER — Emergency Department (HOSPITAL_COMMUNITY): Payer: Medicare Other

## 2015-09-28 LAB — I-STAT CG4 LACTIC ACID, ED: Lactic Acid, Venous: 1.75 mmol/L (ref 0.5–1.9)

## 2015-09-28 NOTE — Discharge Instructions (Signed)
Return to the ER immediately if the symptoms get worse. See you cancer team as soon as possible for repeat blood work.

## 2015-09-28 NOTE — ED Notes (Signed)
Patient Alert and oriented X4. Stable and ambulatory. Patient verbalized understanding of the discharge instructions.  Patient belongings were taken by the patient.  

## 2015-09-28 NOTE — ED Provider Notes (Signed)
[facility] DEPT Provider Note   CSN: [id number] Arrival date & time: 09/27/15  2045  First Provider Contact:  First MD Initiated Contact with Patient 09/28/15 0002     By signing my name below, I, Altamease Oiler, attest that this documentation has been prepared under the direction and in the presence of Varney Biles, MD. Electronically Signed: Altamease Oiler, ED Scribe. 09/28/15. 2:06 AM   History   Chief Complaint Chief Complaint  Patient presents with  . Chills    HPI  The history is provided by the patient. No language interpreter was used.   Eric [name] is a 75 y.o. male with PMHx of non-Hodgkin's lymphoma on chemotherapy  who presents to the Emergency Department complaining of an hour long episode of chills last afternoon. Pt states he was in a heavily air-conditioned room and he started to shake heavily. Associated symptoms include abdominal pain yesterday and earlier today that seems to have resolved. Pt denies fever, headache, chest pain, cough, SOB, N/V/D, dysuria, and hematuria. He also denies rash but notes an area of scaling at his left ankle after that has been present over the last 3-4 weeks since he treated an abrasion with betadine. His last chemotherapy treatment was approximately 2 months ago. He denies any other recent medical or dental procedures.   Past Medical History:  Diagnosis Date  . Cancer ([place])   . Coronary atherosclerosis of native coronary artery   . Encounter for antineoplastic chemotherapy 06/20/2015  . GERD (gastroesophageal reflux disease)   . History of heart attack   . Hyperlipidemia   . Myocardial infarction ([place])   . Non Hodgkin's lymphoma [facility])     Patient Active Problem List   Diagnosis Date Noted  . Encounter for antineoplastic chemotherapy 06/20/2015  . Grade 1 follicular lymphoma of lymph nodes of multiple regions ([place]) 02/16/2015  . CAD in native artery 08/18/2014  . Hearing loss 07/11/2012  . Insomnia, idiopathic  07/11/2012  . Erectile dysfunction 07/11/2012  . Non-STEMI (non-ST elevated myocardial infarction) ([place]) 03/06/2012  . Antithrombotic drugs (platelet-aggregation inhibitors) causing adverse effect in therapeutic use 03/05/2012    Class: Acute  . Chest pain at rest 03/02/2012    Class: Acute  . Lymphoma ([place]) 06/05/2011    Class: Chronic    Past Surgical History:  Procedure Laterality Date  . CORONARY STENT PLACEMENT    . LEFT HEART CATHETERIZATION WITH CORONARY ANGIOGRAM N/A 03/03/2012   Procedure: LEFT HEART CATHETERIZATION WITH CORONARY ANGIOGRAM;  Surgeon: [name], MD;  Location: [facility] CATH LAB;  Service: Cardiovascular;  Laterality: N/A;  . PERCUTANEOUS CORONARY STENT INTERVENTION (PCI-S) N/A 03/04/2012   Procedure: PERCUTANEOUS CORONARY STENT INTERVENTION (PCI-S);  Surgeon: [name], MD;  Location[facility] CATH LAB;  Service: Cardiovascular;  Laterality: N/A;       Home Medications    Prior to Admission medications   Medication Sig Start Date End Date Taking? Authorizing Provider  aspirin EC 81 MG tablet Take 81 mg by mouth at bedtime.   Yes Historical Provider, MD  diphenhydrAMINE (BENADRYL) 25 MG tablet Take 25 mg by mouth at bedtime as needed for sleep.    Yes Historical Provider, MD  LORazepam (ATIVAN) 0.5 MG tablet Take 1 tablet (0.5 mg total) by mouth at bedtime as needed. for anxiety 08/02/15  Yes Jaynee Eagles, PA-C  nitroGLYCERIN (NITROSTAT) 0.4 MG SL tablet Place 1 tablet (0.4 mg total) under the tongue every 5 (five) minutes x 3 doses as needed for chest pain. 01/05/15  Yes Jaynee Eagles, PA-C  omeprazole (PRILOSEC) 20 MG capsule Take 20 mg by mouth daily as needed (acid relfux).   Yes Historical Provider, MD  sildenafil (VIAGRA) 25 MG tablet Take 1 tablet (25 mg total) by mouth daily as needed. Take 2-3 tablets by mouth once daily as needed. Patient taking differently: Take 25 mg by mouth daily as needed for erectile dysfunction. Take 2-3 tablets by mouth once daily as  needed. 01/05/15  Yes Jaynee Eagles, PA-C    Family History Family History  Problem Relation Age of Onset  . Heart disease Father   . Leukemia Mother     Social History Social History  Substance Use Topics  . Smoking status: Never Smoker  . Smokeless tobacco: Never Used  . Alcohol use Yes     Comment: occasionally     Allergies   Neosporin [neomycin-bacitracin zn-polymyx]   Review of Systems Review of Systems 10 Systems reviewed and all are negative for acute change except as noted in the HPI.  Physical Exam Updated Vital Signs BP 96/71 (BP Location: Right Arm)   Pulse 118   Temp 99 F (37.2 C) (Oral)   Resp 18   SpO2 97%   Physical Exam  Constitutional: He is oriented to person, place, and time. He appears well-developed and well-nourished.  HENT:  Head: Normocephalic and atraumatic.  Eyes: EOM are normal.  Neck: Normal range of motion.  Cardiovascular: Normal rate, regular rhythm, normal heart sounds and intact distal pulses.   Pulmonary/Chest: Effort normal and breath sounds normal. No respiratory distress.  CTAB  Abdominal: Soft. He exhibits no distension. There is no tenderness.  Abdomen is soft without distension or focal tenderness   Musculoskeletal: Normal range of motion.  Left ankle, medially, has 8X6 cm area of skin hyperpigmentation and scaling. No TTP. Ankle ROM is normal   Neurological: He is alert and oriented to person, place, and time.  Skin: Skin is warm and dry.  Psychiatric: He has a normal mood and affect. Judgment normal.  Nursing note and vitals reviewed.    ED Treatments / Results  Labs (all labs ordered are listed, but only abnormal results are displayed) Labs Reviewed  CBC WITH DIFFERENTIAL/PLATELET - Abnormal; Notable for the following:       Result Value   WBC 3.6 (*)    Lymphs Abs 0.5 (*)    All other components within normal limits  COMPREHENSIVE METABOLIC PANEL - Abnormal; Notable for the following:    Creatinine, Ser 1.46  (*)    Total Protein 6.2 (*)    GFR calc non Af Amer 46 (*)    GFR calc Af Amer 53 (*)    All other components within normal limits  URINALYSIS, ROUTINE W REFLEX MICROSCOPIC (NOT AT [facility]) - Abnormal; Notable for the following:    Color, Urine AMBER (*)    APPearance CLOUDY (*)    Bilirubin Urine SMALL (*)    Ketones, ur 40 (*)    Leukocytes, UA TRACE (*)    All other components within normal limits  URINE MICROSCOPIC-ADD ON - Abnormal; Notable for the following:    Squamous Epithelial / LPF 0-5 (*)    Bacteria, UA RARE (*)    Casts HYALINE CASTS (*)    All other components within normal limits  CULTURE, BLOOD (ROUTINE X 2)  CULTURE, BLOOD (ROUTINE X 2)  URINE CULTURE  I-STAT CG4 LACTIC ACID, ED    EKG  EKG Interpretation None  Radiology No results found.  Procedures Procedures (including critical care time)  Medications Ordered in ED Medications - No data to display   Initial Impression / Assessment and Plan / ED Course  I have reviewed the triage vital signs and the nursing notes.  COORDINATION OF CARE: 12:34 AM Discussed treatment plan which includes lab work with pt at bedside and pt agreed to plan.  Pertinent lab results that were available during my care of the patient were reviewed by me and considered in my medical decision making (see chart for details).  Clinical Course    I personally performed the services described in this documentation, which was scribed in my presence. The recorded information has been reviewed and is accurate.  Pt comes in with cc of chills. He has hx of lymphoma, last chemo was in June 8th. Pt reports episode of shaking chills earlier today. The chills have since resolved and lasted for an hour. He has no fevers here. Denies any fevers at home. Pt hasnt taken any meds prior to coming to the ER. Head to toe evaluation on exam and ROS is neg for any source of infection. Labs show bandemia and slight leukopenia. Pt is not  neutropenic. Concerns for bacteremia vs. Cancer related abnormality. Results discussed with the patient. We informed him that we would recommend admitting him in the hospital as obs, allow the cultures to return, and d/c if OK. He asked for alternatives, since he feels great and has to work tomorrow. We discussed the alternative of going home, and returning to the ER if the symptoms get worse or cultures grow bacteria. PT is a pharmacist, and understands the findings well and understands the risk of possible delayed care if he goes home and poor outcome due to that. Pt given extra time to rethink - and he informed me that he would like to go home. We will send an email to the cancer team to see if they can f/u soon.  Final Clinical Impressions(s) / ED Diagnoses   Final diagnoses:  Bandemia  Chills  Cancer [facility])    New Prescriptions New Prescriptions   No medications on file     Varney Biles, MD 09/28/15 0206

## 2015-09-29 LAB — URINE CULTURE: CULTURE: NO GROWTH

## 2015-10-03 LAB — CULTURE, BLOOD (ROUTINE X 2)
CULTURE: NO GROWTH
Culture: NO GROWTH

## 2015-10-12 ENCOUNTER — Telehealth: Payer: Self-pay | Admitting: Internal Medicine

## 2015-10-12 ENCOUNTER — Ambulatory Visit (HOSPITAL_BASED_OUTPATIENT_CLINIC_OR_DEPARTMENT_OTHER): Payer: Medicare Other

## 2015-10-12 ENCOUNTER — Ambulatory Visit (HOSPITAL_BASED_OUTPATIENT_CLINIC_OR_DEPARTMENT_OTHER): Payer: Medicare Other | Admitting: Internal Medicine

## 2015-10-12 ENCOUNTER — Encounter: Payer: Self-pay | Admitting: Internal Medicine

## 2015-10-12 ENCOUNTER — Other Ambulatory Visit (HOSPITAL_BASED_OUTPATIENT_CLINIC_OR_DEPARTMENT_OTHER): Payer: Medicare Other

## 2015-10-12 VITALS — BP 117/72 | HR 70 | Temp 98.2°F | Resp 17 | Ht 67.0 in | Wt 156.8 lb

## 2015-10-12 VITALS — BP 106/71 | HR 69 | Temp 97.8°F | Resp 17

## 2015-10-12 DIAGNOSIS — Z5111 Encounter for antineoplastic chemotherapy: Secondary | ICD-10-CM

## 2015-10-12 DIAGNOSIS — C8590 Non-Hodgkin lymphoma, unspecified, unspecified site: Secondary | ICD-10-CM

## 2015-10-12 DIAGNOSIS — C8208 Follicular lymphoma grade I, lymph nodes of multiple sites: Secondary | ICD-10-CM | POA: Diagnosis not present

## 2015-10-12 DIAGNOSIS — Z5112 Encounter for antineoplastic immunotherapy: Secondary | ICD-10-CM | POA: Diagnosis not present

## 2015-10-12 LAB — CBC WITH DIFFERENTIAL/PLATELET
BASO%: 0.8 % (ref 0.0–2.0)
Basophils Absolute: 0 10*3/uL (ref 0.0–0.1)
EOS ABS: 0.3 10*3/uL (ref 0.0–0.5)
EOS%: 8 % — ABNORMAL HIGH (ref 0.0–7.0)
HEMATOCRIT: 39.9 % (ref 38.4–49.9)
HGB: 13.6 g/dL (ref 13.0–17.1)
LYMPH%: 31.3 % (ref 14.0–49.0)
MCH: 29.7 pg (ref 27.2–33.4)
MCHC: 34.1 g/dL (ref 32.0–36.0)
MCV: 87.1 fL (ref 79.3–98.0)
MONO#: 0.6 10*3/uL (ref 0.1–0.9)
MONO%: 14.8 % — ABNORMAL HIGH (ref 0.0–14.0)
NEUT%: 45.1 % (ref 39.0–75.0)
NEUTROS ABS: 1.8 10*3/uL (ref 1.5–6.5)
NRBC: 0 % (ref 0–0)
PLATELETS: 292 10*3/uL (ref 140–400)
RBC: 4.58 10*6/uL (ref 4.20–5.82)
RDW: 13.6 % (ref 11.0–14.6)
WBC: 4 10*3/uL (ref 4.0–10.3)
lymph#: 1.3 10*3/uL (ref 0.9–3.3)

## 2015-10-12 LAB — COMPREHENSIVE METABOLIC PANEL
ALBUMIN: 3.7 g/dL (ref 3.5–5.0)
ALK PHOS: 114 U/L (ref 40–150)
ALT: 13 U/L (ref 0–55)
ANION GAP: 8 meq/L (ref 3–11)
AST: 20 U/L (ref 5–34)
BUN: 19.3 mg/dL (ref 7.0–26.0)
CALCIUM: 9.3 mg/dL (ref 8.4–10.4)
CO2: 24 mEq/L (ref 22–29)
Chloride: 106 mEq/L (ref 98–109)
Creatinine: 1.1 mg/dL (ref 0.7–1.3)
EGFR: 64 mL/min/{1.73_m2} — ABNORMAL LOW (ref >90)
GLUCOSE: 85 mg/dL (ref 70–140)
Potassium: 4.6 mEq/L (ref 3.5–5.1)
Sodium: 138 mEq/L (ref 136–145)
TOTAL PROTEIN: 6.3 g/dL — AB (ref 6.4–8.3)
Total Bilirubin: 0.49 mg/dL (ref 0.20–1.20)

## 2015-10-12 LAB — LACTATE DEHYDROGENASE: LDH: 198 U/L (ref 125–245)

## 2015-10-12 MED ORDER — DIPHENHYDRAMINE HCL 25 MG PO CAPS
ORAL_CAPSULE | ORAL | Status: AC
  Filled 2015-10-12: qty 2

## 2015-10-12 MED ORDER — SODIUM CHLORIDE 0.9 % IV SOLN
375.0000 mg/m2 | Freq: Once | INTRAVENOUS | Status: AC
  Administered 2015-10-12: 700 mg via INTRAVENOUS
  Filled 2015-10-12: qty 50

## 2015-10-12 MED ORDER — DIPHENHYDRAMINE HCL 25 MG PO CAPS
50.0000 mg | ORAL_CAPSULE | Freq: Once | ORAL | Status: AC
  Administered 2015-10-12: 50 mg via ORAL

## 2015-10-12 MED ORDER — ACETAMINOPHEN 325 MG PO TABS
650.0000 mg | ORAL_TABLET | Freq: Once | ORAL | Status: AC
  Administered 2015-10-12: 650 mg via ORAL

## 2015-10-12 MED ORDER — SODIUM CHLORIDE 0.9 % IV SOLN
Freq: Once | INTRAVENOUS | Status: AC
  Administered 2015-10-12: 11:00:00 via INTRAVENOUS

## 2015-10-12 MED ORDER — ACETAMINOPHEN 325 MG PO TABS
ORAL_TABLET | ORAL | Status: AC
  Filled 2015-10-12: qty 2

## 2015-10-12 NOTE — Telephone Encounter (Signed)
per pof to sch pt appt-gave pt copy of avs °

## 2015-10-12 NOTE — Progress Notes (Signed)
[facility] [phone]   Fax:(336) [place], [place] [address]  PRINCIPAL DIAGNOSIS: Bulky stage IV low grade lymphoma diagnosed in November 2009.   PRIOR THERAPY:  1. Status post 7 cycles of systemic chemotherapy with CHOP/Rituxan. Last dose was given May 2010. 2. Status post 6 cycles of maintenance Rituxan 375 mg/sq m given every 2 months. Last dose was given on May 29, 2010, and the patient was lost to followup at that time. He received another dose on 12/11/2010, then again missed 2 doses. 3. systemic chemotherapy with Rituxan 375 mg/M2 on day 1 and that bendamustine 90 mg/M2 on days 1 and 2 every 4 weeks, status post 6 cycles. 4. Maintenance Rituxan 375 mg/M2 every 2 months, status post 12 cycles   CURRENT THERAPY: Rituxan 375 MG/M2 weekly for 4 weeks, last dose today, followed by maintenance treatment every 2 months. Status post 8 cycles.  INTERVAL HISTORY: Eric [name] 75 y.o. male returns to the clinic today for two-month followup visit. The patient is feeling fine today with no specific complaints. He is tolerating his treatment with weekly Rituxan fairly well with no significant adverse effects. He denied having any significant chest pain, shortness of breath, cough or hemoptysis. He denied having any weight loss or night sweats. He has no fever or chills, no nausea or vomiting. He has no lymphadenopathy. He is here today for cycle #9 of the weekly Rituxan.  MEDICAL HISTORY: Past Medical History:  Diagnosis Date  . Cancer ([place])   . Coronary atherosclerosis of native coronary artery   . Encounter for antineoplastic chemotherapy 06/20/2015  . GERD (gastroesophageal reflux disease)   . History of heart attack   . Hyperlipidemia   . Myocardial infarction ([place])   . Non Hodgkin's lymphoma ([place])     ALLERGIES:  is allergic to neosporin [neomycin-bacitracin  zn-polymyx].  MEDICATIONS:  Current Outpatient Prescriptions  Medication Sig Dispense Refill  . aspirin EC 81 MG tablet Take 81 mg by mouth at bedtime.    . diphenhydrAMINE (BENADRYL) 25 MG tablet Take 25 mg by mouth at bedtime as needed for sleep.     Marland Kitchen LORazepam (ATIVAN) 0.5 MG tablet Take 1 tablet (0.5 mg total) by mouth at bedtime as needed. for anxiety 30 tablet 0  . nitroGLYCERIN (NITROSTAT) 0.4 MG SL tablet Place 1 tablet (0.4 mg total) under the tongue every 5 (five) minutes x 3 doses as needed for chest pain. 25 tablet 3  . omeprazole (PRILOSEC) 20 MG capsule Take 20 mg by mouth daily as needed (acid relfux).    . sildenafil (VIAGRA) 25 MG tablet Take 1 tablet (25 mg total) by mouth daily as needed. Take 2-3 tablets by mouth once daily as needed. (Patient taking differently: Take 25 mg by mouth daily as needed for erectile dysfunction. Take 2-3 tablets by mouth once daily as needed.) 90 tablet 6   No current facility-administered medications for this visit.     SURGICAL HISTORY:  Past Surgical History:  Procedure Laterality Date  . CORONARY STENT PLACEMENT    . LEFT HEART CATHETERIZATION WITH CORONARY ANGIOGRAM N/A 03/03/2012   Procedure: LEFT HEART CATHETERIZATION WITH CORONARY ANGIOGRAM;  Surgeon: [name], MD;  Location: [facility] CATH LAB;  Service: Cardiovascular;  Laterality: N/A;  . PERCUTANEOUS CORONARY STENT INTERVENTION (PCI-S) N/A 03/04/2012   Procedure: PERCUTANEOUS CORONARY STENT INTERVENTION (PCI-S);  Surgeon: [name], MD;  Location:  [facility] CATH LAB;  Service: Cardiovascular;  Laterality: N/A;    REVIEW OF SYSTEMS:  A comprehensive review of systems was negative.   PHYSICAL EXAMINATION: General appearance: alert, cooperative and no distress Head: Normocephalic, without obvious abnormality, atraumatic Neck: no adenopathy Lymph nodes: Cervical, supraclavicular, and axillary nodes normal. Resp: clear to auscultation bilaterally Back: symmetric, no curvature. ROM  normal. No CVA tenderness. Cardio: regular rate and rhythm, S1, S2 normal, no murmur, click, rub or gallop GI: soft, non-tender; bowel sounds normal; no masses,  no organomegaly Extremities: extremities normal, atraumatic, no cyanosis or edema Neurologic: Alert and oriented X 3, normal strength and tone. Normal symmetric reflexes. Normal coordination and gait  ECOG PERFORMANCE STATUS: 0 - Asymptomatic  Blood pressure 117/72, pulse 70, temperature 98.2 F (36.8 C), temperature source Oral, resp. rate 17, height '5\' 7"'$  (1.702 m), weight 156 lb 12.8 oz (71.1 kg), SpO2 100 %.  LABORATORY DATA: Lab Results  Component Value Date   WBC 4.0 10/12/2015   HGB 13.6 10/12/2015   HCT 39.9 10/12/2015   MCV 87.1 10/12/2015   PLT 292 10/12/2015      Chemistry      Component Value Date/Time   NA 140 09/27/2015 2106   NA 138 08/17/2015 0827   K 4.5 09/27/2015 2106   K 4.3 08/17/2015 0827   CL 105 09/27/2015 2106   CL 106 08/06/2012 1244   CO2 26 09/27/2015 2106   CO2 23 08/17/2015 0827   BUN 12 09/27/2015 2106   BUN 15.7 08/17/2015 0827   CREATININE 1.46 (H) 09/27/2015 2106   CREATININE 1.0 08/17/2015 0827      Component Value Date/Time   CALCIUM 9.4 09/27/2015 2106   CALCIUM 9.0 08/17/2015 0827   ALKPHOS 106 09/27/2015 2106   ALKPHOS 106 08/17/2015 0827   AST 28 09/27/2015 2106   AST 22 08/17/2015 0827   ALT 19 09/27/2015 2106   ALT 13 08/17/2015 0827   BILITOT 0.9 09/27/2015 2106   BILITOT 0.50 08/17/2015 0827       RADIOGRAPHIC STUDIES: No results found. ASSESSMENT AND PLAN: This is a very pleasant 75 years old male with history of bulky stage IV low-grade lymphoma completed maintenance Rituxan status post 12 cycles. Has been observation but the recent CT scan of the chest, abdomen and pelvis showed evidence for disease progression. The patient was started on weekly Rituxan status post 8 cycles. I recommended for him to proceed with the treatment with cycle #9 of Rituxan  today as a scheduled. He would come back for follow-up visit in 2 months for reevaluation before starting cycle #10 after repeating CT scan of the chest, abdomen and pelvis for restaging of his disease.  He was advised to call immediately if he has any concerning symptoms in the interval. The patient voices understanding of current disease status and treatment options and is in agreement with the current care plan.  All questions were answered. The patient knows to call the clinic with any problems, questions or concerns. We can certainly see the patient much sooner if necessary.  Disclaimer: This note was dictated with voice recognition software. Similar sounding words can inadvertently be transcribed and may not be corrected upon review.

## 2015-10-12 NOTE — Patient Instructions (Signed)
[facility] Discharge Instructions for Patients Receiving Chemotherapy  Today you received the following chemotherapy agents: Rituxan   To help prevent nausea and vomiting after your treatment, we encourage you to take your nausea medication as directed.    If you develop nausea and vomiting that is not controlled by your nausea medication, call the clinic.   BELOW ARE SYMPTOMS THAT SHOULD BE REPORTED IMMEDIATELY:  *FEVER GREATER THAN 100.5 F  *CHILLS WITH OR WITHOUT FEVER  NAUSEA AND VOMITING THAT IS NOT CONTROLLED WITH YOUR NAUSEA MEDICATION  *UNUSUAL SHORTNESS OF BREATH  *UNUSUAL BRUISING OR BLEEDING  TENDERNESS IN MOUTH AND THROAT WITH OR WITHOUT PRESENCE OF ULCERS  *URINARY PROBLEMS  *BOWEL PROBLEMS  UNUSUAL RASH Items with * indicate a potential emergency and should be followed up as soon as possible.  Feel free to call the clinic you have any questions or concerns. The clinic phone number is [phone].  Please show the CHEMO ALERT CARD at check-in to the Emergency Department and triage nurse.   

## 2015-11-24 ENCOUNTER — Ambulatory Visit (INDEPENDENT_AMBULATORY_CARE_PROVIDER_SITE_OTHER): Payer: Medicare Other | Admitting: Physician Assistant

## 2015-11-24 VITALS — BP 126/78 | HR 90 | Temp 98.1°F | Resp 17 | Ht 67.0 in | Wt 160.0 lb

## 2015-11-24 DIAGNOSIS — J019 Acute sinusitis, unspecified: Secondary | ICD-10-CM

## 2015-11-24 DIAGNOSIS — G4700 Insomnia, unspecified: Secondary | ICD-10-CM | POA: Diagnosis not present

## 2015-11-24 DIAGNOSIS — J309 Allergic rhinitis, unspecified: Secondary | ICD-10-CM

## 2015-11-24 MED ORDER — AMOXICILLIN 500 MG PO CAPS: 500.0000 mg | ORAL_CAPSULE | Freq: Two times a day (BID) | ORAL | 0 refills | Status: AC

## 2015-11-24 MED ORDER — GUAIFENESIN ER 1200 MG PO TB12
1.0000 | ORAL_TABLET | Freq: Two times a day (BID) | ORAL | 1 refills | Status: DC | PRN
Start: 2015-11-24 — End: 2016-05-21

## 2015-11-24 MED ORDER — IPRATROPIUM BROMIDE 0.03 % NA SOLN: 2.0000 | Freq: Two times a day (BID) | NASAL | 0 refills | Status: DC

## 2015-11-24 MED ORDER — LORAZEPAM 0.5 MG PO TABS: 0.5000 mg | ORAL_TABLET | Freq: Every evening | ORAL | 0 refills | Status: DC | PRN

## 2015-11-24 NOTE — Progress Notes (Signed)
[facility] [address] [place] [address] [phone]  By signing my name below, I, Mesha Guinyard, attest that this documentation has been prepared under the direction and in the presence of Ivar Drape, [place]  Electronically Signed: Verlee Monte, Medical Scribe. 11/24/15. 4:35 PM.  Date:  11/24/2015   Name:  Eric [name]   DOB:  Aug 15, 1940   MRN:  [id number]  PCP:  Kennon Portela, MD   Chief Complaint  Patient presents with   Sinus Problem    x3-4weeks    History of Present Illness:  Eric [name] is a 75 y.o. male patient who presents to [facility] complaining of sinus drainage onset 2-3 weeks ago. Pt reports observing a clear fluid, yellow, or green color sputum when he blows his nose. Pt reports some sneezing, occasional cough, rhinorrhea, and right ear pain. Pt wears hearing aids and suspects his ear pain is due to his hearing aids being pushed in too far in.  Pt has taken Sudafed, and Claritin once without relief to his symptoms. Pt reports having sinus problems when it gets cold and states he works under a cold air vent at work. Pt states she may have some mold in his house. Pt denies fever, sinus congestion, sinus pain, chills, watery eyes, and seasonal allergies.  Sleep: Pt is having trouble sleeping. Pt used Ativan in the past for relief.This is been helpful for him. He has also attempted and be in. This worked however he is concerned that this may affect his memory. He feels similarly to trazodone and side effects.  Patient Active Problem List   Diagnosis Date Noted   Encounter for antineoplastic chemotherapy 16/96/7893   Grade 1 follicular lymphoma of lymph nodes of multiple regions ([place]) 02/16/2015   CAD in native artery 08/18/2014   Hearing loss 07/11/2012   Insomnia, idiopathic 07/11/2012   Erectile dysfunction 07/11/2012   Non-STEMI (non-ST elevated myocardial infarction) ([place]) 03/06/2012   Antithrombotic drugs  (platelet-aggregation inhibitors) causing adverse effect in therapeutic use 03/05/2012    Class: Acute   Chest pain at rest 03/02/2012    Class: Acute   Lymphoma ([place]) 06/05/2011    Class: Chronic    Past Medical History:  Diagnosis Date   Cancer ([place])    Coronary atherosclerosis of native coronary artery    Encounter for antineoplastic chemotherapy 06/20/2015   GERD (gastroesophageal reflux disease)    History of heart attack    Hyperlipidemia    Myocardial infarction ([place])    Non Hodgkin's lymphoma ([place])     Past Surgical History:  Procedure Laterality Date   CORONARY STENT PLACEMENT     LEFT HEART CATHETERIZATION WITH CORONARY ANGIOGRAM N/A 03/03/2012   Procedure: LEFT HEART CATHETERIZATION WITH CORONARY ANGIOGRAM;  Surgeon: [name], MD;  Location: [facility] CATH LAB;  Service: Cardiovascular;  Laterality: N/A;   PERCUTANEOUS CORONARY STENT INTERVENTION (PCI-S) N/A 03/04/2012   Procedure: PERCUTANEOUS CORONARY STENT INTERVENTION (PCI-S);  Surgeon: [name], MD;  Location: [facility] CATH LAB;  Service: Cardiovascular;  Laterality: N/A;    Social History  Substance Use Topics   Smoking status: Never Smoker   Smokeless tobacco: Never Used   Alcohol use Yes     Comment: occasionally    Family History  Problem Relation Age of Onset   Heart disease Father    Leukemia Mother     Allergies  Allergen Reactions   Neosporin [Neomycin-Bacitracin Zn-Polymyx] Anaphylaxis    Medication list has been reviewed and  updated.  Current Outpatient Prescriptions on File Prior to Visit  Medication Sig Dispense Refill   aspirin EC 81 MG tablet Take 81 mg by mouth at bedtime.     diphenhydrAMINE (BENADRYL) 25 MG tablet Take 25 mg by mouth at bedtime as needed for sleep.      omeprazole (PRILOSEC) 20 MG capsule Take 20 mg by mouth daily as needed (acid relfux).     LORazepam (ATIVAN) 0.5 MG tablet Take 1 tablet (0.5 mg total) by mouth at bedtime as needed. for  anxiety (Patient not taking: Reported on 11/24/2015) 30 tablet 0   nitroGLYCERIN (NITROSTAT) 0.4 MG SL tablet Place 1 tablet (0.4 mg total) under the tongue every 5 (five) minutes x 3 doses as needed for chest pain. (Patient not taking: Reported on 11/24/2015) 25 tablet 3   sildenafil (VIAGRA) 25 MG tablet Take 1 tablet (25 mg total) by mouth daily as needed. Take 2-3 tablets by mouth once daily as needed. (Patient not taking: Reported on 11/24/2015) 90 tablet 6   No current facility-administered medications on file prior to visit.     Review of Systems  Constitutional: Negative for chills and fever.  HENT: Positive for ear pain. Negative for congestion.        Positive for rhinorrhea Positive for sneezing  Eyes: Negative for discharge.  Respiratory: Positive for cough.   Endo/Heme/Allergies: Negative for environmental allergies.  Psychiatric/Behavioral: The patient has insomnia.    Physical Examination: BP 126/78 (BP Location: Right Arm, Patient Position: Sitting, Cuff Size: Large)    Pulse 90    Temp 98.1 F (36.7 C) (Oral)    Resp 17    Ht '5\' 7"'$  (1.702 m)    Wt 160 lb (72.6 kg)    SpO2 97%    BMI 25.06 kg/m  Ideal Body Weight: '@FLOWAMB'$ ([id number])@  Physical Exam  Constitutional: He is oriented to person, place, and time. He appears well-developed and well-nourished. No distress.  HENT:  Head: Normocephalic and atraumatic.  Right Ear: Tympanic membrane and ear canal normal.  Left Ear: Tympanic membrane and ear canal normal.  Nose: Nose normal.  Mouth/Throat: Oropharynx is clear and moist.  Eyes: Conjunctivae and EOM are normal. Pupils are equal, round, and reactive to light.  Cardiovascular: Normal rate, regular rhythm and normal heart sounds.  Exam reveals no friction rub.   No murmur heard. Pulmonary/Chest: Effort normal and breath sounds normal. No respiratory distress. He has no wheezes. He has no rales.  Neurological: He is alert and oriented to person, place, and time.    Skin: Skin is warm and dry. He is not diaphoretic.  Psychiatric: He has a normal mood and affect. His behavior is normal.   Assessment and Plan: Eric [name] is a 75 y.o. male who is here today for cc of sinus issues. Sinus symptoms with etiology unknown. I have advised him to use the allergy medications at this time along with a nasal spray. He will take the amoxicillin if his symptoms do not improve. Also advised him to use a purifier in his home particularly his bedroom. And he can also use a nasal rinse to see if this will improve. I will refill the ativan. Allergic rhinitis, unspecified allergic rhinitis type - Plan: Guaifenesin (MUCINEX MAXIMUM STRENGTH) 1200 MG TB12, ipratropium (ATROVENT) 0.03 % nasal spray, amoxicillin (AMOXIL) 500 MG capsule  Acute sinusitis, recurrence not specified, unspecified location  Insomnia - Plan: LORazepam (ATIVAN) 0.5 MG tablet  Ivar Drape, PA-C [facility]  [place] 9/23/201712:01 PM  I personally performed the services described in this documentation, which was scribed in my presence. The recorded information has been reviewed and is accurate.

## 2015-11-24 NOTE — Patient Instructions (Addendum)
Please use medication as prescribed.  I would like you to hydrate well with water.  Please try the nedi-pot, and you can see about an air purifier.  I would like you to continue to take the claritin as you were doing.  Be careful with sudafed use.  I will refill the ativan.      IF you received an x-ray today, you will receive an invoice from [facility] Radiology. Please contact [facility] at [phone] with questions or concerns regarding your invoice.   IF you received labwork today, you will receive an invoice from [employer]. Please contact [facility] at [phone] with questions or concerns regarding your invoice.   Our billing staff will not be able to assist you with questions regarding bills from these companies.  You will be contacted with the lab results as soon as they are available. The fastest way to get your results is to activate your My Chart account. Instructions are located on the last page of this paperwork. If you have not heard from [place] regarding the results in 2 weeks, please contact this office.

## 2015-12-01 ENCOUNTER — Telehealth: Payer: Self-pay

## 2015-12-01 NOTE — Telephone Encounter (Addendum)
[employer] [place] called back w/ addl question from pharm who is reviewing PA. Wants to know if the pt's insomnia is due to anxiety or situational stress? Tried to call pt while ins was on phone but had to LM to CB. Please ask pt this question if he calls back. Also tried to check with Colletta [place], but she was not available at the time of the call.  When we get the answer, [employer] can be called back with answer at [phone], option #5, case # [id number].

## 2015-12-01 NOTE — Telephone Encounter (Signed)
PA had been completed yesterday on covermymeds but showed as status "cancelled" when I checked. [place] called me and we completed on phone. Pending.

## 2015-12-07 ENCOUNTER — Ambulatory Visit (HOSPITAL_COMMUNITY)
Admission: RE | Admit: 2015-12-07 | Discharge: 2015-12-07 | Disposition: A | Discharge status: Home / Self Care | Payer: Medicare Other | Source: Ambulatory Visit | Attending: Internal Medicine | Admitting: Internal Medicine

## 2015-12-07 ENCOUNTER — Other Ambulatory Visit (HOSPITAL_BASED_OUTPATIENT_CLINIC_OR_DEPARTMENT_OTHER): Payer: Medicare Other

## 2015-12-07 DIAGNOSIS — I700 Atherosclerosis of aorta: Secondary | ICD-10-CM | POA: Diagnosis not present

## 2015-12-07 DIAGNOSIS — Z5111 Encounter for antineoplastic chemotherapy: Secondary | ICD-10-CM

## 2015-12-07 DIAGNOSIS — C8190 Hodgkin lymphoma, unspecified, unspecified site: Secondary | ICD-10-CM | POA: Diagnosis not present

## 2015-12-07 DIAGNOSIS — N400 Enlarged prostate without lower urinary tract symptoms: Secondary | ICD-10-CM | POA: Diagnosis not present

## 2015-12-07 DIAGNOSIS — I2510 Atherosclerotic heart disease of native coronary artery without angina pectoris: Secondary | ICD-10-CM | POA: Diagnosis not present

## 2015-12-07 DIAGNOSIS — K449 Diaphragmatic hernia without obstruction or gangrene: Secondary | ICD-10-CM | POA: Insufficient documentation

## 2015-12-07 DIAGNOSIS — C8208 Follicular lymphoma grade I, lymph nodes of multiple sites: Secondary | ICD-10-CM

## 2015-12-07 DIAGNOSIS — K561 Intussusception: Secondary | ICD-10-CM | POA: Diagnosis not present

## 2015-12-07 LAB — CBC WITH DIFFERENTIAL/PLATELET
BASO%: 0.5 % (ref 0.0–2.0)
BASOS ABS: 0 10*3/uL (ref 0.0–0.1)
EOS ABS: 0.1 10*3/uL (ref 0.0–0.5)
EOS%: 2.1 % (ref 0.0–7.0)
HCT: 41.7 % (ref 38.4–49.9)
HGB: 14.1 g/dL (ref 13.0–17.1)
LYMPH%: 24.6 % (ref 14.0–49.0)
MCH: 29.1 pg (ref 27.2–33.4)
MCHC: 33.8 g/dL (ref 32.0–36.0)
MCV: 86.2 fL (ref 79.3–98.0)
MONO#: 1.3 10*3/uL — AB (ref 0.1–0.9)
MONO%: 23 % — AB (ref 0.0–14.0)
NEUT%: 49.8 % (ref 39.0–75.0)
NEUTROS ABS: 2.8 10*3/uL (ref 1.5–6.5)
PLATELETS: ADEQUATE 10*3/uL (ref 140–400)
RBC: 4.84 10*6/uL (ref 4.20–5.82)
RDW: 13.4 % (ref 11.0–14.6)
WBC: 5.6 10*3/uL (ref 4.0–10.3)
lymph#: 1.4 10*3/uL (ref 0.9–3.3)

## 2015-12-07 LAB — COMPREHENSIVE METABOLIC PANEL
ALBUMIN: 3.3 g/dL — AB (ref 3.5–5.0)
ALK PHOS: 170 U/L — AB (ref 40–150)
ALT: 16 U/L (ref 0–55)
ANION GAP: 11 meq/L (ref 3–11)
AST: 21 U/L (ref 5–34)
BILIRUBIN TOTAL: 0.43 mg/dL (ref 0.20–1.20)
BUN: 16.5 mg/dL (ref 7.0–26.0)
CO2: 23 meq/L (ref 22–29)
Calcium: 9.6 mg/dL (ref 8.4–10.4)
Chloride: 104 mEq/L (ref 98–109)
Creatinine: 1.2 mg/dL (ref 0.7–1.3)
EGFR: 58 mL/min/{1.73_m2} — AB (ref >90)
Glucose: 91 mg/dl (ref 70–140)
POTASSIUM: 4.6 meq/L (ref 3.5–5.1)
Sodium: 138 mEq/L (ref 136–145)
TOTAL PROTEIN: 7 g/dL (ref 6.4–8.3)

## 2015-12-07 LAB — LACTATE DEHYDROGENASE: LDH: 263 U/L — ABNORMAL HIGH (ref 125–245)

## 2015-12-07 MED ORDER — IOPAMIDOL (ISOVUE-300) INJECTION 61%
100.0000 mL | Freq: Once | INTRAVENOUS | Status: AC | PRN
  Administered 2015-12-07: 100 mL via INTRAVENOUS

## 2015-12-12 ENCOUNTER — Encounter: Payer: Self-pay | Admitting: Internal Medicine

## 2015-12-12 ENCOUNTER — Telehealth: Payer: Self-pay | Admitting: *Deleted

## 2015-12-12 ENCOUNTER — Ambulatory Visit (HOSPITAL_BASED_OUTPATIENT_CLINIC_OR_DEPARTMENT_OTHER): Payer: Medicare Other | Admitting: Internal Medicine

## 2015-12-12 ENCOUNTER — Telehealth: Payer: Self-pay | Admitting: Internal Medicine

## 2015-12-12 VITALS — BP 108/76 | HR 82 | Temp 98.2°F | Resp 18 | Ht 67.0 in | Wt 158.6 lb

## 2015-12-12 DIAGNOSIS — C8208 Follicular lymphoma grade I, lymph nodes of multiple sites: Secondary | ICD-10-CM

## 2015-12-12 DIAGNOSIS — Z5111 Encounter for antineoplastic chemotherapy: Secondary | ICD-10-CM

## 2015-12-12 NOTE — Telephone Encounter (Signed)
Message sent to chemo scheduler to add chemo. Avs report and appointment schedule given to patient, per 03/14/15 los.

## 2015-12-12 NOTE — Telephone Encounter (Signed)
Per LOS I have scheduled appt and notified the scheduler

## 2015-12-12 NOTE — Telephone Encounter (Signed)
Avs report and appointment schedule given to patient, per 12/12/15 los °

## 2015-12-12 NOTE — Progress Notes (Signed)
[facility] [phone]   Fax:([phone]) [place], [place] [address]  PRINCIPAL DIAGNOSIS: Bulky stage IV low grade lymphoma diagnosed in November 2009.   PRIOR THERAPY:  1. Status post 7 cycles of systemic chemotherapy with CHOP/Rituxan. Last dose was given May 2010. 2. Status post 6 cycles of maintenance Rituxan 375 mg/sq m given every 2 months. Last dose was given on May 29, 2010, and the patient was lost to followup at that time. He received another dose on 12/11/2010, then again missed 2 doses. 3. systemic chemotherapy with Rituxan 375 mg/M2 on day 1 and that bendamustine 90 mg/M2 on days 1 and 2 every 4 weeks, status post 6 cycles. 4. Maintenance Rituxan 375 mg/M2 every 2 months, status post 12 cycles   CURRENT THERAPY: Rituxan 375 MG/M2 weekly for 4 weeks, last dose today, followed by maintenance treatment every 2 months. Status post 9 cycles.   INTERVAL HISTORY: Eric [name] 75 y.o. male returns to the clinic today for two-month followup visit. The patient is feeling fine today with no specific complaints. He is tolerating his treatment with weekly Rituxan fairly well with no significant adverse effects. He denied having any significant chest pain, shortness of breath, cough or hemoptysis. He denied having any weight loss or night sweats. He has no fever or chills, no nausea or vomiting. He has no lymphadenopathy. He is very active and plays golf at regular basis. He had repeat CT scan of the chest, abdomen and pelvis performed recently and he is here for evaluation and discussion of his scan results before starting cycle #10.Marland Kitchen  MEDICAL HISTORY: Past Medical History:  Diagnosis Date  . Cancer ([place])   . Coronary atherosclerosis of native coronary artery   . Encounter for antineoplastic chemotherapy 06/20/2015  . GERD (gastroesophageal reflux disease)   . History of heart attack   .  Hyperlipidemia   . Myocardial infarction   . Non Hodgkin's lymphoma ([place])     ALLERGIES:  is allergic to neosporin [neomycin-bacitracin zn-polymyx].  MEDICATIONS:  Current Outpatient Prescriptions  Medication Sig Dispense Refill  . aspirin EC 81 MG tablet Take 81 mg by mouth at bedtime.    . diphenhydrAMINE (BENADRYL) 25 MG tablet Take 25 mg by mouth at bedtime as needed for sleep.     . Guaifenesin (MUCINEX MAXIMUM STRENGTH) 1200 MG TB12 Take 1 tablet (1,200 mg total) by mouth every 12 (twelve) hours as needed. 14 tablet 1  . ipratropium (ATROVENT) 0.03 % nasal spray Place 2 sprays into both nostrils 2 (two) times daily. 30 mL 0  . LORazepam (ATIVAN) 0.5 MG tablet Take 1 tablet (0.5 mg total) by mouth at bedtime as needed. for anxiety 30 tablet 0  . nitroGLYCERIN (NITROSTAT) 0.4 MG SL tablet Place 1 tablet (0.4 mg total) under the tongue every 5 (five) minutes x 3 doses as needed for chest pain. (Patient not taking: Reported on 11/24/2015) 25 tablet 3  . omeprazole (PRILOSEC) 20 MG capsule Take 20 mg by mouth daily as needed (acid relfux).    . sildenafil (VIAGRA) 25 MG tablet Take 1 tablet (25 mg total) by mouth daily as needed. Take 2-3 tablets by mouth once daily as needed. (Patient not taking: Reported on 11/24/2015) 90 tablet 6   No current facility-administered medications for this visit.     SURGICAL HISTORY:  Past Surgical History:  Procedure Laterality Date  . CORONARY STENT  PLACEMENT    . LEFT HEART CATHETERIZATION WITH CORONARY ANGIOGRAM N/A 03/03/2012   Procedure: LEFT HEART CATHETERIZATION WITH CORONARY ANGIOGRAM;  Surgeon: [name], MD;  Location: [facility] CATH LAB;  Service: Cardiovascular;  Laterality: N/A;  . PERCUTANEOUS CORONARY STENT INTERVENTION (PCI-S) N/A 03/04/2012   Procedure: PERCUTANEOUS CORONARY STENT INTERVENTION (PCI-S);  Surgeon: [name], MD;  Location: [facility] CATH LAB;  Service: Cardiovascular;  Laterality: N/A;    REVIEW OF SYSTEMS:  Constitutional:  negative Eyes: negative Ears, nose, mouth, throat, and face: negative Respiratory: negative Cardiovascular: negative Gastrointestinal: negative Genitourinary:negative Integument/breast: negative Hematologic/lymphatic: negative Musculoskeletal:negative Neurological: negative Behavioral/Psych: negative Endocrine: negative Allergic/Immunologic: negative   PHYSICAL EXAMINATION: General appearance: alert, cooperative and no distress Head: Normocephalic, without obvious abnormality, atraumatic Neck: no adenopathy Lymph nodes: Cervical, supraclavicular, and axillary nodes normal. Resp: clear to auscultation bilaterally Back: symmetric, no curvature. ROM normal. No CVA tenderness. Cardio: regular rate and rhythm, S1, S2 normal, no murmur, click, rub or gallop GI: soft, non-tender; bowel sounds normal; no masses,  no organomegaly Extremities: extremities normal, atraumatic, no cyanosis or edema Neurologic: Alert and oriented X 3, normal strength and tone. Normal symmetric reflexes. Normal coordination and gait  ECOG PERFORMANCE STATUS: 0 - Asymptomatic  Blood pressure 108/76, pulse 82, temperature 98.2 F (36.8 C), temperature source Oral, resp. rate 18, height '5\' 7"'$  (1.702 m), weight 158 lb 9.6 oz (71.9 kg), SpO2 99 %.  LABORATORY DATA: Lab Results  Component Value Date   WBC 5.6 12/07/2015   HGB 14.1 12/07/2015   HCT 41.7 12/07/2015   MCV 86.2 12/07/2015   PLT Clumped Platelets--Appears Adequate 12/07/2015      Chemistry      Component Value Date/Time   NA 138 12/07/2015 0816   K 4.6 12/07/2015 0816   CL 105 09/27/2015 2106   CL 106 08/06/2012 1244   CO2 23 12/07/2015 0816   BUN 16.5 12/07/2015 0816   CREATININE 1.2 12/07/2015 0816      Component Value Date/Time   CALCIUM 9.6 12/07/2015 0816   ALKPHOS 170 (H) 12/07/2015 0816   AST 21 12/07/2015 0816   ALT 16 12/07/2015 0816   BILITOT 0.43 12/07/2015 0816       RADIOGRAPHIC STUDIES: Ct Chest W Contrast  Result  Date: 12/07/2015 CLINICAL DATA:  Lymphoma. EXAM: CT CHEST, ABDOMEN, AND PELVIS WITH CONTRAST TECHNIQUE: Multidetector CT imaging of the chest, abdomen and pelvis was performed following the standard protocol during bolus administration of intravenous contrast. CONTRAST:  181m ISOVUE-300 IOPAMIDOL (ISOVUE-300) INJECTION 61% COMPARISON:  06/13/2015. FINDINGS: CT CHEST FINDINGS Cardiovascular: Atherosclerotic calcification of the arterial vasculature, including three-vessel involvement of the coronary arteries. Heart size normal. No pericardial diffusion. Mediastinum/Nodes: No pathologically enlarged mediastinal, hilar and or axillary lymph nodes. Surgical clips in the right axilla. Small to moderate hiatal hernia. Lungs/Pleura: Minimal biapical pleural parenchymal scarring. Lungs are clear. No pleural fluid. Airway is unremarkable. Musculoskeletal: No worrisome lytic or sclerotic lesions. CT ABDOMEN PELVIS FINDINGS Hepatobiliary: Liver and gallbladder are unremarkable. No biliary ductal dilatation. Pancreas: Negative. Spleen: Negative. Adrenals/Urinary Tract: Adrenal glands are unremarkable. Low-attenuation lesions in the kidneys measure up to 5.1 cm on the right and are likely cysts. Ureters are decompressed. Bladder is indented by the prostate. Stomach/Bowel: Small to moderate hiatal hernia. Stomach is otherwise unremarkable. There are 2 areas of jejunal intussusception in the left abdomen (series 2, images 65-70 and 76-79). These areas are not imaged on nephrographic phase imaging. Small bowel, appendix and colon are otherwise unremarkable. Vascular/Lymphatic: Atherosclerotic  calcification of the arterial vasculature without abdominal aortic aneurysm. A small bowel mesenteric lymph node measures 1.5 cm (series 2, image 65), likely stable. No additional pathologically enlarged retroperitoneal, mesenteric or abdominal peritoneal ligament lymph nodes. Reproductive: Prostate is markedly enlarged. Other: No free fluid.  Mesenteries and peritoneum are unremarkable. Periumbilical hernia contains fat. Musculoskeletal: No worrisome lytic or sclerotic lesions. IMPRESSION: 1. Enlarged small bowel mesenteric lymph node is stable. No additional adenopathy in the chest, abdomen or pelvis. 2. Aortic atherosclerosis and three-vessel coronary artery calcification. 3. Small to moderate hiatal hernia. 4. 2 areas of jejunal intussusception in the left abdomen. Attention on followup exams is warranted as lead point lesions cannot be definitively excluded. 5. Markedly enlarged prostate. Electronically Signed   By: Lorin Picket M.D.   On: 12/07/2015 12:22   Ct Abdomen Pelvis W Contrast  Result Date: 12/07/2015 CLINICAL DATA:  Lymphoma. EXAM: CT CHEST, ABDOMEN, AND PELVIS WITH CONTRAST TECHNIQUE: Multidetector CT imaging of the chest, abdomen and pelvis was performed following the standard protocol during bolus administration of intravenous contrast. CONTRAST:  133m ISOVUE-300 IOPAMIDOL (ISOVUE-300) INJECTION 61% COMPARISON:  06/13/2015. FINDINGS: CT CHEST FINDINGS Cardiovascular: Atherosclerotic calcification of the arterial vasculature, including three-vessel involvement of the coronary arteries. Heart size normal. No pericardial diffusion. Mediastinum/Nodes: No pathologically enlarged mediastinal, hilar and or axillary lymph nodes. Surgical clips in the right axilla. Small to moderate hiatal hernia. Lungs/Pleura: Minimal biapical pleural parenchymal scarring. Lungs are clear. No pleural fluid. Airway is unremarkable. Musculoskeletal: No worrisome lytic or sclerotic lesions. CT ABDOMEN PELVIS FINDINGS Hepatobiliary: Liver and gallbladder are unremarkable. No biliary ductal dilatation. Pancreas: Negative. Spleen: Negative. Adrenals/Urinary Tract: Adrenal glands are unremarkable. Low-attenuation lesions in the kidneys measure up to 5.1 cm on the right and are likely cysts. Ureters are decompressed. Bladder is indented by the prostate.  Stomach/Bowel: Small to moderate hiatal hernia. Stomach is otherwise unremarkable. There are 2 areas of jejunal intussusception in the left abdomen (series 2, images 65-70 and 76-79). These areas are not imaged on nephrographic phase imaging. Small bowel, appendix and colon are otherwise unremarkable. Vascular/Lymphatic: Atherosclerotic calcification of the arterial vasculature without abdominal aortic aneurysm. A small bowel mesenteric lymph node measures 1.5 cm (series 2, image 65), likely stable. No additional pathologically enlarged retroperitoneal, mesenteric or abdominal peritoneal ligament lymph nodes. Reproductive: Prostate is markedly enlarged. Other: No free fluid. Mesenteries and peritoneum are unremarkable. Periumbilical hernia contains fat. Musculoskeletal: No worrisome lytic or sclerotic lesions. IMPRESSION: 1. Enlarged small bowel mesenteric lymph node is stable. No additional adenopathy in the chest, abdomen or pelvis. 2. Aortic atherosclerosis and three-vessel coronary artery calcification. 3. Small to moderate hiatal hernia. 4. 2 areas of jejunal intussusception in the left abdomen. Attention on followup exams is warranted as lead point lesions cannot be definitively excluded. 5. Markedly enlarged prostate. Electronically Signed   By: MLorin PicketM.D.   On: 12/07/2015 12:22   ASSESSMENT AND PLAN: This is a very pleasant 75years old male with history of bulky stage IV low-grade lymphoma completed maintenance Rituxan status post 12 cycles. Has been observation but the recent CT scan of the chest, abdomen and pelvis showed evidence for disease progression. The patient was started on weekly Rituxan status post 9 cycles. The recent CT scan of the chest, abdomen and pelvis showed no clear evidence for disease progression. I discussed the scan results with the patient today. I recommended for him to proceed with the treatment with cycle #10 of Rituxan today as a scheduled. He would come  back  for follow-up visit in 2 months for reevaluation before starting cycle #11.  He was advised to call immediately if he has any concerning symptoms in the interval. The patient voices understanding of current disease status and treatment options and is in agreement with the current care plan.  All questions were answered. The patient knows to call the clinic with any problems, questions or concerns. We can certainly see the patient much sooner if necessary.  Disclaimer: This note was dictated with voice recognition software. Similar sounding words can inadvertently be transcribed and may not be corrected upon review.

## 2015-12-15 ENCOUNTER — Ambulatory Visit (HOSPITAL_BASED_OUTPATIENT_CLINIC_OR_DEPARTMENT_OTHER): Payer: Medicare Other

## 2015-12-15 ENCOUNTER — Other Ambulatory Visit (HOSPITAL_BASED_OUTPATIENT_CLINIC_OR_DEPARTMENT_OTHER): Payer: Medicare Other

## 2015-12-15 VITALS — BP 121/74 | HR 69 | Temp 98.2°F | Resp 16

## 2015-12-15 DIAGNOSIS — C8208 Follicular lymphoma grade I, lymph nodes of multiple sites: Secondary | ICD-10-CM

## 2015-12-15 DIAGNOSIS — Z5112 Encounter for antineoplastic immunotherapy: Secondary | ICD-10-CM

## 2015-12-15 DIAGNOSIS — Z5111 Encounter for antineoplastic chemotherapy: Secondary | ICD-10-CM

## 2015-12-15 LAB — CBC WITH DIFFERENTIAL/PLATELET
BASO%: 0.2 % (ref 0.0–2.0)
Basophils Absolute: 0 10*3/uL (ref 0.0–0.1)
EOS%: 10.5 % — ABNORMAL HIGH (ref 0.0–7.0)
Eosinophils Absolute: 0.5 10*3/uL (ref 0.0–0.5)
HCT: 39.3 % (ref 38.4–49.9)
HGB: 13.3 g/dL (ref 13.0–17.1)
LYMPH%: 34.4 % (ref 14.0–49.0)
MCH: 29 pg (ref 27.2–33.4)
MCHC: 33.8 g/dL (ref 32.0–36.0)
MCV: 85.6 fL (ref 79.3–98.0)
MONO#: 1 10*3/uL — ABNORMAL HIGH (ref 0.1–0.9)
MONO%: 20.3 % — AB (ref 0.0–14.0)
NEUT%: 34.6 % — AB (ref 39.0–75.0)
NEUTROS ABS: 1.7 10*3/uL (ref 1.5–6.5)
PLATELETS: 337 10*3/uL (ref 140–400)
RBC: 4.59 10*6/uL (ref 4.20–5.82)
RDW: 13.6 % (ref 11.0–14.6)
WBC: 5 10*3/uL (ref 4.0–10.3)
lymph#: 1.7 10*3/uL (ref 0.9–3.3)

## 2015-12-15 LAB — COMPREHENSIVE METABOLIC PANEL
ALT: 17 U/L (ref 0–55)
ANION GAP: 11 meq/L (ref 3–11)
AST: 20 U/L (ref 5–34)
Albumin: 3.1 g/dL — ABNORMAL LOW (ref 3.5–5.0)
Alkaline Phosphatase: 141 U/L (ref 40–150)
BILIRUBIN TOTAL: 0.4 mg/dL (ref 0.20–1.20)
BUN: 12.8 mg/dL (ref 7.0–26.0)
CHLORIDE: 107 meq/L (ref 98–109)
CO2: 22 meq/L (ref 22–29)
Calcium: 9.2 mg/dL (ref 8.4–10.4)
Creatinine: 1.1 mg/dL (ref 0.7–1.3)
EGFR: 68 mL/min/{1.73_m2} — AB (ref >90)
GLUCOSE: 83 mg/dL (ref 70–140)
Potassium: 4.3 mEq/L (ref 3.5–5.1)
SODIUM: 140 meq/L (ref 136–145)
TOTAL PROTEIN: 6.3 g/dL — AB (ref 6.4–8.3)

## 2015-12-15 LAB — LACTATE DEHYDROGENASE: LDH: 223 U/L (ref 125–245)

## 2015-12-15 MED ORDER — ACETAMINOPHEN 325 MG PO TABS
ORAL_TABLET | ORAL | Status: AC
  Filled 2015-12-15: qty 2

## 2015-12-15 MED ORDER — RITUXIMAB CHEMO INJECTION 500 MG/50ML
375.0000 mg/m2 | Freq: Once | INTRAVENOUS | Status: AC
  Administered 2015-12-15: 700 mg via INTRAVENOUS
  Filled 2015-12-15: qty 50

## 2015-12-15 MED ORDER — SODIUM CHLORIDE 0.9 % IV SOLN
Freq: Once | INTRAVENOUS | Status: AC
  Administered 2015-12-15: 09:00:00 via INTRAVENOUS

## 2015-12-15 MED ORDER — DIPHENHYDRAMINE HCL 25 MG PO CAPS
50.0000 mg | ORAL_CAPSULE | Freq: Once | ORAL | Status: AC
  Administered 2015-12-15: 50 mg via ORAL

## 2015-12-15 MED ORDER — DIPHENHYDRAMINE HCL 25 MG PO CAPS
ORAL_CAPSULE | ORAL | Status: AC
  Filled 2015-12-15: qty 2

## 2015-12-15 MED ORDER — ACETAMINOPHEN 325 MG PO TABS
650.0000 mg | ORAL_TABLET | Freq: Once | ORAL | Status: AC
  Administered 2015-12-15: 650 mg via ORAL

## 2015-12-15 NOTE — Patient Instructions (Signed)
[facility] Discharge Instructions for Patients Receiving Chemotherapy  Today you received the following chemotherapy agents: Rituxan   To help prevent nausea and vomiting after your treatment, we encourage you to take your nausea medication as directed.    If you develop nausea and vomiting that is not controlled by your nausea medication, call the clinic.   BELOW ARE SYMPTOMS THAT SHOULD BE REPORTED IMMEDIATELY:  *FEVER GREATER THAN 100.5 F  *CHILLS WITH OR WITHOUT FEVER  NAUSEA AND VOMITING THAT IS NOT CONTROLLED WITH YOUR NAUSEA MEDICATION  *UNUSUAL SHORTNESS OF BREATH  *UNUSUAL BRUISING OR BLEEDING  TENDERNESS IN MOUTH AND THROAT WITH OR WITHOUT PRESENCE OF ULCERS  *URINARY PROBLEMS  *BOWEL PROBLEMS  UNUSUAL RASH Items with * indicate a potential emergency and should be followed up as soon as possible.  Feel free to call the clinic you have any questions or concerns. The clinic phone number is [phone].  Please show the CHEMO ALERT CARD at check-in to the Emergency Department and triage nurse.   

## 2016-02-15 ENCOUNTER — Other Ambulatory Visit: Payer: Self-pay | Admitting: Medical Oncology

## 2016-02-15 DIAGNOSIS — C8208 Follicular lymphoma grade I, lymph nodes of multiple sites: Secondary | ICD-10-CM

## 2016-02-16 ENCOUNTER — Encounter: Payer: Self-pay | Admitting: Internal Medicine

## 2016-02-16 ENCOUNTER — Ambulatory Visit (HOSPITAL_BASED_OUTPATIENT_CLINIC_OR_DEPARTMENT_OTHER): Payer: Medicare Other | Admitting: Internal Medicine

## 2016-02-16 ENCOUNTER — Other Ambulatory Visit (HOSPITAL_BASED_OUTPATIENT_CLINIC_OR_DEPARTMENT_OTHER): Payer: Medicare Other

## 2016-02-16 ENCOUNTER — Ambulatory Visit (HOSPITAL_BASED_OUTPATIENT_CLINIC_OR_DEPARTMENT_OTHER): Payer: Medicare Other

## 2016-02-16 VITALS — BP 144/81 | HR 71 | Temp 97.6°F | Resp 18 | Ht 67.0 in | Wt 162.1 lb

## 2016-02-16 VITALS — BP 97/56 | HR 72 | Temp 97.8°F | Resp 20

## 2016-02-16 DIAGNOSIS — C8208 Follicular lymphoma grade I, lymph nodes of multiple sites: Secondary | ICD-10-CM

## 2016-02-16 DIAGNOSIS — Z23 Encounter for immunization: Secondary | ICD-10-CM

## 2016-02-16 DIAGNOSIS — Z5112 Encounter for antineoplastic immunotherapy: Secondary | ICD-10-CM | POA: Diagnosis not present

## 2016-02-16 DIAGNOSIS — Z5111 Encounter for antineoplastic chemotherapy: Secondary | ICD-10-CM

## 2016-02-16 LAB — COMPREHENSIVE METABOLIC PANEL
ALK PHOS: 146 U/L (ref 40–150)
ALT: 42 U/L (ref 0–55)
ANION GAP: 9 meq/L (ref 3–11)
AST: 42 U/L — ABNORMAL HIGH (ref 5–34)
Albumin: 3.4 g/dL — ABNORMAL LOW (ref 3.5–5.0)
BILIRUBIN TOTAL: 0.45 mg/dL (ref 0.20–1.20)
BUN: 13.3 mg/dL (ref 7.0–26.0)
CALCIUM: 9.1 mg/dL (ref 8.4–10.4)
CO2: 25 meq/L (ref 22–29)
CREATININE: 1 mg/dL (ref 0.7–1.3)
Chloride: 107 mEq/L (ref 98–109)
EGFR: 75 mL/min/{1.73_m2} — AB (ref >90)
Glucose: 75 mg/dl (ref 70–140)
Potassium: 4.1 mEq/L (ref 3.5–5.1)
Sodium: 140 mEq/L (ref 136–145)
TOTAL PROTEIN: 6.2 g/dL — AB (ref 6.4–8.3)

## 2016-02-16 LAB — CBC WITH DIFFERENTIAL/PLATELET
BASO%: 0.8 % (ref 0.0–2.0)
BASOS ABS: 0 10*3/uL (ref 0.0–0.1)
EOS%: 3.3 % (ref 0.0–7.0)
Eosinophils Absolute: 0.2 10*3/uL (ref 0.0–0.5)
HCT: 38.4 % (ref 38.4–49.9)
HEMOGLOBIN: 12.9 g/dL — AB (ref 13.0–17.1)
LYMPH#: 1.8 10*3/uL (ref 0.9–3.3)
LYMPH%: 36.8 % (ref 14.0–49.0)
MCH: 28.6 pg (ref 27.2–33.4)
MCHC: 33.6 g/dL (ref 32.0–36.0)
MCV: 85.1 fL (ref 79.3–98.0)
MONO#: 0.9 10*3/uL (ref 0.1–0.9)
MONO%: 17.5 % — AB (ref 0.0–14.0)
NEUT#: 2 10*3/uL (ref 1.5–6.5)
NEUT%: 41.6 % (ref 39.0–75.0)
Platelets: 286 10*3/uL (ref 140–400)
RBC: 4.51 10*6/uL (ref 4.20–5.82)
RDW: 15.1 % — ABNORMAL HIGH (ref 11.0–14.6)
WBC: 4.9 10*3/uL (ref 4.0–10.3)
nRBC: 0 % (ref 0–0)

## 2016-02-16 MED ORDER — ACETAMINOPHEN 325 MG PO TABS
650.0000 mg | ORAL_TABLET | Freq: Once | ORAL | Status: AC
  Administered 2016-02-16: 650 mg via ORAL

## 2016-02-16 MED ORDER — ACETAMINOPHEN 325 MG PO TABS
ORAL_TABLET | ORAL | Status: AC
  Filled 2016-02-16: qty 2

## 2016-02-16 MED ORDER — DIPHENHYDRAMINE HCL 25 MG PO CAPS
ORAL_CAPSULE | ORAL | Status: AC
  Filled 2016-02-16: qty 2

## 2016-02-16 MED ORDER — DIPHENHYDRAMINE HCL 25 MG PO CAPS
ORAL_CAPSULE | ORAL | Status: AC
  Filled 2016-02-16: qty 1

## 2016-02-16 MED ORDER — SODIUM CHLORIDE 0.9 % IV SOLN
375.0000 mg/m2 | Freq: Once | INTRAVENOUS | Status: AC
  Administered 2016-02-16: 700 mg via INTRAVENOUS
  Filled 2016-02-16: qty 50

## 2016-02-16 MED ORDER — DIPHENHYDRAMINE HCL 25 MG PO CAPS
50.0000 mg | ORAL_CAPSULE | Freq: Once | ORAL | Status: AC
  Administered 2016-02-16: 50 mg via ORAL

## 2016-02-16 MED ORDER — SODIUM CHLORIDE 0.9 % IV SOLN
Freq: Once | INTRAVENOUS | Status: AC
  Administered 2016-02-16: 12:00:00 via INTRAVENOUS

## 2016-02-16 MED ORDER — INFLUENZA VAC SPLIT QUAD 0.5 ML IM SUSY
0.5000 mL | PREFILLED_SYRINGE | Freq: Once | INTRAMUSCULAR | Status: AC
  Administered 2016-02-16: 0.5 mL via INTRAMUSCULAR
  Filled 2016-02-16: qty 0.5

## 2016-02-16 NOTE — Patient Instructions (Signed)
[facility] Discharge Instructions for Patients Receiving Chemotherapy  Today you received the following chemotherapy agents:  Rituxan  To help prevent nausea and vomiting after your treatment, we encourage you to take your nausea medication as ordered per MD.   If you develop nausea and vomiting that is not controlled by your nausea medication, call the clinic.   BELOW ARE SYMPTOMS THAT SHOULD BE REPORTED IMMEDIATELY:  *FEVER GREATER THAN 100.5 F  *CHILLS WITH OR WITHOUT FEVER  NAUSEA AND VOMITING THAT IS NOT CONTROLLED WITH YOUR NAUSEA MEDICATION  *UNUSUAL SHORTNESS OF BREATH  *UNUSUAL BRUISING OR BLEEDING  TENDERNESS IN MOUTH AND THROAT WITH OR WITHOUT PRESENCE OF ULCERS  *URINARY PROBLEMS  *BOWEL PROBLEMS  UNUSUAL RASH Items with * indicate a potential emergency and should be followed up as soon as possible.  Feel free to call the clinic you have any questions or concerns. The clinic phone number is [phone].  Please show the [place] at check-in to the Emergency Department and triage nurse.

## 2016-02-16 NOTE — Progress Notes (Signed)
[place] [phone]   [phone]  OFFICE PROGRESS NOTE  DIAGNOSIS: Bulky stage IV low-grade lymphoma diagnosed in November 2009.  PRIOR THERAPY: 1. Status post 7 cycles of systemic chemotherapy with CHOP/Rituxan. Last dose was given May 2010. 2. Status post 6 cycles of maintenance Rituxan 375 mg/sq m given every 2 months. Last dose was given on May 29, 2010, and the patient was lost to followup at that time. He received another dose on 12/11/2010, then again missed 2 doses. 3. systemic chemotherapy with Rituxan 375 mg/M2 on day 1 and that bendamustine 90 mg/M2 on days 1 and 2 every 4 weeks, status post 6 cycles. 4. Maintenance Rituxan 375 mg/M2 every 2 months, status post 12 cycles 5.  CURRENT THERAPY: Rituxan 375 MG/M2 given weekly for 4 weeks and this is followed by maintenance treatment every 2 months, status post 10 cycles.  INTERVAL HISTORY: Eric [name] 75 y.o. male returns to the clinic today for two-month follow-up visit. The patient is feeling fine today was no specific complaints. He noticed that his right hand is bigger than the left hand. This has been going on for more than a year and there is no clear swelling in that area. He has no palpable lymphadenopathy in the right axillary area. He denied having any fatigue or weakness. He denied having any weight loss or night sweats. He has no nausea, vomiting, diarrhea or constipation. He is here today for evaluation before starting cycle #11 of his maintenance treatment with Rituxan.  MEDICAL HISTORY: Past Medical History:  Diagnosis Date  . Cancer ([place])   . Coronary atherosclerosis of native coronary artery   . Encounter for antineoplastic chemotherapy 06/20/2015  . GERD (gastroesophageal reflux disease)   . History of heart attack   . Hyperlipidemia   . Myocardial infarction   . Non Hodgkin's lymphoma ([place])     ALLERGIES:  is allergic to neosporin [neomycin-bacitracin  zn-polymyx].  MEDICATIONS:  Current Outpatient Prescriptions  Medication Sig Dispense Refill  . aspirin EC 81 MG tablet Take 81 mg by mouth at bedtime.    . diphenhydrAMINE (BENADRYL) 25 MG tablet Take 25 mg by mouth at bedtime as needed for sleep.     Marland Kitchen LORazepam (ATIVAN) 0.5 MG tablet Take 1 tablet (0.5 mg total) by mouth at bedtime as needed. for anxiety 30 tablet 0  . omeprazole (PRILOSEC) 20 MG capsule Take 20 mg by mouth daily as needed (acid relfux).    . sildenafil (VIAGRA) 25 MG tablet Take 1 tablet (25 mg total) by mouth daily as needed. Take 2-3 tablets by mouth once daily as needed. 90 tablet 6  . Guaifenesin (MUCINEX MAXIMUM STRENGTH) 1200 MG TB12 Take 1 tablet (1,200 mg total) by mouth every 12 (twelve) hours as needed. (Patient not taking: Reported on 02/16/2016) 14 tablet 1  . ipratropium (ATROVENT) 0.03 % nasal spray Place 2 sprays into both nostrils 2 (two) times daily. (Patient not taking: Reported on 02/16/2016) 30 mL 0  . nitroGLYCERIN (NITROSTAT) 0.4 MG SL tablet Place 1 tablet (0.4 mg total) under the tongue every 5 (five) minutes x 3 doses as needed for chest pain. (Patient not taking: Reported on 02/16/2016) 25 tablet 3   No current facility-administered medications for this visit.     SURGICAL HISTORY:  Past Surgical History:  Procedure Laterality Date  . CORONARY STENT PLACEMENT    . LEFT HEART CATHETERIZATION WITH CORONARY ANGIOGRAM N/A 03/03/2012   Procedure: LEFT HEART  CATHETERIZATION WITH CORONARY ANGIOGRAM;  Surgeon: [name], MD;  Location: [facility] CATH LAB;  Service: Cardiovascular;  Laterality: N/A;  . PERCUTANEOUS CORONARY STENT INTERVENTION (PCI-S) N/A 03/04/2012   Procedure: PERCUTANEOUS CORONARY STENT INTERVENTION (PCI-S);  Surgeon: [name], MD;  Location[facility] C[facility] LAB;  Service: Cardiovascular;  Laterality: N/A;    REVIEW OF SYSTEMS:  A comprehensive review of systems was negative.   PHYSICAL EXAMINATION: General appearance: alert,  cooperative and no distress Head: Normocephalic, without obvious abnormality, atraumatic Neck: no adenopathy, no JVD, supple, symmetrical, trachea midline and thyroid not enlarged, symmetric, no tenderness/mass/nodules Lymph nodes: Cervical, supraclavicular, and axillary nodes normal. Resp: clear to auscultation bilaterally Back: symmetric, no curvature. ROM normal. No CVA tenderness. Cardio: regular rate and rhythm, S1, S2 normal, no murmur, click, rub or gallop GI: soft, non-tender; bowel sounds normal; no masses,  no organomegaly Extremities: extremities normal, atraumatic, no cyanosis or edema  ECOG PERFORMANCE STATUS: 0 - Asymptomatic  Blood pressure (!) 144/81, pulse 71, temperature 97.6 F (36.4 C), temperature source Oral, resp. rate 18, height '5\' 7"'$  (1.702 m), weight 162 lb 1.6 oz (73.5 kg), SpO2 99 %.  LABORATORY DATA: Lab Results  Component Value Date   WBC 4.9 02/16/2016   HGB 12.9 (L) 02/16/2016   HCT 38.4 02/16/2016   MCV 85.1 02/16/2016   PLT 286 02/16/2016      Chemistry      Component Value Date/Time   NA 140 02/16/2016 0956   K 4.1 02/16/2016 0956   CL 105 09/27/2015 2106   CL 106 08/06/2012 1244   CO2 25 02/16/2016 0956   BUN 13.3 02/16/2016 0956   CREATININE 1.0 02/16/2016 0956      Component Value Date/Time   CALCIUM 9.1 02/16/2016 0956   ALKPHOS 146 02/16/2016 0956   AST 42 (H) 02/16/2016 0956   ALT 42 02/16/2016 0956   BILITOT 0.45 02/16/2016 0956       RADIOGRAPHIC STUDIES: No results found.  ASSESSMENT AND PLAN: This is a very pleasant 75 years old African-American male with history of bulky stage IV follicular lymphoma. He is status post systemic chemotherapy with CHOP/Rituxan and currently on maintenance treatment with Rituxan status post 10 cycles. The patient is tolerating the treatment well. I recommended for him to proceed with cycle #11 today as scheduled. He would come back for follow-up visit in 2 months for reevaluation before  the next cycle of his treatment. He was advised to call immediately if he has any concerning symptoms in the interval. The patient voices understanding of current disease status and treatment options and is in agreement with the current care plan.  All questions were answered. The patient knows to call the clinic with any problems, questions or concerns. We can certainly see the patient much sooner if necessary.  I spent 10 minutes counseling the patient face to face. The total time spent in the appointment was 15 minutes.  Disclaimer: This note was dictated with voice recognition software. Similar sounding words can inadvertently be transcribed and may not be corrected upon review.

## 2016-02-17 ENCOUNTER — Telehealth: Payer: Self-pay | Admitting: *Deleted

## 2016-02-17 NOTE — Telephone Encounter (Signed)
Per LOS I have scheduled appts and notified the scheduler 

## 2016-02-29 ENCOUNTER — Encounter: Payer: Self-pay | Admitting: Family Medicine

## 2016-02-29 ENCOUNTER — Ambulatory Visit (INDEPENDENT_AMBULATORY_CARE_PROVIDER_SITE_OTHER): Payer: Medicare Other | Admitting: Family Medicine

## 2016-02-29 VITALS — BP 123/76 | HR 73 | Temp 97.6°F | Resp 16 | Ht 67.0 in | Wt 158.8 lb

## 2016-02-29 DIAGNOSIS — Z125 Encounter for screening for malignant neoplasm of prostate: Secondary | ICD-10-CM | POA: Diagnosis not present

## 2016-02-29 DIAGNOSIS — Z23 Encounter for immunization: Secondary | ICD-10-CM | POA: Diagnosis not present

## 2016-02-29 DIAGNOSIS — C8208 Follicular lymphoma grade I, lymph nodes of multiple sites: Secondary | ICD-10-CM | POA: Diagnosis not present

## 2016-02-29 DIAGNOSIS — Z1211 Encounter for screening for malignant neoplasm of colon: Secondary | ICD-10-CM

## 2016-02-29 DIAGNOSIS — I214 Non-ST elevation (NSTEMI) myocardial infarction: Secondary | ICD-10-CM | POA: Diagnosis not present

## 2016-02-29 DIAGNOSIS — N529 Male erectile dysfunction, unspecified: Secondary | ICD-10-CM | POA: Diagnosis not present

## 2016-02-29 DIAGNOSIS — F5101 Primary insomnia: Secondary | ICD-10-CM

## 2016-02-29 DIAGNOSIS — Z131 Encounter for screening for diabetes mellitus: Secondary | ICD-10-CM | POA: Diagnosis not present

## 2016-02-29 DIAGNOSIS — I2510 Atherosclerotic heart disease of native coronary artery without angina pectoris: Secondary | ICD-10-CM | POA: Diagnosis not present

## 2016-02-29 DIAGNOSIS — E7800 Pure hypercholesterolemia, unspecified: Secondary | ICD-10-CM | POA: Diagnosis not present

## 2016-02-29 DIAGNOSIS — Z0000 Encounter for general adult medical examination without abnormal findings: Secondary | ICD-10-CM

## 2016-02-29 DIAGNOSIS — H903 Sensorineural hearing loss, bilateral: Secondary | ICD-10-CM | POA: Diagnosis not present

## 2016-02-29 DIAGNOSIS — R972 Elevated prostate specific antigen [PSA]: Secondary | ICD-10-CM

## 2016-02-29 MED ORDER — TRAZODONE HCL 50 MG PO TABS: 50.0000 mg | ORAL_TABLET | Freq: Every evening | ORAL | 3 refills | Status: DC | PRN

## 2016-02-29 NOTE — Progress Notes (Signed)
Subjective:    Patient ID: Eric [name], male    DOB: Sep 21, 1940, 75 y.o.   MRN: [id number]  02/29/2016  Annual Exam   HPI This 75 y.o. male presents for Annual Wellness Examination.  Last physical: 01-05-2015 Colonoscopy:2005; needs to do one; planned to schedule next month. Unable to set up.  [facility]. Eye exam:none recently; readers Dental exam:  Every year.  Immunization History  Administered Date(s) Administered  . Hepatitis B 10/06/2009, 04/27/2010  . Influenza,inj,Quad PF,36+ Mos 02/09/2014, 01/05/2015, 02/16/2016  . Pneumococcal Conjugate-13 02/29/2016   BP Readings from Last 3 Encounters:  02/29/16 123/76  02/16/16 (!) 97/56  02/16/16 (!) 144/81   Wt Readings from Last 3 Encounters:  02/29/16 158 lb 12.8 oz (72 kg)  02/16/16 162 lb 1.6 oz (73.5 kg)  12/12/15 158 lb 9.6 oz (71.9 kg)   No pneumovax, no prevnar 13, no Zostavax.  Non-hodgkin's lymphoma: followed every 2 months; onset 2009.  [facility]. Last scan 12/2015.  Every six months scans.  CAD/s/p AMI in 2012: followed every 2 years. Stenting in 2013.  Last visit in 08/20/2014.    Review of Systems  Constitutional: Negative for activity change, appetite change, chills, diaphoresis, fatigue and fever.  Respiratory: Negative for cough and shortness of breath.   Cardiovascular: Negative for chest pain, palpitations and leg swelling.  Gastrointestinal: Negative for abdominal pain, diarrhea, nausea and vomiting.  Endocrine: Negative for cold intolerance, heat intolerance, polydipsia, polyphagia and polyuria.  Genitourinary:       Nocturia x 0.  Strong urinary stream.  Normal sex drive; wife's is down.    Skin: Negative for color change, rash and wound.  Neurological: Negative for dizziness, tremors, seizures, syncope, facial asymmetry, speech difficulty, weakness, light-headedness, numbness and headaches.  Psychiatric/Behavioral: Positive for sleep disturbance. Negative for dysphoric mood. The patient is  not nervous/anxious.     Past Medical History:  Diagnosis Date  . Cancer ([place])   . Coronary atherosclerosis of native coronary artery   . Encounter for antineoplastic chemotherapy 06/20/2015  . GERD (gastroesophageal reflux disease)   . History of heart attack   . Hyperlipidemia   . Myocardial infarction   . Non Hodgkin's lymphoma [facility])    Past Surgical History:  Procedure Laterality Date  . CORONARY STENT PLACEMENT    . LEFT HEART CATHETERIZATION WITH CORONARY ANGIOGRAM N/A 03/03/2012   Procedure: LEFT HEART CATHETERIZATION WITH CORONARY ANGIOGRAM;  Surgeon: [name], MD;  Location: [facility] CATH LAB;  Service: Cardiovascular;  Laterality: N/A;  . PERCUTANEOUS CORONARY STENT INTERVENTION (PCI-S) N/A 03/04/2012   Procedure: PERCUTANEOUS CORONARY STENT INTERVENTION (PCI-S);  Surgeon: [name], MD;  Location: [facility] CATH LAB;  Service: Cardiovascular;  Laterality: N/A;   Allergies  Allergen Reactions  . Neosporin [Neomycin-Bacitracin Zn-Polymyx] Anaphylaxis    Social History   Social History  . Marital status: Married    Spouse name: N/A  . Number of children: N/A  . Years of education: N/A   Occupational History  . pharmacist    Social History Main Topics  . Smoking status: Never Smoker  . Smokeless tobacco: Never Used  . Alcohol use Yes     Comment: occasionally  . Drug use: No  . Sexual activity: Yes   Other Topics Concern  . 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       Tobacco: never      Alcohol:  Socially; almost none in 2017      Exercise in door track, cardio machine and treadmill 1-2 times per week      ADLs: independent with ADLs;       Advanced Directives: no;  desires FULL CODE.    Family History  Problem Relation Age of Onset  . Leukemia Mother        Objective:    BP 123/76 (BP Location: Right Arm, Patient Position: Sitting, Cuff Size: Small)   Pulse 73   Temp 97.6 F (36.4 C) (Oral)   Resp 16   Ht '5\' 7"'$  (1.702 m)   Wt 158 lb 12.8 oz (72 kg)   SpO2 98%   BMI 24.87 kg/m  Physical Exam  Constitutional: He is oriented to person, place, and time. He appears well-developed and well-nourished. No distress.  HENT:  Head: Normocephalic and atraumatic.  Right Ear: External ear normal.  Left Ear: External ear normal.  Nose: Nose normal.  Mouth/Throat: Oropharynx is clear and moist.  Eyes: Conjunctivae and EOM are normal. Pupils are equal, round, and reactive to light.  Neck: Normal range of motion. Neck supple. Carotid bruit is not present. No thyromegaly present.  Cardiovascular: Normal rate, regular rhythm, normal heart sounds and intact distal pulses.  Exam reveals no gallop and no friction rub.   No murmur heard. Pulmonary/Chest: Effort normal and breath sounds normal. He has no wheezes. He has no rales.  Abdominal: Soft. Bowel sounds are normal. He exhibits no distension and no mass. There is no tenderness. There is no rebound and no guarding.  Musculoskeletal:       Right shoulder: Normal.       Left shoulder: Normal.       Cervical back: Normal.  Lymphadenopathy:    He has no cervical adenopathy.  Neurological: He is alert and oriented to person, place, and time. He has normal reflexes. No cranial nerve deficit. He exhibits normal muscle tone. Coordination normal.  Skin: Skin is warm and dry. No rash noted. He is not diaphoretic.  Psychiatric: He has a normal mood and affect. His behavior is normal. Judgment and thought content normal.    Depression screen [facility] 2/9 02/29/2016 02/29/2016 11/24/2015 01/05/2015 06/25/2012  Decreased Interest 0 0 0 0 0  Down, Depressed, Hopeless 0 0 0 0 0  PHQ - 2 Score 0 0 0 0 0   Fall Risk   02/29/2016 02/29/2016 11/24/2015 01/05/2015 02/09/2014  Falls in the past year? No No No No No  Number falls in past yr: - - - - -  Injury with Fall? - - - - -   Functional Status Survey: Is the patient deaf or have difficulty hearing?: No Does the patient have difficulty seeing, even when wearing glasses/contacts?: No Does the patient have difficulty concentrating, remembering, or making decisions?: No Does the patient have difficulty walking or climbing stairs?: No Does the patient have difficulty dressing or bathing?: No Does the patient have difficulty doing errands alone such as visiting a doctor's office or shopping?: No     Assessment & Plan:   1. Encounter for Medicare annual wellness exam   2. CAD in native artery   3. Non-STEMI (non-ST elevated myocardial infarction) ([place])   4. Sensorineural hearing  loss (SNHL) of both ears   5. Grade 1 follicular lymphoma of lymph nodes of multiple regions ([place])   6. Erectile dysfunction, unspecified erectile dysfunction type   7. Insomnia, idiopathic   8. Hypercholesterolemia   9. Screening for prostate cancer   10. Screening for diabetes mellitus   11. Colon cancer screening   12. Need for pneumococcal vaccination    -anticipatory guidance. -independent with ADLs; no evidence of depression/anxiety.  Low fall risk. -refer to GI for colonoscopy -obtain PSA.  Discussed [place] Preventative Task Force recommendations against prostate cancer screening; pt agreeable to obtaining PSA today and not undergoing prostate exam. -s/p Prevnar 13.  Will warrant Pneumovax next year. -obtain labs for chronic disease states. -recommend follow-up with cardiology for annual follow-up; will assist with scheduling appointment. -rx for Trazodone provided for insomnia.   Orders Placed This Encounter  Procedures  . Pneumococcal conjugate vaccine 13-valent IM  . CBC with Differential/Platelet  . Comprehensive metabolic panel    Order Specific Question:   Has  the patient fasted?    Answer:   Yes  . Lipid panel    Order Specific Question:   Has the patient fasted?    Answer:   Yes  . PSA  . Hemoglobin A1c  . Ambulatory referral to Cardiology    Referral Priority:   Routine    Referral Type:   Consultation    Referral Reason:   Specialty Services Required    Requested Specialty:   Cardiology    Number of Visits Requested:   1  . Ambulatory referral to Gastroenterology    Referral Priority:   Routine    Referral Type:   Consultation    Referral Reason:   Specialty Services Required    Number of Visits Requested:   1  . POCT urinalysis dipstick   Meds ordered this encounter  Medications  . traZODone (DESYREL) 50 MG tablet    Sig: Take 1-2 tablets (50-100 mg total) by mouth at bedtime as needed for sleep.    Dispense:  180 tablet    Refill:  3    Return in about 1 year (around 02/28/2017) for complete physical examiniation.   Jeena Arnett Elayne Guerin, M.D. [facility] [address] [place], [place]  [address] [phone] phone [phone] fax

## 2016-02-29 NOTE — Patient Instructions (Addendum)
   IF you received an x-ray today, you will receive an invoice from [facility]. Please contact  Radiology at [phone] with questions or concerns regarding your invoice.   IF you received labwork today, you will receive an invoice from [facility]. Please contact [facility] at [phone] with questions or concerns regarding your invoice.   Our billing staff will not be able to assist you with questions regarding bills from these companies.  You will be contacted with the lab results as soon as they are available. The fastest way to get your results is to activate your My Chart account. Instructions are located on the last page of this paperwork. If you have not heard from us regarding the results in 2 weeks, please contact this office.     Keeping you healthy  Get these tests  Blood pressure- Have your blood pressure checked once a year by your healthcare provider.  Normal blood pressure is 120/80  Weight- Have your body mass index (BMI) calculated to screen for obesity.  BMI is a measure of body fat based on height and weight. You can also calculate your own BMI at [email or web address].com/bmi/.  Cholesterol- Have your cholesterol checked every year.  Diabetes- Have your blood sugar checked regularly if you have high blood pressure, high cholesterol, have a family history of diabetes or if you are overweight.  Screening for Colon Cancer- Colonoscopy starting at age 50.  Screening may begin sooner depending on your family history and other health conditions. Follow up colonoscopy as directed by your Gastroenterologist.  Screening for Prostate Cancer- Both blood work (PSA) and a rectal exam help screen for Prostate Cancer.  Screening begins at age 40 with African-American men and at age 50 with Caucasian men.  Screening may begin sooner depending on your family history.  Take these medicines  Aspirin- One aspirin daily can help prevent Heart disease and Stroke.  Flu  shot- Every fall.  Tetanus- Every 10 years.  Zostavax- Once after the age of 60 to prevent Shingles.  Pneumonia shot- Once after the age of 65; if you are younger than 65, ask your healthcare provider if you need a Pneumonia shot.  Take these steps  Don't smoke- If you do smoke, talk to your doctor about quitting.  For tips on how to quit, go to www.smokefree.gov or call 1-800-QUIT-NOW.  Be physically active- Exercise 5 days a week for at least 30 minutes.  If you are not already physically active start slow and gradually work up to 30 minutes of moderate physical activity.  Examples of moderate activity include walking briskly, mowing the yard, dancing, swimming, bicycling, etc.  Eat a healthy diet- Eat a variety of healthy food such as fruits, vegetables, low fat milk, low fat cheese, yogurt, lean meant, poultry, fish, beans, tofu, etc. For more information go to www.thenutritionsource.org  Drink alcohol in moderation- Limit alcohol intake to less than two drinks a day. Never drink and drive.  Dentist- Brush and floss twice daily; visit your dentist twice a year.  Depression- Your emotional health is as important as your physical health. If you're feeling down, or losing interest in things you would normally enjoy please talk to your healthcare provider.  Eye exam- Visit your eye doctor every year.  Safe sex- If you may be exposed to a sexually transmitted infection, use a condom.  Seat belts- Seat belts can save your life; always wear one.  Smoke/Carbon Monoxide detectors- These detectors need to be installed on the appropriate   level of your home.  Replace batteries at least once a year.  Skin cancer- When out in the sun, cover up and use sunscreen 15 SPF or higher.  Violence- If anyone is threatening you, please tell your healthcare provider. Living Will/ Health care power of attorney- Speak with your healthcare provider and family.Advance Directive Advance directives are the  legal documents that allow you to make choices about your health care and medical treatment if you cannot speak for yourself. Advance directives are a way for you to communicate your wishes to family, friends, and health care providers. The specified people can then convey your decisions about end-of-life care to avoid confusion if you should become unable to communicate. Ideally, the process of discussing and writing advance directives should happen over time rather than making decisions all at once. Advance directives can be modified as your situation changes, and you can change your mind at any time, even after you have signed the advance directives. Each state has its own laws regarding advance directives. You may want to check with your health care provider, attorney, or state representative about the law in your state. Below are some examples of advance directives. HEALTH CARE PROXY AND DURABLE POWER OF ATTORNEY FOR HEALTH CARE A health care proxy is a person (agent) appointed to make medical decisions for you if you cannot. Generally, people choose someone they know well and trust to represent their preferences when they can no longer do so. You should be sure to ask this person for agreement to act as your agent. An agent may have to exercise judgment in the event of a medical decision for which your wishes are not known. A durable power of attorney for health care is a legal document that names your health care proxy. Depending on the laws in your state, after the document is written, it may also need to be:  Signed.  Notarized.  Dated.  Copied.  Witnessed.  Incorporated into your medical record. You may also want to appoint someone to manage your financial affairs if you cannot. This is called a durable power of attorney for finances. It is a separate legal document from the durable power of attorney for health care. You may choose the same person or someone different from your health  care proxy to act as your agent in financial matters. LIVING WILL A living will is a set of instructions documenting your wishes about medical care when you cannot care for yourself. It is used if you become:  Terminally ill.  Incapacitated.  Unable to communicate.  Unable to make decisions. Items to consider in your living will include:  The use or non-use of life-sustaining equipment, such as dialysis machines and breathing machines (ventilators).  A do not resuscitate (DNR) order, which is the instruction not to use cardiopulmonary resuscitation (CPR) if breathing or heartbeat stops.  Tube feeding.  Withholding of food and fluids.  Comfort (palliative) care when the goal becomes comfort rather than a cure.  Organ and tissue donation. A living will does not give instructions about distribution of your money and property if you should pass away. It is advisable to seek the expert advice of a lawyer in drawing up a will regarding your possessions. Decisions about taxes, beneficiaries, and asset distribution will be legally binding. This process can relieve your family and friends of any burdens surrounding disputes or questions that may come up about the allocation of your assets. DO NOT RESUSCITATE (DNR) A do not resuscitate (  DNR) order is a request to not have CPR in the event that your heart stops beating or you stop breathing. Unless given other instructions, a health care provider will try to help any patient whose heart has stopped or who has stopped breathing.  This information is not intended to replace advice given to you by your health care provider. Make sure you discuss any questions you have with your health care provider. Document Released: 05/29/2007 Document Revised: 06/13/2015 Document Reviewed: 07/09/2012 Elsevier Interactive Patient Education  2017 [employer].

## 2016-03-01 LAB — COMPREHENSIVE METABOLIC PANEL
A/G RATIO: 2 (ref 1.2–2.2)
ALBUMIN: 4.3 g/dL (ref 3.5–4.8)
ALT: 20 IU/L (ref 0–44)
AST: 28 IU/L (ref 0–40)
Alkaline Phosphatase: 146 IU/L — ABNORMAL HIGH (ref 39–117)
BILIRUBIN TOTAL: 0.4 mg/dL (ref 0.0–1.2)
BUN / CREAT RATIO: 13 (ref 10–24)
BUN: 14 mg/dL (ref 8–27)
CO2: 27 mmol/L (ref 18–29)
Calcium: 9.7 mg/dL (ref 8.6–10.2)
Chloride: 98 mmol/L (ref 96–106)
Creatinine, Ser: 1.09 mg/dL (ref 0.76–1.27)
GFR calc Af Amer: 76 mL/min/{1.73_m2} (ref >59)
GFR, EST NON AFRICAN AMERICAN: 66 mL/min/{1.73_m2} (ref >59)
Globulin, Total: 2.2 g/dL (ref 1.5–4.5)
Glucose: 71 mg/dL (ref 65–99)
POTASSIUM: 5.4 mmol/L — AB (ref 3.5–5.2)
Sodium: 140 mmol/L (ref 134–144)
TOTAL PROTEIN: 6.5 g/dL (ref 6.0–8.5)

## 2016-03-01 LAB — CBC WITH DIFFERENTIAL/PLATELET
BASOS: 1 %
Basophils Absolute: 0 10*3/uL (ref 0.0–0.2)
EOS (ABSOLUTE): 0.3 10*3/uL (ref 0.0–0.4)
EOS: 5 %
HEMATOCRIT: 41.3 % (ref 37.5–51.0)
HEMOGLOBIN: 14.3 g/dL (ref 13.0–17.7)
IMMATURE GRANS (ABS): 0.1 10*3/uL (ref 0.0–0.1)
IMMATURE GRANULOCYTES: 1 %
LYMPHS: 30 %
Lymphocytes Absolute: 1.7 10*3/uL (ref 0.7–3.1)
MCH: 28.6 pg (ref 26.6–33.0)
MCHC: 34.6 g/dL (ref 31.5–35.7)
MCV: 83 fL (ref 79–97)
MONOCYTES: 17 %
Monocytes Absolute: 0.9 10*3/uL (ref 0.1–0.9)
NEUTROS ABS: 2.6 10*3/uL (ref 1.4–7.0)
NEUTROS PCT: 46 %
PLATELETS: 326 10*3/uL (ref 150–379)
RBC: 5 x10E6/uL (ref 4.14–5.80)
RDW: 15.2 % (ref 12.3–15.4)
WBC: 5.6 10*3/uL (ref 3.4–10.8)

## 2016-03-01 LAB — PSA: PROSTATE SPECIFIC AG, SERUM: 64.1 ng/mL — AB (ref 0.0–4.0)

## 2016-03-01 LAB — LIPID PANEL
CHOL/HDL RATIO: 4.2 ratio (ref 0.0–5.0)
Cholesterol, Total: 187 mg/dL (ref 100–199)
HDL: 45 mg/dL (ref >39)
LDL Calculated: 125 mg/dL — ABNORMAL HIGH (ref 0–99)
Triglycerides: 84 mg/dL (ref 0–149)
VLDL CHOLESTEROL CAL: 17 mg/dL (ref 5–40)

## 2016-03-01 LAB — HEMOGLOBIN A1C
Est. average glucose Bld gHb Est-mCnc: 111 mg/dL
HEMOGLOBIN A1C: 5.5 % (ref 4.8–5.6)

## 2016-03-25 DIAGNOSIS — E7800 Pure hypercholesterolemia, unspecified: Secondary | ICD-10-CM | POA: Insufficient documentation

## 2016-04-02 NOTE — Addendum Note (Signed)
Addended by: Wardell Honour on: 04/02/2016 11:29 AM   Modules accepted: Orders

## 2016-04-03 ENCOUNTER — Telehealth: Payer: Self-pay | Admitting: Emergency Medicine

## 2016-04-03 DIAGNOSIS — I2510 Atherosclerotic heart disease of native coronary artery without angina pectoris: Secondary | ICD-10-CM

## 2016-04-03 MED ORDER — ATORVASTATIN CALCIUM 40 MG PO TABS: 40.0000 mg | ORAL_TABLET | Freq: Every day | ORAL | 3 refills | Status: DC

## 2016-04-03 NOTE — Telephone Encounter (Signed)
-----   Message from Wardell Honour, MD sent at 04/02/2016 11:27 AM EST ----- I left a message/voicemail with patient today; I advised him that I would leave him a MyChart message; however, please attempt to call him again with these results ----  Your PSA level (prostate) is very elevated at 64.  Are you having any recent changes with urination like burning with urination, increased frequency of urination, or urgency to urinate?  I recommend a consultation with a urologist to evaluate further.  I will place a referral for this consultation. No evidence of anemia. Potassium level is borderline high at 5.4; I recommend increasing water intake each day. Kidney function is normal.  Cholesterol is much higher than one year ago and I would like to see the LDL cholesterol less than 100. Sugar/glucose is normal; there is no evidence of diabetes.

## 2016-04-06 ENCOUNTER — Encounter: Payer: Self-pay | Admitting: Family Medicine

## 2016-04-17 ENCOUNTER — Other Ambulatory Visit: Payer: Self-pay | Admitting: Medical Oncology

## 2016-04-17 DIAGNOSIS — C8208 Follicular lymphoma grade I, lymph nodes of multiple sites: Secondary | ICD-10-CM

## 2016-04-18 ENCOUNTER — Encounter: Payer: Self-pay | Admitting: Internal Medicine

## 2016-04-18 ENCOUNTER — Telehealth: Payer: Self-pay | Admitting: Internal Medicine

## 2016-04-18 ENCOUNTER — Other Ambulatory Visit (HOSPITAL_BASED_OUTPATIENT_CLINIC_OR_DEPARTMENT_OTHER): Payer: Medicare Other

## 2016-04-18 ENCOUNTER — Ambulatory Visit (HOSPITAL_BASED_OUTPATIENT_CLINIC_OR_DEPARTMENT_OTHER): Payer: Medicare Other | Admitting: Internal Medicine

## 2016-04-18 ENCOUNTER — Ambulatory Visit (HOSPITAL_BASED_OUTPATIENT_CLINIC_OR_DEPARTMENT_OTHER): Payer: Medicare Other

## 2016-04-18 VITALS — BP 142/92 | HR 75 | Temp 97.9°F | Resp 18 | Ht 67.0 in | Wt 156.5 lb

## 2016-04-18 VITALS — BP 110/72 | HR 72 | Temp 97.9°F | Resp 18

## 2016-04-18 DIAGNOSIS — C8208 Follicular lymphoma grade I, lymph nodes of multiple sites: Secondary | ICD-10-CM

## 2016-04-18 DIAGNOSIS — Z5112 Encounter for antineoplastic immunotherapy: Secondary | ICD-10-CM

## 2016-04-18 LAB — COMPREHENSIVE METABOLIC PANEL
ALT: 22 U/L (ref 0–55)
ANION GAP: 10 meq/L (ref 3–11)
AST: 24 U/L (ref 5–34)
Albumin: 3.1 g/dL — ABNORMAL LOW (ref 3.5–5.0)
Alkaline Phosphatase: 140 U/L (ref 40–150)
BUN: 14.3 mg/dL (ref 7.0–26.0)
CALCIUM: 9.6 mg/dL (ref 8.4–10.4)
CHLORIDE: 105 meq/L (ref 98–109)
CO2: 26 mEq/L (ref 22–29)
CREATININE: 0.9 mg/dL (ref 0.7–1.3)
EGFR: 79 mL/min/{1.73_m2} — ABNORMAL LOW (ref >90)
Glucose: 81 mg/dl (ref 70–140)
Potassium: 4.5 mEq/L (ref 3.5–5.1)
Sodium: 141 mEq/L (ref 136–145)
Total Bilirubin: 0.34 mg/dL (ref 0.20–1.20)
Total Protein: 6.4 g/dL (ref 6.4–8.3)

## 2016-04-18 LAB — CBC WITH DIFFERENTIAL/PLATELET
BASO%: 0.5 % (ref 0.0–2.0)
BASOS ABS: 0 10*3/uL (ref 0.0–0.1)
EOS%: 2.1 % (ref 0.0–7.0)
Eosinophils Absolute: 0.1 10*3/uL (ref 0.0–0.5)
HEMATOCRIT: 39.3 % (ref 38.4–49.9)
HGB: 12.9 g/dL — ABNORMAL LOW (ref 13.0–17.1)
LYMPH#: 1.4 10*3/uL (ref 0.9–3.3)
LYMPH%: 22.6 % (ref 14.0–49.0)
MCH: 27.6 pg (ref 27.2–33.4)
MCHC: 32.8 g/dL (ref 32.0–36.0)
MCV: 84 fL (ref 79.3–98.0)
MONO#: 1 10*3/uL — AB (ref 0.1–0.9)
MONO%: 15 % — ABNORMAL HIGH (ref 0.0–14.0)
NEUT#: 3.8 10*3/uL (ref 1.5–6.5)
NEUT%: 59.8 % (ref 39.0–75.0)
PLATELETS: 311 10*3/uL (ref 140–400)
RBC: 4.68 10*6/uL (ref 4.20–5.82)
RDW: 14.7 % — ABNORMAL HIGH (ref 11.0–14.6)
WBC: 6.3 10*3/uL (ref 4.0–10.3)

## 2016-04-18 MED ORDER — DIPHENHYDRAMINE HCL 25 MG PO CAPS
50.0000 mg | ORAL_CAPSULE | Freq: Once | ORAL | Status: AC
Start: 2016-04-18 — End: 2016-04-18
  Administered 2016-04-18: 50 mg via ORAL

## 2016-04-18 MED ORDER — ACETAMINOPHEN 325 MG PO TABS
ORAL_TABLET | ORAL | Status: AC
  Filled 2016-04-18: qty 2

## 2016-04-18 MED ORDER — ACETAMINOPHEN 325 MG PO TABS
650.0000 mg | ORAL_TABLET | Freq: Once | ORAL | Status: AC
  Administered 2016-04-18: 650 mg via ORAL

## 2016-04-18 MED ORDER — DIPHENHYDRAMINE HCL 25 MG PO CAPS
ORAL_CAPSULE | ORAL | Status: AC
  Filled 2016-04-18: qty 2

## 2016-04-18 MED ORDER — SODIUM CHLORIDE 0.9 % IV SOLN
375.0000 mg/m2 | Freq: Once | INTRAVENOUS | Status: AC
  Administered 2016-04-18: 700 mg via INTRAVENOUS
  Filled 2016-04-18: qty 50

## 2016-04-18 MED ORDER — SODIUM CHLORIDE 0.9 % IV SOLN
Freq: Once | INTRAVENOUS | Status: AC
  Administered 2016-04-18: 11:00:00 via INTRAVENOUS

## 2016-04-18 NOTE — Progress Notes (Signed)
[place] [phone]   [phone]  OFFICE PROGRESS NOTE  DIAGNOSIS: Bulky stage IV low-grade lymphoma diagnosed in November 2009.  PRIOR THERAPY: 1. Status post 7 cycles of systemic chemotherapy with CHOP/Rituxan. Last dose was given May 2010. 2. Status post 6 cycles of maintenance Rituxan 375 mg/sq m given every 2 months. Last dose was given on May 29, 2010, and the patient was lost to followup at that time. He received another dose on 12/11/2010, then again missed 2 doses. 3. systemic chemotherapy with Rituxan 375 mg/M2 on day 1 and that bendamustine 90 mg/M2 on days 1 and 2 every 4 weeks, status post 6 cycles. 4. Maintenance Rituxan 375 mg/M2 every 2 months, status post 12 cycles 5.  CURRENT THERAPY: Rituxan 375 MG/M2 given weekly for 4 weeks and this is followed by maintenance treatment every 2 months, status post 11 cycles.  INTERVAL HISTORY: Eric [name] 76 y.o. male came to the clinic today for two-month follow-up visit. The patient is currently undergoing systemic treatment with Rituxan status post 11 cycles. He is tolerating this treatment well with no significant adverse effects. He denied having any significant weight loss or night sweats. He has no nausea, vomiting, diarrhea or constipation. He has no fever or chills. He denied having any pelvic lymphadenopathy. He mentioned that he received a letter from his insurance denying his last treatment in October. He is here today for evaluation before starting cycle #12.  MEDICAL HISTORY: Past Medical History:  Diagnosis Date  . Cancer ([place])   . Coronary atherosclerosis of native coronary artery   . Encounter for antineoplastic chemotherapy 06/20/2015  . GERD (gastroesophageal reflux disease)   . History of heart attack   . Hyperlipidemia   . Myocardial infarction   . Non Hodgkin's lymphoma ([place])     ALLERGIES:  is allergic to neosporin [neomycin-bacitracin  zn-polymyx].  MEDICATIONS:  Current Outpatient Prescriptions  Medication Sig Dispense Refill  . aspirin EC 81 MG tablet Take 81 mg by mouth at bedtime.    Marland Kitchen atorvastatin (LIPITOR) 40 MG tablet Take 1 tablet (40 mg total) by mouth daily. 90 tablet 3  . diphenhydrAMINE (BENADRYL) 25 MG tablet Take 25 mg by mouth at bedtime as needed for sleep.     . Guaifenesin (MUCINEX MAXIMUM STRENGTH) 1200 MG TB12 Take 1 tablet (1,200 mg total) by mouth every 12 (twelve) hours as needed. 14 tablet 1  . ipratropium (ATROVENT) 0.03 % nasal spray Place 2 sprays into both nostrils 2 (two) times daily. 30 mL 0  . LORazepam (ATIVAN) 0.5 MG tablet Take 1 tablet (0.5 mg total) by mouth at bedtime as needed. for anxiety 30 tablet 0  . nitroGLYCERIN (NITROSTAT) 0.4 MG SL tablet Place 1 tablet (0.4 mg total) under the tongue every 5 (five) minutes x 3 doses as needed for chest pain. 25 tablet 3  . omeprazole (PRILOSEC) 20 MG capsule Take 20 mg by mouth daily as needed (acid relfux).    . sildenafil (VIAGRA) 25 MG tablet Take 1 tablet (25 mg total) by mouth daily as needed. Take 2-3 tablets by mouth once daily as needed. 90 tablet 6  . traZODone (DESYREL) 50 MG tablet Take 1-2 tablets (50-100 mg total) by mouth at bedtime as needed for sleep. 180 tablet 3   No current facility-administered medications for this visit.     SURGICAL HISTORY:  Past Surgical History:  Procedure Laterality Date  . CORONARY STENT PLACEMENT    .  LEFT HEART CATHETERIZATION WITH CORONARY ANGIOGRAM N/A 03/03/2012   Procedure: LEFT HEART CATHETERIZATION WITH CORONARY ANGIOGRAM;  Surgeon: [name], MD;  Location[facility] C[facility] LAB;  Service: Cardiovascular;  Laterality: N/A;  . PERCUTANEOUS CORONARY STENT INTERVENTION (PCI-S) N/A 03/04/2012   Procedure: PERCUTANEOUS CORONARY STENT INTERVENTION (PCI-S);  Surgeon: [name], MD;  Location: [facility] CATH LAB;  Service: Cardiovascular;  Laterality: N/A;    REVIEW OF SYSTEMS:  A comprehensive review  of systems was negative.   PHYSICAL EXAMINATION: General appearance: alert, cooperative and no distress Head: Normocephalic, without obvious abnormality, atraumatic Neck: no adenopathy, no JVD, supple, symmetrical, trachea midline and thyroid not enlarged, symmetric, no tenderness/mass/nodules Lymph nodes: Cervical, supraclavicular, and axillary nodes normal. Resp: clear to auscultation bilaterally Back: symmetric, no curvature. ROM normal. No CVA tenderness. Cardio: regular rate and rhythm, S1, S2 normal, no murmur, click, rub or gallop GI: soft, non-tender; bowel sounds normal; no masses,  no organomegaly Extremities: extremities normal, atraumatic, no cyanosis or edema  ECOG PERFORMANCE STATUS: 0 - Asymptomatic  There were no vitals taken for this visit.  LABORATORY DATA: Lab Results  Component Value Date   WBC 6.3 04/18/2016   HGB 12.9 (L) 04/18/2016   HCT 39.3 04/18/2016   MCV 84.0 04/18/2016   PLT 311 04/18/2016      Chemistry      Component Value Date/Time   NA 140 02/29/2016 1613   NA 140 02/16/2016 0956   K 5.4 (H) 02/29/2016 1613   K 4.1 02/16/2016 0956   CL 98 02/29/2016 1613   CL 106 08/06/2012 1244   CO2 27 02/29/2016 1613   CO2 25 02/16/2016 0956   BUN 14 02/29/2016 1613   BUN 13.3 02/16/2016 0956   CREATININE 1.09 02/29/2016 1613   CREATININE 1.0 02/16/2016 0956      Component Value Date/Time   CALCIUM 9.7 02/29/2016 1613   CALCIUM 9.1 02/16/2016 0956   ALKPHOS 146 (H) 02/29/2016 1613   ALKPHOS 146 02/16/2016 0956   AST 28 02/29/2016 1613   AST 42 (H) 02/16/2016 0956   ALT 20 02/29/2016 1613   ALT 42 02/16/2016 0956   BILITOT 0.4 02/29/2016 1613   BILITOT 0.45 02/16/2016 0956       RADIOGRAPHIC STUDIES: No results found.  ASSESSMENT AND PLAN:  This is a very pleasant 76 years old African-American male with bulky stage IV follicular lymphoma that was initially treated with CHOP/Rituxan followed by maintenance Rituxan. He had evidence for  disease recurrence and the patient was started on treatment again with Rituxan every 2 months is status post 11 cycles. He is feeling fine today. I recommended for him to proceed with cycle #12 today as a scheduled. I will see him back for follow-up visit in 2 months for reevaluation with repeat CT scan of the chest, abdomen and pelvis for restaging of his disease. He was advised to call immediately if he has any concerning symptoms in the interval. I'll also refer the patient to the financial advocate office to discuss the denial of his insurance coverage. The patient voices understanding of current disease status and treatment options and is in agreement with the current care plan.  All questions were answered. The patient knows to call the clinic with any problems, questions or concerns. We can certainly see the patient much sooner if necessary. I spent 10 minutes counseling the patient face to face. The total time spent in the appointment was 15 minutes.  Disclaimer: This note was dictated with  voice recognition software. Similar sounding words can inadvertently be transcribed and may not be corrected upon review.

## 2016-04-18 NOTE — Patient Instructions (Signed)
[facility] Discharge Instructions for Patients Receiving Chemotherapy  Today you received the following chemotherapy agents:  Rituxan  To help prevent nausea and vomiting after your treatment, we encourage you to take your nausea medication as ordered per MD.   If you develop nausea and vomiting that is not controlled by your nausea medication, call the clinic.   BELOW ARE SYMPTOMS THAT SHOULD BE REPORTED IMMEDIATELY:  *FEVER GREATER THAN 100.5 F  *CHILLS WITH OR WITHOUT FEVER  NAUSEA AND VOMITING THAT IS NOT CONTROLLED WITH YOUR NAUSEA MEDICATION  *UNUSUAL SHORTNESS OF BREATH  *UNUSUAL BRUISING OR BLEEDING  TENDERNESS IN MOUTH AND THROAT WITH OR WITHOUT PRESENCE OF ULCERS  *URINARY PROBLEMS  *BOWEL PROBLEMS  UNUSUAL RASH Items with * indicate a potential emergency and should be followed up as soon as possible.  Feel free to call the clinic you have any questions or concerns. The clinic phone number is [phone].  Please show the [place] at check-in to the Emergency Department and triage nurse.

## 2016-04-18 NOTE — Telephone Encounter (Signed)
Appointments scheduled per 2/14 LOS. Patient given AVS report and calendars with future scheduled appointments. Patient also given two bottles of contrast and instructions for CT scan appointments.

## 2016-05-17 ENCOUNTER — Encounter: Payer: Self-pay | Admitting: Internal Medicine

## 2016-05-17 NOTE — Progress Notes (Signed)
Pt is approved w/ [employer] Access to [employer] to receive Rituxan at no charge for one year or until therapy is discontinued.

## 2016-05-18 DIAGNOSIS — N401 Enlarged prostate with lower urinary tract symptoms: Secondary | ICD-10-CM | POA: Diagnosis not present

## 2016-05-18 DIAGNOSIS — R319 Hematuria, unspecified: Secondary | ICD-10-CM | POA: Diagnosis not present

## 2016-05-18 DIAGNOSIS — N138 Other obstructive and reflux uropathy: Secondary | ICD-10-CM | POA: Diagnosis not present

## 2016-05-18 DIAGNOSIS — R972 Elevated prostate specific antigen [PSA]: Secondary | ICD-10-CM | POA: Diagnosis not present

## 2016-05-20 NOTE — Progress Notes (Signed)
Cardiology Office Note    Date:  05/21/2016   ID:  Eric [name], DOB [birth year], MRN [id number]  PCP:  Eric Forts, MD  Cardiologist: [name], MD   Chief Complaint  Patient presents with  . Coronary Artery Disease    History of Present Illness:  Eric [name] is a 76 y.o. male who presents for f/u of  CAD with RCA DES 2013, and hyperlipidemia.  He is doing well. Physically active without difficulty. No recurrence of chest discomfort. No medication side effects. He denies lower extremity swelling.  Past Medical History:  Diagnosis Date  . Cancer ([place])   . Coronary atherosclerosis of native coronary artery   . Encounter for antineoplastic chemotherapy 06/20/2015  . GERD (gastroesophageal reflux disease)   . History of heart attack   . Hyperlipidemia   . Myocardial infarction   . Non Hodgkin's lymphoma [facility])     Past Surgical History:  Procedure Laterality Date  . CORONARY STENT PLACEMENT    . LEFT HEART CATHETERIZATION WITH CORONARY ANGIOGRAM N/A 03/03/2012   Procedure: LEFT HEART CATHETERIZATION WITH CORONARY ANGIOGRAM;  Surgeon: [name], MD;  Location: [facility] CATH LAB;  Service: Cardiovascular;  Laterality: N/A;  . PERCUTANEOUS CORONARY STENT INTERVENTION (PCI-S) N/A 03/04/2012   Procedure: PERCUTANEOUS CORONARY STENT INTERVENTION (PCI-S);  Surgeon: [name], MD;  Location: [facility] CATH LAB;  Service: Cardiovascular;  Laterality: N/A;    Current Medications: Outpatient Medications Prior to Visit  Medication Sig Dispense Refill  . aspirin EC 81 MG tablet Take 81 mg by mouth at bedtime.    Marland Kitchen atorvastatin (LIPITOR) 40 MG tablet Take 1 tablet (40 mg total) by mouth daily. 90 tablet 3  . diphenhydrAMINE (BENADRYL) 25 MG tablet Take 25 mg by mouth at bedtime as needed for sleep.     Marland Kitchen LORazepam (ATIVAN) 0.5 MG tablet Take 1 tablet (0.5 mg total) by mouth at bedtime as needed. for anxiety 30 tablet 0  . nitroGLYCERIN (NITROSTAT) 0.4 MG SL tablet Place 1  tablet (0.4 mg total) under the tongue every 5 (five) minutes x 3 doses as needed for chest pain. 25 tablet 3  . omeprazole (PRILOSEC) 20 MG capsule Take 20 mg by mouth daily as needed (acid relfux).    . sildenafil (VIAGRA) 25 MG tablet Take 1 tablet (25 mg total) by mouth daily as needed. Take 2-3 tablets by mouth once daily as needed. 90 tablet 6  . Guaifenesin (MUCINEX MAXIMUM STRENGTH) 1200 MG TB12 Take 1 tablet (1,200 mg total) by mouth every 12 (twelve) hours as needed. (Patient not taking: Reported on 05/21/2016) 14 tablet 1  . ipratropium (ATROVENT) 0.03 % nasal spray Place 2 sprays into both nostrils 2 (two) times daily. (Patient not taking: Reported on 05/21/2016) 30 mL 0  . traZODone (DESYREL) 50 MG tablet Take 1-2 tablets (50-100 mg total) by mouth at bedtime as needed for sleep. (Patient not taking: Reported on 05/21/2016) 180 tablet 3   No facility-administered medications prior to visit.      Allergies:   Neosporin [neomycin-bacitracin zn-polymyx]   Social History   Social History  . Marital status: Married    Spouse name: N/A  . Number of children: N/A  . Years of education: N/A   Occupational History  . pharmacist    Social History Main Topics  . Smoking status: Never Smoker  . Smokeless tobacco: Never Used  . Alcohol use Yes     Comment: occasionally  . Drug use: No  .  Sexual activity: Yes   Other Topics Concern  . 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       Tobacco: never      Alcohol:  Socially; almost none in 2017      Exercise in door track, cardio machine and treadmill 1-2 times per week      ADLs: independent with ADLs;       Advanced Directives: no; desires FULL CODE.       Family History:  The patient's family history includes Leukemia in his mother.   ROS:   Please see the history of present illness.    Hearing loss, snoring, dependent edema.  All other systems reviewed and are negative.   PHYSICAL EXAM:   VS:  BP (!) 146/84 (BP Location: Right Arm)   Pulse 66   Ht '5\' 7"'$  (1.702 m)   Wt 158 lb 9.6 oz (71.9 kg)   BMI 24.84 kg/m    GEN: Well nourished, well developed, in no acute distress  HEENT: normal  Neck: no JVD, carotid bruits, or masses Cardiac: RRR; no murmurs, rubs, or gallops,no edema  Respiratory:  clear to auscultation bilaterally, normal work of breathing GI: soft, nontender, nondistended, + BS MS: no deformity or atrophy  Skin: warm and dry, no rash Neuro:  Alert and Oriented x 3, Strength and sensation are intact Psych: euthymic mood, full affect  Wt Readings from Last 3 Encounters:  05/21/16 158 lb 9.6 oz (71.9 kg)  04/18/16 156 lb 8 oz (71 kg)  02/29/16 158 lb 12.8 oz (72 kg)      Studies/Labs Reviewed:   EKG:  EKG  Normal sinus rhythm with normal appearance.  Recent Labs: 04/18/2016: ALT 22; BUN 14.3; Creatinine 0.9; HGB 12.9; Platelets 311; Potassium 4.5; Sodium 141   Lipid Panel    Component Value Date/Time   CHOL 187 02/29/2016 1613   TRIG 84 02/29/2016 1613   HDL 45 02/29/2016 1613   CHOLHDL 4.2 02/29/2016 1613   CHOLHDL 3.0 01/05/2015 1440   VLDL 15 01/05/2015 1440   LDLCALC 125 (H) 02/29/2016 1613    Additional studies/ records that were reviewed today include:  No new data    ASSESSMENT:    1. CAD in native artery   2. Mixed hyperlipidemia   3. Erectile dysfunction, unspecified erectile dysfunction type   4. Remote non-ST elevation MI      PLAN:  In order of problems listed above:  1. Stay physically active and notify us if episodes of chest discomfort. 2. Lipids are being followed by primary care. LDL target is less than 70. 3. Discussed interaction between nitroglycerin and PDE 5  inhibitor therapy. No contraindication otherwise.  Maintaining an active lifestyle. Call of chest discomfort. Clinical follow-up in one year.    Medication Adjustments/Labs and Tests Ordered: Current medicines are reviewed at length with the patient today.  Concerns regarding medicines are outlined above.  Medication changes, Labs and Tests ordered today are listed in the Patient Instructions below. Patient Instructions  Medication Instructions:  None  Labwork: None  Testing/Procedures: None  Follow-Up: Your physician wants you to follow-up in: 1 year with Dr. Tamala Julian.  You will  receive a reminder letter in the mail two months in advance. If you don't receive a letter, please call our office to schedule the follow-up appointment.   Any Other Special Instructions Will Be Listed Below (If Applicable).     If you need a refill on your cardiac medications before your next appointment, please call your pharmacy.      Signed, [name], MD  05/21/2016 10:49 AM    [facility] [address], [place], [place]  [address] Phone: [phone]; Fax: [phone]

## 2016-05-21 ENCOUNTER — Encounter: Payer: Self-pay | Admitting: Interventional Cardiology

## 2016-05-21 ENCOUNTER — Ambulatory Visit (INDEPENDENT_AMBULATORY_CARE_PROVIDER_SITE_OTHER): Payer: Medicare Other | Admitting: Interventional Cardiology

## 2016-05-21 VITALS — BP 146/84 | HR 66 | Ht 67.0 in | Wt 158.6 lb

## 2016-05-21 DIAGNOSIS — I252 Old myocardial infarction: Secondary | ICD-10-CM

## 2016-05-21 DIAGNOSIS — I2510 Atherosclerotic heart disease of native coronary artery without angina pectoris: Secondary | ICD-10-CM | POA: Diagnosis not present

## 2016-05-21 DIAGNOSIS — E782 Mixed hyperlipidemia: Secondary | ICD-10-CM

## 2016-05-21 DIAGNOSIS — N529 Male erectile dysfunction, unspecified: Secondary | ICD-10-CM

## 2016-05-21 NOTE — Patient Instructions (Signed)

## 2016-06-13 ENCOUNTER — Telehealth: Payer: Self-pay | Admitting: Internal Medicine

## 2016-06-13 ENCOUNTER — Ambulatory Visit (HOSPITAL_BASED_OUTPATIENT_CLINIC_OR_DEPARTMENT_OTHER): Payer: Medicare Other | Admitting: Internal Medicine

## 2016-06-13 ENCOUNTER — Ambulatory Visit (HOSPITAL_BASED_OUTPATIENT_CLINIC_OR_DEPARTMENT_OTHER): Payer: Medicare Other

## 2016-06-13 ENCOUNTER — Other Ambulatory Visit (HOSPITAL_BASED_OUTPATIENT_CLINIC_OR_DEPARTMENT_OTHER): Payer: Medicare Other

## 2016-06-13 ENCOUNTER — Encounter: Payer: Self-pay | Admitting: Internal Medicine

## 2016-06-13 VITALS — BP 143/80 | HR 70 | Temp 98.4°F | Resp 18 | Ht 67.0 in | Wt 162.6 lb

## 2016-06-13 DIAGNOSIS — Z5112 Encounter for antineoplastic immunotherapy: Secondary | ICD-10-CM | POA: Diagnosis not present

## 2016-06-13 DIAGNOSIS — C8208 Follicular lymphoma grade I, lymph nodes of multiple sites: Secondary | ICD-10-CM

## 2016-06-13 DIAGNOSIS — Z5111 Encounter for antineoplastic chemotherapy: Secondary | ICD-10-CM

## 2016-06-13 LAB — CBC WITH DIFFERENTIAL/PLATELET
BASO%: 0.6 % (ref 0.0–2.0)
BASOS ABS: 0 10*3/uL (ref 0.0–0.1)
EOS%: 6.2 % (ref 0.0–7.0)
Eosinophils Absolute: 0.2 10*3/uL (ref 0.0–0.5)
HCT: 37 % — ABNORMAL LOW (ref 38.4–49.9)
HGB: 12.4 g/dL — ABNORMAL LOW (ref 13.0–17.1)
LYMPH%: 32 % (ref 14.0–49.0)
MCH: 28.6 pg (ref 27.2–33.4)
MCHC: 33.5 g/dL (ref 32.0–36.0)
MCV: 85.3 fL (ref 79.3–98.0)
MONO#: 0.6 10*3/uL (ref 0.1–0.9)
MONO%: 18.4 % — ABNORMAL HIGH (ref 0.0–14.0)
NEUT#: 1.4 10*3/uL — ABNORMAL LOW (ref 1.5–6.5)
NEUT%: 42.8 % (ref 39.0–75.0)
NRBC: 0 % (ref 0–0)
Platelets: 197 10*3/uL (ref 140–400)
RBC: 4.34 10*6/uL (ref 4.20–5.82)
RDW: 16.8 % — AB (ref 11.0–14.6)
WBC: 3.4 10*3/uL — AB (ref 4.0–10.3)
lymph#: 1.1 10*3/uL (ref 0.9–3.3)

## 2016-06-13 LAB — COMPREHENSIVE METABOLIC PANEL
ALT: 31 U/L (ref 0–55)
AST: 28 U/L (ref 5–34)
Albumin: 3.5 g/dL (ref 3.5–5.0)
Alkaline Phosphatase: 134 U/L (ref 40–150)
Anion Gap: 10 mEq/L (ref 3–11)
BILIRUBIN TOTAL: 0.44 mg/dL (ref 0.20–1.20)
BUN: 15.8 mg/dL (ref 7.0–26.0)
CO2: 25 meq/L (ref 22–29)
Calcium: 9.1 mg/dL (ref 8.4–10.4)
Chloride: 108 mEq/L (ref 98–109)
Creatinine: 1 mg/dL (ref 0.7–1.3)
EGFR: 77 mL/min/{1.73_m2} — AB (ref >90)
GLUCOSE: 95 mg/dL (ref 70–140)
POTASSIUM: 3.9 meq/L (ref 3.5–5.1)
Sodium: 144 mEq/L (ref 136–145)
TOTAL PROTEIN: 5.7 g/dL — AB (ref 6.4–8.3)

## 2016-06-13 LAB — LACTATE DEHYDROGENASE: LDH: 207 U/L (ref 125–245)

## 2016-06-13 MED ORDER — SODIUM CHLORIDE 0.9 % IV SOLN
Freq: Once | INTRAVENOUS | Status: AC
  Administered 2016-06-13: 10:00:00 via INTRAVENOUS

## 2016-06-13 MED ORDER — ACETAMINOPHEN 325 MG PO TABS
ORAL_TABLET | ORAL | Status: AC
  Filled 2016-06-13: qty 2

## 2016-06-13 MED ORDER — DIPHENHYDRAMINE HCL 25 MG PO CAPS
ORAL_CAPSULE | ORAL | Status: AC
  Filled 2016-06-13: qty 2

## 2016-06-13 MED ORDER — ACETAMINOPHEN 325 MG PO TABS
650.0000 mg | ORAL_TABLET | Freq: Once | ORAL | Status: AC
  Administered 2016-06-13: 650 mg via ORAL

## 2016-06-13 MED ORDER — DIPHENHYDRAMINE HCL 25 MG PO CAPS
50.0000 mg | ORAL_CAPSULE | Freq: Once | ORAL | Status: AC
  Administered 2016-06-13: 50 mg via ORAL

## 2016-06-13 MED ORDER — SODIUM CHLORIDE 0.9 % IV SOLN
375.0000 mg/m2 | Freq: Once | INTRAVENOUS | Status: AC
  Administered 2016-06-13: 700 mg via INTRAVENOUS
  Filled 2016-06-13: qty 50

## 2016-06-13 NOTE — Telephone Encounter (Signed)
Gave patient AVS and calender per 4/11 los - appts already scheduled per 4/11 no additional appts needed. Per MM comments suppose to say follow up in 2 months not to weeks in 4/11 los.

## 2016-06-13 NOTE — Progress Notes (Signed)
[place] Telephone:([phone]   Fax:(336) [phone]  OFFICE PROGRESS NOTE  DIAGNOSIS: Bulky stage IV low-grade lymphoma diagnosed in November 2009.  PRIOR THERAPY: 1. Status post 7 cycles of systemic chemotherapy with CHOP/Rituxan. Last dose was given May 2010. 2. Status post 6 cycles of maintenance Rituxan 375 mg/sq m given every 2 months. Last dose was given on May 29, 2010, and the patient was lost to followup at that time. He received another dose on 12/11/2010, then again missed 2 doses. 3. systemic chemotherapy with Rituxan 375 mg/M2 on day 1 and that bendamustine 90 mg/M2 on days 1 and 2 every 4 weeks, status post 6 cycles. 4. Maintenance Rituxan 375 mg/M2 every 2 months, status post 12 cycles 5.  CURRENT THERAPY: Rituxan 375 MG/M2 given weekly for 4 weeks and this is followed by maintenance treatment every 2 months, status post 12 cycles.  INTERVAL HISTORY: Eric [name] 76 y.o. male returns to the clinic today for follow-up visit. The patient is feeling fine today with no specific complaints. He enjoyed the golf Masters tournament in [place] this weekend. He denied having any weight loss or night sweats. He has no nausea, vomiting, diarrhea or constipation. He has no chest pain, shortness of breath, cough or hemoptysis. He has no fever or chills. He is here today for evaluation before starting cycle #13 of his treatment. He is scheduled to have repeat CT scan of the chest, abdomen and pelvis in 2 days.  MEDICAL HISTORY: Past Medical History:  Diagnosis Date  . Cancer ([place])   . Coronary atherosclerosis of native coronary artery   . Encounter for antineoplastic chemotherapy 06/20/2015  . GERD (gastroesophageal reflux disease)   . History of heart attack   . Hyperlipidemia   . Myocardial infarction   . Non Hodgkin's lymphoma ([place])     ALLERGIES:  is allergic to neosporin [neomycin-bacitracin zn-polymyx].  MEDICATIONS:  Current Outpatient  Prescriptions  Medication Sig Dispense Refill  . aspirin EC 81 MG tablet Take 81 mg by mouth at bedtime.    Marland Kitchen atorvastatin (LIPITOR) 40 MG tablet Take 1 tablet (40 mg total) by mouth daily. 90 tablet 3  . diphenhydrAMINE (BENADRYL) 25 MG tablet Take 25 mg by mouth at bedtime as needed for sleep.     Marland Kitchen ipratropium (ATROVENT) 0.03 % nasal spray Place 2 sprays into both nostrils 2 (two) times daily as needed for rhinitis.    Marland Kitchen LORazepam (ATIVAN) 0.5 MG tablet Take 1 tablet (0.5 mg total) by mouth at bedtime as needed. for anxiety 30 tablet 0  . omeprazole (PRILOSEC) 20 MG capsule Take 20 mg by mouth daily as needed (acid relfux).    . nitroGLYCERIN (NITROSTAT) 0.4 MG SL tablet Place 1 tablet (0.4 mg total) under the tongue every 5 (five) minutes x 3 doses as needed for chest pain. (Patient not taking: Reported on 06/13/2016) 25 tablet 3  . sildenafil (VIAGRA) 25 MG tablet Take 1 tablet (25 mg total) by mouth daily as needed. Take 2-3 tablets by mouth once daily as needed. 90 tablet 6   No current facility-administered medications for this visit.     SURGICAL HISTORY:  Past Surgical History:  Procedure Laterality Date  . CORONARY STENT PLACEMENT    . LEFT HEART CATHETERIZATION WITH CORONARY ANGIOGRAM N/A 03/03/2012   Procedure: LEFT HEART CATHETERIZATION WITH CORONARY ANGIOGRAM;  Surgeon: [name], MD;  Location[facility] C[facility] LAB;  Service: Cardiovascular;  Laterality: N/A;  . PERCUTANEOUS CORONARY  STENT INTERVENTION (PCI-S) N/A 03/04/2012   Procedure: PERCUTANEOUS CORONARY STENT INTERVENTION (PCI-S);  Surgeon: [name], MD;  Location: [facility] CATH LAB;  Service: Cardiovascular;  Laterality: N/A;    REVIEW OF SYSTEMS:  A comprehensive review of systems was negative.   PHYSICAL EXAMINATION: General appearance: alert, cooperative and no distress Head: Normocephalic, without obvious abnormality, atraumatic Neck: no adenopathy, no JVD, supple, symmetrical, trachea midline and thyroid not  enlarged, symmetric, no tenderness/mass/nodules Lymph nodes: Cervical, supraclavicular, and axillary nodes normal. Resp: clear to auscultation bilaterally Back: symmetric, no curvature. ROM normal. No CVA tenderness. Cardio: regular rate and rhythm, S1, S2 normal, no murmur, click, rub or gallop GI: soft, non-tender; bowel sounds normal; no masses,  no organomegaly Extremities: extremities normal, atraumatic, no cyanosis or edema  ECOG PERFORMANCE STATUS: 0 - Asymptomatic  Blood pressure (!) 143/80, pulse 70, temperature 98.4 F (36.9 C), temperature source Oral, resp. rate 18, height '5\' 7"'$  (1.702 m), weight 162 lb 9.6 oz (73.8 kg), SpO2 99 %.  LABORATORY DATA: Lab Results  Component Value Date   WBC 3.4 (L) 06/13/2016   HGB 12.4 (L) 06/13/2016   HCT 37.0 (L) 06/13/2016   MCV 85.3 06/13/2016   PLT 197 06/13/2016      Chemistry      Component Value Date/Time   NA 141 04/18/2016 0940   K 4.5 04/18/2016 0940   CL 98 02/29/2016 1613   CL 106 08/06/2012 1244   CO2 26 04/18/2016 0940   BUN 14.3 04/18/2016 0940   CREATININE 0.9 04/18/2016 0940      Component Value Date/Time   CALCIUM 9.6 04/18/2016 0940   ALKPHOS 140 04/18/2016 0940   AST 24 04/18/2016 0940   ALT 22 04/18/2016 0940   BILITOT 0.34 04/18/2016 0940       RADIOGRAPHIC STUDIES: No results found.  ASSESSMENT AND PLAN:  This is a very pleasant 76 years old African-American male with bulky stage IV follicular lymphoma that was initially treated with CHOP/Rituxan followed by maintenance Rituxan. The patient had disease recurrence and he was treated again with Rituxan every 2 months status post 12 cycles and has been tolerating the treatment well. I discussed with the patient the option of discontinuing to treatment at this point versus proceeding with more maintenance treatment. He is concerned about disease recurrence and would like to continue with the treatment. I recommended for the patient to proceed with  cycle #13 today. I will see him back for follow-up visit in 2 months for reevaluation before the next dose of his treatment. He was advised to call immediately if he has any concerning symptoms in the interval. The patient voices understanding of current disease status and treatment options and is in agreement with the current care plan. All questions were answered. The patient knows to call the clinic with any problems, questions or concerns. We can certainly see the patient much sooner if necessary. I spent 10 minutes counseling the patient face to face. The total time spent in the appointment was 15 minutes.  Disclaimer: This note was dictated with voice recognition software. Similar sounding words can inadvertently be transcribed and may not be corrected upon review.

## 2016-06-13 NOTE — Patient Instructions (Signed)
Eric [name] Discharge Instructions for Patients Receiving Chemotherapy  Today you received the following chemotherapy agents:  Rituxan.  To help prevent nausea and vomiting after your treatment, we encourage you to take your nausea medication as ordered per MD.   If you develop nausea and vomiting that is not controlled by your nausea medication, call the clinic.   BELOW ARE SYMPTOMS THAT SHOULD BE REPORTED IMMEDIATELY:  *FEVER GREATER THAN 100.5 F  *CHILLS WITH OR WITHOUT FEVER  NAUSEA AND VOMITING THAT IS NOT CONTROLLED WITH YOUR NAUSEA MEDICATION  *UNUSUAL SHORTNESS OF BREATH  *UNUSUAL BRUISING OR BLEEDING  TENDERNESS IN MOUTH AND THROAT WITH OR WITHOUT PRESENCE OF ULCERS  *URINARY PROBLEMS  *BOWEL PROBLEMS  UNUSUAL RASH Items with * indicate a potential emergency and should be followed up as soon as possible.  Feel free to call the clinic you have any questions or concerns. The clinic phone number is [phone].  Please show the [place] at check-in to the Emergency Department and triage nurse.

## 2016-06-15 ENCOUNTER — Other Ambulatory Visit: Payer: Medicare Other

## 2016-06-15 ENCOUNTER — Ambulatory Visit (HOSPITAL_COMMUNITY)
Admission: RE | Admit: 2016-06-15 | Discharge: 2016-06-15 | Disposition: A | Discharge status: Home / Self Care | Payer: Medicare Other | Source: Ambulatory Visit | Attending: Internal Medicine | Admitting: Internal Medicine

## 2016-06-15 ENCOUNTER — Encounter (HOSPITAL_COMMUNITY): Payer: Self-pay

## 2016-06-15 DIAGNOSIS — K5730 Diverticulosis of large intestine without perforation or abscess without bleeding: Secondary | ICD-10-CM | POA: Diagnosis not present

## 2016-06-15 DIAGNOSIS — C8208 Follicular lymphoma grade I, lymph nodes of multiple sites: Secondary | ICD-10-CM | POA: Insufficient documentation

## 2016-06-15 DIAGNOSIS — N400 Enlarged prostate without lower urinary tract symptoms: Secondary | ICD-10-CM | POA: Insufficient documentation

## 2016-06-15 DIAGNOSIS — C8590 Non-Hodgkin lymphoma, unspecified, unspecified site: Secondary | ICD-10-CM | POA: Diagnosis not present

## 2016-06-15 MED ORDER — IOPAMIDOL (ISOVUE-300) INJECTION 61%
INTRAVENOUS | Status: AC
  Administered 2016-06-15: 100 mL
  Filled 2016-06-15: qty 100

## 2016-06-21 ENCOUNTER — Ambulatory Visit: Payer: Medicare Other | Admitting: Internal Medicine

## 2016-06-22 DIAGNOSIS — R972 Elevated prostate specific antigen [PSA]: Secondary | ICD-10-CM | POA: Diagnosis not present

## 2016-07-02 ENCOUNTER — Telehealth: Payer: Self-pay | Admitting: *Deleted

## 2016-07-02 NOTE — Telephone Encounter (Signed)
Opened in error

## 2016-08-07 ENCOUNTER — Encounter: Payer: Self-pay | Admitting: Pharmacy Technician

## 2016-08-07 NOTE — Progress Notes (Signed)
Patient qualified for [facility] and drug assistance is closed. Contact pharmacy if future assistance is needed.

## 2016-08-14 ENCOUNTER — Other Ambulatory Visit: Payer: Self-pay | Admitting: Medical Oncology

## 2016-08-14 DIAGNOSIS — C8208 Follicular lymphoma grade I, lymph nodes of multiple sites: Secondary | ICD-10-CM

## 2016-08-15 ENCOUNTER — Telehealth: Payer: Self-pay | Admitting: Internal Medicine

## 2016-08-15 ENCOUNTER — Ambulatory Visit (HOSPITAL_BASED_OUTPATIENT_CLINIC_OR_DEPARTMENT_OTHER): Payer: Medicare Other

## 2016-08-15 ENCOUNTER — Other Ambulatory Visit (HOSPITAL_BASED_OUTPATIENT_CLINIC_OR_DEPARTMENT_OTHER): Payer: Medicare Other

## 2016-08-15 ENCOUNTER — Ambulatory Visit (HOSPITAL_BASED_OUTPATIENT_CLINIC_OR_DEPARTMENT_OTHER): Payer: Medicare Other | Admitting: Internal Medicine

## 2016-08-15 ENCOUNTER — Encounter: Payer: Self-pay | Admitting: Internal Medicine

## 2016-08-15 VITALS — BP 152/75 | HR 65 | Temp 97.8°F | Resp 18 | Ht 67.0 in | Wt 158.8 lb

## 2016-08-15 DIAGNOSIS — Z5111 Encounter for antineoplastic chemotherapy: Secondary | ICD-10-CM

## 2016-08-15 DIAGNOSIS — Z5112 Encounter for antineoplastic immunotherapy: Secondary | ICD-10-CM

## 2016-08-15 DIAGNOSIS — C8208 Follicular lymphoma grade I, lymph nodes of multiple sites: Secondary | ICD-10-CM

## 2016-08-15 LAB — COMPREHENSIVE METABOLIC PANEL
ALT: 16 U/L (ref 0–55)
AST: 25 U/L (ref 5–34)
Albumin: 3.9 g/dL (ref 3.5–5.0)
Alkaline Phosphatase: 132 U/L (ref 40–150)
Anion Gap: 11 mEq/L (ref 3–11)
BILIRUBIN TOTAL: 0.48 mg/dL (ref 0.20–1.20)
BUN: 15.6 mg/dL (ref 7.0–26.0)
CHLORIDE: 106 meq/L (ref 98–109)
CO2: 25 meq/L (ref 22–29)
CREATININE: 1 mg/dL (ref 0.7–1.3)
Calcium: 9.6 mg/dL (ref 8.4–10.4)
EGFR: 70 mL/min/{1.73_m2} — AB (ref >90)
GLUCOSE: 82 mg/dL (ref 70–140)
Potassium: 4.5 mEq/L (ref 3.5–5.1)
SODIUM: 142 meq/L (ref 136–145)
TOTAL PROTEIN: 6.3 g/dL — AB (ref 6.4–8.3)

## 2016-08-15 LAB — CBC WITH DIFFERENTIAL/PLATELET
BASO%: 0.8 % (ref 0.0–2.0)
Basophils Absolute: 0 10*3/uL (ref 0.0–0.1)
EOS%: 5.4 % (ref 0.0–7.0)
Eosinophils Absolute: 0.2 10*3/uL (ref 0.0–0.5)
HCT: 43.1 % (ref 38.4–49.9)
HGB: 14.4 g/dL (ref 13.0–17.1)
LYMPH%: 27.9 % (ref 14.0–49.0)
MCH: 29 pg (ref 27.2–33.4)
MCHC: 33.4 g/dL (ref 32.0–36.0)
MCV: 86.7 fL (ref 79.3–98.0)
MONO#: 0.8 10*3/uL (ref 0.1–0.9)
MONO%: 20.2 % — AB (ref 0.0–14.0)
NEUT%: 45.7 % (ref 39.0–75.0)
NEUTROS ABS: 1.8 10*3/uL (ref 1.5–6.5)
Platelets: 188 10*3/uL (ref 140–400)
RBC: 4.97 10*6/uL (ref 4.20–5.82)
RDW: 14.6 % (ref 11.0–14.6)
WBC: 3.9 10*3/uL — AB (ref 4.0–10.3)
lymph#: 1.1 10*3/uL (ref 0.9–3.3)

## 2016-08-15 LAB — LACTATE DEHYDROGENASE: LDH: 278 U/L — ABNORMAL HIGH (ref 125–245)

## 2016-08-15 MED ORDER — RITUXIMAB CHEMO INJECTION 500 MG/50ML
375.0000 mg/m2 | Freq: Once | INTRAVENOUS | Status: AC
  Administered 2016-08-15: 700 mg via INTRAVENOUS
  Filled 2016-08-15: qty 50

## 2016-08-15 MED ORDER — SODIUM CHLORIDE 0.9 % IV SOLN
Freq: Once | INTRAVENOUS | Status: AC
  Administered 2016-08-15: 11:00:00 via INTRAVENOUS

## 2016-08-15 MED ORDER — ACETAMINOPHEN 325 MG PO TABS
650.0000 mg | ORAL_TABLET | Freq: Once | ORAL | Status: AC
  Administered 2016-08-15: 650 mg via ORAL

## 2016-08-15 MED ORDER — ACETAMINOPHEN 325 MG PO TABS
ORAL_TABLET | ORAL | Status: AC
  Filled 2016-08-15: qty 2

## 2016-08-15 MED ORDER — DIPHENHYDRAMINE HCL 25 MG PO CAPS
50.0000 mg | ORAL_CAPSULE | Freq: Once | ORAL | Status: AC
  Administered 2016-08-15: 50 mg via ORAL

## 2016-08-15 MED ORDER — DIPHENHYDRAMINE HCL 25 MG PO CAPS
ORAL_CAPSULE | ORAL | Status: AC
  Filled 2016-08-15: qty 2

## 2016-08-15 NOTE — Progress Notes (Signed)
[place] [phone]   [phone]  OFFICE PROGRESS NOTE  DIAGNOSIS: Bulky stage IV low-grade lymphoma diagnosed in November 2009.  PRIOR THERAPY: 1. Status post 7 cycles of systemic chemotherapy with CHOP/Rituxan. Last dose was given May 2010. 2. Status post 6 cycles of maintenance Rituxan 375 mg/sq m given every 2 months. Last dose was given on May 29, 2010, and the patient was lost to followup at that time. He received another dose on 12/11/2010, then again missed 2 doses. 3. systemic chemotherapy with Rituxan 375 mg/M2 on day 1 and that bendamustine 90 mg/M2 on days 1 and 2 every 4 weeks, status post 6 cycles. 4. Maintenance Rituxan 375 mg/M2 every 2 months, status post 12 cycles 5.  CURRENT THERAPY: Rituxan 375 MG/M2 given weekly for 4 weeks and this is followed by maintenance treatment every 2 months, status post 13 cycles.  INTERVAL HISTORY: Eric [name] 76 y.o. male returns to the clinic today for follow-up visit. The patient is feeling fine today with no specific complaints. He denied having any weight loss or night sweats. He has no chest pain, shortness of breath, cough or hemoptysis. He denied having any fever or chills. He has no nausea, vomiting, diarrhea or constipation. The patient has no bleeding issues. He is here today for evaluation before starting cycle #14.  MEDICAL HISTORY: Past Medical History:  Diagnosis Date  . Cancer ([place])   . Coronary atherosclerosis of native coronary artery   . Encounter for antineoplastic chemotherapy 06/20/2015  . GERD (gastroesophageal reflux disease)   . History of heart attack   . Hyperlipidemia   . Myocardial infarction ([place])   . Non Hodgkin's lymphoma ([place])     ALLERGIES:  is allergic to neosporin [neomycin-bacitracin zn-polymyx].  MEDICATIONS:  Current Outpatient Prescriptions  Medication Sig Dispense Refill  . aspirin EC 81 MG tablet Take 81 mg by mouth at bedtime.    Marland Kitchen  atorvastatin (LIPITOR) 40 MG tablet Take 1 tablet (40 mg total) by mouth daily. 90 tablet 3  . diphenhydrAMINE (BENADRYL) 25 MG tablet Take 25 mg by mouth at bedtime as needed for sleep.     . nitroGLYCERIN (NITROSTAT) 0.4 MG SL tablet Place 1 tablet (0.4 mg total) under the tongue every 5 (five) minutes x 3 doses as needed for chest pain. (Patient not taking: Reported on 06/13/2016) 25 tablet 3  . omeprazole (PRILOSEC) 20 MG capsule Take 20 mg by mouth daily as needed (acid relfux).    . sildenafil (VIAGRA) 25 MG tablet Take 1 tablet (25 mg total) by mouth daily as needed. Take 2-3 tablets by mouth once daily as needed. 90 tablet 6   No current facility-administered medications for this visit.     SURGICAL HISTORY:  Past Surgical History:  Procedure Laterality Date  . CORONARY STENT PLACEMENT    . LEFT HEART CATHETERIZATION WITH CORONARY ANGIOGRAM N/A 03/03/2012   Procedure: LEFT HEART CATHETERIZATION WITH CORONARY ANGIOGRAM;  Surgeon: [name], MD;  Location: [facility] C[facility] LAB;  Service: Cardiovascular;  Laterality: N/A;  . PERCUTANEOUS CORONARY STENT INTERVENTION (PCI-S) N/A 03/04/2012   Procedure: PERCUTANEOUS CORONARY STENT INTERVENTION (PCI-S);  Surgeon: [name], MD;  Location: [facility] CATH LAB;  Service: Cardiovascular;  Laterality: N/A;    REVIEW OF SYSTEMS:  A comprehensive review of systems was negative.   PHYSICAL EXAMINATION: General appearance: alert, cooperative and no distress Head: Normocephalic, without obvious abnormality, atraumatic Neck: no adenopathy, no JVD, supple, symmetrical, trachea  midline and thyroid not enlarged, symmetric, no tenderness/mass/nodules Lymph nodes: Cervical, supraclavicular, and axillary nodes normal. Resp: clear to auscultation bilaterally Back: symmetric, no curvature. ROM normal. No CVA tenderness. Cardio: regular rate and rhythm, S1, S2 normal, no murmur, click, rub or gallop GI: soft, non-tender; bowel sounds normal; no masses,  no  organomegaly Extremities: extremities normal, atraumatic, no cyanosis or edema  ECOG PERFORMANCE STATUS: 0 - Asymptomatic  Blood pressure (!) 152/75, pulse 65, temperature 97.8 F (36.6 C), temperature source Oral, resp. rate 18, height 5\' 7"  (1.702 m), weight 158 lb 12.8 oz (72 kg), SpO2 98 %.  LABORATORY DATA: Lab Results  Component Value Date   WBC 3.9 (L) 08/15/2016   HGB 14.4 08/15/2016   HCT 43.1 08/15/2016   MCV 86.7 08/15/2016   PLT 188 08/15/2016      Chemistry      Component Value Date/Time   NA 142 08/15/2016 0920   K 4.5 08/15/2016 0920   CL 98 02/29/2016 1613   CL 106 08/06/2012 1244   CO2 25 08/15/2016 0920   BUN 15.6 08/15/2016 0920   CREATININE 1.0 08/15/2016 0920      Component Value Date/Time   CALCIUM 9.6 08/15/2016 0920   ALKPHOS 132 08/15/2016 0920   AST 25 08/15/2016 0920   ALT 16 08/15/2016 0920   BILITOT 0.48 08/15/2016 0920       RADIOGRAPHIC STUDIES: No results found.  ASSESSMENT AND PLAN:  This is a very pleasant 76 years old African-American male with bulky stage IV follicular lymphoma initially treated with CHOP/Rituxan followed by maintenance Rituxan. The patient had disease recurrence and he was restarted again on treatment with Rituxan every 2 months is status post 13 cycles and has been tolerating this treatment fairly well. I recommended for him to proceed with cycle #14 today as scheduled. I will see him back for follow-up visit in 2 months with the next cycle of his treatment. He was advised to call immediately if he has any concerning symptoms in the interval. The patient voices understanding of current disease status and treatment options and is in agreement with the current care plan. All questions were answered. The patient knows to call the clinic with any problems, questions or concerns. We can certainly see the patient much sooner if necessary. I spent 10 minutes counseling the patient face to face. The total time spent in  the appointment was 15 minutes.  Disclaimer: This note was dictated with voice recognition software. Similar sounding words can inadvertently be transcribed and may not be corrected upon review.

## 2016-08-15 NOTE — Patient Instructions (Signed)
[facility] Discharge Instructions for Patients Receiving Chemotherapy  Today you received the following chemotherapy agents Rituxan To help prevent nausea and vomiting after your treatment, we encourage you to take your nausea medication as prescribed.  If you develop nausea and vomiting that is not controlled by your nausea medication, call the clinic.   BELOW ARE SYMPTOMS THAT SHOULD BE REPORTED IMMEDIATELY:  *FEVER GREATER THAN 100.5 F  *CHILLS WITH OR WITHOUT FEVER  NAUSEA AND VOMITING THAT IS NOT CONTROLLED WITH YOUR NAUSEA MEDICATION  *UNUSUAL SHORTNESS OF BREATH  *UNUSUAL BRUISING OR BLEEDING  TENDERNESS IN MOUTH AND THROAT WITH OR WITHOUT PRESENCE OF ULCERS  *URINARY PROBLEMS  *BOWEL PROBLEMS  UNUSUAL RASH Items with * indicate a potential emergency and should be followed up as soon as possible.  Feel free to call the clinic you have any questions or concerns. The clinic phone number is [phone].  Please show the CHEMO ALERT CARD at check-in to the Emergency Department and triage nurse.   

## 2016-08-15 NOTE — Telephone Encounter (Signed)
Lab, Chemo and follow up with Dr Julien Nordmann scheduled every 2 months,per 08/15/16 los. Patient was given a copy of the AVS report and appointment schedule,per 08/15/16 los.

## 2016-10-11 ENCOUNTER — Encounter: Payer: Self-pay | Admitting: Internal Medicine

## 2016-10-11 ENCOUNTER — Ambulatory Visit (HOSPITAL_BASED_OUTPATIENT_CLINIC_OR_DEPARTMENT_OTHER): Payer: Medicare Other | Admitting: Internal Medicine

## 2016-10-11 ENCOUNTER — Other Ambulatory Visit: Payer: Self-pay | Admitting: Internal Medicine

## 2016-10-11 ENCOUNTER — Telehealth: Payer: Self-pay | Admitting: Internal Medicine

## 2016-10-11 ENCOUNTER — Ambulatory Visit (HOSPITAL_BASED_OUTPATIENT_CLINIC_OR_DEPARTMENT_OTHER): Payer: Medicare Other

## 2016-10-11 ENCOUNTER — Other Ambulatory Visit (HOSPITAL_BASED_OUTPATIENT_CLINIC_OR_DEPARTMENT_OTHER): Payer: Medicare Other

## 2016-10-11 VITALS — BP 144/95 | HR 70 | Temp 98.0°F | Resp 18 | Ht 67.0 in | Wt 160.1 lb

## 2016-10-11 VITALS — BP 113/76 | HR 63 | Temp 98.0°F | Resp 17

## 2016-10-11 DIAGNOSIS — C8208 Follicular lymphoma grade I, lymph nodes of multiple sites: Secondary | ICD-10-CM

## 2016-10-11 DIAGNOSIS — Z5111 Encounter for antineoplastic chemotherapy: Secondary | ICD-10-CM

## 2016-10-11 DIAGNOSIS — Z5112 Encounter for antineoplastic immunotherapy: Secondary | ICD-10-CM | POA: Diagnosis not present

## 2016-10-11 LAB — CBC WITH DIFFERENTIAL/PLATELET
BASO%: 0.5 % (ref 0.0–2.0)
BASOS ABS: 0 10*3/uL (ref 0.0–0.1)
EOS%: 6.7 % (ref 0.0–7.0)
Eosinophils Absolute: 0.3 10*3/uL (ref 0.0–0.5)
HEMATOCRIT: 42.2 % (ref 38.4–49.9)
HEMOGLOBIN: 14.1 g/dL (ref 13.0–17.1)
LYMPH#: 1.2 10*3/uL (ref 0.9–3.3)
LYMPH%: 28.9 % (ref 14.0–49.0)
MCH: 29.6 pg (ref 27.2–33.4)
MCHC: 33.4 g/dL (ref 32.0–36.0)
MCV: 88.6 fL (ref 79.3–98.0)
MONO#: 0.7 10*3/uL (ref 0.1–0.9)
MONO%: 17.5 % — ABNORMAL HIGH (ref 0.0–14.0)
NEUT#: 1.9 10*3/uL (ref 1.5–6.5)
NEUT%: 46.4 % (ref 39.0–75.0)
PLATELETS: 205 10*3/uL (ref 140–400)
RBC: 4.77 10*6/uL (ref 4.20–5.82)
RDW: 15 % — AB (ref 11.0–14.6)
WBC: 4.2 10*3/uL (ref 4.0–10.3)

## 2016-10-11 LAB — COMPREHENSIVE METABOLIC PANEL
ALBUMIN: 3.8 g/dL (ref 3.5–5.0)
ALK PHOS: 145 U/L (ref 40–150)
ALT: 22 U/L (ref 0–55)
ANION GAP: 8 meq/L (ref 3–11)
AST: 26 U/L (ref 5–34)
BUN: 11.8 mg/dL (ref 7.0–26.0)
CALCIUM: 9.2 mg/dL (ref 8.4–10.4)
CHLORIDE: 107 meq/L (ref 98–109)
CO2: 26 mEq/L (ref 22–29)
CREATININE: 1 mg/dL (ref 0.7–1.3)
EGFR: 77 mL/min/{1.73_m2} — ABNORMAL LOW (ref >90)
Glucose: 85 mg/dl (ref 70–140)
Potassium: 4.2 mEq/L (ref 3.5–5.1)
Sodium: 141 mEq/L (ref 136–145)
Total Bilirubin: 0.49 mg/dL (ref 0.20–1.20)
Total Protein: 6.1 g/dL — ABNORMAL LOW (ref 6.4–8.3)

## 2016-10-11 LAB — LACTATE DEHYDROGENASE: LDH: 249 U/L — AB (ref 125–245)

## 2016-10-11 LAB — TECHNOLOGIST REVIEW

## 2016-10-11 MED ORDER — DIPHENHYDRAMINE HCL 25 MG PO CAPS
50.0000 mg | ORAL_CAPSULE | Freq: Once | ORAL | Status: AC
  Administered 2016-10-11: 50 mg via ORAL

## 2016-10-11 MED ORDER — ACETAMINOPHEN 325 MG PO TABS
ORAL_TABLET | ORAL | Status: AC
  Filled 2016-10-11: qty 2

## 2016-10-11 MED ORDER — DIPHENHYDRAMINE HCL 25 MG PO CAPS
ORAL_CAPSULE | ORAL | Status: AC
  Filled 2016-10-11: qty 2

## 2016-10-11 MED ORDER — ACETAMINOPHEN 325 MG PO TABS
650.0000 mg | ORAL_TABLET | Freq: Once | ORAL | Status: AC
  Administered 2016-10-11: 650 mg via ORAL

## 2016-10-11 MED ORDER — SODIUM CHLORIDE 0.9 % IV SOLN
Freq: Once | INTRAVENOUS | Status: AC
  Administered 2016-10-11: 09:00:00 via INTRAVENOUS

## 2016-10-11 MED ORDER — SODIUM CHLORIDE 0.9 % IV SOLN
375.0000 mg/m2 | Freq: Once | INTRAVENOUS | Status: AC
  Administered 2016-10-11: 700 mg via INTRAVENOUS
  Filled 2016-10-11: qty 50

## 2016-10-11 NOTE — Telephone Encounter (Signed)
Scheduled appt per 8/9 los - appts already scheduled - just r/s per patient request.

## 2016-10-11 NOTE — Patient Instructions (Signed)
[facility] Discharge Instructions for Patients Receiving Chemotherapy  Today you received the following chemotherapy agents Rituxan To help prevent nausea and vomiting after your treatment, we encourage you to take your nausea medication as prescribed.  If you develop nausea and vomiting that is not controlled by your nausea medication, call the clinic.   BELOW ARE SYMPTOMS THAT SHOULD BE REPORTED IMMEDIATELY:  *FEVER GREATER THAN 100.5 F  *CHILLS WITH OR WITHOUT FEVER  NAUSEA AND VOMITING THAT IS NOT CONTROLLED WITH YOUR NAUSEA MEDICATION  *UNUSUAL SHORTNESS OF BREATH  *UNUSUAL BRUISING OR BLEEDING  TENDERNESS IN MOUTH AND THROAT WITH OR WITHOUT PRESENCE OF ULCERS  *URINARY PROBLEMS  *BOWEL PROBLEMS  UNUSUAL RASH Items with * indicate a potential emergency and should be followed up as soon as possible.  Feel free to call the clinic you have any questions or concerns. The clinic phone number is [phone].  Please show the CHEMO ALERT CARD at check-in to the Emergency Department and triage nurse.   

## 2016-10-11 NOTE — Progress Notes (Signed)
[place] [phone]   [phone]  OFFICE PROGRESS NOTE  DIAGNOSIS: Bulky stage IV low-grade lymphoma diagnosed in November 2009.  PRIOR THERAPY: 1. Status post 7 cycles of systemic chemotherapy with CHOP/Rituxan. Last dose was given May 2010. 2. Status post 6 cycles of maintenance Rituxan 375 mg/sq m given every 2 months. Last dose was given on May 29, 2010, and the patient was lost to followup at that time. He received another dose on 12/11/2010, then again missed 2 doses. 3. systemic chemotherapy with Rituxan 375 mg/M2 on day 1 and that bendamustine 90 mg/M2 on days 1 and 2 every 4 weeks, status post 6 cycles. 4. Maintenance Rituxan 375 mg/M2 every 2 months, status post 12 cycles 5.  CURRENT THERAPY: Rituxan 375 MG/M2 given weekly for 4 weeks and this is followed by maintenance treatment every 2 months, status post 14 cycles.  INTERVAL HISTORY: Eric [name] 76 y.o. male returns to the clinic today for two-month follow-up visit. The patient is feeling fine today with no specific complaints. He denied having any chest pain, shortness of breath, cough or hemoptysis. He denied having any weight loss or night sweats. He has no nausea or vomiting. He is tolerating his current treatment with Rituxan fairly well. He is here for evaluation before starting cycle #15.  MEDICAL HISTORY: Past Medical History:  Diagnosis Date  . Cancer ([place])   . Coronary atherosclerosis of native coronary artery   . Encounter for antineoplastic chemotherapy 06/20/2015  . GERD (gastroesophageal reflux disease)   . History of heart attack   . Hyperlipidemia   . Myocardial infarction ([place])   . Non Hodgkin's lymphoma ([place])     ALLERGIES:  is allergic to neosporin [neomycin-bacitracin zn-polymyx].  MEDICATIONS:  Current Outpatient Prescriptions  Medication Sig Dispense Refill  . aspirin EC 81 MG tablet Take 81 mg by mouth at bedtime.    Marland Kitchen atorvastatin (LIPITOR) 40  MG tablet Take 1 tablet (40 mg total) by mouth daily. 90 tablet 3  . diphenhydrAMINE (BENADRYL) 25 MG tablet Take 25 mg by mouth at bedtime as needed for sleep.     . nitroGLYCERIN (NITROSTAT) 0.4 MG SL tablet Place 1 tablet (0.4 mg total) under the tongue every 5 (five) minutes x 3 doses as needed for chest pain. (Patient not taking: Reported on 06/13/2016) 25 tablet 3  . omeprazole (PRILOSEC) 20 MG capsule Take 20 mg by mouth daily as needed (acid relfux).    . sildenafil (VIAGRA) 25 MG tablet Take 1 tablet (25 mg total) by mouth daily as needed. Take 2-3 tablets by mouth once daily as needed. 90 tablet 6   No current facility-administered medications for this visit.     SURGICAL HISTORY:  Past Surgical History:  Procedure Laterality Date  . CORONARY STENT PLACEMENT    . LEFT HEART CATHETERIZATION WITH CORONARY ANGIOGRAM N/A 03/03/2012   Procedure: LEFT HEART CATHETERIZATION WITH CORONARY ANGIOGRAM;  Surgeon: [name], MD;  Location[facility] CATH LAB;  Service: Cardiovascular;  Laterality: N/A;  . PERCUTANEOUS CORONARY STENT INTERVENTION (PCI-S) N/A 03/04/2012   Procedure: PERCUTANEOUS CORONARY STENT INTERVENTION (PCI-S);  Surgeon: [name], MD;  Location: [facility] CATH LAB;  Service: Cardiovascular;  Laterality: N/A;    REVIEW OF SYSTEMS:  A comprehensive review of systems was negative.   PHYSICAL EXAMINATION: General appearance: alert, cooperative and no distress Head: Normocephalic, without obvious abnormality, atraumatic Neck: no adenopathy, no JVD, supple, symmetrical, trachea midline and thyroid not  enlarged, symmetric, no tenderness/mass/nodules Lymph nodes: Cervical, supraclavicular, and axillary nodes normal. Resp: clear to auscultation bilaterally Back: symmetric, no curvature. ROM normal. No CVA tenderness. Cardio: regular rate and rhythm, S1, S2 normal, no murmur, click, rub or gallop GI: soft, non-tender; bowel sounds normal; no masses,  no organomegaly Extremities:  extremities normal, atraumatic, no cyanosis or edema  ECOG PERFORMANCE STATUS: 0 - Asymptomatic  Blood pressure (!) 144/95, pulse 70, temperature 98 F (36.7 C), temperature source Oral, resp. rate 18, height 5\' 7"  (1.702 m), weight 160 lb 1.6 oz (72.6 kg), SpO2 100 %.  LABORATORY DATA: Lab Results  Component Value Date   WBC 3.9 (L) 08/15/2016   HGB 14.4 08/15/2016   HCT 43.1 08/15/2016   MCV 86.7 08/15/2016   PLT 188 08/15/2016      Chemistry      Component Value Date/Time   NA 142 08/15/2016 0920   K 4.5 08/15/2016 0920   CL 98 02/29/2016 1613   CL 106 08/06/2012 1244   CO2 25 08/15/2016 0920   BUN 15.6 08/15/2016 0920   CREATININE 1.0 08/15/2016 0920      Component Value Date/Time   CALCIUM 9.6 08/15/2016 0920   ALKPHOS 132 08/15/2016 0920   AST 25 08/15/2016 0920   ALT 16 08/15/2016 0920   BILITOT 0.48 08/15/2016 0920       RADIOGRAPHIC STUDIES: No results found.  ASSESSMENT AND PLAN:  This is a very pleasant 76 years old African-American male with bulky stage IV follicular lymphoma initially treated with CHOP/Rituxan followed by maintenance Rituxan. The patient had disease recurrence and he was restarted again on treatment with Rituxan every 2 months status post 14 cycles and has been tolerating this treatment fairly well. I recommended for the patient to proceed with cycle #15 today. I will see him back for follow-up visit in 2 months for evaluation before starting cycle #16. The patient was advised to call immediately if he has any concerning symptoms in the interval. The patient voices understanding of current disease status and treatment options and is in agreement with the current care plan. All questions were answered. The patient knows to call the clinic with any problems, questions or concerns. We can certainly see the patient much sooner if necessary. I spent 10 minutes counseling the patient face to face. The total time spent in the appointment was 15  minutes.  Disclaimer: This note was dictated with voice recognition software. Similar sounding words can inadvertently be transcribed and may not be corrected upon review.

## 2016-11-06 ENCOUNTER — Other Ambulatory Visit: Payer: Self-pay | Admitting: *Deleted

## 2016-11-06 NOTE — Telephone Encounter (Signed)
Error

## 2016-11-14 ENCOUNTER — Encounter (HOSPITAL_COMMUNITY): Payer: Self-pay | Admitting: Emergency Medicine

## 2016-11-14 DIAGNOSIS — Z7982 Long term (current) use of aspirin: Secondary | ICD-10-CM | POA: Insufficient documentation

## 2016-11-14 DIAGNOSIS — I252 Old myocardial infarction: Secondary | ICD-10-CM | POA: Diagnosis not present

## 2016-11-14 DIAGNOSIS — L02213 Cutaneous abscess of chest wall: Secondary | ICD-10-CM | POA: Diagnosis not present

## 2016-11-14 DIAGNOSIS — Z79899 Other long term (current) drug therapy: Secondary | ICD-10-CM | POA: Insufficient documentation

## 2016-11-14 DIAGNOSIS — N611 Abscess of the breast and nipple: Secondary | ICD-10-CM | POA: Diagnosis not present

## 2016-11-14 DIAGNOSIS — I2510 Atherosclerotic heart disease of native coronary artery without angina pectoris: Secondary | ICD-10-CM | POA: Insufficient documentation

## 2016-11-14 NOTE — ED Triage Notes (Signed)
Pt reports abscess to L chest present X1 week, above L nipple. Swelling, redness noted.

## 2016-11-15 ENCOUNTER — Emergency Department (HOSPITAL_COMMUNITY)
Admission: EM | Admit: 2016-11-15 | Discharge: 2016-11-15 | Disposition: A | Discharge status: Home / Self Care | Payer: Medicare Other | Attending: Emergency Medicine | Admitting: Emergency Medicine

## 2016-11-15 DIAGNOSIS — N611 Abscess of the breast and nipple: Secondary | ICD-10-CM

## 2016-11-15 MED ORDER — DOXYCYCLINE HYCLATE 100 MG PO CAPS: 100.0000 mg | ORAL_CAPSULE | Freq: Two times a day (BID) | ORAL | 0 refills | Status: DC

## 2016-11-15 MED ORDER — DOXYCYCLINE HYCLATE 100 MG PO TABS
100.0000 mg | ORAL_TABLET | Freq: Once | ORAL | Status: AC
  Administered 2016-11-15: 100 mg via ORAL
  Filled 2016-11-15: qty 1

## 2016-11-15 MED ORDER — HYDROCODONE-ACETAMINOPHEN 5-325 MG PO TABS
1.0000 | ORAL_TABLET | Freq: Once | ORAL | Status: AC
  Administered 2016-11-15: 1 via ORAL
  Filled 2016-11-15: qty 1

## 2016-11-15 MED ORDER — LIDOCAINE-EPINEPHRINE (PF) 2 %-1:200000 IJ SOLN
10.0000 mL | Freq: Once | INTRAMUSCULAR | Status: AC
  Administered 2016-11-15: 10 mL via INTRADERMAL
  Filled 2016-11-15: qty 20

## 2016-11-15 MED ORDER — LIDOCAINE-EPINEPHRINE 2 %-1:100000 IJ SOLN: 20.0000 mL | Freq: Once | INTRAMUSCULAR | Status: DC

## 2016-11-15 MED ORDER — HYDROCODONE-ACETAMINOPHEN 5-325 MG PO TABS: 1.0000 | ORAL_TABLET | Freq: Four times a day (QID) | ORAL | 0 refills | Status: DC | PRN

## 2016-11-15 NOTE — ED Notes (Signed)
EDP at bedside  

## 2016-11-15 NOTE — ED Provider Notes (Signed)
[place] DEPT Provider Note   CSN: [id number] Arrival date & time: 11/14/16  2153     History   Chief Complaint Chief Complaint  Patient presents with  . Abscess    HPI Eric [name] is a 76 y.o. male.  The history is provided by the patient and the spouse.  Abscess  Location:  [place] abscess location:  L breast Abscess quality: induration, painful and redness   Duration:  1 week Progression:  Worsening Pain details:    Quality:  Dull   Severity:  Moderate   Timing:  Constant   Progression:  Worsening Chronicity:  New Context: not diabetes   Relieved by:  Nothing Worsened by:  Nothing Associated symptoms: no fever and no vomiting   pt has noticed redness/swelling/pain to his left breast No fever/vomiting He has never had this before  He receives chemo for lymphoma, last treatment was over a month ago He has otherwise felt well recently   Past Medical History:  Diagnosis Date  . Cancer ([place])   . Coronary atherosclerosis of native coronary artery   . Encounter for antineoplastic chemotherapy 06/20/2015  . GERD (gastroesophageal reflux disease)   . History of heart attack   . Hyperlipidemia   . Myocardial infarction ([place])   . Non Hodgkin's lymphoma [facility])     Patient Active Problem List   Diagnosis Date Noted  . Hypercholesterolemia 03/25/2016  . Encounter for antineoplastic chemotherapy 06/20/2015  . Grade 1 follicular lymphoma of lymph nodes of multiple regions ([place]) 02/16/2015  . CAD in native artery 08/18/2014  . Hearing loss 07/11/2012  . Insomnia, idiopathic 07/11/2012  . Erectile dysfunction 07/11/2012  . Non-STEMI (non-ST elevated myocardial infarction) ([place]) 03/06/2012  . Antithrombotic drugs (platelet-aggregation inhibitors) causing adverse effect in therapeutic use 03/05/2012    Class: Acute  . Lymphoma ([place]) 06/05/2011    Class: Chronic    Past Surgical History:  Procedure Laterality Date  . CORONARY STENT PLACEMENT    .  LEFT HEART CATHETERIZATION WITH CORONARY ANGIOGRAM N/A 03/03/2012   Procedure: LEFT HEART CATHETERIZATION WITH CORONARY ANGIOGRAM;  Surgeon: [name], MD;  Location: [facility] CATH LAB;  Service: Cardiovascular;  Laterality: N/A;  . PERCUTANEOUS CORONARY STENT INTERVENTION (PCI-S) N/A 03/04/2012   Procedure: PERCUTANEOUS CORONARY STENT INTERVENTION (PCI-S);  Surgeon: [name], MD;  Location: [facility] CATH LAB;  Service: Cardiovascular;  Laterality: N/A;       Home Medications    Prior to Admission medications   Medication Sig Start Date End Date Taking? Authorizing Provider  aspirin EC 81 MG tablet Take 81 mg by mouth at bedtime.    [provider]  atorvastatin (LIPITOR) 40 MG tablet Take 1 tablet (40 mg total) by mouth daily. 04/03/16   Wardell Honour, MD  diphenhydrAMINE (BENADRYL) 25 MG tablet Take 25 mg by mouth at bedtime as needed for sleep.     [provider]  doxycycline (VIBRAMYCIN) 100 MG capsule Take 1 capsule (100 mg total) by mouth 2 (two) times daily. One po bid x 7 days 11/15/16   Ripley Fraise, MD  HYDROcodone-acetaminophen (NORCO/VICODIN) 5-325 MG tablet Take 1 tablet by mouth every 6 (six) hours as needed. 11/15/16   Ripley Fraise, MD  nitroGLYCERIN (NITROSTAT) 0.4 MG SL tablet Place 1 tablet (0.4 mg total) under the tongue every 5 (five) minutes x 3 doses as needed for chest pain. Patient not taking: Reported on 06/13/2016 01/05/15   Jaynee Eagles, PA-C  omeprazole (PRILOSEC) 20 MG capsule Take  20 mg by mouth daily as needed (acid relfux).    [provider]  sildenafil (VIAGRA) 25 MG tablet Take 1 tablet (25 mg total) by mouth daily as needed. Take 2-3 tablets by mouth once daily as needed. 01/05/15   Jaynee Eagles, PA-C    Family History Family History  Problem Relation Age of Onset  . Leukemia Mother     Social History Social History  Substance Use Topics  . Smoking status: Never Smoker  . Smokeless tobacco: Never Used  . Alcohol use  Yes     Comment: occasionally     Allergies   Neosporin [neomycin-bacitracin zn-polymyx]   Review of Systems Review of Systems  Constitutional: Negative for fever.  Gastrointestinal: Negative for vomiting.  Skin: Positive for wound.  All other systems reviewed and are negative.    Physical Exam Updated Vital Signs BP 128/79 (BP Location: Right Arm)   Pulse 73   Temp 98.1 F (36.7 C) (Oral)   Resp 15   Ht 1.702 m (5\' 7" )   Wt 70.3 kg (155 lb)   SpO2 99%   BMI 24.28 kg/m   Physical Exam CONSTITUTIONAL: Well developed/well nourished HEAD: Normocephalic/atraumatic ENMT: Mucous membranes moist NECK: supple no meningeal signs CV: S1/S2 noted, no murmurs/rubs/gallops noted LUNGS: Lungs are clear to auscultation bilaterally, no apparent distress ABDOMEN: soft, nontender, no rebound or guarding, bowel sounds noted throughout abdomen GU:no cva tenderness NEURO: Pt is awake/alert/appropriate, moves all extremitiesx4.  No facial droop.   SKIN: warm, color normal, left breast - erythema/induration/fluctuance to nipple around 11 oclock position No drainage. No nipple discharge PSYCH: no abnormalities of mood noted, alert and oriented to situation   ED Treatments / Results  Labs (all labs ordered are listed, but only abnormal results are displayed) Labs Reviewed - No data to display  EKG  EKG Interpretation None       Radiology No results found.  Procedures Procedures   INCISION AND DRAINAGE Performed by: Sharyon Cable Consent: Verbal consent obtained. Risks and benefits: risks, benefits and alternatives were discussed Type: abscess  Body area: left breast  Anesthesia: local infiltration  Incision was made with a scalpel.  Local anesthetic: lidocaine % with epinephrine  Anesthetic total: 3 ml  Complexity: complex Blunt dissection to break up loculations  Drainage: purulent  Drainage amount: moderate Patient tolerance: Patient tolerated the  procedure well with no immediate complications.     EMERGENCY DEPARTMENT US SOFT TISSUE INTERPRETATION "Study: Limited Soft Tissue Ultrasound"  INDICATIONS: Soft tissue infection Multiple views of the body part were obtained in real-time with a multi-frequency linear probe  PERFORMED BY: Myself IMAGES ARCHIVED?: Yes SIDE:Left BODY PART:Breast INTERPRETATION:  Abcess present and Cellulitis present     Medications Ordered in ED Medications  doxycycline (VIBRA-TABS) tablet 100 mg (100 mg Oral Given 11/15/16 0259)  HYDROcodone-acetaminophen (NORCO/VICODIN) 5-325 MG per tablet 1 tablet (1 tablet Oral Given 11/15/16 0259)  lidocaine-EPINEPHrine (XYLOCAINE W/EPI) 2 %-1:200000 (PF) injection 10 mL (10 mLs Intradermal Given 11/15/16 0259)     Initial Impression / Assessment and Plan / ED Course  I have reviewed the triage vital signs and the nursing notes.   Pt with isolated cellulitis/abscess to left breast Most recent labs in EPIC reveal he does not have any neutropenia from chemo He denies fever/chills and last treatment >1 month ago  He tolerated I&D well Small incision made Pt felt improved after procedure Will start doxycycline Will d/c home  Final Clinical Impressions(s) / ED Diagnoses  Final diagnoses:  Abscess of left breast    New Prescriptions Discharge Medication List as of 11/15/2016  3:56 AM    START taking these medications   Details  doxycycline (VIBRAMYCIN) 100 MG capsule Take 1 capsule (100 mg total) by mouth 2 (two) times daily. One po bid x 7 days, Starting Thu 11/15/2016, Print         Ripley Fraise, MD 11/15/16 [phone]

## 2016-11-15 NOTE — ED Notes (Signed)
E-signature not available, pt verbalized understanding of DC instructions and prescriptions 

## 2016-12-11 ENCOUNTER — Ambulatory Visit: Payer: Medicare Other

## 2016-12-11 ENCOUNTER — Ambulatory Visit: Payer: Medicare Other | Admitting: Internal Medicine

## 2016-12-11 ENCOUNTER — Other Ambulatory Visit: Payer: Medicare Other

## 2016-12-12 ENCOUNTER — Other Ambulatory Visit: Payer: Self-pay | Admitting: Medical Oncology

## 2016-12-12 DIAGNOSIS — C8208 Follicular lymphoma grade I, lymph nodes of multiple sites: Secondary | ICD-10-CM

## 2016-12-13 ENCOUNTER — Ambulatory Visit (HOSPITAL_BASED_OUTPATIENT_CLINIC_OR_DEPARTMENT_OTHER): Payer: Medicare Other

## 2016-12-13 ENCOUNTER — Ambulatory Visit (HOSPITAL_BASED_OUTPATIENT_CLINIC_OR_DEPARTMENT_OTHER): Payer: Medicare Other | Admitting: Internal Medicine

## 2016-12-13 ENCOUNTER — Encounter: Payer: Self-pay | Admitting: Internal Medicine

## 2016-12-13 ENCOUNTER — Other Ambulatory Visit (HOSPITAL_BASED_OUTPATIENT_CLINIC_OR_DEPARTMENT_OTHER): Payer: Medicare Other

## 2016-12-13 ENCOUNTER — Telehealth: Payer: Self-pay | Admitting: Internal Medicine

## 2016-12-13 VITALS — BP 138/83 | HR 69 | Temp 97.8°F | Resp 18 | Ht 67.0 in | Wt 157.1 lb

## 2016-12-13 DIAGNOSIS — C8208 Follicular lymphoma grade I, lymph nodes of multiple sites: Secondary | ICD-10-CM

## 2016-12-13 DIAGNOSIS — Z5112 Encounter for antineoplastic immunotherapy: Secondary | ICD-10-CM

## 2016-12-13 DIAGNOSIS — Z5111 Encounter for antineoplastic chemotherapy: Secondary | ICD-10-CM

## 2016-12-13 LAB — TECHNOLOGIST REVIEW

## 2016-12-13 LAB — CBC WITH DIFFERENTIAL/PLATELET
BASO%: 1.3 % (ref 0.0–2.0)
BASOS ABS: 0 10*3/uL (ref 0.0–0.1)
EOS%: 3.6 % (ref 0.0–7.0)
Eosinophils Absolute: 0.1 10*3/uL (ref 0.0–0.5)
HEMATOCRIT: 41.2 % (ref 38.4–49.9)
HGB: 13.8 g/dL (ref 13.0–17.1)
LYMPH#: 1.1 10*3/uL (ref 0.9–3.3)
LYMPH%: 31.5 % (ref 14.0–49.0)
MCH: 29.7 pg (ref 27.2–33.4)
MCHC: 33.3 g/dL (ref 32.0–36.0)
MCV: 88.9 fL (ref 79.3–98.0)
MONO#: 0.7 10*3/uL (ref 0.1–0.9)
MONO%: 19.1 % — ABNORMAL HIGH (ref 0.0–14.0)
NEUT#: 1.6 10*3/uL (ref 1.5–6.5)
NEUT%: 44.5 % (ref 39.0–75.0)
Platelets: 218 10*3/uL (ref 140–400)
RBC: 4.64 10*6/uL (ref 4.20–5.82)
RDW: 14.4 % (ref 11.0–14.6)
WBC: 3.6 10*3/uL — ABNORMAL LOW (ref 4.0–10.3)

## 2016-12-13 LAB — COMPREHENSIVE METABOLIC PANEL
ALT: 18 U/L (ref 0–55)
ANION GAP: 9 meq/L (ref 3–11)
AST: 22 U/L (ref 5–34)
Albumin: 3.8 g/dL (ref 3.5–5.0)
Alkaline Phosphatase: 132 U/L (ref 40–150)
BILIRUBIN TOTAL: 0.48 mg/dL (ref 0.20–1.20)
BUN: 14.9 mg/dL (ref 7.0–26.0)
CALCIUM: 9.1 mg/dL (ref 8.4–10.4)
CHLORIDE: 109 meq/L (ref 98–109)
CO2: 23 mEq/L (ref 22–29)
CREATININE: 1 mg/dL (ref 0.7–1.3)
Glucose: 87 mg/dl (ref 70–140)
Potassium: 3.9 mEq/L (ref 3.5–5.1)
Sodium: 141 mEq/L (ref 136–145)
Total Protein: 6.1 g/dL — ABNORMAL LOW (ref 6.4–8.3)

## 2016-12-13 LAB — LACTATE DEHYDROGENASE: LDH: 225 U/L (ref 125–245)

## 2016-12-13 MED ORDER — ACETAMINOPHEN 325 MG PO TABS
ORAL_TABLET | ORAL | Status: AC
  Filled 2016-12-13: qty 2

## 2016-12-13 MED ORDER — SODIUM CHLORIDE 0.9 % IV SOLN
375.0000 mg/m2 | Freq: Once | INTRAVENOUS | Status: AC
  Administered 2016-12-13: 700 mg via INTRAVENOUS
  Filled 2016-12-13: qty 50

## 2016-12-13 MED ORDER — DIPHENHYDRAMINE HCL 25 MG PO CAPS
ORAL_CAPSULE | ORAL | Status: AC
  Filled 2016-12-13: qty 2

## 2016-12-13 MED ORDER — SODIUM CHLORIDE 0.9 % IV SOLN
Freq: Once | INTRAVENOUS | Status: AC
  Administered 2016-12-13: 10:00:00 via INTRAVENOUS

## 2016-12-13 MED ORDER — ACETAMINOPHEN 325 MG PO TABS
650.0000 mg | ORAL_TABLET | Freq: Once | ORAL | Status: AC
  Administered 2016-12-13: 650 mg via ORAL

## 2016-12-13 MED ORDER — DIPHENHYDRAMINE HCL 25 MG PO CAPS
50.0000 mg | ORAL_CAPSULE | Freq: Once | ORAL | Status: AC
Start: 2016-12-13 — End: 2016-12-13
  Administered 2016-12-13: 50 mg via ORAL

## 2016-12-13 NOTE — Addendum Note (Signed)
Addended by: Ardeen Garland on: 12/13/2016 09:14 AM   Modules accepted: Orders

## 2016-12-13 NOTE — Patient Instructions (Signed)
[facility] Discharge Instructions for Patients Receiving Chemotherapy  Today you received the following chemotherapy agents:  Rituxan.  To help prevent nausea and vomiting after your treatment, we encourage you to take your nausea medication as directed.   If you develop nausea and vomiting that is not controlled by your nausea medication, call the clinic.   BELOW ARE SYMPTOMS THAT SHOULD BE REPORTED IMMEDIATELY:  *FEVER GREATER THAN 100.5 F  *CHILLS WITH OR WITHOUT FEVER  NAUSEA AND VOMITING THAT IS NOT CONTROLLED WITH YOUR NAUSEA MEDICATION  *UNUSUAL SHORTNESS OF BREATH  *UNUSUAL BRUISING OR BLEEDING  TENDERNESS IN MOUTH AND THROAT WITH OR WITHOUT PRESENCE OF ULCERS  *URINARY PROBLEMS  *BOWEL PROBLEMS  UNUSUAL RASH Items with * indicate a potential emergency and should be followed up as soon as possible.  Feel free to call the clinic should you have any questions or concerns. The clinic phone number is [phone].  Please show the CHEMO ALERT CARD at check-in to the Emergency Department and triage nurse.   

## 2016-12-13 NOTE — Progress Notes (Signed)
[place] [phone]   [phone]  OFFICE PROGRESS NOTE  DIAGNOSIS: Bulky stage IV low-grade lymphoma diagnosed in November 2009.  PRIOR THERAPY: 1. Status post 7 cycles of systemic chemotherapy with CHOP/Rituxan. Last dose was given May 2010. 2. Status post 6 cycles of maintenance Rituxan 375 mg/sq m given every 2 months. Last dose was given on May 29, 2010, and the patient was lost to followup at that time. He received another dose on 12/11/2010, then again missed 2 doses. 3. systemic chemotherapy with Rituxan 375 mg/M2 on day 1 and that bendamustine 90 mg/M2 on days 1 and 2 every 4 weeks, status post 6 cycles. 4. Maintenance Rituxan 375 mg/M2 every 2 months, status post 12 cycles 5.  CURRENT THERAPY: Rituxan 375 MG/M2 given weekly for 4 weeks and this is followed by maintenance treatment every 2 months, status post 15 cycles.  INTERVAL HISTORY: Eric [name] 76 y.o. male returns to the clinic today for follow-up visit. The patient is feeling fine today with no specific complaints. He denied having any chest pain, shortness of breath, cough or hemoptysis. He denied having any fever or chills. She has no nausea, vomiting, diarrhea or constipation. The patient is here today for evaluation before starting cycle #16 of his maintenance treatment with Rituxan.  MEDICAL HISTORY: Past Medical History:  Diagnosis Date  . Cancer ([place])   . Coronary atherosclerosis of native coronary artery   . Encounter for antineoplastic chemotherapy 06/20/2015  . GERD (gastroesophageal reflux disease)   . History of heart attack   . Hyperlipidemia   . Myocardial infarction ([place])   . Non Hodgkin's lymphoma ([place])     ALLERGIES:  is allergic to neosporin [neomycin-bacitracin zn-polymyx].  MEDICATIONS:  Current Outpatient Prescriptions  Medication Sig Dispense Refill  . aspirin EC 81 MG tablet Take 81 mg by mouth at bedtime.    Marland Kitchen atorvastatin (LIPITOR) 40 MG  tablet Take 1 tablet (40 mg total) by mouth daily. 90 tablet 3  . diphenhydrAMINE (BENADRYL) 25 MG tablet Take 25 mg by mouth at bedtime as needed for sleep.     Marland Kitchen doxycycline (VIBRAMYCIN) 100 MG capsule Take 1 capsule (100 mg total) by mouth 2 (two) times daily. One po bid x 7 days 14 capsule 0  . HYDROcodone-acetaminophen (NORCO/VICODIN) 5-325 MG tablet Take 1 tablet by mouth every 6 (six) hours as needed. 5 tablet 0  . nitroGLYCERIN (NITROSTAT) 0.4 MG SL tablet Place 1 tablet (0.4 mg total) under the tongue every 5 (five) minutes x 3 doses as needed for chest pain. (Patient not taking: Reported on 06/13/2016) 25 tablet 3  . omeprazole (PRILOSEC) 20 MG capsule Take 20 mg by mouth daily as needed (acid relfux).    . sildenafil (VIAGRA) 25 MG tablet Take 1 tablet (25 mg total) by mouth daily as needed. Take 2-3 tablets by mouth once daily as needed. 90 tablet 6   No current facility-administered medications for this visit.     SURGICAL HISTORY:  Past Surgical History:  Procedure Laterality Date  . CORONARY STENT PLACEMENT    . LEFT HEART CATHETERIZATION WITH CORONARY ANGIOGRAM N/A 03/03/2012   Procedure: LEFT HEART CATHETERIZATION WITH CORONARY ANGIOGRAM;  Surgeon: [name], MD;  Location: [facility] CATH LAB;  Service: Cardiovascular;  Laterality: N/A;  . PERCUTANEOUS CORONARY STENT INTERVENTION (PCI-S) N/A 03/04/2012   Procedure: PERCUTANEOUS CORONARY STENT INTERVENTION (PCI-S);  Surgeon: [name], MD;  Location: [facility] CATH LAB;  Service: Cardiovascular;  Laterality: N/A;    REVIEW OF SYSTEMS:  A comprehensive review of systems was negative.   PHYSICAL EXAMINATION: General appearance: alert, cooperative and no distress Head: Normocephalic, without obvious abnormality, atraumatic Neck: no adenopathy, no JVD, supple, symmetrical, trachea midline and thyroid not enlarged, symmetric, no tenderness/mass/nodules Lymph nodes: Cervical, supraclavicular, and axillary nodes normal. Resp: clear to  auscultation bilaterally Back: symmetric, no curvature. ROM normal. No CVA tenderness. Cardio: regular rate and rhythm, S1, S2 normal, no murmur, click, rub or gallop GI: soft, non-tender; bowel sounds normal; no masses,  no organomegaly Extremities: extremities normal, atraumatic, no cyanosis or edema  ECOG PERFORMANCE STATUS: 0 - Asymptomatic  Blood pressure 138/83, pulse 69, temperature 97.8 F (36.6 C), temperature source Oral, resp. rate 18, height 5\' 7"  (1.702 m), weight 157 lb 1.6 oz (71.3 kg), SpO2 100 %.  LABORATORY DATA: Lab Results  Component Value Date   WBC 3.6 (L) 12/13/2016   HGB 13.8 12/13/2016   HCT 41.2 12/13/2016   MCV 88.9 12/13/2016   PLT 218 12/13/2016      Chemistry      Component Value Date/Time   NA 141 10/11/2016 0802   K 4.2 10/11/2016 0802   CL 98 02/29/2016 1613   CL 106 08/06/2012 1244   CO2 26 10/11/2016 0802   BUN 11.8 10/11/2016 0802   CREATININE 1.0 10/11/2016 0802      Component Value Date/Time   CALCIUM 9.2 10/11/2016 0802   ALKPHOS 145 10/11/2016 0802   AST 26 10/11/2016 0802   ALT 22 10/11/2016 0802   BILITOT 0.49 10/11/2016 0802       RADIOGRAPHIC STUDIES: No results found.  ASSESSMENT AND PLAN:  This is a very pleasant 76 years old African-American male with bulky stage IV follicular lymphoma initially treated with CHOP/Rituxan followed by maintenance Rituxan. The patient had disease recurrence and he was restarted again on treatment with Rituxan every 2 months status post 15 cycles. The patient continues to tolerate this treatment fairly well with no significant adverse effects. I recommended for him to proceed with cycle #16 today as scheduled. I will see him back for follow-up visit in 2 months for evaluation with the next cycle of his treatment. He was advised to call immediately if he has any concerning symptoms in the interval. The patient voices understanding of current disease status and treatment options and is in  agreement with the current care plan. All questions were answered. The patient knows to call the clinic with any problems, questions or concerns. We can certainly see the patient much sooner if necessary. I spent 10 minutes counseling the patient face to face. The total time spent in the appointment was 15 minutes.  Disclaimer: This note was dictated with voice recognition software. Similar sounding words can inadvertently be transcribed and may not be corrected upon review.

## 2016-12-13 NOTE — Telephone Encounter (Signed)
Scheduled appt per 10/11 los - Gave patient AVS and calender per los.

## 2017-02-14 ENCOUNTER — Other Ambulatory Visit (HOSPITAL_BASED_OUTPATIENT_CLINIC_OR_DEPARTMENT_OTHER): Payer: Medicare Other

## 2017-02-14 ENCOUNTER — Ambulatory Visit (HOSPITAL_BASED_OUTPATIENT_CLINIC_OR_DEPARTMENT_OTHER): Payer: Medicare Other | Admitting: Oncology

## 2017-02-14 ENCOUNTER — Telehealth: Payer: Self-pay | Admitting: Internal Medicine

## 2017-02-14 ENCOUNTER — Encounter: Payer: Self-pay | Admitting: Oncology

## 2017-02-14 ENCOUNTER — Ambulatory Visit (HOSPITAL_BASED_OUTPATIENT_CLINIC_OR_DEPARTMENT_OTHER): Payer: Medicare Other

## 2017-02-14 VITALS — BP 112/71 | HR 79 | Temp 99.1°F | Resp 18 | Wt 160.6 lb

## 2017-02-14 VITALS — BP 124/65 | HR 63 | Temp 98.4°F | Resp 18

## 2017-02-14 DIAGNOSIS — C8208 Follicular lymphoma grade I, lymph nodes of multiple sites: Secondary | ICD-10-CM

## 2017-02-14 DIAGNOSIS — Z5112 Encounter for antineoplastic immunotherapy: Secondary | ICD-10-CM

## 2017-02-14 DIAGNOSIS — Z5111 Encounter for antineoplastic chemotherapy: Secondary | ICD-10-CM

## 2017-02-14 LAB — CBC WITH DIFFERENTIAL/PLATELET
BASO%: 0.5 % (ref 0.0–2.0)
Basophils Absolute: 0 10*3/uL (ref 0.0–0.1)
EOS ABS: 0.4 10*3/uL (ref 0.0–0.5)
EOS%: 9.6 % — ABNORMAL HIGH (ref 0.0–7.0)
HEMATOCRIT: 43.3 % (ref 38.4–49.9)
HGB: 14.6 g/dL (ref 13.0–17.1)
LYMPH#: 1.3 10*3/uL (ref 0.9–3.3)
LYMPH%: 31.6 % (ref 14.0–49.0)
MCH: 30 pg (ref 27.2–33.4)
MCHC: 33.7 g/dL (ref 32.0–36.0)
MCV: 88.9 fL (ref 79.3–98.0)
MONO#: 0.8 10*3/uL (ref 0.1–0.9)
MONO%: 18.5 % — ABNORMAL HIGH (ref 0.0–14.0)
NEUT%: 39.8 % (ref 39.0–75.0)
NEUTROS ABS: 1.6 10*3/uL (ref 1.5–6.5)
Platelets: 221 10*3/uL (ref 140–400)
RBC: 4.87 10*6/uL (ref 4.20–5.82)
RDW: 13.8 % (ref 11.0–14.6)
WBC: 4.1 10*3/uL (ref 4.0–10.3)

## 2017-02-14 LAB — COMPREHENSIVE METABOLIC PANEL
ALT: 27 U/L (ref 0–55)
AST: 29 U/L (ref 5–34)
Albumin: 3.9 g/dL (ref 3.5–5.0)
Alkaline Phosphatase: 138 U/L (ref 40–150)
Anion Gap: 11 mEq/L (ref 3–11)
BUN: 14.8 mg/dL (ref 7.0–26.0)
CHLORIDE: 107 meq/L (ref 98–109)
CO2: 22 meq/L (ref 22–29)
Calcium: 9.2 mg/dL (ref 8.4–10.4)
Creatinine: 1.1 mg/dL (ref 0.7–1.3)
EGFR: >60 mL/min/{1.73_m2} (ref >60)
Glucose: 89 mg/dl (ref 70–140)
POTASSIUM: 4.4 meq/L (ref 3.5–5.1)
SODIUM: 140 meq/L (ref 136–145)
Total Bilirubin: 0.61 mg/dL (ref 0.20–1.20)
Total Protein: 6.4 g/dL (ref 6.4–8.3)

## 2017-02-14 LAB — LACTATE DEHYDROGENASE: LDH: 233 U/L (ref 125–245)

## 2017-02-14 MED ORDER — ACETAMINOPHEN 325 MG PO TABS
ORAL_TABLET | ORAL | Status: AC
  Filled 2017-02-14: qty 2

## 2017-02-14 MED ORDER — DIPHENHYDRAMINE HCL 25 MG PO CAPS
ORAL_CAPSULE | ORAL | Status: AC
  Filled 2017-02-14: qty 2

## 2017-02-14 MED ORDER — SODIUM CHLORIDE 0.9 % IV SOLN
375.0000 mg/m2 | Freq: Once | INTRAVENOUS | Status: AC
  Administered 2017-02-14: 700 mg via INTRAVENOUS
  Filled 2017-02-14: qty 50

## 2017-02-14 MED ORDER — DIPHENHYDRAMINE HCL 25 MG PO CAPS
50.0000 mg | ORAL_CAPSULE | Freq: Once | ORAL | Status: AC
  Administered 2017-02-14: 50 mg via ORAL

## 2017-02-14 MED ORDER — SODIUM CHLORIDE 0.9 % IV SOLN
Freq: Once | INTRAVENOUS | Status: AC
  Administered 2017-02-14: 10:00:00 via INTRAVENOUS

## 2017-02-14 MED ORDER — ACETAMINOPHEN 325 MG PO TABS
650.0000 mg | ORAL_TABLET | Freq: Once | ORAL | Status: AC
  Administered 2017-02-14: 650 mg via ORAL

## 2017-02-14 NOTE — Telephone Encounter (Signed)
Gave avs and calendar for February 2019 °

## 2017-02-14 NOTE — Patient Instructions (Signed)
[facility] Discharge Instructions for Patients Receiving Chemotherapy  Today you received the following chemotherapy agents:  Rituxan.  To help prevent nausea and vomiting after your treatment, we encourage you to take your nausea medication as directed.   If you develop nausea and vomiting that is not controlled by your nausea medication, call the clinic.   BELOW ARE SYMPTOMS THAT SHOULD BE REPORTED IMMEDIATELY:  *FEVER GREATER THAN 100.5 F  *CHILLS WITH OR WITHOUT FEVER  NAUSEA AND VOMITING THAT IS NOT CONTROLLED WITH YOUR NAUSEA MEDICATION  *UNUSUAL SHORTNESS OF BREATH  *UNUSUAL BRUISING OR BLEEDING  TENDERNESS IN MOUTH AND THROAT WITH OR WITHOUT PRESENCE OF ULCERS  *URINARY PROBLEMS  *BOWEL PROBLEMS  UNUSUAL RASH Items with * indicate a potential emergency and should be followed up as soon as possible.  Feel free to call the clinic should you have any questions or concerns. The clinic phone number is [phone].  Please show the CHEMO ALERT CARD at check-in to the Emergency Department and triage nurse.   

## 2017-02-14 NOTE — Addendum Note (Signed)
Addended by: Aura Fey A on: 02/14/2017 10:18 AM   Modules accepted: Orders

## 2017-02-14 NOTE — Assessment & Plan Note (Signed)
This is a very pleasant 76 year old African-American male with bulky stage IV follicular lymphoma initially treated with CHOP/Rituxan followed by maintenance Rituxan. The patient had disease recurrence and he was restarted again on treatment with Rituxan every 2 months status post 16 cycles. The patient continues to tolerate this treatment fairly well with no significant adverse effects. I recommended for him to proceed with cycle #17 today as scheduled.  The patient is due for a restaging CT scan and I have ordered for this to be done in the next 1-2 weeks.  We will discuss this in detail with him at his next visit.  If there are any concerning findings, we will call him and bring him back for a follow-up visit sooner.  I will see him back for follow-up visit in 2 months for evaluation with the next cycle of his treatment.  He was advised to call immediately if he has any concerning symptoms in the interval. The patient voices understanding of current disease status and treatment options and is in agreement with the current care plan. All questions were answered. The patient knows to call the clinic with any problems, questions or concerns. We can certainly see the patient much sooner if necessary.

## 2017-02-14 NOTE — Progress Notes (Signed)
[place] OFFICE PROGRESS NOTE  [place], [place] Eric [name] [id number]  DIAGNOSIS: Bulky stage IV low-grade lymphoma diagnosed in November 2009.  PRIOR THERAPY: 1. Status post 7 cycles of systemic chemotherapy with CHOP/Rituxan. Last dose was given May 2010. 2. Status post 6 cycles of maintenance Rituxan 375 mg/sq m given every 2 months. Last dose was given on May 29, 2010, and the patient was lost to followup at that time. He received another dose on 12/11/2010, then again missed 2 doses. 3. systemic chemotherapy with Rituxan 375 mg/M2 on day 1 and that bendamustine 90 mg/M2 on days 1 and 2 every 4 weeks, status post 6 cycles. 4. Maintenance Rituxan 375 mg/M2 every 2 months, status post 12 cycles  CURRENT THERAPY: Rituxan 375 MG/M2 given weekly for 4 weeks and this is followed by maintenance treatment every 2 months, status post 16 cycles.  INTERVAL HISTORY: Eric [name] 76 y.o. male returns for routine follow-up visit by himself.  Patient is feeling fine today with no specific complaints.  He denies having any chest pain, shortness of breath, cough, hemoptysis.  He denies fevers and chills.  He has no nausea, vomiting, diarrhea, constipation.  Patient denies weight loss or night sweats.  The patient is here for evaluation prior to starting cycle #17 of his maintenance treatment with Rituxan.  MEDICAL HISTORY: Past Medical History:  Diagnosis Date  . Cancer ([place])   . Coronary atherosclerosis of native coronary artery   . Encounter for antineoplastic chemotherapy 06/20/2015  . GERD (gastroesophageal reflux disease)   . History of heart attack   . Hyperlipidemia   . Myocardial infarction ([place])   . Non Hodgkin's lymphoma ([place])     ALLERGIES:  is allergic to neosporin [neomycin-bacitracin zn-polymyx].  MEDICATIONS:  Current Outpatient Medications  Medication Sig Dispense Refill  . aspirin EC 81 MG tablet Take 81 mg by mouth at bedtime.    Marland Kitchen  atorvastatin (LIPITOR) 40 MG tablet Take 1 tablet (40 mg total) by mouth daily. 90 tablet 3  . diphenhydrAMINE (BENADRYL) 25 MG tablet Take 25 mg by mouth at bedtime as needed for sleep.     Marland Kitchen HYDROcodone-acetaminophen (NORCO/VICODIN) 5-325 MG tablet Take 1 tablet by mouth every 6 (six) hours as needed. 5 tablet 0  . nitroGLYCERIN (NITROSTAT) 0.4 MG SL tablet Place 1 tablet (0.4 mg total) under the tongue every 5 (five) minutes x 3 doses as needed for chest pain. (Patient not taking: Reported on 06/13/2016) 25 tablet 3  . omeprazole (PRILOSEC) 20 MG capsule Take 20 mg by mouth daily as needed (acid relfux).    . sildenafil (VIAGRA) 25 MG tablet Take 1 tablet (25 mg total) by mouth daily as needed. Take 2-3 tablets by mouth once daily as needed. 90 tablet 6   No current facility-administered medications for this visit.     SURGICAL HISTORY:  Past Surgical History:  Procedure Laterality Date  . CORONARY STENT PLACEMENT    . LEFT HEART CATHETERIZATION WITH CORONARY ANGIOGRAM N/A 03/03/2012   Procedure: LEFT HEART CATHETERIZATION WITH CORONARY ANGIOGRAM;  Surgeon: [name], MD;  Location: [facility] CATH LAB;  Service: Cardiovascular;  Laterality: N/A;  . PERCUTANEOUS CORONARY STENT INTERVENTION (PCI-S) N/A 03/04/2012   Procedure: PERCUTANEOUS CORONARY STENT INTERVENTION (PCI-S);  Surgeon: [name], MD;  Location: [facility] CATH LAB;  Service: Cardiovascular;  Laterality: N/A;    REVIEW OF SYSTEMS:   Review of Systems  Constitutional: Negative for appetite change, chills, fatigue,  fever and unexpected weight change.  HENT:   Negative for mouth sores, nosebleeds, sore throat and trouble swallowing.   Eyes: Negative for eye problems and icterus.  Respiratory: Negative for cough, hemoptysis, shortness of breath and wheezing.   Cardiovascular: Negative for chest pain and leg swelling.  Gastrointestinal: Negative for abdominal pain, constipation, diarrhea, nausea and vomiting.  Genitourinary: Negative  for bladder incontinence, difficulty urinating, dysuria, frequency and hematuria.   Musculoskeletal: Negative for back pain, gait problem, neck pain and neck stiffness.  Skin: Negative for itching and rash.  Neurological: Negative for dizziness, extremity weakness, gait problem, headaches, light-headedness and seizures.  Hematological: Negative for adenopathy. Does not bruise/bleed easily.  Psychiatric/Behavioral: Negative for confusion, depression and sleep disturbance. The patient is not nervous/anxious.     PHYSICAL EXAMINATION:  Blood pressure 112/71, pulse 79, temperature 99.1 F (37.3 C), temperature source Oral, resp. rate 18, weight 160 lb 9.6 oz (72.8 kg), SpO2 100 %.  ECOG PERFORMANCE STATUS: 0 - Asymptomatic  Physical Exam  Constitutional: Oriented to person, place, and time and well-developed, well-nourished, and in no distress. No distress.  HENT:  Head: Normocephalic and atraumatic.  Mouth/Throat: Oropharynx is clear and moist. No oropharyngeal exudate.  Eyes: Conjunctivae are normal. Right eye exhibits no discharge. Left eye exhibits no discharge. No scleral icterus.  Neck: Normal range of motion. Neck supple.  Cardiovascular: Normal rate, regular rhythm, normal heart sounds and intact distal pulses.   Pulmonary/Chest: Effort normal and breath sounds normal. No respiratory distress. No wheezes. No rales.  Abdominal: Soft. Bowel sounds are normal. Exhibits no distension and no mass. There is no tenderness.  Musculoskeletal: Normal range of motion. Exhibits no edema.  Lymphadenopathy:    No cervical adenopathy.  Neurological: Alert and oriented to person, place, and time. Exhibits normal muscle tone. Gait normal. Coordination normal.  Skin: Skin is warm and dry. No rash noted. Not diaphoretic. No erythema. No pallor.  Psychiatric: Mood, memory and judgment normal.  Vitals reviewed.  LABORATORY DATA: Lab Results  Component Value Date   WBC 4.1 02/14/2017   HGB 14.6  02/14/2017   HCT 43.3 02/14/2017   MCV 88.9 02/14/2017   PLT 221 02/14/2017      Chemistry      Component Value Date/Time   NA 140 02/14/2017 0804   K 4.4 02/14/2017 0804   CL 98 02/29/2016 1613   CL 106 08/06/2012 1244   CO2 22 02/14/2017 0804   BUN 14.8 02/14/2017 0804   CREATININE 1.1 02/14/2017 0804      Component Value Date/Time   CALCIUM 9.2 02/14/2017 0804   ALKPHOS 138 02/14/2017 0804   AST 29 02/14/2017 0804   ALT 27 02/14/2017 0804   BILITOT 0.61 02/14/2017 0804       RADIOGRAPHIC STUDIES:  No results found.   ASSESSMENT/PLAN:  Grade 1 follicular lymphoma of lymph nodes of multiple regions [facility]) This is a very pleasant 76 year old African-American male with bulky stage IV follicular lymphoma initially treated with CHOP/Rituxan followed by maintenance Rituxan. The patient had disease recurrence and he was restarted again on treatment with Rituxan every 2 months status post 16 cycles. The patient continues to tolerate this treatment fairly well with no significant adverse effects. I recommended for him to proceed with cycle #17 today as scheduled.  The patient is due for a restaging CT scan and I have ordered for this to be done in the next 1-2 weeks.  We will discuss this in detail with him  at his next visit.  If there are any concerning findings, we will call him and bring him back for a follow-up visit sooner.  I will see him back for follow-up visit in 2 months for evaluation with the next cycle of his treatment.  He was advised to call immediately if he has any concerning symptoms in the interval. The patient voices understanding of current disease status and treatment options and is in agreement with the current care plan. All questions were answered. The patient knows to call the clinic with any problems, questions or concerns. We can certainly see the patient much sooner if necessary.  Orders Placed This Encounter  Procedures  . CT ABDOMEN PELVIS W  CONTRAST    Standing Status:   Future    Standing Expiration Date:   02/14/2018    Order Specific Question:   If indicated for the ordered procedure, I authorize the administration of contrast media per Radiology protocol    Answer:   Yes    Order Specific Question:   Preferred imaging location?    Answer:   [facility]    Order Specific Question:   Radiology Contrast Protocol - do NOT remove file path    Answer:   [email or web address]    Order Specific Question:   Reason for Exam additional comments    Answer:   Stage IV lymphoma. Restaging.  . CT CHEST W CONTRAST    Standing Status:   Future    Standing Expiration Date:   02/14/2018    Order Specific Question:   If indicated for the ordered procedure, I authorize the administration of contrast media per Radiology protocol    Answer:   Yes    Order Specific Question:   Preferred imaging location?    Answer:   [facility]    Order Specific Question:   Radiology Contrast Protocol - do NOT remove file path    Answer:   [email or web address]    Order Specific Question:   Reason for Exam additional comments    Answer:   Stage IV lymphoma. Restaging.  Marland Kitchen CBC with Differential/Platelet    Standing Status:   Standing    Number of Occurrences:   3    Standing Expiration Date:   02/14/2018  . Comprehensive metabolic panel    Standing Status:   Standing    Number of Occurrences:   3    Standing Expiration Date:   02/14/2018  . Lactate dehydrogenase    Standing Status:   Standing    Number of Occurrences:   3    Standing Expiration Date:   02/14/2018     Mikey Bussing, DNP, AGPCNP-BC, AOCNP 02/14/17

## 2017-03-01 ENCOUNTER — Ambulatory Visit (HOSPITAL_COMMUNITY)
Admission: RE | Admit: 2017-03-01 | Discharge: 2017-03-01 | Disposition: A | Discharge status: Home / Self Care | Payer: Medicare Other | Source: Ambulatory Visit | Attending: Oncology | Admitting: Oncology

## 2017-03-01 DIAGNOSIS — C8590 Non-Hodgkin lymphoma, unspecified, unspecified site: Secondary | ICD-10-CM | POA: Diagnosis not present

## 2017-03-01 DIAGNOSIS — K449 Diaphragmatic hernia without obstruction or gangrene: Secondary | ICD-10-CM | POA: Diagnosis not present

## 2017-03-01 DIAGNOSIS — C8208 Follicular lymphoma grade I, lymph nodes of multiple sites: Secondary | ICD-10-CM

## 2017-03-01 DIAGNOSIS — I2510 Atherosclerotic heart disease of native coronary artery without angina pectoris: Secondary | ICD-10-CM | POA: Diagnosis not present

## 2017-03-01 DIAGNOSIS — I700 Atherosclerosis of aorta: Secondary | ICD-10-CM | POA: Diagnosis not present

## 2017-03-01 DIAGNOSIS — N400 Enlarged prostate without lower urinary tract symptoms: Secondary | ICD-10-CM | POA: Diagnosis not present

## 2017-03-01 MED ORDER — IOPAMIDOL (ISOVUE-300) INJECTION 61%
INTRAVENOUS | Status: AC
  Filled 2017-03-01: qty 100

## 2017-03-01 MED ORDER — IOPAMIDOL (ISOVUE-300) INJECTION 61%
100.0000 mL | Freq: Once | INTRAVENOUS | Status: AC | PRN
  Administered 2017-03-01: 100 mL via INTRAVENOUS

## 2017-04-18 ENCOUNTER — Inpatient Hospital Stay: Payer: Medicare Other

## 2017-04-18 ENCOUNTER — Inpatient Hospital Stay: Payer: Medicare Other | Attending: Internal Medicine | Admitting: Internal Medicine

## 2017-04-18 ENCOUNTER — Telehealth: Payer: Self-pay | Admitting: Internal Medicine

## 2017-04-18 ENCOUNTER — Encounter: Payer: Self-pay | Admitting: Internal Medicine

## 2017-04-18 VITALS — HR 74 | Temp 98.3°F | Resp 20 | Ht 67.0 in | Wt 165.2 lb

## 2017-04-18 VITALS — BP 112/68 | HR 69 | Temp 97.9°F | Resp 18

## 2017-04-18 DIAGNOSIS — Z7982 Long term (current) use of aspirin: Secondary | ICD-10-CM | POA: Diagnosis not present

## 2017-04-18 DIAGNOSIS — Z5111 Encounter for antineoplastic chemotherapy: Secondary | ICD-10-CM

## 2017-04-18 DIAGNOSIS — Z79899 Other long term (current) drug therapy: Secondary | ICD-10-CM

## 2017-04-18 DIAGNOSIS — Z5112 Encounter for antineoplastic immunotherapy: Secondary | ICD-10-CM | POA: Insufficient documentation

## 2017-04-18 DIAGNOSIS — C8290 Follicular lymphoma, unspecified, unspecified site: Secondary | ICD-10-CM

## 2017-04-18 DIAGNOSIS — C8208 Follicular lymphoma grade I, lymph nodes of multiple sites: Secondary | ICD-10-CM

## 2017-04-18 LAB — COMPREHENSIVE METABOLIC PANEL
ALBUMIN: 3.9 g/dL (ref 3.5–5.0)
ALK PHOS: 144 U/L (ref 40–150)
ALT: 30 U/L (ref 0–55)
ANION GAP: 9 (ref 3–11)
AST: 30 U/L (ref 5–34)
BUN: 22 mg/dL (ref 7–26)
CALCIUM: 9.4 mg/dL (ref 8.4–10.4)
CO2: 26 mmol/L (ref 22–29)
Chloride: 108 mmol/L (ref 98–109)
Creatinine, Ser: 0.99 mg/dL (ref 0.70–1.30)
GFR calc Af Amer: >60 mL/min (ref >60)
GFR calc non Af Amer: >60 mL/min (ref >60)
GLUCOSE: 86 mg/dL (ref 70–140)
Potassium: 4.1 mmol/L (ref 3.5–5.1)
SODIUM: 143 mmol/L (ref 136–145)
Total Bilirubin: 0.4 mg/dL (ref 0.2–1.2)
Total Protein: 6.5 g/dL (ref 6.4–8.3)

## 2017-04-18 LAB — CBC WITH DIFFERENTIAL/PLATELET
Basophils Absolute: 0 10*3/uL (ref 0.0–0.1)
Basophils Relative: 1 %
Eosinophils Absolute: 0.2 10*3/uL (ref 0.0–0.5)
Eosinophils Relative: 4 %
HEMATOCRIT: 43.5 % (ref 38.4–49.9)
Hemoglobin: 14.2 g/dL (ref 13.0–17.1)
LYMPHS ABS: 0.9 10*3/uL (ref 0.9–3.3)
LYMPHS PCT: 23 %
MCH: 29.4 pg (ref 27.2–33.4)
MCHC: 32.6 g/dL (ref 32.0–36.0)
MCV: 90.1 fL (ref 79.3–98.0)
MONO ABS: 0.8 10*3/uL (ref 0.1–0.9)
MONOS PCT: 20 %
NEUTROS ABS: 2.2 10*3/uL (ref 1.5–6.5)
Neutrophils Relative %: 52 %
Platelets: 224 10*3/uL (ref 140–400)
RBC: 4.83 MIL/uL (ref 4.20–5.82)
RDW: 14.2 % (ref 11.0–14.6)
WBC: 4.1 10*3/uL (ref 4.0–10.3)

## 2017-04-18 LAB — LACTATE DEHYDROGENASE: LDH: 263 U/L — AB (ref 125–245)

## 2017-04-18 MED ORDER — RITUXIMAB CHEMO INJECTION 500 MG/50ML
375.0000 mg/m2 | Freq: Once | INTRAVENOUS | Status: AC
  Administered 2017-04-18: 700 mg via INTRAVENOUS
  Filled 2017-04-18: qty 50

## 2017-04-18 MED ORDER — DIPHENHYDRAMINE HCL 25 MG PO CAPS
ORAL_CAPSULE | ORAL | Status: AC
  Filled 2017-04-18: qty 2

## 2017-04-18 MED ORDER — SODIUM CHLORIDE 0.9 % IV SOLN
Freq: Once | INTRAVENOUS | Status: AC
  Administered 2017-04-18: 11:00:00 via INTRAVENOUS

## 2017-04-18 MED ORDER — DIPHENHYDRAMINE HCL 25 MG PO CAPS
50.0000 mg | ORAL_CAPSULE | Freq: Once | ORAL | Status: AC
  Administered 2017-04-18: 50 mg via ORAL

## 2017-04-18 MED ORDER — ACETAMINOPHEN 325 MG PO TABS
650.0000 mg | ORAL_TABLET | Freq: Once | ORAL | Status: AC
  Administered 2017-04-18: 650 mg via ORAL

## 2017-04-18 MED ORDER — ACETAMINOPHEN 325 MG PO TABS
ORAL_TABLET | ORAL | Status: AC
  Filled 2017-04-18: qty 2

## 2017-04-18 NOTE — Patient Instructions (Signed)
[facility] Discharge Instructions for Patients Receiving Chemotherapy  Today you received the following chemotherapy agents:  Rituxan.  To help prevent nausea and vomiting after your treatment, we encourage you to take your nausea medication as directed.   If you develop nausea and vomiting that is not controlled by your nausea medication, call the clinic.   BELOW ARE SYMPTOMS THAT SHOULD BE REPORTED IMMEDIATELY:  *FEVER GREATER THAN 100.5 F  *CHILLS WITH OR WITHOUT FEVER  NAUSEA AND VOMITING THAT IS NOT CONTROLLED WITH YOUR NAUSEA MEDICATION  *UNUSUAL SHORTNESS OF BREATH  *UNUSUAL BRUISING OR BLEEDING  TENDERNESS IN MOUTH AND THROAT WITH OR WITHOUT PRESENCE OF ULCERS  *URINARY PROBLEMS  *BOWEL PROBLEMS  UNUSUAL RASH Items with * indicate a potential emergency and should be followed up as soon as possible.  Feel free to call the clinic should you have any questions or concerns. The clinic phone number is [phone].  Please show the CHEMO ALERT CARD at check-in to the Emergency Department and triage nurse.   

## 2017-04-18 NOTE — Telephone Encounter (Signed)
Patient aware of appt date and time per 2/14 los -reminder letter sent in the mail

## 2017-04-18 NOTE — Progress Notes (Signed)
[place] [phone]   Fax:(336) [phone]  OFFICE PROGRESS NOTE  DIAGNOSIS: Bulky stage IV low-grade lymphoma diagnosed in November 2009.  PRIOR THERAPY: 1. Status post 7 cycles of systemic chemotherapy with CHOP/Rituxan. Last dose was given May 2010. 2. Status post 6 cycles of maintenance Rituxan 375 mg/sq m given every 2 months. Last dose was given on May 29, 2010, and the patient was lost to followup at that time. He received another dose on 12/11/2010, then again missed 2 doses. 3. systemic chemotherapy with Rituxan 375 mg/M2 on day 1 and that bendamustine 90 mg/M2 on days 1 and 2 every 4 weeks, status post 6 cycles. 4. Maintenance Rituxan 375 mg/M2 every 2 months, status post 12 cycles 5.  CURRENT THERAPY: Rituxan 375 MG/M2 given weekly for 4 weeks and this is followed by maintenance treatment every 2 months, status post 17 cycles.  INTERVAL HISTORY: Eric [name] 77 y.o. male returns to the clinic today for 2 months follow-up visit.  The patient is feeling fine today with no specific complaints.  He denied having any chest pain, shortness breath, cough or hemoptysis.  He has no recent weight loss or night sweats.  He has no nausea, vomiting, diarrhea or constipation.  He continues to tolerate his treatment with maintenance Rituxan fairly well.  He is here today for evaluation before starting cycle #18.  MEDICAL HISTORY: Past Medical History:  Diagnosis Date  . Cancer ([place])   . Coronary atherosclerosis of native coronary artery   . Encounter for antineoplastic chemotherapy 06/20/2015  . GERD (gastroesophageal reflux disease)   . History of heart attack   . Hyperlipidemia   . Myocardial infarction ([place])   . Non Hodgkin's lymphoma ([place])     ALLERGIES:  is allergic to neosporin [neomycin-bacitracin zn-polymyx].  MEDICATIONS:  Current Outpatient Medications  Medication Sig Dispense Refill  . aspirin EC 81 MG tablet Take 81 mg by mouth at  bedtime.    Marland Kitchen atorvastatin (LIPITOR) 40 MG tablet Take 1 tablet (40 mg total) by mouth daily. 90 tablet 3  . diphenhydrAMINE (BENADRYL) 25 MG tablet Take 25 mg by mouth at bedtime as needed for sleep.     Marland Kitchen omeprazole (PRILOSEC) 20 MG capsule Take 20 mg by mouth daily as needed (acid relfux).    . sildenafil (VIAGRA) 25 MG tablet Take 1 tablet (25 mg total) by mouth daily as needed. Take 2-3 tablets by mouth once daily as needed. 90 tablet 6   No current facility-administered medications for this visit.     SURGICAL HISTORY:  Past Surgical History:  Procedure Laterality Date  . CORONARY STENT PLACEMENT    . LEFT HEART CATHETERIZATION WITH CORONARY ANGIOGRAM N/A 03/03/2012   Procedure: LEFT HEART CATHETERIZATION WITH CORONARY ANGIOGRAM;  Surgeon: [name], MD;  Location: [facility] CATH LAB;  Service: Cardiovascular;  Laterality: N/A;  . PERCUTANEOUS CORONARY STENT INTERVENTION (PCI-S) N/A 03/04/2012   Procedure: PERCUTANEOUS CORONARY STENT INTERVENTION (PCI-S);  Surgeon: [name], MD;  Location: [facility] CATH LAB;  Service: Cardiovascular;  Laterality: N/A;    REVIEW OF SYSTEMS:  A comprehensive review of systems was negative.   PHYSICAL EXAMINATION: General appearance: alert, cooperative and no distress Head: Normocephalic, without obvious abnormality, atraumatic Neck: no adenopathy, no JVD, supple, symmetrical, trachea midline and thyroid not enlarged, symmetric, no tenderness/mass/nodules Lymph nodes: Cervical, supraclavicular, and axillary nodes normal. Resp: clear to auscultation bilaterally Back: symmetric, no curvature. ROM normal. No CVA tenderness. Cardio: regular  rate and rhythm, S1, S2 normal, no murmur, click, rub or gallop GI: soft, non-tender; bowel sounds normal; no masses,  no organomegaly Extremities: extremities normal, atraumatic, no cyanosis or edema  ECOG PERFORMANCE STATUS: 0 - Asymptomatic  Pulse 74, temperature 98.3 F (36.8 C), temperature source Oral, resp.  rate 20, height 5\' 7"  (1.702 m), weight 165 lb 4 oz (75 kg), SpO2 100 %.  LABORATORY DATA: Lab Results  Component Value Date   WBC 4.1 04/18/2017   HGB 14.2 04/18/2017   HCT 43.5 04/18/2017   MCV 90.1 04/18/2017   PLT 224 04/18/2017      Chemistry      Component Value Date/Time   NA 140 02/14/2017 0804   K 4.4 02/14/2017 0804   CL 98 02/29/2016 1613   CL 106 08/06/2012 1244   CO2 22 02/14/2017 0804   BUN 14.8 02/14/2017 0804   CREATININE 1.1 02/14/2017 0804      Component Value Date/Time   CALCIUM 9.2 02/14/2017 0804   ALKPHOS 138 02/14/2017 0804   AST 29 02/14/2017 0804   ALT 27 02/14/2017 0804   BILITOT 0.61 02/14/2017 0804       RADIOGRAPHIC STUDIES: No results found.  ASSESSMENT AND PLAN:  This is a very pleasant 77 years old African-American male with bulky stage IV follicular lymphoma initially treated with CHOP/Rituxan followed by maintenance Rituxan. The patient had disease recurrence and he was restarted again on treatment with Rituxan every 2 months status post 17 cycles. The patient continues to tolerate this maintenance treatment well. I recommended for him to proceed with cycle #18 today as a scheduled. I will see him back for follow-up visit in 2 months for evaluation before starting cycle #19. He was advised to call immediately if he has any concerning symptoms in the interval. The patient voices understanding of current disease status and treatment options and is in agreement with the current care plan. All questions were answered. The patient knows to call the clinic with any problems, questions or concerns. We can certainly see the patient much sooner if necessary. I spent 10 minutes counseling the patient face to face. The total time spent in the appointment was 15 minutes.  Disclaimer: This note was dictated with voice recognition software. Similar sounding words can inadvertently be transcribed and may not be corrected upon review.

## 2017-05-03 DIAGNOSIS — R310 Gross hematuria: Secondary | ICD-10-CM | POA: Diagnosis not present

## 2017-05-03 DIAGNOSIS — N401 Enlarged prostate with lower urinary tract symptoms: Secondary | ICD-10-CM | POA: Diagnosis not present

## 2017-05-03 DIAGNOSIS — N138 Other obstructive and reflux uropathy: Secondary | ICD-10-CM | POA: Diagnosis not present

## 2017-05-03 DIAGNOSIS — R828 Abnormal findings on cytological and histological examination of urine: Secondary | ICD-10-CM | POA: Diagnosis not present

## 2017-05-03 DIAGNOSIS — N4889 Other specified disorders of penis: Secondary | ICD-10-CM | POA: Diagnosis not present

## 2017-05-03 DIAGNOSIS — R972 Elevated prostate specific antigen [PSA]: Secondary | ICD-10-CM | POA: Diagnosis not present

## 2017-05-17 ENCOUNTER — Other Ambulatory Visit: Payer: Self-pay | Admitting: Family Medicine

## 2017-05-17 NOTE — Telephone Encounter (Signed)
Atorvastatin refill Last OV: Telephone call dated 04/03/16 Last Refill:04/03/16 #90 3 RF Pharmacy:[facility] [address] PCP: Reginia Forts MD Last lipid level: 02/29/16   Last liver level: 04/18/17

## 2017-06-20 ENCOUNTER — Inpatient Hospital Stay: Payer: Medicare Other

## 2017-06-20 ENCOUNTER — Inpatient Hospital Stay (HOSPITAL_BASED_OUTPATIENT_CLINIC_OR_DEPARTMENT_OTHER): Payer: Medicare Other | Admitting: Internal Medicine

## 2017-06-20 ENCOUNTER — Telehealth: Payer: Self-pay | Admitting: Internal Medicine

## 2017-06-20 ENCOUNTER — Inpatient Hospital Stay: Payer: Medicare Other | Attending: Internal Medicine

## 2017-06-20 ENCOUNTER — Encounter: Payer: Self-pay | Admitting: Internal Medicine

## 2017-06-20 VITALS — BP 133/84 | HR 63 | Temp 98.5°F | Resp 17 | Ht 67.0 in | Wt 153.4 lb

## 2017-06-20 VITALS — BP 112/62 | HR 68 | Temp 97.7°F | Resp 16

## 2017-06-20 DIAGNOSIS — Z7982 Long term (current) use of aspirin: Secondary | ICD-10-CM

## 2017-06-20 DIAGNOSIS — Z5112 Encounter for antineoplastic immunotherapy: Secondary | ICD-10-CM | POA: Insufficient documentation

## 2017-06-20 DIAGNOSIS — C8208 Follicular lymphoma grade I, lymph nodes of multiple sites: Secondary | ICD-10-CM

## 2017-06-20 DIAGNOSIS — Z79899 Other long term (current) drug therapy: Secondary | ICD-10-CM | POA: Diagnosis not present

## 2017-06-20 DIAGNOSIS — I252 Old myocardial infarction: Secondary | ICD-10-CM

## 2017-06-20 DIAGNOSIS — Z9221 Personal history of antineoplastic chemotherapy: Secondary | ICD-10-CM | POA: Insufficient documentation

## 2017-06-20 LAB — COMPREHENSIVE METABOLIC PANEL
ALT: 16 U/L (ref 0–55)
AST: 22 U/L (ref 5–34)
Albumin: 3.8 g/dL (ref 3.5–5.0)
Alkaline Phosphatase: 122 U/L (ref 40–150)
Anion gap: 7 (ref 3–11)
BILIRUBIN TOTAL: 0.4 mg/dL (ref 0.2–1.2)
BUN: 18 mg/dL (ref 7–26)
CALCIUM: 9.2 mg/dL (ref 8.4–10.4)
CHLORIDE: 108 mmol/L (ref 98–109)
CO2: 25 mmol/L (ref 22–29)
CREATININE: 1.04 mg/dL (ref 0.70–1.30)
Glucose, Bld: 84 mg/dL (ref 70–140)
Potassium: 4.2 mmol/L (ref 3.5–5.1)
Sodium: 140 mmol/L (ref 136–145)
TOTAL PROTEIN: 6.1 g/dL — AB (ref 6.4–8.3)

## 2017-06-20 LAB — CBC WITH DIFFERENTIAL/PLATELET
BASOS ABS: 0 10*3/uL (ref 0.0–0.1)
BASOS PCT: 1 %
EOS ABS: 0.3 10*3/uL (ref 0.0–0.5)
EOS PCT: 8 %
HCT: 40.9 % (ref 38.4–49.9)
Hemoglobin: 13.6 g/dL (ref 13.0–17.1)
Lymphocytes Relative: 32 %
Lymphs Abs: 1.1 10*3/uL (ref 0.9–3.3)
MCH: 29.2 pg (ref 27.2–33.4)
MCHC: 33.3 g/dL (ref 32.0–36.0)
MCV: 87.6 fL (ref 79.3–98.0)
Monocytes Absolute: 0.6 10*3/uL (ref 0.1–0.9)
Monocytes Relative: 18 %
NEUTROS PCT: 41 %
Neutro Abs: 1.5 10*3/uL (ref 1.5–6.5)
PLATELETS: 207 10*3/uL (ref 140–400)
RBC: 4.67 MIL/uL (ref 4.20–5.82)
RDW: 14.1 % (ref 11.0–14.6)
WBC: 3.5 10*3/uL — AB (ref 4.0–10.3)

## 2017-06-20 LAB — LACTATE DEHYDROGENASE: LDH: 209 U/L (ref 125–245)

## 2017-06-20 MED ORDER — ACETAMINOPHEN 325 MG PO TABS
650.0000 mg | ORAL_TABLET | Freq: Once | ORAL | Status: AC
  Administered 2017-06-20: 650 mg via ORAL

## 2017-06-20 MED ORDER — RITUXIMAB CHEMO INJECTION 500 MG/50ML
375.0000 mg/m2 | Freq: Once | INTRAVENOUS | Status: AC
  Administered 2017-06-20: 700 mg via INTRAVENOUS
  Filled 2017-06-20: qty 50

## 2017-06-20 MED ORDER — DIPHENHYDRAMINE HCL 25 MG PO CAPS
ORAL_CAPSULE | ORAL | Status: AC
  Filled 2017-06-20: qty 2

## 2017-06-20 MED ORDER — DIPHENHYDRAMINE HCL 25 MG PO CAPS
50.0000 mg | ORAL_CAPSULE | Freq: Once | ORAL | Status: AC
  Administered 2017-06-20: 50 mg via ORAL

## 2017-06-20 MED ORDER — SODIUM CHLORIDE 0.9 % IV SOLN
Freq: Once | INTRAVENOUS | Status: AC
  Administered 2017-06-20: 09:00:00 via INTRAVENOUS

## 2017-06-20 MED ORDER — ACETAMINOPHEN 325 MG PO TABS
ORAL_TABLET | ORAL | Status: AC
  Filled 2017-06-20: qty 2

## 2017-06-20 NOTE — Patient Instructions (Signed)
[place] Discharge Instructions for Patients Receiving Chemotherapy  Today you received the following chemotherapy agents rituxan   To help prevent nausea and vomiting after your treatment, we encourage you to take your nausea medication as directed  If you develop nausea and vomiting that is not controlled by your nausea medication, call the clinic.   BELOW ARE SYMPTOMS THAT SHOULD BE REPORTED IMMEDIATELY:  *FEVER GREATER THAN 100.5 F  *CHILLS WITH OR WITHOUT FEVER  NAUSEA AND VOMITING THAT IS NOT CONTROLLED WITH YOUR NAUSEA MEDICATION  *UNUSUAL SHORTNESS OF BREATH  *UNUSUAL BRUISING OR BLEEDING  TENDERNESS IN MOUTH AND THROAT WITH OR WITHOUT PRESENCE OF ULCERS  *URINARY PROBLEMS  *BOWEL PROBLEMS  UNUSUAL RASH Items with * indicate a potential emergency and should be followed up as soon as possible.  Feel free to call the clinic you have any questions or concerns. The clinic phone number is [phone].

## 2017-06-20 NOTE — Telephone Encounter (Signed)
Scheduled appt per 4/18 los- Gave patient AVS and calender per [facility] radiology to contact patient with ct.

## 2017-06-20 NOTE — Progress Notes (Signed)
[place] Telephone:(336) [phone]   Fax:([phone]  OFFICE PROGRESS NOTE  DIAGNOSIS: Bulky stage IV low-grade lymphoma diagnosed in November 2009.  PRIOR THERAPY: 1. Status post 7 cycles of systemic chemotherapy with CHOP/Rituxan. Last dose was given May 2010. 2. Status post 6 cycles of maintenance Rituxan 375 mg/sq m given every 2 months. Last dose was given on May 29, 2010, and the patient was lost to followup at that time. He received another dose on 12/11/2010, then again missed 2 doses. 3. systemic chemotherapy with Rituxan 375 mg/M2 on day 1 and that bendamustine 90 mg/M2 on days 1 and 2 every 4 weeks, status post 6 cycles. 4. Maintenance Rituxan 375 mg/M2 every 2 months, status post 12 cycles 5.  CURRENT THERAPY: Rituxan 375 MG/M2 given weekly for 4 weeks and this is followed by maintenance treatment every 2 months, status post 18 cycles.  INTERVAL HISTORY: Eric [name] 77 y.o. male returns to the clinic today for follow-up visit.  The patient is feeling fine with no specific complaints.  He denied having any recent weight loss or night sweats.  He has no nausea, vomiting, diarrhea or constipation.  He denied having any chest pain, shortness breath, cough or hemoptysis.  He denied having any fever or chills.  The patient has been tolerating his maintenance treatment with Rituxan fairly well.  He is here today for evaluation before starting cycle #19.  MEDICAL HISTORY: Past Medical History:  Diagnosis Date  . Cancer ([place])   . Coronary atherosclerosis of native coronary artery   . Encounter for antineoplastic chemotherapy 06/20/2015  . GERD (gastroesophageal reflux disease)   . History of heart attack   . Hyperlipidemia   . Myocardial infarction ([place])   . Non Hodgkin's lymphoma ([place])     ALLERGIES:  is allergic to neosporin [neomycin-bacitracin zn-polymyx].  MEDICATIONS:  Current Outpatient Medications  Medication Sig Dispense Refill  . aspirin  EC 81 MG tablet Take 81 mg by mouth at bedtime.    Marland Kitchen atorvastatin (LIPITOR) 40 MG tablet TAKE 1 TABLET (40 MG TOTAL) BY MOUTH DAILY. 30 tablet 1  . diphenhydrAMINE (BENADRYL) 25 MG tablet Take 25 mg by mouth at bedtime as needed for sleep.     Marland Kitchen omeprazole (PRILOSEC) 20 MG capsule Take 20 mg by mouth daily as needed (acid relfux).    . sildenafil (VIAGRA) 25 MG tablet Take 1 tablet (25 mg total) by mouth daily as needed. Take 2-3 tablets by mouth once daily as needed. 90 tablet 6   No current facility-administered medications for this visit.     SURGICAL HISTORY:  Past Surgical History:  Procedure Laterality Date  . CORONARY STENT PLACEMENT    . LEFT HEART CATHETERIZATION WITH CORONARY ANGIOGRAM N/A 03/03/2012   Procedure: LEFT HEART CATHETERIZATION WITH CORONARY ANGIOGRAM;  Surgeon: [name], MD;  Location: [facility] CATH LAB;  Service: Cardiovascular;  Laterality: N/A;  . PERCUTANEOUS CORONARY STENT INTERVENTION (PCI-S) N/A 03/04/2012   Procedure: PERCUTANEOUS CORONARY STENT INTERVENTION (PCI-S);  Surgeon: [name], MD;  Location: [facility] CATH LAB;  Service: Cardiovascular;  Laterality: N/A;    REVIEW OF SYSTEMS:  A comprehensive review of systems was negative.   PHYSICAL EXAMINATION: General appearance: alert, cooperative and no distress Head: Normocephalic, without obvious abnormality, atraumatic Neck: no adenopathy, no JVD, supple, symmetrical, trachea midline and thyroid not enlarged, symmetric, no tenderness/mass/nodules Lymph nodes: Cervical, supraclavicular, and axillary nodes normal. Resp: clear to auscultation bilaterally Back: symmetric, no curvature.  ROM normal. No CVA tenderness. Cardio: regular rate and rhythm, S1, S2 normal, no murmur, click, rub or gallop GI: soft, non-tender; bowel sounds normal; no masses,  no organomegaly Extremities: extremities normal, atraumatic, no cyanosis or edema  ECOG PERFORMANCE STATUS: 0 - Asymptomatic  Blood pressure 133/84, pulse 63,  temperature 98.5 F (36.9 C), temperature source Oral, resp. rate 17, height 5\' 7"  (1.702 m), weight 153 lb 6.4 oz (69.6 kg), SpO2 100 %.  LABORATORY DATA: Lab Results  Component Value Date   WBC 3.5 (L) 06/20/2017   HGB 13.6 06/20/2017   HCT 40.9 06/20/2017   MCV 87.6 06/20/2017   PLT 207 06/20/2017      Chemistry      Component Value Date/Time   NA 143 04/18/2017 0915   NA 140 02/14/2017 0804   K 4.1 04/18/2017 0915   K 4.4 02/14/2017 0804   CL 108 04/18/2017 0915   CL 106 08/06/2012 1244   CO2 26 04/18/2017 0915   CO2 22 02/14/2017 0804   BUN 22 04/18/2017 0915   BUN 14.8 02/14/2017 0804   CREATININE 0.99 04/18/2017 0915   CREATININE 1.1 02/14/2017 0804      Component Value Date/Time   CALCIUM 9.4 04/18/2017 0915   CALCIUM 9.2 02/14/2017 0804   ALKPHOS 144 04/18/2017 0915   ALKPHOS 138 02/14/2017 0804   AST 30 04/18/2017 0915   AST 29 02/14/2017 0804   ALT 30 04/18/2017 0915   ALT 27 02/14/2017 0804   BILITOT 0.4 04/18/2017 0915   BILITOT 0.61 02/14/2017 0804       RADIOGRAPHIC STUDIES: No results found.  ASSESSMENT AND PLAN:  This is a very pleasant 77 years old African-American male with bulky stage IV follicular lymphoma initially treated with CHOP/Rituxan followed by maintenance Rituxan. The patient had disease recurrence and he was restarted again on treatment with Rituxan every 2 months status post 18 cycles. He continues to tolerate this treatment well with no concerning complaints.  I recommended for him to proceed with cycle #19 today as a scheduled. I will see him back for follow-up visit in 2 months for evaluation after repeating CT scan of the chest, abdomen pelvis for restaging of his disease. The patient was advised to call immediately if he has any concerning symptoms in the interval. The patient voices understanding of current disease status and treatment options and is in agreement with the current care plan. All questions were answered. The  patient knows to call the clinic with any problems, questions or concerns. We can certainly see the patient much sooner if necessary. I spent 10 minutes counseling the patient face to face. The total time spent in the appointment was 15 minutes.  Disclaimer: This note was dictated with voice recognition software. Similar sounding words can inadvertently be transcribed and may not be corrected upon review.

## 2017-06-21 DIAGNOSIS — N138 Other obstructive and reflux uropathy: Secondary | ICD-10-CM | POA: Diagnosis not present

## 2017-06-21 DIAGNOSIS — R310 Gross hematuria: Secondary | ICD-10-CM | POA: Diagnosis not present

## 2017-06-21 DIAGNOSIS — N401 Enlarged prostate with lower urinary tract symptoms: Secondary | ICD-10-CM | POA: Diagnosis not present

## 2017-06-21 DIAGNOSIS — R972 Elevated prostate specific antigen [PSA]: Secondary | ICD-10-CM | POA: Diagnosis not present

## 2017-07-31 ENCOUNTER — Encounter: Payer: Self-pay | Admitting: Family Medicine

## 2017-08-16 ENCOUNTER — Ambulatory Visit (HOSPITAL_COMMUNITY)
Admission: RE | Admit: 2017-08-16 | Discharge: 2017-08-16 | Disposition: A | Discharge status: Home / Self Care | Payer: Medicare Other | Source: Ambulatory Visit | Attending: Internal Medicine | Admitting: Internal Medicine

## 2017-08-16 ENCOUNTER — Inpatient Hospital Stay: Payer: Medicare Other | Attending: Internal Medicine

## 2017-08-16 DIAGNOSIS — Z5112 Encounter for antineoplastic immunotherapy: Secondary | ICD-10-CM | POA: Diagnosis not present

## 2017-08-16 DIAGNOSIS — Z9221 Personal history of antineoplastic chemotherapy: Secondary | ICD-10-CM | POA: Diagnosis not present

## 2017-08-16 DIAGNOSIS — Z5111 Encounter for antineoplastic chemotherapy: Secondary | ICD-10-CM | POA: Diagnosis not present

## 2017-08-16 DIAGNOSIS — I252 Old myocardial infarction: Secondary | ICD-10-CM | POA: Diagnosis not present

## 2017-08-16 DIAGNOSIS — Z7982 Long term (current) use of aspirin: Secondary | ICD-10-CM | POA: Diagnosis not present

## 2017-08-16 DIAGNOSIS — C8590 Non-Hodgkin lymphoma, unspecified, unspecified site: Secondary | ICD-10-CM | POA: Diagnosis not present

## 2017-08-16 DIAGNOSIS — C8208 Follicular lymphoma grade I, lymph nodes of multiple sites: Secondary | ICD-10-CM | POA: Insufficient documentation

## 2017-08-16 DIAGNOSIS — Z79899 Other long term (current) drug therapy: Secondary | ICD-10-CM | POA: Diagnosis not present

## 2017-08-16 LAB — CMP (CANCER CENTER ONLY)
ALT: 21 U/L (ref 0–55)
AST: 27 U/L (ref 5–34)
Albumin: 3.9 g/dL (ref 3.5–5.0)
Alkaline Phosphatase: 121 U/L (ref 40–150)
Anion gap: 9 (ref 3–11)
BUN: 19 mg/dL (ref 7–26)
CHLORIDE: 106 mmol/L (ref 98–109)
CO2: 25 mmol/L (ref 22–29)
Calcium: 9.1 mg/dL (ref 8.4–10.4)
Creatinine: 1.01 mg/dL (ref 0.70–1.30)
Glucose, Bld: 84 mg/dL (ref 70–140)
POTASSIUM: 4.2 mmol/L (ref 3.5–5.1)
Sodium: 140 mmol/L (ref 136–145)
Total Bilirubin: 0.4 mg/dL (ref 0.2–1.2)
Total Protein: 6 g/dL — ABNORMAL LOW (ref 6.4–8.3)

## 2017-08-16 LAB — CBC WITH DIFFERENTIAL (CANCER CENTER ONLY)
BASOS ABS: 0.1 10*3/uL (ref 0.0–0.1)
Basophils Relative: 3 %
EOS PCT: 8 %
Eosinophils Absolute: 0.3 10*3/uL (ref 0.0–0.5)
HCT: 40.4 % (ref 38.4–49.9)
Hemoglobin: 13.4 g/dL (ref 13.0–17.1)
LYMPHS ABS: 1.1 10*3/uL (ref 0.9–3.3)
Lymphocytes Relative: 30 %
MCH: 28.6 pg (ref 27.2–33.4)
MCHC: 33 g/dL (ref 32.0–36.0)
MCV: 86.4 fL (ref 79.3–98.0)
MONO ABS: 0.6 10*3/uL (ref 0.1–0.9)
Monocytes Relative: 18 %
Neutro Abs: 1.5 10*3/uL (ref 1.5–6.5)
Neutrophils Relative %: 41 %
PLATELETS: 215 10*3/uL (ref 140–400)
RBC: 4.68 MIL/uL (ref 4.20–5.82)
RDW: 14.4 % (ref 11.0–14.6)
WBC Count: 3.5 10*3/uL — ABNORMAL LOW (ref 4.0–10.3)

## 2017-08-16 LAB — LACTATE DEHYDROGENASE: LDH: 254 U/L — ABNORMAL HIGH (ref 125–245)

## 2017-08-16 MED ORDER — IOPAMIDOL (ISOVUE-300) INJECTION 61%
100.0000 mL | Freq: Once | INTRAVENOUS | Status: AC | PRN
  Administered 2017-08-16: 100 mL via INTRAVENOUS

## 2017-08-16 MED ORDER — IOPAMIDOL (ISOVUE-300) INJECTION 61%
INTRAVENOUS | Status: AC
  Filled 2017-08-16: qty 100

## 2017-08-22 ENCOUNTER — Encounter: Payer: Self-pay | Admitting: Internal Medicine

## 2017-08-22 ENCOUNTER — Inpatient Hospital Stay: Payer: Medicare Other | Admitting: Internal Medicine

## 2017-08-22 ENCOUNTER — Inpatient Hospital Stay: Payer: Medicare Other

## 2017-08-22 ENCOUNTER — Telehealth: Payer: Self-pay

## 2017-08-22 VITALS — BP 140/82 | HR 70 | Temp 98.0°F | Resp 18 | Ht 67.0 in | Wt 160.0 lb

## 2017-08-22 VITALS — BP 122/73 | HR 60 | Temp 97.7°F | Resp 16

## 2017-08-22 DIAGNOSIS — Z7982 Long term (current) use of aspirin: Secondary | ICD-10-CM

## 2017-08-22 DIAGNOSIS — Z79899 Other long term (current) drug therapy: Secondary | ICD-10-CM

## 2017-08-22 DIAGNOSIS — Z5112 Encounter for antineoplastic immunotherapy: Secondary | ICD-10-CM | POA: Diagnosis not present

## 2017-08-22 DIAGNOSIS — I252 Old myocardial infarction: Secondary | ICD-10-CM

## 2017-08-22 DIAGNOSIS — Z9221 Personal history of antineoplastic chemotherapy: Secondary | ICD-10-CM

## 2017-08-22 DIAGNOSIS — C8208 Follicular lymphoma grade I, lymph nodes of multiple sites: Secondary | ICD-10-CM

## 2017-08-22 DIAGNOSIS — Z5111 Encounter for antineoplastic chemotherapy: Secondary | ICD-10-CM

## 2017-08-22 LAB — CBC WITH DIFFERENTIAL/PLATELET
BASOS PCT: 1 %
Basophils Absolute: 0 10*3/uL (ref 0.0–0.1)
Eosinophils Absolute: 0.4 10*3/uL (ref 0.0–0.5)
Eosinophils Relative: 9 %
HCT: 40.9 % (ref 38.4–49.9)
Hemoglobin: 13.3 g/dL (ref 13.0–17.1)
Lymphocytes Relative: 36 %
Lymphs Abs: 1.5 10*3/uL (ref 0.9–3.3)
MCH: 28.4 pg (ref 27.2–33.4)
MCHC: 32.5 g/dL (ref 32.0–36.0)
MCV: 87.4 fL (ref 79.3–98.0)
MONO ABS: 0.7 10*3/uL (ref 0.1–0.9)
MONOS PCT: 17 %
NEUTROS PCT: 37 %
Neutro Abs: 1.6 10*3/uL (ref 1.5–6.5)
Platelets: 211 10*3/uL (ref 140–400)
RBC: 4.68 MIL/uL (ref 4.20–5.82)
RDW: 14.4 % (ref 11.0–14.6)
WBC: 4.2 10*3/uL (ref 4.0–10.3)

## 2017-08-22 LAB — COMPREHENSIVE METABOLIC PANEL
ALBUMIN: 3.7 g/dL (ref 3.5–5.0)
ALT: 24 U/L (ref 0–55)
ANION GAP: 8 (ref 3–11)
AST: 26 U/L (ref 5–34)
Alkaline Phosphatase: 107 U/L (ref 40–150)
BUN: 15 mg/dL (ref 7–26)
CO2: 26 mmol/L (ref 22–29)
Calcium: 8.7 mg/dL (ref 8.4–10.4)
Chloride: 108 mmol/L (ref 98–109)
Creatinine, Ser: 1.17 mg/dL (ref 0.70–1.30)
GFR calc Af Amer: >60 mL/min (ref >60)
GFR calc non Af Amer: 59 mL/min — ABNORMAL LOW (ref >60)
GLUCOSE: 86 mg/dL (ref 70–140)
POTASSIUM: 4.1 mmol/L (ref 3.5–5.1)
SODIUM: 142 mmol/L (ref 136–145)
Total Bilirubin: 0.4 mg/dL (ref 0.2–1.2)
Total Protein: 5.9 g/dL — ABNORMAL LOW (ref 6.4–8.3)

## 2017-08-22 MED ORDER — ACETAMINOPHEN 325 MG PO TABS
650.0000 mg | ORAL_TABLET | Freq: Once | ORAL | Status: AC
  Administered 2017-08-22: 650 mg via ORAL

## 2017-08-22 MED ORDER — DIPHENHYDRAMINE HCL 25 MG PO CAPS
ORAL_CAPSULE | ORAL | Status: AC
  Filled 2017-08-22: qty 2

## 2017-08-22 MED ORDER — SODIUM CHLORIDE 0.9 % IV SOLN
375.0000 mg/m2 | Freq: Once | INTRAVENOUS | Status: AC
  Administered 2017-08-22: 700 mg via INTRAVENOUS
  Filled 2017-08-22: qty 50

## 2017-08-22 MED ORDER — ACETAMINOPHEN 325 MG PO TABS
ORAL_TABLET | ORAL | Status: AC
  Filled 2017-08-22: qty 2

## 2017-08-22 MED ORDER — SODIUM CHLORIDE 0.9 % IV SOLN
Freq: Once | INTRAVENOUS | Status: AC
  Administered 2017-08-22: 09:00:00 via INTRAVENOUS

## 2017-08-22 MED ORDER — DIPHENHYDRAMINE HCL 25 MG PO CAPS
50.0000 mg | ORAL_CAPSULE | Freq: Once | ORAL | Status: AC
  Administered 2017-08-22: 50 mg via ORAL

## 2017-08-22 NOTE — Telephone Encounter (Signed)
Printed avs and calender of upcoming appointment. Per 6/20 los explained new system scheduling

## 2017-08-22 NOTE — Patient Instructions (Signed)
[facility] Discharge Instructions for Patients Receiving Chemotherapy  Today you received the following chemotherapy agents:  Rituxan.  To help prevent nausea and vomiting after your treatment, we encourage you to take your nausea medication as directed.   If you develop nausea and vomiting that is not controlled by your nausea medication, call the clinic.   BELOW ARE SYMPTOMS THAT SHOULD BE REPORTED IMMEDIATELY:  *FEVER GREATER THAN 100.5 F  *CHILLS WITH OR WITHOUT FEVER  NAUSEA AND VOMITING THAT IS NOT CONTROLLED WITH YOUR NAUSEA MEDICATION  *UNUSUAL SHORTNESS OF BREATH  *UNUSUAL BRUISING OR BLEEDING  TENDERNESS IN MOUTH AND THROAT WITH OR WITHOUT PRESENCE OF ULCERS  *URINARY PROBLEMS  *BOWEL PROBLEMS  UNUSUAL RASH Items with * indicate a potential emergency and should be followed up as soon as possible.  Feel free to call the clinic should you have any questions or concerns. The clinic phone number is [phone].  Please show the CHEMO ALERT CARD at check-in to the Emergency Department and triage nurse.   

## 2017-08-22 NOTE — Progress Notes (Signed)
[place] [phone]   [phone]  OFFICE PROGRESS NOTE  DIAGNOSIS: Bulky stage IV low-grade lymphoma diagnosed in November 2009.  PRIOR THERAPY: 1. Status post 7 cycles of systemic chemotherapy with CHOP/Rituxan. Last dose was given May 2010. 2. Status post 6 cycles of maintenance Rituxan 375 mg/sq m given every 2 months. Last dose was given on May 29, 2010, and the patient was lost to followup at that time. He received another dose on 12/11/2010, then again missed 2 doses. 3. systemic chemotherapy with Rituxan 375 mg/M2 on day 1 and that bendamustine 90 mg/M2 on days 1 and 2 every 4 weeks, status post 6 cycles. 4. Maintenance Rituxan 375 mg/M2 every 2 months, status post 12 cycles 5.  CURRENT THERAPY: Rituxan 375 MG/M2 given weekly for 4 weeks and this is followed by maintenance treatment every 2 months, status post 19 cycles.  INTERVAL HISTORY: Eric [name] 77 y.o. male returns to the clinic today for 2 months follow-up visit.  The patient is feeling fine today with no specific complaints.  He denied having any recent weight loss or night sweats.  He has no nausea, vomiting, diarrhea or constipation.  He denied having any palpable lymphadenopathy.  He denied having any chest pain, shortness of breath, cough or hemoptysis.  He continues to tolerate his treatment with maintenance Rituxan fairly well.  He had repeat CT scan of the chest, abdomen and pelvis performed recently and he is here for evaluation and discussion of his discuss results.   MEDICAL HISTORY: Past Medical History:  Diagnosis Date  . Cancer ([place])   . Coronary atherosclerosis of native coronary artery   . Encounter for antineoplastic chemotherapy 06/20/2015  . GERD (gastroesophageal reflux disease)   . History of heart attack   . Hyperlipidemia   . Myocardial infarction ([place])   . Non Hodgkin's lymphoma ([place])     ALLERGIES:  is allergic to neosporin [neomycin-bacitracin  zn-polymyx].  MEDICATIONS:  Current Outpatient Medications  Medication Sig Dispense Refill  . aspirin EC 81 MG tablet Take 81 mg by mouth at bedtime.    Marland Kitchen atorvastatin (LIPITOR) 40 MG tablet TAKE 1 TABLET (40 MG TOTAL) BY MOUTH DAILY. 30 tablet 1  . diphenhydrAMINE (BENADRYL) 25 MG tablet Take 25 mg by mouth at bedtime as needed for sleep.     Marland Kitchen omeprazole (PRILOSEC) 20 MG capsule Take 20 mg by mouth daily as needed (acid relfux).    . sildenafil (VIAGRA) 25 MG tablet Take 1 tablet (25 mg total) by mouth daily as needed. Take 2-3 tablets by mouth once daily as needed. 90 tablet 6   No current facility-administered medications for this visit.     SURGICAL HISTORY:  Past Surgical History:  Procedure Laterality Date  . CORONARY STENT PLACEMENT    . LEFT HEART CATHETERIZATION WITH CORONARY ANGIOGRAM N/A 03/03/2012   Procedure: LEFT HEART CATHETERIZATION WITH CORONARY ANGIOGRAM;  Surgeon: [name], MD;  Location: [facility] CATH LAB;  Service: Cardiovascular;  Laterality: N/A;  . PERCUTANEOUS CORONARY STENT INTERVENTION (PCI-S) N/A 03/04/2012   Procedure: PERCUTANEOUS CORONARY STENT INTERVENTION (PCI-S);  Surgeon: [name], MD;  Location: [facility] CATH LAB;  Service: Cardiovascular;  Laterality: N/A;    REVIEW OF SYSTEMS:  Constitutional: negative Eyes: negative Ears, nose, mouth, throat, and face: negative Respiratory: negative Cardiovascular: negative Gastrointestinal: negative Genitourinary:negative Integument/breast: negative Hematologic/lymphatic: negative Musculoskeletal:negative Neurological: negative Behavioral/Psych: negative Endocrine: negative Allergic/Immunologic: negative   PHYSICAL EXAMINATION: General appearance: alert, cooperative  and no distress Head: Normocephalic, without obvious abnormality, atraumatic Neck: no adenopathy, no JVD, supple, symmetrical, trachea midline and thyroid not enlarged, symmetric, no tenderness/mass/nodules Lymph nodes: Cervical,  supraclavicular, and axillary nodes normal. Resp: clear to auscultation bilaterally Back: symmetric, no curvature. ROM normal. No CVA tenderness. Cardio: regular rate and rhythm, S1, S2 normal, no murmur, click, rub or gallop GI: soft, non-tender; bowel sounds normal; no masses,  no organomegaly Extremities: extremities normal, atraumatic, no cyanosis or edema Neurologic: Alert and oriented X 3, normal strength and tone. Normal symmetric reflexes. Normal coordination and gait  ECOG PERFORMANCE STATUS: 0 - Asymptomatic  Blood pressure 140/82, pulse 70, temperature 98 F (36.7 C), temperature source Oral, resp. rate 18, height 5\' 7"  (1.702 m), weight 160 lb (72.6 kg), SpO2 98 %.  LABORATORY DATA: Lab Results  Component Value Date   WBC 4.2 08/22/2017   HGB 13.3 08/22/2017   HCT 40.9 08/22/2017   MCV 87.4 08/22/2017   PLT 211 08/22/2017      Chemistry      Component Value Date/Time   NA 140 08/16/2017 0832   NA 140 02/14/2017 0804   K 4.2 08/16/2017 0832   K 4.4 02/14/2017 0804   CL 106 08/16/2017 0832   CL 106 08/06/2012 1244   CO2 25 08/16/2017 0832   CO2 22 02/14/2017 0804   BUN 19 08/16/2017 0832   BUN 14.8 02/14/2017 0804   CREATININE 1.01 08/16/2017 0832   CREATININE 1.1 02/14/2017 0804      Component Value Date/Time   CALCIUM 9.1 08/16/2017 0832   CALCIUM 9.2 02/14/2017 0804   ALKPHOS 121 08/16/2017 0832   ALKPHOS 138 02/14/2017 0804   AST 27 08/16/2017 0832   AST 29 02/14/2017 0804   ALT 21 08/16/2017 0832   ALT 27 02/14/2017 0804   BILITOT 0.4 08/16/2017 0832   BILITOT 0.61 02/14/2017 0804       RADIOGRAPHIC STUDIES: Ct Chest W Contrast  Result Date: 08/16/2017 CLINICAL DATA:  Low-grade lymphoma, diagnosed November 2009, status post chemotherapy EXAM: CT CHEST, ABDOMEN, AND PELVIS WITH CONTRAST TECHNIQUE: Multidetector CT imaging of the chest, abdomen and pelvis was performed following the standard protocol during bolus administration of intravenous  contrast. CONTRAST:  111mL ISOVUE-300 IOPAMIDOL (ISOVUE-300) INJECTION 61% COMPARISON:  02/21/2017 FINDINGS: CT CHEST FINDINGS Cardiovascular: Heart is normal in size.  No pericardial effusion. No evidence of thoracic aortic aneurysm. Mild atherosclerotic calcifications of the aortic arch. Three vessel coronary atherosclerosis. Mediastinum/Nodes: No suspicious mediastinal, hilar, or axillary lymphadenopathy. Visualized thyroid is unremarkable. Lungs/Pleura: No suspicious pulmonary nodules. Mild dependent atelectasis in the bilateral lower lobes. No focal consolidation. No pleural effusion or pneumothorax. Musculoskeletal: Mild degenerative changes of the lower thoracic spine. CT ABDOMEN PELVIS FINDINGS Hepatobiliary: Liver is within normal limits. Gallbladder is unremarkable. No intrahepatic or extrahepatic ductal dilatation. Pancreas: Within normal limits. Spleen: Spleen is normal in size. Adrenals/Urinary Tract: Adrenal glands are within normal limits. Bilateral renal cysts, measuring up to 5.3 cm in the lateral right lower kidney (series 2/image 67). No hydronephrosis. Bladder is mildly thick-walled although underdistended, likely reflecting chronic bladder outlet obstruction. Stomach/Bowel: Stomach is notable for a moderate hiatal hernia. No evidence of bowel obstruction. Normal appendix (series 2/image 90). Mild left colonic diverticulosis, without evidence of diverticulitis. Vascular/Lymphatic: No evidence of abdominal aortic aneurysm. Atherosclerotic calcifications of the abdominal aorta and branch vessels. No suspicious abdominopelvic lymphadenopathy. 9 mm short axis aortocaval node (series 2/image 55), previously 8 mm. Reproductive: Marked prostatomegaly, with enlargement of the central  gland. Prostate measures approximately 6.9 x 8.7 cm. Other: No abdominopelvic ascites. Tiny fat containing periumbilical hernia. Musculoskeletal: Visualized osseous structures are within normal limits. IMPRESSION: No  findings specific for recurrent or metastatic disease. 9 mm short axis aortocaval node, similar versus minimally increased. Attention on follow-up is suggested. Spleen is normal in size. Additional stable ancillary findings as above. Electronically Signed   By: Julian Hy M.D.   On: 08/16/2017 11:08   Ct Abdomen Pelvis W Contrast  Result Date: 08/16/2017 CLINICAL DATA:  Low-grade lymphoma, diagnosed November 2009, status post chemotherapy EXAM: CT CHEST, ABDOMEN, AND PELVIS WITH CONTRAST TECHNIQUE: Multidetector CT imaging of the chest, abdomen and pelvis was performed following the standard protocol during bolus administration of intravenous contrast. CONTRAST:  153mL ISOVUE-300 IOPAMIDOL (ISOVUE-300) INJECTION 61% COMPARISON:  02/21/2017 FINDINGS: CT CHEST FINDINGS Cardiovascular: Heart is normal in size.  No pericardial effusion. No evidence of thoracic aortic aneurysm. Mild atherosclerotic calcifications of the aortic arch. Three vessel coronary atherosclerosis. Mediastinum/Nodes: No suspicious mediastinal, hilar, or axillary lymphadenopathy. Visualized thyroid is unremarkable. Lungs/Pleura: No suspicious pulmonary nodules. Mild dependent atelectasis in the bilateral lower lobes. No focal consolidation. No pleural effusion or pneumothorax. Musculoskeletal: Mild degenerative changes of the lower thoracic spine. CT ABDOMEN PELVIS FINDINGS Hepatobiliary: Liver is within normal limits. Gallbladder is unremarkable. No intrahepatic or extrahepatic ductal dilatation. Pancreas: Within normal limits. Spleen: Spleen is normal in size. Adrenals/Urinary Tract: Adrenal glands are within normal limits. Bilateral renal cysts, measuring up to 5.3 cm in the lateral right lower kidney (series 2/image 67). No hydronephrosis. Bladder is mildly thick-walled although underdistended, likely reflecting chronic bladder outlet obstruction. Stomach/Bowel: Stomach is notable for a moderate hiatal hernia. No evidence of bowel  obstruction. Normal appendix (series 2/image 90). Mild left colonic diverticulosis, without evidence of diverticulitis. Vascular/Lymphatic: No evidence of abdominal aortic aneurysm. Atherosclerotic calcifications of the abdominal aorta and branch vessels. No suspicious abdominopelvic lymphadenopathy. 9 mm short axis aortocaval node (series 2/image 55), previously 8 mm. Reproductive: Marked prostatomegaly, with enlargement of the central gland. Prostate measures approximately 6.9 x 8.7 cm. Other: No abdominopelvic ascites. Tiny fat containing periumbilical hernia. Musculoskeletal: Visualized osseous structures are within normal limits. IMPRESSION: No findings specific for recurrent or metastatic disease. 9 mm short axis aortocaval node, similar versus minimally increased. Attention on follow-up is suggested. Spleen is normal in size. Additional stable ancillary findings as above. Electronically Signed   By: Julian Hy M.D.   On: 08/16/2017 11:08    ASSESSMENT AND PLAN:  This is a very pleasant 77 years old African-American male with bulky stage IV follicular lymphoma initially treated with CHOP/Rituxan followed by maintenance Rituxan. The patient had disease recurrence and he was restarted again on treatment with Rituxan every 2 months status post 19 cycles. The patient continues to tolerate his treatment with maintenance Rituxan fairly well with no concerning complaints. Repeat CT scan of the chest, abdomen and pelvis performed recently showed no concerning findings for disease progression.  I personally and independently reviewed the scans and discussed the results with the patient today. I recommended for him to continue his current treatment with maintenance Rituxan every 2 months. The patient was advised to call immediately if he has any concerning symptoms in the interval. The patient voices understanding of current disease status and treatment options and is in agreement with the current care  plan. All questions were answered. The patient knows to call the clinic with any problems, questions or concerns. We can certainly see the patient much sooner  if necessary. I spent 15 minutes counseling the patient face to face. The total time spent in the appointment was 25 minutes.  Disclaimer: This note was dictated with voice recognition software. Similar sounding words can inadvertently be transcribed and may not be corrected upon review.

## 2017-08-23 ENCOUNTER — Other Ambulatory Visit: Payer: Self-pay | Admitting: Family Medicine

## 2017-08-27 NOTE — Progress Notes (Deleted)
No-show/late show requiring rescheduling.

## 2017-08-28 ENCOUNTER — Ambulatory Visit: Payer: Medicare Other | Admitting: Interventional Cardiology

## 2017-08-28 ENCOUNTER — Other Ambulatory Visit: Payer: Self-pay

## 2017-08-28 MED ORDER — ATORVASTATIN CALCIUM 40 MG PO TABS: 40.0000 mg | ORAL_TABLET | Freq: Every day | ORAL | 0 refills | Status: DC

## 2017-09-19 ENCOUNTER — Telehealth: Payer: Self-pay

## 2017-09-19 NOTE — Telephone Encounter (Signed)
Spoke with patient concerning his upcoming appointments being adjusted. Per 7/18 corrections

## 2017-09-27 DIAGNOSIS — R972 Elevated prostate specific antigen [PSA]: Secondary | ICD-10-CM | POA: Diagnosis not present

## 2017-09-27 DIAGNOSIS — N401 Enlarged prostate with lower urinary tract symptoms: Secondary | ICD-10-CM | POA: Diagnosis not present

## 2017-09-27 DIAGNOSIS — N5201 Erectile dysfunction due to arterial insufficiency: Secondary | ICD-10-CM | POA: Diagnosis not present

## 2017-09-27 DIAGNOSIS — N138 Other obstructive and reflux uropathy: Secondary | ICD-10-CM | POA: Diagnosis not present

## 2017-10-16 ENCOUNTER — Other Ambulatory Visit: Payer: Self-pay | Admitting: *Deleted

## 2017-10-16 DIAGNOSIS — C8208 Follicular lymphoma grade I, lymph nodes of multiple sites: Secondary | ICD-10-CM

## 2017-10-17 ENCOUNTER — Other Ambulatory Visit: Payer: Medicare Other

## 2017-10-17 ENCOUNTER — Inpatient Hospital Stay: Payer: Medicare Other | Attending: Internal Medicine | Admitting: Internal Medicine

## 2017-10-17 ENCOUNTER — Telehealth: Payer: Self-pay

## 2017-10-17 ENCOUNTER — Inpatient Hospital Stay: Payer: Medicare Other

## 2017-10-17 ENCOUNTER — Encounter: Payer: Self-pay | Admitting: Internal Medicine

## 2017-10-17 VITALS — BP 126/83 | HR 67 | Temp 97.9°F | Resp 18 | Ht 67.0 in | Wt 156.0 lb

## 2017-10-17 VITALS — BP 110/72 | HR 62 | Temp 97.8°F

## 2017-10-17 DIAGNOSIS — Z5111 Encounter for antineoplastic chemotherapy: Secondary | ICD-10-CM

## 2017-10-17 DIAGNOSIS — C8208 Follicular lymphoma grade I, lymph nodes of multiple sites: Secondary | ICD-10-CM | POA: Insufficient documentation

## 2017-10-17 DIAGNOSIS — Z7982 Long term (current) use of aspirin: Secondary | ICD-10-CM | POA: Insufficient documentation

## 2017-10-17 DIAGNOSIS — Z5112 Encounter for antineoplastic immunotherapy: Secondary | ICD-10-CM | POA: Diagnosis not present

## 2017-10-17 DIAGNOSIS — Z9221 Personal history of antineoplastic chemotherapy: Secondary | ICD-10-CM | POA: Insufficient documentation

## 2017-10-17 DIAGNOSIS — Z79899 Other long term (current) drug therapy: Secondary | ICD-10-CM | POA: Insufficient documentation

## 2017-10-17 DIAGNOSIS — I252 Old myocardial infarction: Secondary | ICD-10-CM | POA: Diagnosis not present

## 2017-10-17 LAB — CBC WITH DIFFERENTIAL (CANCER CENTER ONLY)
Basophils Absolute: 0 10*3/uL (ref 0.0–0.1)
Basophils Relative: 1 %
EOS ABS: 0.2 10*3/uL (ref 0.0–0.5)
Eosinophils Relative: 6 %
HEMATOCRIT: 40.4 % (ref 38.4–49.9)
HEMOGLOBIN: 13.7 g/dL (ref 13.0–17.1)
LYMPHS ABS: 1.4 10*3/uL (ref 0.9–3.3)
Lymphocytes Relative: 38 %
MCH: 29.3 pg (ref 27.2–33.4)
MCHC: 33.9 g/dL (ref 32.0–36.0)
MCV: 86.3 fL (ref 79.3–98.0)
MONO ABS: 0.7 10*3/uL (ref 0.1–0.9)
MONOS PCT: 20 %
NEUTROS PCT: 35 %
Neutro Abs: 1.3 10*3/uL — ABNORMAL LOW (ref 1.5–6.5)
Platelet Count: 244 10*3/uL (ref 140–400)
RBC: 4.68 MIL/uL (ref 4.20–5.82)
RDW: 14.8 % — ABNORMAL HIGH (ref 11.0–14.6)
WBC Count: 3.7 10*3/uL — ABNORMAL LOW (ref 4.0–10.3)

## 2017-10-17 LAB — CMP (CANCER CENTER ONLY)
ALBUMIN: 3.6 g/dL (ref 3.5–5.0)
ALT: 15 U/L (ref 0–44)
AST: 18 U/L (ref 15–41)
Alkaline Phosphatase: 113 U/L (ref 38–126)
Anion gap: 7 (ref 5–15)
BUN: 18 mg/dL (ref 8–23)
CHLORIDE: 107 mmol/L (ref 98–111)
CO2: 27 mmol/L (ref 22–32)
CREATININE: 1.23 mg/dL (ref 0.61–1.24)
Calcium: 8.6 mg/dL — ABNORMAL LOW (ref 8.9–10.3)
GFR, Estimated: 55 mL/min — ABNORMAL LOW (ref >60)
GLUCOSE: 81 mg/dL (ref 70–99)
Potassium: 4.7 mmol/L (ref 3.5–5.1)
SODIUM: 141 mmol/L (ref 135–145)
Total Bilirubin: 0.5 mg/dL (ref 0.3–1.2)
Total Protein: 6.3 g/dL — ABNORMAL LOW (ref 6.5–8.1)

## 2017-10-17 MED ORDER — SODIUM CHLORIDE 0.9 % IV SOLN
375.0000 mg/m2 | Freq: Once | INTRAVENOUS | Status: AC
  Administered 2017-10-17: 700 mg via INTRAVENOUS
  Filled 2017-10-17: qty 50

## 2017-10-17 MED ORDER — SODIUM CHLORIDE 0.9 % IV SOLN
Freq: Once | INTRAVENOUS | Status: AC
  Administered 2017-10-17: 13:00:00 via INTRAVENOUS
  Filled 2017-10-17: qty 250

## 2017-10-17 MED ORDER — DIPHENHYDRAMINE HCL 25 MG PO CAPS
50.0000 mg | ORAL_CAPSULE | Freq: Once | ORAL | Status: AC
  Administered 2017-10-17: 50 mg via ORAL

## 2017-10-17 MED ORDER — ACETAMINOPHEN 325 MG PO TABS
ORAL_TABLET | ORAL | Status: AC
  Filled 2017-10-17: qty 2

## 2017-10-17 MED ORDER — ACETAMINOPHEN 325 MG PO TABS
650.0000 mg | ORAL_TABLET | Freq: Once | ORAL | Status: AC
  Administered 2017-10-17: 650 mg via ORAL

## 2017-10-17 MED ORDER — DIPHENHYDRAMINE HCL 25 MG PO CAPS
ORAL_CAPSULE | ORAL | Status: AC
  Filled 2017-10-17: qty 2

## 2017-10-17 NOTE — Telephone Encounter (Signed)
Printed avs and calender of upcoming appointment. Per 8/15 los

## 2017-10-17 NOTE — Patient Instructions (Signed)
[facility] Discharge Instructions for Patients Receiving Chemotherapy  Today you received the following chemotherapy agents:  Rituxan.  To help prevent nausea and vomiting after your treatment, we encourage you to take your nausea medication as directed.   If you develop nausea and vomiting that is not controlled by your nausea medication, call the clinic.   BELOW ARE SYMPTOMS THAT SHOULD BE REPORTED IMMEDIATELY:  *FEVER GREATER THAN 100.5 F  *CHILLS WITH OR WITHOUT FEVER  NAUSEA AND VOMITING THAT IS NOT CONTROLLED WITH YOUR NAUSEA MEDICATION  *UNUSUAL SHORTNESS OF BREATH  *UNUSUAL BRUISING OR BLEEDING  TENDERNESS IN MOUTH AND THROAT WITH OR WITHOUT PRESENCE OF ULCERS  *URINARY PROBLEMS  *BOWEL PROBLEMS  UNUSUAL RASH Items with * indicate a potential emergency and should be followed up as soon as possible.  Feel free to call the clinic should you have any questions or concerns. The clinic phone number is [phone].  Please show the CHEMO ALERT CARD at check-in to the Emergency Department and triage nurse.   

## 2017-10-17 NOTE — Progress Notes (Signed)
[place] [phone]   [phone]  OFFICE PROGRESS NOTE  DIAGNOSIS: Bulky stage IV low-grade lymphoma diagnosed in November 2009.  PRIOR THERAPY: 1. Status post 7 cycles of systemic chemotherapy with CHOP/Rituxan. Last dose was given May 2010. 2. Status post 6 cycles of maintenance Rituxan 375 mg/sq m given every 2 months. Last dose was given on May 29, 2010, and the patient was lost to followup at that time. He received another dose on 12/11/2010, then again missed 2 doses. 3. systemic chemotherapy with Rituxan 375 mg/M2 on day 1 and that bendamustine 90 mg/M2 on days 1 and 2 every 4 weeks, status post 6 cycles. 4. Maintenance Rituxan 375 mg/M2 every 2 months, status post 12 cycles 5.  CURRENT THERAPY: Rituxan 375 MG/M2 given weekly for 4 weeks and this is followed by maintenance treatment every 2 months, status post 20 cycles.  INTERVAL HISTORY: Eric [name] 77 y.o. male returns to the clinic today for follow-up visit.  The patient is feeling well today with no concerning complaints.  He continues to tolerate his maintenance treatment with Rituxan fairly well.  He denied having any recent chest pain, shortness breath, cough or hemoptysis.  He denied having any palpable lymphadenopathy.  He has no recent weight loss or night sweats.  He has no nausea, vomiting, diarrhea or constipation.  He is here today for evaluation before starting cycle #21 of his treatment.   MEDICAL HISTORY: Past Medical History:  Diagnosis Date  . Cancer ([place])   . Coronary atherosclerosis of native coronary artery   . Encounter for antineoplastic chemotherapy 06/20/2015  . GERD (gastroesophageal reflux disease)   . History of heart attack   . Hyperlipidemia   . Myocardial infarction ([place])   . Non Hodgkin's lymphoma ([place])     ALLERGIES:  is allergic to neosporin [neomycin-bacitracin zn-polymyx].  MEDICATIONS:  Current Outpatient Medications  Medication Sig  Dispense Refill  . aspirin EC 81 MG tablet Take 81 mg by mouth at bedtime.    Marland Kitchen atorvastatin (LIPITOR) 40 MG tablet Take 1 tablet (40 mg total) by mouth daily. Please keep upcoming appointment for additional refills thanks. 90 tablet 0  . diphenhydrAMINE (BENADRYL) 25 MG tablet Take 25 mg by mouth at bedtime as needed for sleep.     Marland Kitchen omeprazole (PRILOSEC) 20 MG capsule Take 20 mg by mouth daily as needed (acid relfux).    . sildenafil (VIAGRA) 25 MG tablet Take 1 tablet (25 mg total) by mouth daily as needed. Take 2-3 tablets by mouth once daily as needed. 90 tablet 6   No current facility-administered medications for this visit.     SURGICAL HISTORY:  Past Surgical History:  Procedure Laterality Date  . CORONARY STENT PLACEMENT    . LEFT HEART CATHETERIZATION WITH CORONARY ANGIOGRAM N/A 03/03/2012   Procedure: LEFT HEART CATHETERIZATION WITH CORONARY ANGIOGRAM;  Surgeon: [name], MD;  Location: [facility] CATH LAB;  Service: Cardiovascular;  Laterality: N/A;  . PERCUTANEOUS CORONARY STENT INTERVENTION (PCI-S) N/A 03/04/2012   Procedure: PERCUTANEOUS CORONARY STENT INTERVENTION (PCI-S);  Surgeon: [name], MD;  Location: [facility] CATH LAB;  Service: Cardiovascular;  Laterality: N/A;    REVIEW OF SYSTEMS:  A comprehensive review of systems was negative.   PHYSICAL EXAMINATION: General appearance: alert, cooperative and no distress Head: Normocephalic, without obvious abnormality, atraumatic Neck: no adenopathy, no JVD, supple, symmetrical, trachea midline and thyroid not enlarged, symmetric, no tenderness/mass/nodules Lymph nodes: Cervical, supraclavicular, and axillary  nodes normal. Resp: clear to auscultation bilaterally Back: symmetric, no curvature. ROM normal. No CVA tenderness. Cardio: regular rate and rhythm, S1, S2 normal, no murmur, click, rub or gallop GI: soft, non-tender; bowel sounds normal; no masses,  no organomegaly Extremities: extremities normal, atraumatic, no cyanosis  or edema  ECOG PERFORMANCE STATUS: 0 - Asymptomatic  Blood pressure 126/83, pulse 67, temperature 97.9 F (36.6 C), temperature source Oral, resp. rate 18, height 5\' 7"  (1.702 m), weight 156 lb (70.8 kg), SpO2 99 %.  LABORATORY DATA: Lab Results  Component Value Date   WBC 3.7 (L) 10/17/2017   HGB 13.7 10/17/2017   HCT 40.4 10/17/2017   MCV 86.3 10/17/2017   PLT 244 10/17/2017      Chemistry      Component Value Date/Time   NA 141 10/17/2017 1128   NA 140 02/14/2017 0804   K 4.7 10/17/2017 1128   K 4.4 02/14/2017 0804   CL 107 10/17/2017 1128   CL 106 08/06/2012 1244   CO2 27 10/17/2017 1128   CO2 22 02/14/2017 0804   BUN 18 10/17/2017 1128   BUN 14.8 02/14/2017 0804   CREATININE 1.23 10/17/2017 1128   CREATININE 1.1 02/14/2017 0804      Component Value Date/Time   CALCIUM 8.6 (L) 10/17/2017 1128   CALCIUM 9.2 02/14/2017 0804   ALKPHOS 113 10/17/2017 1128   ALKPHOS 138 02/14/2017 0804   AST 18 10/17/2017 1128   AST 29 02/14/2017 0804   ALT 15 10/17/2017 1128   ALT 27 02/14/2017 0804   BILITOT 0.5 10/17/2017 1128   BILITOT 0.61 02/14/2017 0804       RADIOGRAPHIC STUDIES: No results found.  ASSESSMENT AND PLAN:  This is a very pleasant 77 years old African-American male with bulky stage IV follicular lymphoma initially treated with CHOP/Rituxan followed by maintenance Rituxan. The patient had disease recurrence and he was restarted again on treatment with Rituxan every 2 months status post 20 cycles. He continues to tolerate his treatment well with no concerning complaints. I recommended for the patient to proceed with cycle #21 today as scheduled. I will see him back for follow-up visit in 2 months for evaluation before starting cycle #22. He was advised to call immediately if he has any concerning symptoms in the interval. The patient voices understanding of current disease status and treatment options and is in agreement with the current care plan. All  questions were answered. The patient knows to call the clinic with any problems, questions or concerns. We can certainly see the patient much sooner if necessary. I spent 15 minutes counseling the patient face to face. The total time spent in the appointment was 25 minutes.  Disclaimer: This note was dictated with voice recognition software. Similar sounding words can inadvertently be transcribed and may not be corrected upon review.

## 2017-11-27 ENCOUNTER — Other Ambulatory Visit: Payer: Self-pay | Admitting: Interventional Cardiology

## 2017-12-08 NOTE — Progress Notes (Signed)
Cardiology Office Note:    Date:  12/09/2017   ID:  Eric [name], DOB [birth year], MRN [id number]  PCP:  Patient, Eric [name]  Cardiologist:  Eric primary care provider on file.   Referring MD: Eric Honour, MD   Chief Complaint  Patient presents with  . Coronary Artery Disease    History of Present Illness:    Eric [name] is a 77 y.o. male with a hx of follicular lymphoma, CAD with RCA DES 2013, prior MI, and hyperlipidemia.   Overall, he is doing well.  He denies angina.  He is eating right.  He exercises but does not know total minutes [name] week.  Sounds in excess of 150 minutes.  He has gained little weight.  Overall he feels well.    Past Medical History:  Diagnosis Date  . Cancer ([place])   . Coronary atherosclerosis of native coronary artery   . Encounter for antineoplastic chemotherapy 06/20/2015  . GERD (gastroesophageal reflux disease)   . History of heart attack   . Hyperlipidemia   . Myocardial infarction ([place])   . Non Hodgkin's lymphoma [facility])     Past Surgical History:  Procedure Laterality Date  . CORONARY STENT PLACEMENT    . LEFT HEART CATHETERIZATION WITH CORONARY ANGIOGRAM N/A 03/03/2012   Procedure: LEFT HEART CATHETERIZATION WITH CORONARY ANGIOGRAM;  Surgeon: Eric Furbish, MD;  Location: [facility] C[facility] LAB;  Service: Cardiovascular;  Laterality: N/A;  . PERCUTANEOUS CORONARY STENT INTERVENTION (PCI-S) N/A 03/04/2012   Procedure: PERCUTANEOUS CORONARY STENT INTERVENTION (PCI-S);  Surgeon: Eric Grooms, MD;  Location: [facility] CATH LAB;  Service: Cardiovascular;  Laterality: N/A;    Current Medications: Current Meds  Medication Sig  . aspirin EC 81 MG tablet Take 81 mg by mouth at bedtime.  Marland Kitchen atorvastatin (LIPITOR) 40 MG tablet Take 1 tablet (40 mg total) by mouth daily at 6 PM.  . diphenhydrAMINE (BENADRYL) 25 MG tablet Take 25 mg by mouth at bedtime as needed for sleep.   Marland Kitchen omeprazole (PRILOSEC) 20 MG capsule Take 20 mg by mouth daily as needed (acid  relfux).  . sildenafil (VIAGRA) 25 MG tablet Take 1 tablet (25 mg total) by mouth daily as needed. Take 2-3 tablets by mouth once daily as needed.  . [DISCONTINUED] atorvastatin (LIPITOR) 40 MG tablet Take 1 tablet (40 mg total) by mouth daily at 6 PM. Please keep upcoming appt with Eric [name] in October before anymore refills. Thank you     Allergies:   Neosporin [neomycin-bacitracin zn-polymyx]   Social History   Socioeconomic History  . Marital status: Married    Spouse name: Not on file  . Number of children: Not on file  . Years of education: Not on file  . Highest education level: Not on file  Occupational History  . Occupation: Software engineer  Social Needs  . Financial resource strain: Not on file  . Food insecurity:    Worry: Not on file    Inability: Not on file  . Transportation needs:    Medical: Not on file    Non-medical: Not on file  Tobacco Use  . Smoking status: Never Smoker  . Smokeless tobacco: Never Used  Substance and Sexual Activity  . Alcohol use: Yes    Comment: occasionally  . Drug use: Eric  . Sexual activity: Yes  Lifestyle  . Physical activity:    Days [name] week: Not on file    Minutes [name] session: Not on file  . Stress: Not on  file  Relationships  . Social connections:    Talks on phone: Not on file    Gets together: Not on file    Attends religious service: Not on file    Active member of club or organization: Not on file    Attends meetings of clubs or organizations: Not on file    Relationship status: Not on file  Other Topics Concern  . 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       Tobacco: never      Alcohol:  Socially; almost none in 2017      Exercise  in door track, cardio machine and treadmill 1-2 times [name] week      ADLs: independent with ADLs;       Advanced Directives: Eric; desires FULL CODE.      Family History: The patient's family history includes Leukemia in his mother.  ROS:   Please see the history of present illness.    Lymphoma is still active but quiesced sent relative to requirement for with therapy.  He sees Eric [name].  All other systems reviewed and are negative.  EKGs/Labs/Other Studies Reviewed:    The following studies were reviewed today: Eric new data.  Eric recent lipid panel.  EKG:  EKG is ordered today.  The ekg ordered today demonstrates normal sinus rhythm with normal overall appearance and unchanged when compared to prior tracing from 05/21/2016.  Recent Labs: 10/17/2017: ALT 15; BUN 18; Creatinine 1.23; Hemoglobin 13.7; Platelet Count 244; Potassium 4.7; Sodium 141  Recent Lipid Panel    Component Value Date/Time   CHOL 187 02/29/2016 1613   TRIG 84 02/29/2016 1613   HDL 45 02/29/2016 1613   CHOLHDL 4.2 02/29/2016 1613   CHOLHDL 3.0 01/05/2015 1440   VLDL 15 01/05/2015 1440   LDLCALC 125 (H) 02/29/2016 1613    Physical Exam:    VS:  BP 132/80   Pulse 66   Ht 5\' 7"  (1.702 m)   Wt 157 lb 12.8 oz (71.6 kg)   BMI 24.71 kg/m     Wt Readings from Last 3 Encounters:  12/09/17 157 lb 12.8 oz (71.6 kg)  10/17/17 156 lb (70.8 kg)  08/22/17 160 lb (72.6 kg)     GEN:  Well nourished, well developed in Eric acute distress HEENT: Normal NECK: Eric JVD. LYMPHATICS: Eric lymphadenopathy CARDIAC: RRR, Eric murmur, Eric gallop, Eric edema. VASCULAR: 2+ pulses.  Eric bruits. RESPIRATORY:  Clear to auscultation without rales, wheezing or rhonchi  ABDOMEN: Soft, non-tender, non-distended, Eric pulsatile mass, MUSCULOSKELETAL: Eric deformity  SKIN: Warm and dry NEUROLOGIC:  Alert and oriented x 3 PSYCHIATRIC:  Normal affect   ASSESSMENT:    1. Mixed hyperlipidemia   2. CAD in native artery    PLAN:     In order of problems listed above:  1. Lipid and liver panel today.  (Actually a comprehensive metabolic panel will be obtained).  We discussed the less than 70 LDL metric that needs to be achieved.  Last data suggests that he was significantly above this.  Likely switch to rosuvastatin 40 mg/day and may need to consider PCSK9 therapy. 2. Stable without angina.  Maintain an active lifestyle.  Blood pressure is under good control.  Eric therapy for blood pressure is required.  Does not smoke.  Watching diet.  Glycemic control has been great.  Blood sugar will be obtained today and the comprehensive metabolic panel.  Encouraged aerobic activity, and reduction of LDL cholesterol.  He understands the goals.  We will discuss further after additional information is available.   Medication Adjustments/Labs and Tests Ordered: Current medicines are reviewed at length with the patient today.  Concerns regarding medicines are outlined above.  Orders Placed This Encounter  Procedures  . EKG 12-Lead   Meds ordered this encounter  Medications  . atorvastatin (LIPITOR) 40 MG tablet    Sig: Take 1 tablet (40 mg total) by mouth daily at 6 PM.    Dispense:  90 tablet    Refill:  3    There are Eric Patient Instructions on file for this visit.   Signed, Eric Grooms, MD  12/09/2017 8:54 AM    [place]

## 2017-12-09 ENCOUNTER — Ambulatory Visit (INDEPENDENT_AMBULATORY_CARE_PROVIDER_SITE_OTHER): Payer: Medicare Other | Admitting: Interventional Cardiology

## 2017-12-09 ENCOUNTER — Encounter: Payer: Self-pay | Admitting: Interventional Cardiology

## 2017-12-09 VITALS — BP 132/80 | HR 66 | Ht 67.0 in | Wt 157.8 lb

## 2017-12-09 DIAGNOSIS — I2510 Atherosclerotic heart disease of native coronary artery without angina pectoris: Secondary | ICD-10-CM

## 2017-12-09 DIAGNOSIS — E782 Mixed hyperlipidemia: Secondary | ICD-10-CM

## 2017-12-09 LAB — COMPREHENSIVE METABOLIC PANEL
ALBUMIN: 4.3 g/dL (ref 3.5–4.8)
ALK PHOS: 123 IU/L — AB (ref 39–117)
ALT: 25 IU/L (ref 0–44)
AST: 23 IU/L (ref 0–40)
Albumin/Globulin Ratio: 2.4 — ABNORMAL HIGH (ref 1.2–2.2)
BUN / CREAT RATIO: 13 (ref 10–24)
BUN: 14 mg/dL (ref 8–27)
Bilirubin Total: 0.3 mg/dL (ref 0.0–1.2)
CALCIUM: 9.6 mg/dL (ref 8.6–10.2)
CO2: 24 mmol/L (ref 20–29)
CREATININE: 1.09 mg/dL (ref 0.76–1.27)
Chloride: 100 mmol/L (ref 96–106)
GFR calc Af Amer: 76 mL/min/{1.73_m2} (ref >59)
GFR, EST NON AFRICAN AMERICAN: 66 mL/min/{1.73_m2} (ref >59)
GLOBULIN, TOTAL: 1.8 g/dL (ref 1.5–4.5)
Glucose: 80 mg/dL (ref 65–99)
Potassium: 5.2 mmol/L (ref 3.5–5.2)
SODIUM: 140 mmol/L (ref 134–144)
TOTAL PROTEIN: 6.1 g/dL (ref 6.0–8.5)

## 2017-12-09 LAB — LIPID PANEL
CHOL/HDL RATIO: 2.3 ratio (ref 0.0–5.0)
Cholesterol, Total: 138 mg/dL (ref 100–199)
HDL: 59 mg/dL (ref >39)
LDL CALC: 70 mg/dL (ref 0–99)
Triglycerides: 44 mg/dL (ref 0–149)
VLDL CHOLESTEROL CAL: 9 mg/dL (ref 5–40)

## 2017-12-09 MED ORDER — ATORVASTATIN CALCIUM 40 MG PO TABS: 40.0000 mg | ORAL_TABLET | Freq: Every day | ORAL | 3 refills | Status: DC

## 2017-12-09 NOTE — Patient Instructions (Signed)
Medication Instructions:  Your provider recommends that you continue on your current medications as directed. Please refer to the Current Medication list given to you today.   If you need a refill on your cardiac medications before your next appointment, please call your pharmacy.   Lab work: TODAY: CMET, lipid panel If you have labs (blood work) drawn today and your tests are completely normal, you will receive your results only by: Marland Kitchen MyChart Message (if you have MyChart) OR . A paper copy in the mail If you have any lab test that is abnormal or we need to change your treatment, we will call you to review the results.  Testing/Procedures: None  Follow-Up: At [facility], you and your health needs are our priority.  As part of our continuing mission to provide you with exceptional heart care, we have created designated Provider Care Teams.  These Care Teams include your primary Cardiologist (physician) and Advanced Practice Providers (APPs -  Physician Assistants and Nurse Practitioners) who all work together to provide you with the care you need, when you need it. You will need a follow up appointment in 12 months.  Please call our office 2 months in advance to schedule this appointment. You may see Dr. Tamala Julian or one of the following Advanced Practice Providers on your designated Care Team:   Truitt Merle, NP Cecilie Kicks, NP . Kathyrn Drown, NP

## 2017-12-11 ENCOUNTER — Telehealth: Payer: Self-pay | Admitting: *Deleted

## 2017-12-11 ENCOUNTER — Encounter: Payer: Self-pay | Admitting: *Deleted

## 2017-12-11 NOTE — Telephone Encounter (Signed)
DPR ok to lmom. Lab work ok, continue on current Tx plan. If any questions please call [phone]. Per Dr. Tamala Julian to send a copy to pt. I will put this in the mail today.

## 2017-12-11 NOTE — Telephone Encounter (Signed)
-----   Message from Belva Crome, MD sent at 12/10/2017  9:38 PM EDT ----- Let the patient know lipids are excellent and at target. Liver is okay. No change in therapy needed. A copy will be sent to Patient, No Pcp Per

## 2017-12-18 ENCOUNTER — Other Ambulatory Visit: Payer: Self-pay | Admitting: Medical Oncology

## 2017-12-18 DIAGNOSIS — C8208 Follicular lymphoma grade I, lymph nodes of multiple sites: Secondary | ICD-10-CM

## 2017-12-19 ENCOUNTER — Inpatient Hospital Stay: Payer: Medicare Other

## 2017-12-19 ENCOUNTER — Inpatient Hospital Stay (HOSPITAL_BASED_OUTPATIENT_CLINIC_OR_DEPARTMENT_OTHER): Payer: Medicare Other | Admitting: Internal Medicine

## 2017-12-19 ENCOUNTER — Inpatient Hospital Stay: Payer: Medicare Other | Attending: Internal Medicine

## 2017-12-19 ENCOUNTER — Telehealth: Payer: Self-pay | Admitting: Internal Medicine

## 2017-12-19 ENCOUNTER — Encounter: Payer: Self-pay | Admitting: Internal Medicine

## 2017-12-19 VITALS — BP 132/87 | HR 69 | Temp 98.2°F | Resp 16 | Ht 67.0 in | Wt 156.6 lb

## 2017-12-19 VITALS — BP 120/65 | HR 61 | Resp 18

## 2017-12-19 DIAGNOSIS — Z9221 Personal history of antineoplastic chemotherapy: Secondary | ICD-10-CM | POA: Diagnosis not present

## 2017-12-19 DIAGNOSIS — I252 Old myocardial infarction: Secondary | ICD-10-CM | POA: Diagnosis not present

## 2017-12-19 DIAGNOSIS — K219 Gastro-esophageal reflux disease without esophagitis: Secondary | ICD-10-CM

## 2017-12-19 DIAGNOSIS — C8208 Follicular lymphoma grade I, lymph nodes of multiple sites: Secondary | ICD-10-CM | POA: Diagnosis not present

## 2017-12-19 DIAGNOSIS — Z5112 Encounter for antineoplastic immunotherapy: Secondary | ICD-10-CM | POA: Insufficient documentation

## 2017-12-19 DIAGNOSIS — Z79899 Other long term (current) drug therapy: Secondary | ICD-10-CM

## 2017-12-19 DIAGNOSIS — Z7982 Long term (current) use of aspirin: Secondary | ICD-10-CM | POA: Diagnosis not present

## 2017-12-19 LAB — CMP (CANCER CENTER ONLY)
ALT: 24 U/L (ref 0–44)
AST: 26 U/L (ref 15–41)
Albumin: 3.9 g/dL (ref 3.5–5.0)
Alkaline Phosphatase: 126 U/L (ref 38–126)
Anion gap: 9 (ref 5–15)
BUN: 19 mg/dL (ref 8–23)
CHLORIDE: 106 mmol/L (ref 98–111)
CO2: 27 mmol/L (ref 22–32)
CREATININE: 1.07 mg/dL (ref 0.61–1.24)
Calcium: 9.6 mg/dL (ref 8.9–10.3)
GFR, Est AFR Am: >60 mL/min (ref >60)
GFR, Estimated: >60 mL/min (ref >60)
Glucose, Bld: 89 mg/dL (ref 70–99)
Potassium: 4.4 mmol/L (ref 3.5–5.1)
SODIUM: 142 mmol/L (ref 135–145)
Total Bilirubin: 0.4 mg/dL (ref 0.3–1.2)
Total Protein: 6.3 g/dL — ABNORMAL LOW (ref 6.5–8.1)

## 2017-12-19 LAB — CBC WITH DIFFERENTIAL (CANCER CENTER ONLY)
ABS IMMATURE GRANULOCYTES: 0.16 10*3/uL — AB (ref 0.00–0.07)
BASOS ABS: 0 10*3/uL (ref 0.0–0.1)
Basophils Relative: 1 %
Eosinophils Absolute: 0.4 10*3/uL (ref 0.0–0.5)
Eosinophils Relative: 11 %
HEMATOCRIT: 42.4 % (ref 39.0–52.0)
HEMOGLOBIN: 14.3 g/dL (ref 13.0–17.0)
Immature Granulocytes: 4 %
LYMPHS ABS: 1.2 10*3/uL (ref 0.7–4.0)
Lymphocytes Relative: 33 %
MCH: 29.5 pg (ref 26.0–34.0)
MCHC: 33.7 g/dL (ref 30.0–36.0)
MCV: 87.6 fL (ref 80.0–100.0)
MONO ABS: 0.6 10*3/uL (ref 0.1–1.0)
Monocytes Relative: 16 %
NEUTROS ABS: 1.3 10*3/uL — AB (ref 1.7–7.7)
NRBC: 0 % (ref 0.0–0.2)
Neutrophils Relative %: 35 %
Platelet Count: 216 10*3/uL (ref 150–400)
RBC: 4.84 MIL/uL (ref 4.22–5.81)
RDW: 15.1 % (ref 11.5–15.5)
WBC Count: 3.7 10*3/uL — ABNORMAL LOW (ref 4.0–10.5)

## 2017-12-19 MED ORDER — ACETAMINOPHEN 325 MG PO TABS
ORAL_TABLET | ORAL | Status: AC
  Filled 2017-12-19: qty 2

## 2017-12-19 MED ORDER — DIPHENHYDRAMINE HCL 25 MG PO CAPS
50.0000 mg | ORAL_CAPSULE | Freq: Once | ORAL | Status: AC
  Administered 2017-12-19: 50 mg via ORAL

## 2017-12-19 MED ORDER — SODIUM CHLORIDE 0.9 % IV SOLN
375.0000 mg/m2 | Freq: Once | INTRAVENOUS | Status: AC
  Administered 2017-12-19: 700 mg via INTRAVENOUS
  Filled 2017-12-19: qty 50

## 2017-12-19 MED ORDER — SODIUM CHLORIDE 0.9 % IV SOLN
Freq: Once | INTRAVENOUS | Status: AC
  Administered 2017-12-19: 10:00:00 via INTRAVENOUS
  Filled 2017-12-19: qty 250

## 2017-12-19 MED ORDER — DIPHENHYDRAMINE HCL 25 MG PO CAPS
ORAL_CAPSULE | ORAL | Status: AC
  Filled 2017-12-19: qty 2

## 2017-12-19 MED ORDER — ACETAMINOPHEN 325 MG PO TABS
650.0000 mg | ORAL_TABLET | Freq: Once | ORAL | Status: AC
  Administered 2017-12-19: 650 mg via ORAL

## 2017-12-19 NOTE — Patient Instructions (Signed)
[facility] Discharge Instructions for Patients Receiving Chemotherapy  Today you received the following chemotherapy agents:  Rituxan.  To help prevent nausea and vomiting after your treatment, we encourage you to take your nausea medication as directed.   If you develop nausea and vomiting that is not controlled by your nausea medication, call the clinic.   BELOW ARE SYMPTOMS THAT SHOULD BE REPORTED IMMEDIATELY:  *FEVER GREATER THAN 100.5 F  *CHILLS WITH OR WITHOUT FEVER  NAUSEA AND VOMITING THAT IS NOT CONTROLLED WITH YOUR NAUSEA MEDICATION  *UNUSUAL SHORTNESS OF BREATH  *UNUSUAL BRUISING OR BLEEDING  TENDERNESS IN MOUTH AND THROAT WITH OR WITHOUT PRESENCE OF ULCERS  *URINARY PROBLEMS  *BOWEL PROBLEMS  UNUSUAL RASH Items with * indicate a potential emergency and should be followed up as soon as possible.  Feel free to call the clinic should you have any questions or concerns. The clinic phone number is [phone].  Please show the CHEMO ALERT CARD at check-in to the Emergency Department and triage nurse.   

## 2017-12-19 NOTE — Progress Notes (Signed)
Eric [name] [phone]   [phone]  OFFICE PROGRESS NOTE  DIAGNOSIS: Bulky stage IV low-grade lymphoma diagnosed in November 2009.  PRIOR THERAPY: 1. Status post 7 cycles of systemic chemotherapy with CHOP/Rituxan. Last dose was given May 2010. 2. Status post 6 cycles of maintenance Rituxan 375 mg/sq m given every 2 months. Last dose was given on May 29, 2010, and the patient was lost to followup at that time. He received another dose on 12/11/2010, then again missed 2 doses. 3. systemic chemotherapy with Rituxan 375 mg/M2 on day 1 and that bendamustine 90 mg/M2 on days 1 and 2 every 4 weeks, status post 6 cycles. 4. Maintenance Rituxan 375 mg/M2 every 2 months, status post 12 cycles 5.  CURRENT THERAPY: Rituxan 375 MG/M2 given weekly for 4 weeks and this is followed by maintenance treatment every 2 months, status post 21 cycles.  INTERVAL HISTORY: Eric [name] 77 y.o. male returns to the clinic today for 33-month follow-up visit.  The patient is feeling fine today with no concerning complaints.  He denied having any chest pain, shortness of breath, cough or hemoptysis.  He denied having any significant weight loss or night sweats.  He has no nausea, vomiting, diarrhea or constipation.  He has no headache or visual changes.  The patient is here today for evaluation before starting cycle #22 of his treatment with maintenance Rituxan.   MEDICAL HISTORY: Past Medical History:  Diagnosis Date  . Cancer ([place])   . Coronary atherosclerosis of native coronary artery   . Encounter for antineoplastic chemotherapy 06/20/2015  . GERD (gastroesophageal reflux disease)   . History of heart attack   . Hyperlipidemia   . Myocardial infarction ([place])   . Non Hodgkin's lymphoma ([place])     ALLERGIES:  is allergic to neosporin [neomycin-bacitracin zn-polymyx].  MEDICATIONS:  Current Outpatient Medications  Medication Sig Dispense Refill  . aspirin EC 81 MG  tablet Take 81 mg by mouth at bedtime.    Marland Kitchen atorvastatin (LIPITOR) 40 MG tablet Take 1 tablet (40 mg total) by mouth daily at 6 PM. 90 tablet 3  . diphenhydrAMINE (BENADRYL) 25 MG tablet Take 25 mg by mouth at bedtime as needed for sleep.     Marland Kitchen omeprazole (PRILOSEC) 20 MG capsule Take 20 mg by mouth daily as needed (acid relfux).    . sildenafil (VIAGRA) 25 MG tablet Take 1 tablet (25 mg total) by mouth daily as needed. Take 2-3 tablets by mouth once daily as needed. 90 tablet 6   No current facility-administered medications for this visit.     SURGICAL HISTORY:  Past Surgical History:  Procedure Laterality Date  . CORONARY STENT PLACEMENT    . LEFT HEART CATHETERIZATION WITH CORONARY ANGIOGRAM N/A 03/03/2012   Procedure: LEFT HEART CATHETERIZATION WITH CORONARY ANGIOGRAM;  Surgeon: [name], MD;  Location: [facility] CATH LAB;  Service: Cardiovascular;  Laterality: N/A;  . PERCUTANEOUS CORONARY STENT INTERVENTION (PCI-S) N/A 03/04/2012   Procedure: PERCUTANEOUS CORONARY STENT INTERVENTION (PCI-S);  Surgeon: [name], MD;  Location: [facility] CATH LAB;  Service: Cardiovascular;  Laterality: N/A;    REVIEW OF SYSTEMS:  A comprehensive review of systems was negative.   PHYSICAL EXAMINATION: General appearance: alert, cooperative and no distress Head: Normocephalic, without obvious abnormality, atraumatic Neck: no adenopathy, no JVD, supple, symmetrical, trachea midline and thyroid not enlarged, symmetric, no tenderness/mass/nodules Lymph nodes: Cervical, supraclavicular, and axillary nodes normal. Resp: clear to auscultation bilaterally Back: symmetric, no  curvature. ROM normal. No CVA tenderness. Cardio: regular rate and rhythm, S1, S2 normal, no murmur, click, rub or gallop GI: soft, non-tender; bowel sounds normal; no masses,  no organomegaly Extremities: extremities normal, atraumatic, no cyanosis or edema  ECOG PERFORMANCE STATUS: 0 - Asymptomatic  Blood pressure 132/87, pulse 69,  temperature 98.2 F (36.8 C), temperature source Oral, resp. rate 16, height 5\' 7"  (1.702 m), weight 156 lb 9.6 oz (71 kg), SpO2 99 %.  LABORATORY DATA: Lab Results  Component Value Date   WBC 3.7 (L) 12/19/2017   HGB 14.3 12/19/2017   HCT 42.4 12/19/2017   MCV 87.6 12/19/2017   PLT 216 12/19/2017      Chemistry      Component Value Date/Time   NA 142 12/19/2017 0835   NA 140 12/09/2017 0859   NA 140 02/14/2017 0804   K 4.4 12/19/2017 0835   K 4.4 02/14/2017 0804   CL 106 12/19/2017 0835   CL 106 08/06/2012 1244   CO2 27 12/19/2017 0835   CO2 22 02/14/2017 0804   BUN 19 12/19/2017 0835   BUN 14 12/09/2017 0859   BUN 14.8 02/14/2017 0804   CREATININE 1.07 12/19/2017 0835   CREATININE 1.1 02/14/2017 0804      Component Value Date/Time   CALCIUM 9.6 12/19/2017 0835   CALCIUM 9.2 02/14/2017 0804   ALKPHOS 126 12/19/2017 0835   ALKPHOS 138 02/14/2017 0804   AST 26 12/19/2017 0835   AST 29 02/14/2017 0804   ALT 24 12/19/2017 0835   ALT 27 02/14/2017 0804   BILITOT 0.4 12/19/2017 0835   BILITOT 0.61 02/14/2017 0804       RADIOGRAPHIC STUDIES: No results found.  ASSESSMENT AND PLAN:  This is a very pleasant 77 years old African-American male with bulky stage IV follicular lymphoma initially treated with CHOP/Rituxan followed by maintenance Rituxan. The patient had disease recurrence and he was restarted again on treatment with Rituxan every 2 months status post 21 cycles. The patient has been tolerating his treatment with maintenance Rituxan fairly well. I recommended for him to proceed with cycle #22 today as scheduled. I will see him back for follow-up visit in 2 months for evaluation after repeating CT scan of the chest, abdomen and pelvis for restaging of his disease. He was advised to call immediately if he has any concerning symptoms in the interval. The patient voices understanding of current disease status and treatment options and is in agreement with the  current care plan. All questions were answered. The patient knows to call the clinic with any problems, questions or concerns. We can certainly see the patient much sooner if necessary. I spent 15 minutes counseling the patient face to face. The total time spent in the appointment was 25 minutes.  Disclaimer: This note was dictated with voice recognition software. Similar sounding words can inadvertently be transcribed and may not be corrected upon review.

## 2017-12-19 NOTE — Telephone Encounter (Signed)
Appts already scheudled per 10/17 los - no additional appts added - central radiology to contact patient with ct scan .

## 2018-01-23 DIAGNOSIS — N138 Other obstructive and reflux uropathy: Secondary | ICD-10-CM | POA: Diagnosis not present

## 2018-01-23 DIAGNOSIS — Z48816 Encounter for surgical aftercare following surgery on the genitourinary system: Secondary | ICD-10-CM | POA: Diagnosis not present

## 2018-01-23 DIAGNOSIS — N471 Phimosis: Secondary | ICD-10-CM | POA: Diagnosis not present

## 2018-01-23 DIAGNOSIS — N401 Enlarged prostate with lower urinary tract symptoms: Secondary | ICD-10-CM | POA: Diagnosis not present

## 2018-02-11 DIAGNOSIS — C8290 Follicular lymphoma, unspecified, unspecified site: Secondary | ICD-10-CM | POA: Diagnosis not present

## 2018-02-11 DIAGNOSIS — N4829 Other inflammatory disorders of penis: Secondary | ICD-10-CM | POA: Diagnosis not present

## 2018-02-11 DIAGNOSIS — N471 Phimosis: Secondary | ICD-10-CM | POA: Diagnosis not present

## 2018-02-11 DIAGNOSIS — I252 Old myocardial infarction: Secondary | ICD-10-CM | POA: Diagnosis not present

## 2018-02-11 DIAGNOSIS — I2510 Atherosclerotic heart disease of native coronary artery without angina pectoris: Secondary | ICD-10-CM | POA: Diagnosis not present

## 2018-02-11 DIAGNOSIS — K219 Gastro-esophageal reflux disease without esophagitis: Secondary | ICD-10-CM | POA: Diagnosis not present

## 2018-02-12 ENCOUNTER — Telehealth: Payer: Self-pay | Admitting: Internal Medicine

## 2018-02-12 NOTE — Telephone Encounter (Signed)
Scheduled appt per 12/11 sch message - pt is aware of appt date and time

## 2018-02-13 ENCOUNTER — Inpatient Hospital Stay: Payer: Medicare Other | Attending: Internal Medicine

## 2018-02-13 ENCOUNTER — Telehealth: Payer: Self-pay | Admitting: Internal Medicine

## 2018-02-13 DIAGNOSIS — Z79899 Other long term (current) drug therapy: Secondary | ICD-10-CM | POA: Diagnosis not present

## 2018-02-13 DIAGNOSIS — C8208 Follicular lymphoma grade I, lymph nodes of multiple sites: Secondary | ICD-10-CM | POA: Insufficient documentation

## 2018-02-13 DIAGNOSIS — K219 Gastro-esophageal reflux disease without esophagitis: Secondary | ICD-10-CM | POA: Diagnosis not present

## 2018-02-13 DIAGNOSIS — Z7982 Long term (current) use of aspirin: Secondary | ICD-10-CM | POA: Diagnosis not present

## 2018-02-13 LAB — CBC WITH DIFFERENTIAL (CANCER CENTER ONLY)
ABS IMMATURE GRANULOCYTES: 0.21 10*3/uL — AB (ref 0.00–0.07)
Basophils Absolute: 0 10*3/uL (ref 0.0–0.1)
Basophils Relative: 1 %
EOS PCT: 6 %
Eosinophils Absolute: 0.3 10*3/uL (ref 0.0–0.5)
HCT: 43.4 % (ref 39.0–52.0)
HEMOGLOBIN: 14.2 g/dL (ref 13.0–17.0)
Immature Granulocytes: 4 %
LYMPHS PCT: 26 %
Lymphs Abs: 1.3 10*3/uL (ref 0.7–4.0)
MCH: 29.6 pg (ref 26.0–34.0)
MCHC: 32.7 g/dL (ref 30.0–36.0)
MCV: 90.4 fL (ref 80.0–100.0)
MONOS PCT: 17 %
Monocytes Absolute: 0.9 10*3/uL (ref 0.1–1.0)
NEUTROS ABS: 2.4 10*3/uL (ref 1.7–7.7)
NEUTROS PCT: 46 %
NRBC: 0 % (ref 0.0–0.2)
Platelet Count: 195 10*3/uL (ref 150–400)
RBC: 4.8 MIL/uL (ref 4.22–5.81)
RDW: 14.2 % (ref 11.5–15.5)
WBC: 5.1 10*3/uL (ref 4.0–10.5)

## 2018-02-13 LAB — CMP (CANCER CENTER ONLY)
ALBUMIN: 3.8 g/dL (ref 3.5–5.0)
ALT: 15 U/L (ref 0–44)
AST: 27 U/L (ref 15–41)
Alkaline Phosphatase: 119 U/L (ref 38–126)
Anion gap: 11 (ref 5–15)
BUN: 15 mg/dL (ref 8–23)
CO2: 24 mmol/L (ref 22–32)
CREATININE: 1.11 mg/dL (ref 0.61–1.24)
Calcium: 9.1 mg/dL (ref 8.9–10.3)
Chloride: 109 mmol/L (ref 98–111)
GFR, Estimated: >60 mL/min (ref >60)
GLUCOSE: 78 mg/dL (ref 70–99)
Potassium: 4.3 mmol/L (ref 3.5–5.1)
SODIUM: 144 mmol/L (ref 135–145)
Total Bilirubin: 0.4 mg/dL (ref 0.3–1.2)
Total Protein: 6.6 g/dL (ref 6.5–8.1)

## 2018-02-13 LAB — LACTATE DEHYDROGENASE: LDH: 215 U/L — AB (ref 98–192)

## 2018-02-13 NOTE — Telephone Encounter (Signed)
Patient came in to get contrast and instructions.

## 2018-02-14 ENCOUNTER — Ambulatory Visit (HOSPITAL_COMMUNITY)
Admission: RE | Admit: 2018-02-14 | Discharge: 2018-02-14 | Disposition: A | Discharge status: Home / Self Care | Payer: Medicare Other | Source: Ambulatory Visit | Attending: Internal Medicine | Admitting: Internal Medicine

## 2018-02-14 ENCOUNTER — Encounter (HOSPITAL_COMMUNITY): Payer: Self-pay

## 2018-02-14 DIAGNOSIS — C8208 Follicular lymphoma grade I, lymph nodes of multiple sites: Secondary | ICD-10-CM | POA: Insufficient documentation

## 2018-02-14 DIAGNOSIS — C8290 Follicular lymphoma, unspecified, unspecified site: Secondary | ICD-10-CM | POA: Diagnosis not present

## 2018-02-14 DIAGNOSIS — Z5111 Encounter for antineoplastic chemotherapy: Secondary | ICD-10-CM | POA: Diagnosis not present

## 2018-02-14 MED ORDER — IOHEXOL 300 MG/ML  SOLN
100.0000 mL | Freq: Once | INTRAMUSCULAR | Status: AC | PRN
  Administered 2018-02-14: 100 mL via INTRAVENOUS

## 2018-02-14 MED ORDER — SODIUM CHLORIDE (PF) 0.9 % IJ SOLN
INTRAMUSCULAR | Status: AC
  Filled 2018-02-14: qty 50

## 2018-02-18 ENCOUNTER — Ambulatory Visit: Payer: Medicare Other

## 2018-02-19 ENCOUNTER — Other Ambulatory Visit: Payer: Self-pay | Admitting: Medical Oncology

## 2018-02-19 DIAGNOSIS — C8208 Follicular lymphoma grade I, lymph nodes of multiple sites: Secondary | ICD-10-CM

## 2018-02-20 ENCOUNTER — Encounter: Payer: Self-pay | Admitting: Internal Medicine

## 2018-02-20 ENCOUNTER — Inpatient Hospital Stay: Payer: Medicare Other

## 2018-02-20 ENCOUNTER — Inpatient Hospital Stay (HOSPITAL_BASED_OUTPATIENT_CLINIC_OR_DEPARTMENT_OTHER): Payer: Medicare Other | Admitting: Internal Medicine

## 2018-02-20 VITALS — BP 145/81 | HR 70 | Temp 98.1°F | Resp 16 | Ht 67.0 in | Wt 158.9 lb

## 2018-02-20 DIAGNOSIS — C8208 Follicular lymphoma grade I, lymph nodes of multiple sites: Secondary | ICD-10-CM

## 2018-02-20 DIAGNOSIS — Z5111 Encounter for antineoplastic chemotherapy: Secondary | ICD-10-CM

## 2018-02-20 DIAGNOSIS — K219 Gastro-esophageal reflux disease without esophagitis: Secondary | ICD-10-CM

## 2018-02-20 DIAGNOSIS — Z79899 Other long term (current) drug therapy: Secondary | ICD-10-CM

## 2018-02-20 DIAGNOSIS — Z7982 Long term (current) use of aspirin: Secondary | ICD-10-CM | POA: Diagnosis not present

## 2018-02-20 DIAGNOSIS — Z7189 Other specified counseling: Secondary | ICD-10-CM

## 2018-02-20 LAB — CMP (CANCER CENTER ONLY)
ALT: 14 U/L (ref 0–44)
AST: 20 U/L (ref 15–41)
Albumin: 3.5 g/dL (ref 3.5–5.0)
Alkaline Phosphatase: 118 U/L (ref 38–126)
Anion gap: 8 (ref 5–15)
BUN: 9 mg/dL (ref 8–23)
CHLORIDE: 105 mmol/L (ref 98–111)
CO2: 28 mmol/L (ref 22–32)
Calcium: 9.1 mg/dL (ref 8.9–10.3)
Creatinine: 0.97 mg/dL (ref 0.61–1.24)
GFR, Est AFR Am: >60 mL/min (ref >60)
Glucose, Bld: 83 mg/dL (ref 70–99)
Potassium: 4.4 mmol/L (ref 3.5–5.1)
Sodium: 141 mmol/L (ref 135–145)
Total Bilirubin: 0.5 mg/dL (ref 0.3–1.2)
Total Protein: 6.2 g/dL — ABNORMAL LOW (ref 6.5–8.1)

## 2018-02-20 LAB — CBC WITH DIFFERENTIAL (CANCER CENTER ONLY)
Abs Immature Granulocytes: 0.07 10*3/uL (ref 0.00–0.07)
BASOS ABS: 0 10*3/uL (ref 0.0–0.1)
Basophils Relative: 1 %
Eosinophils Absolute: 0.2 10*3/uL (ref 0.0–0.5)
Eosinophils Relative: 6 %
HCT: 41.7 % (ref 39.0–52.0)
Hemoglobin: 13.6 g/dL (ref 13.0–17.0)
Immature Granulocytes: 2 %
Lymphocytes Relative: 31 %
Lymphs Abs: 1.2 10*3/uL (ref 0.7–4.0)
MCH: 29.1 pg (ref 26.0–34.0)
MCHC: 32.6 g/dL (ref 30.0–36.0)
MCV: 89.1 fL (ref 80.0–100.0)
Monocytes Absolute: 0.6 10*3/uL (ref 0.1–1.0)
Monocytes Relative: 17 %
NRBC: 0 % (ref 0.0–0.2)
Neutro Abs: 1.6 10*3/uL — ABNORMAL LOW (ref 1.7–7.7)
Neutrophils Relative %: 43 %
Platelet Count: 219 10*3/uL (ref 150–400)
RBC: 4.68 MIL/uL (ref 4.22–5.81)
RDW: 13.4 % (ref 11.5–15.5)
WBC: 3.7 10*3/uL — AB (ref 4.0–10.5)

## 2018-02-20 LAB — LACTATE DEHYDROGENASE: LDH: 230 U/L — ABNORMAL HIGH (ref 98–192)

## 2018-02-20 MED ORDER — PROCHLORPERAZINE MALEATE 10 MG PO TABS: 10.0000 mg | ORAL_TABLET | Freq: Four times a day (QID) | ORAL | 0 refills | Status: DC | PRN

## 2018-02-20 NOTE — Patient Instructions (Signed)
Non-Hodgkin Lymphoma, Adult Non-Hodgkin lymphoma (NHL) is a cancer of the lymphatic system. The lymphatic system is part of your body's defense (immune) system, which protects the body from infections, germs, and diseases. NHL affects a type of white blood cell called lymphocytes. There are different types of NHL. The kind of NHL you have depends on the type of cells it affects and how quickly it grows and spreads. What are the causes? The cause of NHL is not known. What increases the risk? Risks factors for NHL include:  Having a weak immune system, especially after an organ transplant.  Specific bacterial and viral infections including: ? HIV. ? Epstein-Barr virus.  Age. NHL is more common in people over the age of 16. What are the signs or symptoms? Symptoms of NHL may include:  Swelling of the lymph nodes in your neck, armpits, or groin.  Fever.  Chills.  Sweating without cause.  Itchy skin.  Fatigue.  Unexplained weight loss.  Coughing, breathing trouble, and chest pain.  Unexplained weakness.  Swelling of your face, legs, or stomach (abdomen).  Abdominal pain.  Nausea and vomiting.  Loss of appetite.  Headache.  Difficulty concentrating.  Changes in personality.  Seizures. How is this diagnosed? The diagnosis of NHL may include:  A physical exam and medical history.  Blood tests.  Chest X-ray.  Testing of body tissue (biopsy) of your lymph gland or bone marrow.  Different types of scans, including: ? CT scans. ? Positron emission tomography (PET) scan. ? Gallium scan. How is this treated? NHL can be treated in different ways. This depends on your symptoms, the stage of NHL when you were first diagnosed, and the speed with which it is spreading. Treatment may include:  Radiation therapy. This uses radiation to destroy cancer cells.  Chemotherapy. This uses medicine to destroy the cancer cells.  Biological therapy. This uses the body's  immune system to destroy cancer cells.  Bone marrow transplantation.  Blood or platelet transfusions. This may be needed if your blood counts are low. Your cancer will be staged to determine its severity and extent. Your health care provider does staging to find out whether the cancer has spread, and if so, to what parts of your body. You may need to have more tests to determine the stage of your cancer. Follow these instructions at home:  Take medicines only as directed by your health care provider.  Plan rest periods when fatigued.  Keep all follow-up visits as directed by your health care provider. This is important. Contact a health care provider if:  You have new symptoms of NHL.  You have signs of infection. These might include: ? Fever. ? Chills. ? Sore throat. ? Headache. ? Nausea. ? Vomiting. ? Diarrhea. This information is not intended to replace advice given to you by your health care provider. Make sure you discuss any questions you have with your health care provider. Document Released: 09/02/2006 Document Revised: 07/28/2015 Document Reviewed: 12/30/2014 Elsevier Interactive Patient Education  2019 [employer] Bendamustine Injection What is this medicine? BENDAMUSTINE (BEN da MUS teen) is a chemotherapy drug. It is used to treat chronic lymphocytic leukemia and non-Hodgkin lymphoma. This medicine may be used for other purposes; ask your health care provider or pharmacist if you have questions. COMMON BRAND NAME(S): BENDEKA, Treanda What should I tell my health care provider before I take this medicine? They need to know if you have any of these conditions: -infection (especially a virus infection such as chickenpox,  cold sores, or herpes) -kidney disease -liver disease -an unusual or allergic reaction to bendamustine, mannitol, other medicines, foods, dyes, or preservatives -pregnant or trying to get pregnant -breast-feeding How should I use this  medicine? This medicine is for infusion into a vein. It is given by a health care professional in a hospital or clinic setting. Talk to your pediatrician regarding the use of this medicine in children. Special care may be needed. Overdosage: If you think you have taken too much of this medicine contact a poison control center or emergency room at once. NOTE: This medicine is only for you. Do not share this medicine with others. What if I miss a dose? It is important not to miss your dose. Call your doctor or health care professional if you are unable to keep an appointment. What may interact with this medicine? Do not take this medicine with any of the following medications: -clozapine This medicine may also interact with the following medications: -atazanavir -cimetidine -ciprofloxacin -enoxacin -fluvoxamine -medicines for seizures like carbamazepine and phenobarbital -mexiletine -rifampin -tacrine -thiabendazole -zileuton This list may not describe all possible interactions. Give your health care provider a list of all the medicines, herbs, non-prescription drugs, or dietary supplements you use. Also tell them if you smoke, drink alcohol, or use illegal drugs. Some items may interact with your medicine. What should I watch for while using this medicine? This drug may make you feel generally unwell. This is not uncommon, as chemotherapy can affect healthy cells as well as cancer cells. Report any side effects. Continue your course of treatment even though you feel ill unless your doctor tells you to stop. You may need blood work done while you are taking this medicine. Call your doctor or health care professional for advice if you get a fever, chills or sore throat, or other symptoms of a cold or flu. Do not treat yourself. This drug decreases your body's ability to fight infections. Try to avoid being around people who are sick. This medicine may increase your risk to bruise or bleed.  Call your doctor or health care professional if you notice any unusual bleeding. Talk to your doctor about your risk of cancer. You may be more at risk for certain types of cancers if you take this medicine. Do not become pregnant while taking this medicine or for 3 months after stopping it. Women should inform their doctor if they wish to become pregnant or think they might be pregnant. Men should not father a child while taking this medicine and for 3 months after stopping it.There is a potential for serious side effects to an unborn child. Talk to your health care professional or pharmacist for more information. Do not breast-feed an infant while taking this medicine. This medicine may interfere with the ability to have a child. You should talk with your doctor or health care professional if you are concerned about your fertility. What side effects may I notice from receiving this medicine? Side effects that you should report to your doctor or health care professional as soon as possible: -allergic reactions like skin rash, itching or hives, swelling of the face, lips, or tongue -low blood counts - this medicine may decrease the number of white blood cells, red blood cells and platelets. You may be at increased risk for infections and bleeding. -redness, blistering, peeling or loosening of the skin, including inside the mouth -signs of infection - fever or chills, cough, sore throat, pain or difficulty passing urine -signs of  decreased platelets or bleeding - bruising, pinpoint red spots on the skin, black, tarry stools, blood in the urine -signs of decreased red blood cells - unusually weak or tired, fainting spells, lightheadedness -signs and symptoms of kidney injury like trouble passing urine or change in the amount of urine -signs and symptoms of liver injury like dark yellow or brown urine; general ill feeling or flu-like symptoms; light-colored stools; loss of appetite; nausea; right upper  belly pain; unusually weak or tired; yellowing of the eyes or skin Side effects that usually do not require medical attention (report to your doctor or health care professional if they continue or are bothersome): -constipation -decreased appetite -diarrhea -headache -mouth sores -nausea/vomiting -tiredness This list may not describe all possible side effects. Call your doctor for medical advice about side effects. You may report side effects to FDA at 1-800-FDA-1088. Where should I keep my medicine? This drug is given in a hospital or clinic and will not be stored at home. NOTE: This sheet is a summary. It may not cover all possible information. If you have questions about this medicine, talk to your doctor, pharmacist, or health care provider.  2019 Elsevier/[employer] Standard (2014-12-23 08:45:41)  Rituximab injection What is this medicine? RITUXIMAB (ri TUX i mab) is a monoclonal antibody. It is used to treat certain types of cancer like non-Hodgkin lymphoma and chronic lymphocytic leukemia. It is also used to treat rheumatoid arthritis, granulomatosis with polyangiitis (or Wegener's granulomatosis), microscopic polyangiitis, and pemphigus vulgaris. This medicine may be used for other purposes; ask your health care provider or pharmacist if you have questions. COMMON BRAND NAME(S): Rituxan What should I tell my health care provider before I take this medicine? They need to know if you have any of these conditions: -heart disease -infection (especially a virus infection such as hepatitis B, chickenpox, cold sores, or herpes) -immune system problems -irregular heartbeat -kidney disease -low blood counts, like low white cell, platelet, or red cell counts -lung or breathing disease, like asthma -recently received or scheduled to receive a vaccine -an unusual or allergic reaction to rituximab, other medicines, foods, dyes, or preservatives -pregnant or trying to get  pregnant -breast-feeding How should I use this medicine? This medicine is for infusion into a vein. It is administered in a hospital or clinic by a specially trained health care professional. A special MedGuide will be given to you by the pharmacist with each prescription and refill. Be sure to read this information carefully each time. Talk to your pediatrician regarding the use of this medicine in children. This medicine is not approved for use in children. Overdosage: If you think you have taken too much of this medicine contact a poison control center or emergency room at once. NOTE: This medicine is only for you. Do not share this medicine with others. What if I miss a dose? It is important not to miss a dose. Call your doctor or health care professional if you are unable to keep an appointment. What may interact with this medicine? -cisplatin -live virus vaccines This list may not describe all possible interactions. Give your health care provider a list of all the medicines, herbs, non-prescription drugs, or dietary supplements you use. Also tell them if you smoke, drink alcohol, or use illegal drugs. Some items may interact with your medicine. What should I watch for while using this medicine? Your condition will be monitored carefully while you are receiving this medicine. You may need blood work done while you are taking  this medicine. This medicine can cause serious allergic reactions. To reduce your risk you may need to take medicine before treatment with this medicine. Take your medicine as directed. In some patients, this medicine may cause a serious brain infection that may cause death. If you have any problems seeing, thinking, speaking, walking, or standing, tell your healthcare professional right away. If you cannot reach your healthcare professional, urgently seek other source of medical care. Call your doctor or health care professional for advice if you get a fever, chills or  sore throat, or other symptoms of a cold or flu. Do not treat yourself. This drug decreases your body's ability to fight infections. Try to avoid being around people who are sick. Do not become pregnant while taking this medicine or for 12 months after stopping it. Women should inform their doctor if they wish to become pregnant or think they might be pregnant. There is a potential for serious side effects to an unborn child. Talk to your health care professional or pharmacist for more information. Do not breast-feed an infant while taking this medicine or for 6 months after stopping it. What side effects may I notice from receiving this medicine? Side effects that you should report to your doctor or health care professional as soon as possible: -allergic reactions like skin rash, itching or hives; swelling of the face, lips, or tongue -breathing problems -chest pain -changes in vision -diarrhea -headache with fever, neck stiffness, sensitivity to light, nausea, or confusion -fast, irregular heartbeat -loss of memory -low blood counts - this medicine may decrease the number of white blood cells, red blood cells and platelets. You may be at increased risk for infections and bleeding. -mouth sores -problems with balance, talking, or walking -redness, blistering, peeling or loosening of the skin, including inside the mouth -signs of infection - fever or chills, cough, sore throat, pain or difficulty passing urine -signs and symptoms of kidney injury like trouble passing urine or change in the amount of urine -signs and symptoms of liver injury like dark yellow or brown urine; general ill feeling or flu-like symptoms; light-colored stools; loss of appetite; nausea; right upper belly pain; unusually weak or tired; yellowing of the eyes or skin -signs and symptoms of low blood pressure like dizziness; feeling faint or lightheaded, falls; unusually weak or tired -stomach pain -swelling of the ankles,  feet, hands -unusual bleeding or bruising -vomiting Side effects that usually do not require medical attention (report to your doctor or health care professional if they continue or are bothersome): -headache -joint pain -muscle cramps or muscle pain -nausea -tiredness This list may not describe all possible side effects. Call your doctor for medical advice about side effects. You may report side effects to FDA at 1-800-FDA-1088. Where should I keep my medicine? This drug is given in a hospital or clinic and will not be stored at home. NOTE: This sheet is a summary. It may not cover all possible information. If you have questions about this medicine, talk to your doctor, pharmacist, or health care provider.  2019 Elsevier/Gold Standard (2017-02-01 13:04:32)

## 2018-02-20 NOTE — Progress Notes (Signed)
[place] [phone]   [phone]  OFFICE PROGRESS NOTE  DIAGNOSIS: Bulky stage IV low-grade lymphoma diagnosed in November 2009.  PRIOR THERAPY: 1. Status post 7 cycles of systemic chemotherapy with CHOP/Rituxan. Last dose was given May 2010. 2. Status post 6 cycles of maintenance Rituxan 375 mg/sq m given every 2 months. Last dose was given on May 29, 2010, and the patient was lost to followup at that time. He received another dose on 12/11/2010, then again missed 2 doses. 3. systemic chemotherapy with Rituxan 375 mg/M2 on day 1 and that bendamustine 90 mg/M2 on days 1 and 2 every 4 weeks, status post 6 cycles. 4. Maintenance Rituxan 375 mg/M2 every 2 months, status post 12 cycles  5. Rituxan 375 MG/M2 given weekly for 4 weeks and this is followed by maintenance treatment every 2 months, status post 22 cycles discontinued secondary to disease progression.  CURRENT THERAPY: Systemic chemotherapy with bendamustine 90 mg/M2 on days 1 and 2 and Rituxan 375 mg/M2 on day 1 every 4 weeks.  First dose March 03, 2018  INTERVAL HISTORY: Eric [name] 77 y.o. male returns to the clinic today for follow-up visit.  The patient is feeling fine today with no concerning complaints.  He denied having any chest pain, shortness of breath, cough or hemoptysis.  He denied having any recent weight loss or night sweats.  He has no nausea, vomiting, diarrhea or constipation.  He denied having any abdominal pain.  He denied having any headache or visual changes.  He has been tolerating his previous treatment with maintenance Rituxan fairly well.  The patient had repeat CT scan of the chest, abdomen and pelvis performed recently and is here for evaluation and discussion of his discuss results.   MEDICAL HISTORY: Past Medical History:  Diagnosis Date  . Cancer ([place])   . Coronary atherosclerosis of native coronary artery   . Encounter for antineoplastic chemotherapy  06/20/2015  . GERD (gastroesophageal reflux disease)   . History of heart attack   . Hyperlipidemia   . Myocardial infarction ([place])   . Non Hodgkin's lymphoma ([place])     ALLERGIES:  is allergic to neosporin [neomycin-bacitracin zn-polymyx].  MEDICATIONS:  Current Outpatient Medications  Medication Sig Dispense Refill  . acetaminophen (TYLENOL) 325 MG tablet Take by mouth.    . Polyethylene Glycol 3350 (PEG 3350) POWD Take by mouth.    . traMADol (ULTRAM) 50 MG tablet Take by mouth.    Marland Kitchen aspirin EC 81 MG tablet Take 81 mg by mouth at bedtime.    Marland Kitchen atorvastatin (LIPITOR) 40 MG tablet Take 1 tablet (40 mg total) by mouth daily at 6 PM. 90 tablet 3  . diphenhydrAMINE (BENADRYL) 25 MG tablet Take 25 mg by mouth at bedtime as needed for sleep.     Marland Kitchen omeprazole (PRILOSEC) 20 MG capsule Take 20 mg by mouth daily as needed (acid relfux).    . sildenafil (VIAGRA) 100 MG tablet Take 100 mg by mouth daily as needed.  3  . sildenafil (VIAGRA) 25 MG tablet Take 1 tablet (25 mg total) by mouth daily as needed. Take 2-3 tablets by mouth once daily as needed. 90 tablet 6   No current facility-administered medications for this visit.     SURGICAL HISTORY:  Past Surgical History:  Procedure Laterality Date  . CORONARY STENT PLACEMENT    . LEFT HEART CATHETERIZATION WITH CORONARY ANGIOGRAM N/A 03/03/2012   Procedure: LEFT HEART CATHETERIZATION WITH CORONARY  ANGIOGRAM;  Surgeon: [name], MD;  Location: [facility] CATH LAB;  Service: Cardiovascular;  Laterality: N/A;  . PERCUTANEOUS CORONARY STENT INTERVENTION (PCI-S) N/A 03/04/2012   Procedure: PERCUTANEOUS CORONARY STENT INTERVENTION (PCI-S);  Surgeon: [name], MD;  Location: [facility] CATH LAB;  Service: Cardiovascular;  Laterality: N/A;    REVIEW OF SYSTEMS:  Constitutional: negative Eyes: negative Ears, nose, mouth, throat, and face: negative Respiratory: negative Cardiovascular: negative Gastrointestinal:  negative Genitourinary:negative Integument/breast: negative Hematologic/lymphatic: negative Musculoskeletal:negative Neurological: negative Behavioral/Psych: negative Endocrine: negative Allergic/Immunologic: negative   PHYSICAL EXAMINATION: General appearance: alert, cooperative and no distress Head: Normocephalic, without obvious abnormality, atraumatic Neck: no adenopathy, no JVD, supple, symmetrical, trachea midline and thyroid not enlarged, symmetric, no tenderness/mass/nodules Lymph nodes: Cervical, supraclavicular, and axillary nodes normal. Resp: clear to auscultation bilaterally Back: symmetric, no curvature. ROM normal. No CVA tenderness. Cardio: regular rate and rhythm, S1, S2 normal, no murmur, click, rub or gallop GI: soft, non-tender; bowel sounds normal; no masses,  no organomegaly Extremities: extremities normal, atraumatic, no cyanosis or edema  ECOG PERFORMANCE STATUS: 0 - Asymptomatic  Blood pressure (!) 145/81, pulse 70, temperature 98.1 F (36.7 C), temperature source Oral, resp. rate 16, height 5\' 7"  (1.702 m), weight 158 lb 14.4 oz (72.1 kg), SpO2 100 %.  LABORATORY DATA: Lab Results  Component Value Date   WBC 3.7 (L) 02/20/2018   HGB 13.6 02/20/2018   HCT 41.7 02/20/2018   MCV 89.1 02/20/2018   PLT 219 02/20/2018      Chemistry      Component Value Date/Time   NA 144 02/13/2018 0955   NA 140 12/09/2017 0859   NA 140 02/14/2017 0804   K 4.3 02/13/2018 0955   K 4.4 02/14/2017 0804   CL 109 02/13/2018 0955   CL 106 08/06/2012 1244   CO2 24 02/13/2018 0955   CO2 22 02/14/2017 0804   BUN 15 02/13/2018 0955   BUN 14 12/09/2017 0859   BUN 14.8 02/14/2017 0804   CREATININE 1.11 02/13/2018 0955   CREATININE 1.1 02/14/2017 0804      Component Value Date/Time   CALCIUM 9.1 02/13/2018 0955   CALCIUM 9.2 02/14/2017 0804   ALKPHOS 119 02/13/2018 0955   ALKPHOS 138 02/14/2017 0804   AST 27 02/13/2018 0955   AST 29 02/14/2017 0804   ALT 15  02/13/2018 0955   ALT 27 02/14/2017 0804   BILITOT 0.4 02/13/2018 0955   BILITOT 0.61 02/14/2017 0804       RADIOGRAPHIC STUDIES: Ct Chest W Contrast  Result Date: 02/14/2018 CLINICAL DATA:  Grade 1 follicular lymphoma originally diagnosed in 2009. Chemotherapy ongoing. EXAM: CT CHEST, ABDOMEN, AND PELVIS WITH CONTRAST TECHNIQUE: Multidetector CT imaging of the chest, abdomen and pelvis was performed following the standard protocol during bolus administration of intravenous contrast. CONTRAST:  130mL OMNIPAQUE IOHEXOL 300 MG/ML  SOLN COMPARISON:  CT 08/16/2017 and 03/01/2017. FINDINGS: CT CHEST FINDINGS Cardiovascular: Three-vessel coronary artery atherosclerosis with lesser involvement of the aorta and great vessels. No acute vascular findings. The heart size is normal. There is no pericardial effusion. Mediastinum/Nodes: There are no enlarged mediastinal, hilar or axillary lymph nodes. There are postsurgical changes in the right axilla. Stable moderate-sized hiatal hernia. The thyroid gland and trachea appear normal. Lungs/Pleura: No pleural effusion or pneumothorax. Stable mild emphysema and bibasilar atelectasis. No suspicious pulmonary nodule. Musculoskeletal/Chest wall: No chest wall mass or suspicious osseous findings. CT ABDOMEN AND PELVIS FINDINGS Hepatobiliary: The liver is normal in density without focal abnormality. No evidence  of gallstones, gallbladder wall thickening or biliary dilatation. Pancreas: Unremarkable. No pancreatic ductal dilatation or surrounding inflammatory changes. Spleen: Normal in size without focal abnormality. Adrenals/Urinary Tract: Both adrenal glands appear normal. There are stable bilateral renal cysts. The left collecting system is partially duplicated. No evidence of renal mass, urinary tract calculus or hydronephrosis. No focal bladder abnormality seen. Stomach/Bowel: No evidence of bowel wall thickening, distention or surrounding inflammatory change. The  appendix appears normal. Stable mild sigmoid colon diverticular changes. Vascular/Lymphatic: There is progressive upper abdominal lymphadenopathy. 11 mm aortocaval node on image 62/2 previously measured 9 mm. There is a 10 mm aortocaval node on image 61/2. Left mesenteric node measuring 25 mm on image 63/2 is nearly isodense to adjacent small bowel and most obvious on the coronal images (image 69/5). No enlarged pelvic lymph nodes are seen. Moderate diffuse aortic and branch vessel atherosclerosis. No acute vascular findings. Reproductive: Marked prostatic enlargement and heterogeneity again noted. The gland measures up to 8.6 x 6.9 cm transverse. Other: Stable small umbilical hernia containing only fat. No ascites. Musculoskeletal: No acute or significant osseous findings. Stable chronic T12 deformity. IMPRESSION: 1. Progressive retroperitoneal and mesenteric lymphadenopathy consistent with progressive lymphoma. No adenopathy identified within the chest or pelvis. The spleen appears normal. 2. The additional solid visceral organs appear stable without suspicious findings. Bilateral renal cysts. 3.  Aortic Atherosclerosis (ICD10-I70.0). 4. Marked prostatic enlargement. Electronically Signed   By: Richardean Sale M.D.   On: 02/14/2018 13:46   Ct Abdomen Pelvis W Contrast  Result Date: 02/14/2018 CLINICAL DATA:  Grade 1 follicular lymphoma originally diagnosed in 2009. Chemotherapy ongoing. EXAM: CT CHEST, ABDOMEN, AND PELVIS WITH CONTRAST TECHNIQUE: Multidetector CT imaging of the chest, abdomen and pelvis was performed following the standard protocol during bolus administration of intravenous contrast. CONTRAST:  129mL OMNIPAQUE IOHEXOL 300 MG/ML  SOLN COMPARISON:  CT 08/16/2017 and 03/01/2017. FINDINGS: CT CHEST FINDINGS Cardiovascular: Three-vessel coronary artery atherosclerosis with lesser involvement of the aorta and great vessels. No acute vascular findings. The heart size is normal. There is no  pericardial effusion. Mediastinum/Nodes: There are no enlarged mediastinal, hilar or axillary lymph nodes. There are postsurgical changes in the right axilla. Stable moderate-sized hiatal hernia. The thyroid gland and trachea appear normal. Lungs/Pleura: No pleural effusion or pneumothorax. Stable mild emphysema and bibasilar atelectasis. No suspicious pulmonary nodule. Musculoskeletal/Chest wall: No chest wall mass or suspicious osseous findings. CT ABDOMEN AND PELVIS FINDINGS Hepatobiliary: The liver is normal in density without focal abnormality. No evidence of gallstones, gallbladder wall thickening or biliary dilatation. Pancreas: Unremarkable. No pancreatic ductal dilatation or surrounding inflammatory changes. Spleen: Normal in size without focal abnormality. Adrenals/Urinary Tract: Both adrenal glands appear normal. There are stable bilateral renal cysts. The left collecting system is partially duplicated. No evidence of renal mass, urinary tract calculus or hydronephrosis. No focal bladder abnormality seen. Stomach/Bowel: No evidence of bowel wall thickening, distention or surrounding inflammatory change. The appendix appears normal. Stable mild sigmoid colon diverticular changes. Vascular/Lymphatic: There is progressive upper abdominal lymphadenopathy. 11 mm aortocaval node on image 62/2 previously measured 9 mm. There is a 10 mm aortocaval node on image 61/2. Left mesenteric node measuring 25 mm on image 63/2 is nearly isodense to adjacent small bowel and most obvious on the coronal images (image 69/5). No enlarged pelvic lymph nodes are seen. Moderate diffuse aortic and branch vessel atherosclerosis. No acute vascular findings. Reproductive: Marked prostatic enlargement and heterogeneity again noted. The gland measures up to 8.6 x 6.9 cm transverse. Other:  Stable small umbilical hernia containing only fat. No ascites. Musculoskeletal: No acute or significant osseous findings. Stable chronic T12  deformity. IMPRESSION: 1. Progressive retroperitoneal and mesenteric lymphadenopathy consistent with progressive lymphoma. No adenopathy identified within the chest or pelvis. The spleen appears normal. 2. The additional solid visceral organs appear stable without suspicious findings. Bilateral renal cysts. 3.  Aortic Atherosclerosis (ICD10-I70.0). 4. Marked prostatic enlargement. Electronically Signed   By: Richardean Sale M.D.   On: 02/14/2018 13:46    ASSESSMENT AND PLAN:  This is a very pleasant 77 years old African-American male with bulky stage IV follicular lymphoma initially treated with CHOP/Rituxan followed by maintenance Rituxan. The patient had disease recurrence and he was restarted again on treatment with Rituxan every 2 months status post 22 cycles. The patient has been tolerating this treatment well with no concerning adverse effects. He had repeat CT scan of the chest, abdomen and pelvis performed recently.  I personally and independently reviewed the scan images and discussed the result and showed the images to the patient today.  His a scan showed progressive retroperitoneal and mesenteric lymphadenopathy consistent with progressive lymphoma. I had a lengthy discussion with the patient today about his condition and treatment options.  I give the patient the option of continuous observation and monitoring versus systemic treatment with bendamustine 90 mg/M2 on days 1 and 2 in addition to Rituxan 375 mg/M2 on day 1 every 4 weeks.  The patient is interested in proceeding with systemic chemotherapy and he is expected to start the first cycle of this treatment on March 03, 2018. I discussed with the patient the adverse effect of this treatment and I gave him handouts about his treatment. He will come back for follow-up visit in around 5 weeks for evaluation with the start of cycle #2 of his chemotherapy. The patient was advised to call immediately if he has any concerning symptoms in the  interval. The patient voices understanding of current disease status and treatment options and is in agreement with the current care plan. All questions were answered. The patient knows to call the clinic with any problems, questions or concerns. We can certainly see the patient much sooner if necessary.   Disclaimer: This note was dictated with voice recognition software. Similar sounding words can inadvertently be transcribed and may not be corrected upon review.

## 2018-02-20 NOTE — Progress Notes (Signed)
START ON PATHWAY REGIMEN - Lymphoma and CLL     A cycle is every 28 days:     Bendamustine      Rituximab   **Always confirm dose/schedule in your pharmacy ordering system**  Patient Characteristics: Follicular Lymphoma, Grades 1, 2, and 3A, First Line, Stage III / IV, Symptomatic or Bulky Disease Disease Type: Follicular Lymphoma Disease Type: Not Applicable Disease Type: Not Applicable [place] Stage: III Tumor Grade: 2 Line of Therapy: First Line Disease Characteristics: Symptomatic or Bulky Disease Intent of Therapy: Non-Curative / Palliative Intent, Discussed with Patient

## 2018-02-28 ENCOUNTER — Telehealth: Payer: Self-pay

## 2018-02-28 NOTE — Telephone Encounter (Signed)
Tried calling patient to inform him of added appointment. Per 12/19 los

## 2018-03-03 ENCOUNTER — Inpatient Hospital Stay: Payer: Medicare Other

## 2018-03-03 ENCOUNTER — Telehealth: Payer: Self-pay | Admitting: Internal Medicine

## 2018-03-03 DIAGNOSIS — C8208 Follicular lymphoma grade I, lymph nodes of multiple sites: Secondary | ICD-10-CM

## 2018-03-03 LAB — CBC WITH DIFFERENTIAL (CANCER CENTER ONLY)
Abs Immature Granulocytes: 0.15 10*3/uL — ABNORMAL HIGH (ref 0.00–0.07)
BASOS ABS: 0.1 10*3/uL (ref 0.0–0.1)
Basophils Relative: 2 %
Eosinophils Absolute: 0.3 10*3/uL (ref 0.0–0.5)
Eosinophils Relative: 8 %
HCT: 44.8 % (ref 39.0–52.0)
Hemoglobin: 14.6 g/dL (ref 13.0–17.0)
Immature Granulocytes: 4 %
Lymphocytes Relative: 26 %
Lymphs Abs: 1.1 10*3/uL (ref 0.7–4.0)
MCH: 29.4 pg (ref 26.0–34.0)
MCHC: 32.6 g/dL (ref 30.0–36.0)
MCV: 90.3 fL (ref 80.0–100.0)
Monocytes Absolute: 0.8 10*3/uL (ref 0.1–1.0)
Monocytes Relative: 19 %
NRBC: 0 % (ref 0.0–0.2)
Neutro Abs: 1.7 10*3/uL (ref 1.7–7.7)
Neutrophils Relative %: 41 %
PLATELETS: 313 10*3/uL (ref 150–400)
RBC: 4.96 MIL/uL (ref 4.22–5.81)
RDW: 13.7 % (ref 11.5–15.5)
WBC: 4.1 10*3/uL (ref 4.0–10.5)

## 2018-03-03 LAB — CMP (CANCER CENTER ONLY)
ALT: 19 U/L (ref 0–44)
ANION GAP: 11 (ref 5–15)
AST: 21 U/L (ref 15–41)
Albumin: 3.6 g/dL (ref 3.5–5.0)
Alkaline Phosphatase: 117 U/L (ref 38–126)
BUN: 14 mg/dL (ref 8–23)
CO2: 23 mmol/L (ref 22–32)
Calcium: 9 mg/dL (ref 8.9–10.3)
Chloride: 109 mmol/L (ref 98–111)
Creatinine: 1.17 mg/dL (ref 0.61–1.24)
GFR, EST NON AFRICAN AMERICAN: 60 mL/min — AB (ref >60)
GFR, Est AFR Am: >60 mL/min (ref >60)
Glucose, Bld: 81 mg/dL (ref 70–99)
Potassium: 4.1 mmol/L (ref 3.5–5.1)
Sodium: 143 mmol/L (ref 135–145)
Total Bilirubin: 0.5 mg/dL (ref 0.3–1.2)
Total Protein: 6.3 g/dL — ABNORMAL LOW (ref 6.5–8.1)

## 2018-03-03 NOTE — Telephone Encounter (Signed)
Patient arrived late for his treatment 12/30. Had to reschedule his treatment from 12/30 and 12/31.  Printed patient a calendar of rescheduled appointments.

## 2018-03-04 ENCOUNTER — Ambulatory Visit: Payer: Medicare Other

## 2018-03-07 ENCOUNTER — Inpatient Hospital Stay: Payer: Medicare Other | Attending: Internal Medicine

## 2018-03-07 ENCOUNTER — Other Ambulatory Visit: Payer: Self-pay | Admitting: *Deleted

## 2018-03-07 ENCOUNTER — Other Ambulatory Visit: Payer: Self-pay | Admitting: Internal Medicine

## 2018-03-07 VITALS — BP 106/71 | HR 72 | Temp 97.5°F | Resp 16

## 2018-03-07 DIAGNOSIS — Z5112 Encounter for antineoplastic immunotherapy: Secondary | ICD-10-CM | POA: Diagnosis not present

## 2018-03-07 DIAGNOSIS — Z955 Presence of coronary angioplasty implant and graft: Secondary | ICD-10-CM | POA: Insufficient documentation

## 2018-03-07 DIAGNOSIS — K219 Gastro-esophageal reflux disease without esophagitis: Secondary | ICD-10-CM | POA: Insufficient documentation

## 2018-03-07 DIAGNOSIS — Z5111 Encounter for antineoplastic chemotherapy: Secondary | ICD-10-CM | POA: Insufficient documentation

## 2018-03-07 DIAGNOSIS — I252 Old myocardial infarction: Secondary | ICD-10-CM | POA: Insufficient documentation

## 2018-03-07 DIAGNOSIS — C8100 Nodular lymphocyte predominant Hodgkin lymphoma, unspecified site: Secondary | ICD-10-CM

## 2018-03-07 DIAGNOSIS — C8208 Follicular lymphoma grade I, lymph nodes of multiple sites: Secondary | ICD-10-CM | POA: Diagnosis not present

## 2018-03-07 MED ORDER — ACETAMINOPHEN 325 MG PO TABS
ORAL_TABLET | ORAL | Status: AC
  Filled 2018-03-07: qty 2

## 2018-03-07 MED ORDER — DIPHENHYDRAMINE HCL 25 MG PO CAPS
50.0000 mg | ORAL_CAPSULE | Freq: Once | ORAL | Status: AC
  Administered 2018-03-07: 50 mg via ORAL

## 2018-03-07 MED ORDER — SODIUM CHLORIDE 0.9 % IV SOLN
375.0000 mg/m2 | Freq: Once | INTRAVENOUS | Status: AC
  Administered 2018-03-07: 700 mg via INTRAVENOUS
  Filled 2018-03-07: qty 50

## 2018-03-07 MED ORDER — SODIUM CHLORIDE 0.9 % IV SOLN
Freq: Once | INTRAVENOUS | Status: AC
  Administered 2018-03-07: 08:00:00 via INTRAVENOUS
  Filled 2018-03-07: qty 250

## 2018-03-07 MED ORDER — PALONOSETRON HCL INJECTION 0.25 MG/5ML
0.2500 mg | Freq: Once | INTRAVENOUS | Status: AC
  Administered 2018-03-07: 0.25 mg via INTRAVENOUS

## 2018-03-07 MED ORDER — DEXAMETHASONE SODIUM PHOSPHATE 10 MG/ML IJ SOLN
10.0000 mg | Freq: Once | INTRAMUSCULAR | Status: AC
  Administered 2018-03-07: 10 mg via INTRAVENOUS

## 2018-03-07 MED ORDER — DEXAMETHASONE SODIUM PHOSPHATE 10 MG/ML IJ SOLN
INTRAMUSCULAR | Status: AC
  Filled 2018-03-07: qty 1

## 2018-03-07 MED ORDER — SODIUM CHLORIDE 0.9 % IV SOLN
90.0000 mg/m2 | Freq: Once | INTRAVENOUS | Status: AC
  Administered 2018-03-07: 175 mg via INTRAVENOUS
  Filled 2018-03-07: qty 7

## 2018-03-07 MED ORDER — ACETAMINOPHEN 325 MG PO TABS
650.0000 mg | ORAL_TABLET | Freq: Once | ORAL | Status: AC
  Administered 2018-03-07: 650 mg via ORAL

## 2018-03-07 MED ORDER — PALONOSETRON HCL INJECTION 0.25 MG/5ML
INTRAVENOUS | Status: AC
  Filled 2018-03-07: qty 5

## 2018-03-07 MED ORDER — SODIUM CHLORIDE 0.9 % IV SOLN: 375.0000 mg/m2 | Freq: Once | INTRAVENOUS | Status: DC

## 2018-03-07 MED ORDER — DIPHENHYDRAMINE HCL 25 MG PO CAPS
ORAL_CAPSULE | ORAL | Status: AC
  Filled 2018-03-07: qty 2

## 2018-03-07 NOTE — Patient Instructions (Signed)
[place] Discharge Instructions for Patients Receiving Chemotherapy  Today you received the following chemotherapy agents:  Rituxan & Bendeka  To help prevent nausea and vomiting after your treatment, we encourage you to take your nausea medication as prescribed.   If you develop nausea and vomiting that is not controlled by your nausea medication, call the clinic.   BELOW ARE SYMPTOMS THAT SHOULD BE REPORTED IMMEDIATELY:  *FEVER GREATER THAN 100.5 F  *CHILLS WITH OR WITHOUT FEVER  NAUSEA AND VOMITING THAT IS NOT CONTROLLED WITH YOUR NAUSEA MEDICATION  *UNUSUAL SHORTNESS OF BREATH  *UNUSUAL BRUISING OR BLEEDING  TENDERNESS IN MOUTH AND THROAT WITH OR WITHOUT PRESENCE OF ULCERS  *URINARY PROBLEMS  *BOWEL PROBLEMS  UNUSUAL RASH Items with * indicate a potential emergency and should be followed up as soon as possible.  Feel free to call the clinic should you have any questions or concerns. The clinic phone number is [phone].  Please show the [place] at check-in to the Emergency Department and triage nurse.

## 2018-03-08 ENCOUNTER — Inpatient Hospital Stay: Payer: Medicare Other

## 2018-03-08 VITALS — BP 124/74 | HR 68 | Temp 97.9°F | Resp 18

## 2018-03-08 DIAGNOSIS — C8208 Follicular lymphoma grade I, lymph nodes of multiple sites: Secondary | ICD-10-CM

## 2018-03-08 DIAGNOSIS — K219 Gastro-esophageal reflux disease without esophagitis: Secondary | ICD-10-CM | POA: Diagnosis not present

## 2018-03-08 DIAGNOSIS — C8100 Nodular lymphocyte predominant Hodgkin lymphoma, unspecified site: Secondary | ICD-10-CM

## 2018-03-08 DIAGNOSIS — Z955 Presence of coronary angioplasty implant and graft: Secondary | ICD-10-CM | POA: Diagnosis not present

## 2018-03-08 DIAGNOSIS — Z5111 Encounter for antineoplastic chemotherapy: Secondary | ICD-10-CM | POA: Diagnosis not present

## 2018-03-08 DIAGNOSIS — Z5112 Encounter for antineoplastic immunotherapy: Secondary | ICD-10-CM | POA: Diagnosis not present

## 2018-03-08 DIAGNOSIS — I252 Old myocardial infarction: Secondary | ICD-10-CM | POA: Diagnosis not present

## 2018-03-08 MED ORDER — DEXAMETHASONE SODIUM PHOSPHATE 10 MG/ML IJ SOLN
10.0000 mg | Freq: Once | INTRAMUSCULAR | Status: AC
  Administered 2018-03-08: 10 mg via INTRAVENOUS

## 2018-03-08 MED ORDER — DEXAMETHASONE SODIUM PHOSPHATE 10 MG/ML IJ SOLN
INTRAMUSCULAR | Status: AC
  Filled 2018-03-08: qty 1

## 2018-03-08 MED ORDER — HEPARIN SOD (PORK) LOCK FLUSH 100 UNIT/ML IV SOLN
500.0000 [IU] | Freq: Once | INTRAVENOUS | Status: DC | PRN
  Filled 2018-03-08: qty 5

## 2018-03-08 MED ORDER — SODIUM CHLORIDE 0.9 % IV SOLN
90.0000 mg/m2 | Freq: Once | INTRAVENOUS | Status: AC
  Administered 2018-03-08: 175 mg via INTRAVENOUS
  Filled 2018-03-08: qty 7

## 2018-03-08 MED ORDER — SODIUM CHLORIDE 0.9% FLUSH
10.0000 mL | INTRAVENOUS | Status: DC | PRN
  Filled 2018-03-08: qty 10

## 2018-03-08 MED ORDER — SODIUM CHLORIDE 0.9 % IV SOLN
Freq: Once | INTRAVENOUS | Status: AC
  Administered 2018-03-08: 12:00:00 via INTRAVENOUS
  Filled 2018-03-08: qty 250

## 2018-03-08 NOTE — Patient Instructions (Signed)
[place] Discharge Instructions for Patients Receiving Chemotherapy  Today you received the following chemotherapy agents :  Bendamustine.  To help prevent nausea and vomiting after your treatment, we encourage you to take your nausea medication as prescribed.   If you develop nausea and vomiting that is not controlled by your nausea medication, call the clinic.   BELOW ARE SYMPTOMS THAT SHOULD BE REPORTED IMMEDIATELY:  *FEVER GREATER THAN 100.5 F  *CHILLS WITH OR WITHOUT FEVER  NAUSEA AND VOMITING THAT IS NOT CONTROLLED WITH YOUR NAUSEA MEDICATION  *UNUSUAL SHORTNESS OF BREATH  *UNUSUAL BRUISING OR BLEEDING  TENDERNESS IN MOUTH AND THROAT WITH OR WITHOUT PRESENCE OF ULCERS  *URINARY PROBLEMS  *BOWEL PROBLEMS  UNUSUAL RASH Items with * indicate a potential emergency and should be followed up as soon as possible.  Feel free to call the clinic should you have any questions or concerns. The clinic phone number is [phone].  Please show the [place] at check-in to the Emergency Department and triage nurse.

## 2018-03-10 ENCOUNTER — Telehealth: Payer: Self-pay | Admitting: Medical Oncology

## 2018-03-10 NOTE — Telephone Encounter (Signed)
Pt.notified

## 2018-03-10 NOTE — Telephone Encounter (Signed)
-----   Message from Curt Bears, MD sent at 03/07/2018  3:59 PM EST ----- Regarding: RE: allopurinol No.  His disease is not bulky to do it but I am not against it if he wants to do it. ----- Message ----- From: Ardeen Garland, RN Sent: 03/07/2018   1:24 PM EST To: Curt Bears, MD Subject: allopurinol                                    Pt asking -Does he need to take allopurinol?

## 2018-03-17 ENCOUNTER — Inpatient Hospital Stay: Payer: Medicare Other

## 2018-03-17 DIAGNOSIS — C8208 Follicular lymphoma grade I, lymph nodes of multiple sites: Secondary | ICD-10-CM

## 2018-03-17 DIAGNOSIS — Z5111 Encounter for antineoplastic chemotherapy: Secondary | ICD-10-CM | POA: Diagnosis not present

## 2018-03-17 LAB — CBC WITH DIFFERENTIAL (CANCER CENTER ONLY)
Abs Immature Granulocytes: 0.09 10*3/uL — ABNORMAL HIGH (ref 0.00–0.07)
BASOS ABS: 0 10*3/uL (ref 0.0–0.1)
Basophils Relative: 2 %
Eosinophils Absolute: 0.3 10*3/uL (ref 0.0–0.5)
Eosinophils Relative: 13 %
HCT: 44.5 % (ref 39.0–52.0)
Hemoglobin: 14.6 g/dL (ref 13.0–17.0)
IMMATURE GRANULOCYTES: 5 %
LYMPHS ABS: 0.3 10*3/uL — AB (ref 0.7–4.0)
Lymphocytes Relative: 17 %
MCH: 29.7 pg (ref 26.0–34.0)
MCHC: 32.8 g/dL (ref 30.0–36.0)
MCV: 90.4 fL (ref 80.0–100.0)
Monocytes Absolute: 0.6 10*3/uL (ref 0.1–1.0)
Monocytes Relative: 31 %
NRBC: 0 % (ref 0.0–0.2)
Neutro Abs: 0.6 10*3/uL — ABNORMAL LOW (ref 1.7–7.7)
Neutrophils Relative %: 32 %
Platelet Count: 215 10*3/uL (ref 150–400)
RBC: 4.92 MIL/uL (ref 4.22–5.81)
RDW: 13.6 % (ref 11.5–15.5)
WBC Count: 1.9 10*3/uL — ABNORMAL LOW (ref 4.0–10.5)

## 2018-03-17 LAB — CMP (CANCER CENTER ONLY)
ALT: 18 U/L (ref 0–44)
AST: 19 U/L (ref 15–41)
Albumin: 3.6 g/dL (ref 3.5–5.0)
Alkaline Phosphatase: 125 U/L (ref 38–126)
Anion gap: 8 (ref 5–15)
BUN: 20 mg/dL (ref 8–23)
CO2: 27 mmol/L (ref 22–32)
Calcium: 8.9 mg/dL (ref 8.9–10.3)
Chloride: 106 mmol/L (ref 98–111)
Creatinine: 1.22 mg/dL (ref 0.61–1.24)
GFR, Est AFR Am: >60 mL/min (ref >60)
GFR, Estimated: 57 mL/min — ABNORMAL LOW (ref >60)
Glucose, Bld: 81 mg/dL (ref 70–99)
POTASSIUM: 4.4 mmol/L (ref 3.5–5.1)
Sodium: 141 mmol/L (ref 135–145)
TOTAL PROTEIN: 6.4 g/dL — AB (ref 6.5–8.1)
Total Bilirubin: 0.5 mg/dL (ref 0.3–1.2)

## 2018-03-31 ENCOUNTER — Inpatient Hospital Stay: Payer: Medicare Other

## 2018-03-31 ENCOUNTER — Inpatient Hospital Stay (HOSPITAL_BASED_OUTPATIENT_CLINIC_OR_DEPARTMENT_OTHER): Payer: Medicare Other | Admitting: Medical

## 2018-03-31 VITALS — BP 122/81 | HR 74 | Temp 98.5°F | Resp 17 | Ht 67.0 in | Wt 161.3 lb

## 2018-03-31 VITALS — BP 102/72 | HR 71 | Temp 97.9°F | Resp 18

## 2018-03-31 DIAGNOSIS — C8208 Follicular lymphoma grade I, lymph nodes of multiple sites: Secondary | ICD-10-CM | POA: Diagnosis not present

## 2018-03-31 DIAGNOSIS — I252 Old myocardial infarction: Secondary | ICD-10-CM

## 2018-03-31 DIAGNOSIS — Z5111 Encounter for antineoplastic chemotherapy: Secondary | ICD-10-CM | POA: Diagnosis not present

## 2018-03-31 DIAGNOSIS — K219 Gastro-esophageal reflux disease without esophagitis: Secondary | ICD-10-CM | POA: Diagnosis not present

## 2018-03-31 DIAGNOSIS — C8100 Nodular lymphocyte predominant Hodgkin lymphoma, unspecified site: Secondary | ICD-10-CM

## 2018-03-31 DIAGNOSIS — Z955 Presence of coronary angioplasty implant and graft: Secondary | ICD-10-CM

## 2018-03-31 DIAGNOSIS — Z5112 Encounter for antineoplastic immunotherapy: Secondary | ICD-10-CM | POA: Diagnosis not present

## 2018-03-31 LAB — CBC WITH DIFFERENTIAL (CANCER CENTER ONLY)
Abs Immature Granulocytes: 0.1 10*3/uL — ABNORMAL HIGH (ref 0.00–0.07)
Basophils Absolute: 0.1 10*3/uL (ref 0.0–0.1)
Basophils Relative: 2 %
Eosinophils Absolute: 0.3 10*3/uL (ref 0.0–0.5)
Eosinophils Relative: 8 %
HCT: 39.7 % (ref 39.0–52.0)
Hemoglobin: 13.3 g/dL (ref 13.0–17.0)
Immature Granulocytes: 3 %
Lymphocytes Relative: 15 %
Lymphs Abs: 0.6 10*3/uL — ABNORMAL LOW (ref 0.7–4.0)
MCH: 29.6 pg (ref 26.0–34.0)
MCHC: 33.5 g/dL (ref 30.0–36.0)
MCV: 88.4 fL (ref 80.0–100.0)
Monocytes Absolute: 0.9 10*3/uL (ref 0.1–1.0)
Monocytes Relative: 23 %
NEUTROS ABS: 2 10*3/uL (ref 1.7–7.7)
Neutrophils Relative %: 49 %
Platelet Count: 191 10*3/uL (ref 150–400)
RBC: 4.49 MIL/uL (ref 4.22–5.81)
RDW: 13.3 % (ref 11.5–15.5)
WBC Count: 3.9 10*3/uL — ABNORMAL LOW (ref 4.0–10.5)
nRBC: 0 % (ref 0.0–0.2)

## 2018-03-31 LAB — CMP (CANCER CENTER ONLY)
ALT: 18 U/L (ref 0–44)
AST: 22 U/L (ref 15–41)
Albumin: 3.5 g/dL (ref 3.5–5.0)
Alkaline Phosphatase: 149 U/L — ABNORMAL HIGH (ref 38–126)
Anion gap: 9 (ref 5–15)
BUN: 17 mg/dL (ref 8–23)
CO2: 26 mmol/L (ref 22–32)
Calcium: 8.8 mg/dL — ABNORMAL LOW (ref 8.9–10.3)
Chloride: 106 mmol/L (ref 98–111)
Creatinine: 1.16 mg/dL (ref 0.61–1.24)
GFR, Est AFR Am: >60 mL/min (ref >60)
GFR, Estimated: >60 mL/min (ref >60)
Glucose, Bld: 95 mg/dL (ref 70–99)
POTASSIUM: 4.2 mmol/L (ref 3.5–5.1)
Sodium: 141 mmol/L (ref 135–145)
TOTAL PROTEIN: 6.1 g/dL — AB (ref 6.5–8.1)
Total Bilirubin: 0.3 mg/dL (ref 0.3–1.2)

## 2018-03-31 MED ORDER — SODIUM CHLORIDE 0.9 % IV SOLN
90.0000 mg/m2 | Freq: Once | INTRAVENOUS | Status: AC
  Administered 2018-03-31: 175 mg via INTRAVENOUS
  Filled 2018-03-31: qty 7

## 2018-03-31 MED ORDER — ACETAMINOPHEN 325 MG PO TABS
650.0000 mg | ORAL_TABLET | Freq: Once | ORAL | Status: AC
  Administered 2018-03-31: 650 mg via ORAL

## 2018-03-31 MED ORDER — PALONOSETRON HCL INJECTION 0.25 MG/5ML
INTRAVENOUS | Status: AC
  Filled 2018-03-31: qty 5

## 2018-03-31 MED ORDER — ACETAMINOPHEN 325 MG PO TABS
ORAL_TABLET | ORAL | Status: AC
  Filled 2018-03-31: qty 2

## 2018-03-31 MED ORDER — SODIUM CHLORIDE 0.9 % IV SOLN
Freq: Once | INTRAVENOUS | Status: AC
  Administered 2018-03-31: 12:00:00 via INTRAVENOUS
  Filled 2018-03-31: qty 250

## 2018-03-31 MED ORDER — DIPHENHYDRAMINE HCL 25 MG PO CAPS
50.0000 mg | ORAL_CAPSULE | Freq: Once | ORAL | Status: AC
  Administered 2018-03-31: 50 mg via ORAL

## 2018-03-31 MED ORDER — DIPHENHYDRAMINE HCL 25 MG PO CAPS
ORAL_CAPSULE | ORAL | Status: AC
  Filled 2018-03-31: qty 2

## 2018-03-31 MED ORDER — DEXAMETHASONE SODIUM PHOSPHATE 10 MG/ML IJ SOLN
INTRAMUSCULAR | Status: AC
  Filled 2018-03-31: qty 1

## 2018-03-31 MED ORDER — SODIUM CHLORIDE 0.9 % IV SOLN
375.0000 mg/m2 | Freq: Once | INTRAVENOUS | Status: AC
  Administered 2018-03-31: 700 mg via INTRAVENOUS
  Filled 2018-03-31: qty 50

## 2018-03-31 MED ORDER — PALONOSETRON HCL INJECTION 0.25 MG/5ML
0.2500 mg | Freq: Once | INTRAVENOUS | Status: AC
  Administered 2018-03-31: 0.25 mg via INTRAVENOUS

## 2018-03-31 MED ORDER — DEXAMETHASONE SODIUM PHOSPHATE 10 MG/ML IJ SOLN
10.0000 mg | Freq: Once | INTRAMUSCULAR | Status: AC
  Administered 2018-03-31: 10 mg via INTRAVENOUS

## 2018-03-31 NOTE — Progress Notes (Signed)
[facility] Progress Note   Eric [name] [id number] 1940/05/24 78 y.o.  Eric [name] is managed by Dr. Eilleen Kempf  Actively treated with chemotherapy/immunotherapy/hormonal therapy: yes  Current Therapy: Bendeka & Rituxan  Last Treated: 03/08/2018 (Cycle 1 Day 2)  Assessment: Plan:    Grade 1 follicular lymphoma of lymph nodes of multiple regions ([place])  1) Bulky stage IV low-grade lymphoma: The patient presents for Cycle 2 Day 1 of Bendeka and Rituxan.  We will proceed with his chemotherapy today.  He will return for labs only on 04/14/2018 and will be seen in follow up on 04/28/2018.   Please see After Visit Summary for patient specific instructions.  Future Appointments  Date Time Provider Shell  04/14/2018 11:00 AM [facility] LAB 6 [facility] None  04/28/2018  8:45 AM [facility] ONLY [facility] None  04/28/2018  9:15 AM Curt Bears, MD [facility] None  04/28/2018 10:15 AM [facility] INFUSION [facility] None  04/29/2018  8:00 AM [facility] INFUSION [facility] None  05/12/2018 11:00 AM [facility] LAB 2 [facility] None    No orders of the defined types were placed in this encounter.      Subjective:   Patient ID:  Eric [name] is a 78 y.o. (DOB 22-Jan-1941) male.  Chief Complaint: No chief complaint on file.   HPI Eric [name] is a 78 y.o. male with a bulky Stage IV low grade lymphoma who is managed by Dr. Julien Nordmann.  He is s/p Cycle 1 Day 2 of Bendeka and Rituxan which was dosed on 03/08/2018.  He presents for cycle 2 Day 1 with no acute issues of concern.  We will proceed with his treatment today.  Medications: I have reviewed the patient's current medications.  Allergies:  Allergies  Allergen Reactions  . Neosporin [Neomycin-Bacitracin Zn-Polymyx] Anaphylaxis    Past Medical History:  Diagnosis Date  . Cancer ([place])   . Coronary atherosclerosis of native coronary artery   . Encounter for antineoplastic  chemotherapy 06/20/2015  . GERD (gastroesophageal reflux disease)   . History of heart attack   . Hyperlipidemia   . Myocardial infarction ([place])   . Non Hodgkin's lymphoma [facility])     Past Surgical History:  Procedure Laterality Date  . CORONARY STENT PLACEMENT    . LEFT HEART CATHETERIZATION WITH CORONARY ANGIOGRAM N/A 03/03/2012   Procedure: LEFT HEART CATHETERIZATION WITH CORONARY ANGIOGRAM;  Surgeon: [name], MD;  Location: [facility] CATH LAB;  Service: Cardiovascular;  Laterality: N/A;  . PERCUTANEOUS CORONARY STENT INTERVENTION (PCI-S) N/A 03/04/2012   Procedure: PERCUTANEOUS CORONARY STENT INTERVENTION (PCI-S);  Surgeon: [name], MD;  Location: [facility] CATH LAB;  Service: Cardiovascular;  Laterality: N/A;    Family History  Problem Relation Age of Onset  . Leukemia Mother     Social History   Socioeconomic History  . Marital status: Married    Spouse name: Not on file  . Number of children: Not on file  . Years of education: Not on file  . Highest education level: Not on file  Occupational History  . Occupation: Software engineer  Social Needs  . Financial resource strain: Not on file  . Food insecurity:    Worry: Not on file    Inability: Not on file  . Transportation needs:    Medical: Not on file    Non-medical: Not on file  Tobacco Use  . Smoking status: Never Smoker  . Smokeless tobacco: Never Used  Substance and Sexual Activity  . Alcohol use: Yes  Comment: occasionally  . Drug use: No  . Sexual activity: Yes  Lifestyle  . Physical activity:    Days per week: Not on file    Minutes per session: Not on file  . Stress: Not on file  Relationships  . Social connections:    Talks on phone: Not on file    Gets together: Not on file    Attends religious service: Not on file    Active member of club or organization: Not on file    Attends meetings of clubs or organizations: Not on file    Relationship status: Not on file  . Intimate partner violence:     Fear of current or ex partner: Not on file    Emotionally abused: Not on file    Physically abused: Not on file    Forced sexual activity: Not on file  Other Topics Concern  . 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       Tobacco: never      Alcohol:  Socially; almost none in 2017      Exercise in door track, cardio machine and treadmill 1-2 times per week      ADLs: independent with ADLs;       Advanced Directives: no; desires FULL CODE.     Past Medical History, Surgical history, Social history, and Family history were reviewed and updated as appropriate.   Please see review of systems for further details on the patient's review from today.   Review of Systems:  Review of Systems  Constitutional: Negative for chills, diaphoresis and fever.  HENT: Negative for trouble swallowing and voice change.   Respiratory: Negative for cough, chest tightness, shortness of breath and wheezing.   Cardiovascular: Negative for chest pain and palpitations.  Gastrointestinal: Negative for abdominal pain, constipation, diarrhea, nausea and vomiting.  Musculoskeletal: Negative for back pain and myalgias.  Neurological: Negative for dizziness, light-headedness and headaches.    Objective:   Physical Exam:  BP 122/81 (BP Location: Left Arm, Patient Position: Sitting)   Pulse 74   Temp 98.5 F (36.9 C) (Oral)   Resp 17   Ht 5\' 7"  (1.702 m)   Wt 161 lb 4.8 oz (73.2 kg)   SpO2 100%   BMI 25.26 kg/m  ECOG: 0  Physical Exam Constitutional:      General: He is not in acute distress.    Appearance: He is not diaphoretic.  HENT:     Head: Normocephalic and atraumatic.     Right Ear: External ear normal.      Left Ear: External ear normal.     Mouth/Throat:     Pharynx: No oropharyngeal exudate.  Neck:     Musculoskeletal: Normal range of motion and neck supple.  Cardiovascular:     Rate and Rhythm: Normal rate and regular rhythm.     Heart sounds: Normal heart sounds. No murmur. No friction rub. No gallop.   Pulmonary:     Effort: Pulmonary effort is normal. No respiratory distress.     Breath sounds: Normal breath sounds. No wheezing or rales.  Abdominal:     General: Bowel sounds are normal. There is  no distension.     Tenderness: There is no abdominal tenderness. There is no guarding.  Lymphadenopathy:     Cervical: No cervical adenopathy.     Upper Body:     Right upper body: No supraclavicular or axillary adenopathy.     Left upper body: No supraclavicular or axillary adenopathy.  Skin:    General: Skin is warm and dry.     Findings: No erythema or rash.  Neurological:     Mental Status: He is alert.     Coordination: Coordination normal.     Gait: Gait normal.  Psychiatric:        Behavior: Behavior normal.        Thought Content: Thought content normal.        Judgment: Judgment normal.     Lab Review:     Component Value Date/Time   NA 141 03/31/2018 1008   NA 140 12/09/2017 0859   NA 140 02/14/2017 0804   K 4.2 03/31/2018 1008   K 4.4 02/14/2017 0804   CL 106 03/31/2018 1008   CL 106 08/06/2012 1244   CO2 26 03/31/2018 1008   CO2 22 02/14/2017 0804   GLUCOSE 95 03/31/2018 1008   GLUCOSE 89 02/14/2017 0804   GLUCOSE 77 08/06/2012 1244   BUN 17 03/31/2018 1008   BUN 14 12/09/2017 0859   BUN 14.8 02/14/2017 0804   CREATININE 1.16 03/31/2018 1008   CREATININE 1.1 02/14/2017 0804   CALCIUM 8.8 (L) 03/31/2018 1008   CALCIUM 9.2 02/14/2017 0804   PROT 6.1 (L) 03/31/2018 1008   PROT 6.1 12/09/2017 0859   PROT 6.4 02/14/2017 0804   ALBUMIN 3.5 03/31/2018 1008   ALBUMIN 4.3 12/09/2017 0859   ALBUMIN 3.9 02/14/2017 0804   AST 22 03/31/2018 1008   AST 29  02/14/2017 0804   ALT 18 03/31/2018 1008   ALT 27 02/14/2017 0804   ALKPHOS 149 (H) 03/31/2018 1008   ALKPHOS 138 02/14/2017 0804   BILITOT 0.3 03/31/2018 1008   BILITOT 0.61 02/14/2017 0804   GFRNONAA >60 03/31/2018 1008   GFRAA >60 03/31/2018 1008       Component Value Date/Time   WBC 3.9 (L) 03/31/2018 1008   WBC 4.2 08/22/2017 0813   RBC 4.49 03/31/2018 1008   HGB 13.3 03/31/2018 1008   HGB 14.6 02/14/2017 0804   HCT 39.7 03/31/2018 1008   HCT 43.3 02/14/2017 0804   PLT 191 03/31/2018 1008   PLT 221 02/14/2017 0804   PLT 326 02/29/2016 1613   MCV 88.4 03/31/2018 1008   MCV 88.9 02/14/2017 0804   MCH 29.6 03/31/2018 1008   MCHC 33.5 03/31/2018 1008   RDW 13.3 03/31/2018 1008   RDW 13.8 02/14/2017 0804   LYMPHSABS 0.6 (L) 03/31/2018 1008   LYMPHSABS 1.3 02/14/2017 0804   MONOABS 0.9 03/31/2018 1008   MONOABS 0.8 02/14/2017 0804   EOSABS 0.3 03/31/2018 1008   EOSABS 0.4 02/14/2017 0804   EOSABS 0.3 02/29/2016 1613   BASOSABS 0.1 03/31/2018 1008   BASOSABS 0.0 02/14/2017 0804   -------------------------------  Imaging from last 24 hours (if applicable):  Radiology interpretation: No results found.   This case was discussed with Dr. Julien Nordmann. He expressed agreement with my management of this patient.     OK to treat. [name], PA-C

## 2018-03-31 NOTE — Progress Notes (Signed)
Pt seen by PA Van only, no RN assessment at this time.  PA aware. 

## 2018-03-31 NOTE — Patient Instructions (Signed)
[facility] Discharge Instructions for Patients Receiving Chemotherapy  Today you received the following chemotherapy agents:  Rituxan & Bendeka  To help prevent nausea and vomiting after your treatment, we encourage you to take your nausea medication as prescribed.   If you develop nausea and vomiting that is not controlled by your nausea medication, call the clinic.   BELOW ARE SYMPTOMS THAT SHOULD BE REPORTED IMMEDIATELY:  *FEVER GREATER THAN 100.5 F  *CHILLS WITH OR WITHOUT FEVER  NAUSEA AND VOMITING THAT IS NOT CONTROLLED WITH YOUR NAUSEA MEDICATION  *UNUSUAL SHORTNESS OF BREATH  *UNUSUAL BRUISING OR BLEEDING  TENDERNESS IN MOUTH AND THROAT WITH OR WITHOUT PRESENCE OF ULCERS  *URINARY PROBLEMS  *BOWEL PROBLEMS  UNUSUAL RASH Items with * indicate a potential emergency and should be followed up as soon as possible.  Feel free to call the clinic should you have any questions or concerns. The clinic phone number is [phone].  Please show the [facility] at check-in to the Emergency Department and triage nurse.

## 2018-04-01 ENCOUNTER — Inpatient Hospital Stay: Payer: Medicare Other

## 2018-04-01 VITALS — BP 137/73 | HR 79 | Temp 98.1°F | Resp 18

## 2018-04-01 DIAGNOSIS — Z5111 Encounter for antineoplastic chemotherapy: Secondary | ICD-10-CM | POA: Diagnosis not present

## 2018-04-01 DIAGNOSIS — I252 Old myocardial infarction: Secondary | ICD-10-CM | POA: Diagnosis not present

## 2018-04-01 DIAGNOSIS — C8100 Nodular lymphocyte predominant Hodgkin lymphoma, unspecified site: Secondary | ICD-10-CM

## 2018-04-01 DIAGNOSIS — C8208 Follicular lymphoma grade I, lymph nodes of multiple sites: Secondary | ICD-10-CM | POA: Diagnosis not present

## 2018-04-01 DIAGNOSIS — K219 Gastro-esophageal reflux disease without esophagitis: Secondary | ICD-10-CM | POA: Diagnosis not present

## 2018-04-01 DIAGNOSIS — Z5112 Encounter for antineoplastic immunotherapy: Secondary | ICD-10-CM | POA: Diagnosis not present

## 2018-04-01 DIAGNOSIS — Z955 Presence of coronary angioplasty implant and graft: Secondary | ICD-10-CM | POA: Diagnosis not present

## 2018-04-01 MED ORDER — SODIUM CHLORIDE 0.9 % IV SOLN
Freq: Once | INTRAVENOUS | Status: AC
  Administered 2018-04-01: 15:00:00 via INTRAVENOUS
  Filled 2018-04-01: qty 250

## 2018-04-01 MED ORDER — SODIUM CHLORIDE 0.9 % IV SOLN
90.0000 mg/m2 | Freq: Once | INTRAVENOUS | Status: AC
  Administered 2018-04-01: 175 mg via INTRAVENOUS
  Filled 2018-04-01: qty 7

## 2018-04-01 MED ORDER — DEXAMETHASONE SODIUM PHOSPHATE 10 MG/ML IJ SOLN
10.0000 mg | Freq: Once | INTRAMUSCULAR | Status: AC
  Administered 2018-04-01: 10 mg via INTRAVENOUS

## 2018-04-01 MED ORDER — DEXAMETHASONE SODIUM PHOSPHATE 10 MG/ML IJ SOLN
INTRAMUSCULAR | Status: AC
  Filled 2018-04-01: qty 1

## 2018-04-01 NOTE — Patient Instructions (Signed)
[facility] Discharge Instructions for Patients Receiving Chemotherapy  Today you received the following chemotherapy agents Bendeka.  To help prevent nausea and vomiting after your treatment, we encourage you to take your nausea medication as directed.   If you develop nausea and vomiting that is not controlled by your nausea medication, call the clinic.   BELOW ARE SYMPTOMS THAT SHOULD BE REPORTED IMMEDIATELY:  *FEVER GREATER THAN 100.5 F  *CHILLS WITH OR WITHOUT FEVER  NAUSEA AND VOMITING THAT IS NOT CONTROLLED WITH YOUR NAUSEA MEDICATION  *UNUSUAL SHORTNESS OF BREATH  *UNUSUAL BRUISING OR BLEEDING  TENDERNESS IN MOUTH AND THROAT WITH OR WITHOUT PRESENCE OF ULCERS  *URINARY PROBLEMS  *BOWEL PROBLEMS  UNUSUAL RASH Items with * indicate a potential emergency and should be followed up as soon as possible.  Feel free to call the clinic should you have any questions or concerns. The clinic phone number is [phone].  Please show the CHEMO ALERT CARD at check-in to the Emergency Department and triage nurse.   

## 2018-04-10 ENCOUNTER — Other Ambulatory Visit: Payer: Medicare Other

## 2018-04-10 ENCOUNTER — Ambulatory Visit: Payer: Medicare Other | Admitting: Internal Medicine

## 2018-04-10 ENCOUNTER — Ambulatory Visit: Payer: Medicare Other

## 2018-04-14 ENCOUNTER — Telehealth: Payer: Self-pay | Admitting: *Deleted

## 2018-04-14 ENCOUNTER — Inpatient Hospital Stay: Payer: Medicare Other | Attending: Internal Medicine

## 2018-04-14 DIAGNOSIS — C8208 Follicular lymphoma grade I, lymph nodes of multiple sites: Secondary | ICD-10-CM | POA: Insufficient documentation

## 2018-04-14 DIAGNOSIS — Z7982 Long term (current) use of aspirin: Secondary | ICD-10-CM | POA: Insufficient documentation

## 2018-04-14 DIAGNOSIS — Z5112 Encounter for antineoplastic immunotherapy: Secondary | ICD-10-CM | POA: Diagnosis present

## 2018-04-14 DIAGNOSIS — K219 Gastro-esophageal reflux disease without esophagitis: Secondary | ICD-10-CM | POA: Insufficient documentation

## 2018-04-14 DIAGNOSIS — E785 Hyperlipidemia, unspecified: Secondary | ICD-10-CM | POA: Insufficient documentation

## 2018-04-14 DIAGNOSIS — I252 Old myocardial infarction: Secondary | ICD-10-CM | POA: Diagnosis not present

## 2018-04-14 DIAGNOSIS — Z5111 Encounter for antineoplastic chemotherapy: Secondary | ICD-10-CM | POA: Diagnosis not present

## 2018-04-14 DIAGNOSIS — Z955 Presence of coronary angioplasty implant and graft: Secondary | ICD-10-CM | POA: Diagnosis not present

## 2018-04-14 DIAGNOSIS — Z79899 Other long term (current) drug therapy: Secondary | ICD-10-CM | POA: Diagnosis not present

## 2018-04-14 LAB — CBC WITH DIFFERENTIAL (CANCER CENTER ONLY)
Abs Immature Granulocytes: 0.02 10*3/uL (ref 0.00–0.07)
Basophils Absolute: 0 10*3/uL (ref 0.0–0.1)
Basophils Relative: 2 %
Eosinophils Absolute: 0.2 10*3/uL (ref 0.0–0.5)
Eosinophils Relative: 13 %
HCT: 38.5 % — ABNORMAL LOW (ref 39.0–52.0)
Hemoglobin: 12.7 g/dL — ABNORMAL LOW (ref 13.0–17.0)
Immature Granulocytes: 1 %
LYMPHS ABS: 0.6 10*3/uL — AB (ref 0.7–4.0)
LYMPHS PCT: 40 %
MCH: 29.6 pg (ref 26.0–34.0)
MCHC: 33 g/dL (ref 30.0–36.0)
MCV: 89.7 fL (ref 80.0–100.0)
Monocytes Absolute: 0.5 10*3/uL (ref 0.1–1.0)
Monocytes Relative: 37 %
Neutro Abs: 0.1 10*3/uL — CL (ref 1.7–7.7)
Neutrophils Relative %: 7 %
Platelet Count: 226 10*3/uL (ref 150–400)
RBC: 4.29 MIL/uL (ref 4.22–5.81)
RDW: 13.5 % (ref 11.5–15.5)
WBC Count: 1.5 10*3/uL — ABNORMAL LOW (ref 4.0–10.5)
nRBC: 0 % (ref 0.0–0.2)

## 2018-04-14 LAB — CMP (CANCER CENTER ONLY)
ALT: 25 U/L (ref 0–44)
AST: 32 U/L (ref 15–41)
Albumin: 3.3 g/dL — ABNORMAL LOW (ref 3.5–5.0)
Alkaline Phosphatase: 140 U/L — ABNORMAL HIGH (ref 38–126)
Anion gap: 9 (ref 5–15)
BUN: 9 mg/dL (ref 8–23)
CO2: 26 mmol/L (ref 22–32)
Calcium: 8.7 mg/dL — ABNORMAL LOW (ref 8.9–10.3)
Chloride: 107 mmol/L (ref 98–111)
Creatinine: 1.08 mg/dL (ref 0.61–1.24)
GFR, Est AFR Am: >60 mL/min (ref >60)
GFR, Estimated: >60 mL/min (ref >60)
Glucose, Bld: 76 mg/dL (ref 70–99)
Potassium: 3.7 mmol/L (ref 3.5–5.1)
SODIUM: 142 mmol/L (ref 135–145)
Total Bilirubin: 0.7 mg/dL (ref 0.3–1.2)
Total Protein: 6 g/dL — ABNORMAL LOW (ref 6.5–8.1)

## 2018-04-14 NOTE — Telephone Encounter (Signed)
Notified pt of lab results/neutropenic precautions.

## 2018-04-14 NOTE — Telephone Encounter (Signed)
Received call from lab-pt's ANC is 0.1  Dr. Worthy Flank nurse notified

## 2018-04-25 ENCOUNTER — Telehealth: Payer: Self-pay | Admitting: Internal Medicine

## 2018-04-25 NOTE — Telephone Encounter (Signed)
Called patient per 2/21 sch message - unable to reach patient - left message for patient to call back for r/s

## 2018-04-28 ENCOUNTER — Inpatient Hospital Stay: Payer: Medicare Other

## 2018-04-28 ENCOUNTER — Inpatient Hospital Stay: Payer: Medicare Other | Admitting: Internal Medicine

## 2018-04-29 ENCOUNTER — Inpatient Hospital Stay: Payer: Medicare Other

## 2018-04-29 ENCOUNTER — Telehealth: Payer: Self-pay | Admitting: Internal Medicine

## 2018-04-29 NOTE — Telephone Encounter (Signed)
R/s appt per 2/24 sch message - pt is aware of appt date and time

## 2018-04-30 ENCOUNTER — Inpatient Hospital Stay: Payer: Medicare Other

## 2018-04-30 ENCOUNTER — Encounter: Payer: Self-pay | Admitting: Internal Medicine

## 2018-04-30 ENCOUNTER — Telehealth: Payer: Self-pay | Admitting: Internal Medicine

## 2018-04-30 ENCOUNTER — Inpatient Hospital Stay (HOSPITAL_BASED_OUTPATIENT_CLINIC_OR_DEPARTMENT_OTHER): Payer: Medicare Other | Admitting: Internal Medicine

## 2018-04-30 VITALS — BP 108/67 | HR 74 | Temp 98.2°F | Resp 18

## 2018-04-30 VITALS — BP 137/84 | HR 77 | Temp 98.0°F | Resp 20 | Ht 67.0 in | Wt 160.7 lb

## 2018-04-30 DIAGNOSIS — Z79899 Other long term (current) drug therapy: Secondary | ICD-10-CM | POA: Diagnosis not present

## 2018-04-30 DIAGNOSIS — K219 Gastro-esophageal reflux disease without esophagitis: Secondary | ICD-10-CM

## 2018-04-30 DIAGNOSIS — C8208 Follicular lymphoma grade I, lymph nodes of multiple sites: Secondary | ICD-10-CM

## 2018-04-30 DIAGNOSIS — Z955 Presence of coronary angioplasty implant and graft: Secondary | ICD-10-CM | POA: Diagnosis not present

## 2018-04-30 DIAGNOSIS — I252 Old myocardial infarction: Secondary | ICD-10-CM

## 2018-04-30 DIAGNOSIS — E785 Hyperlipidemia, unspecified: Secondary | ICD-10-CM

## 2018-04-30 DIAGNOSIS — Z7982 Long term (current) use of aspirin: Secondary | ICD-10-CM

## 2018-04-30 DIAGNOSIS — Z5111 Encounter for antineoplastic chemotherapy: Secondary | ICD-10-CM

## 2018-04-30 DIAGNOSIS — C8100 Nodular lymphocyte predominant Hodgkin lymphoma, unspecified site: Secondary | ICD-10-CM

## 2018-04-30 DIAGNOSIS — Z5112 Encounter for antineoplastic immunotherapy: Secondary | ICD-10-CM | POA: Diagnosis not present

## 2018-04-30 LAB — CBC WITH DIFFERENTIAL (CANCER CENTER ONLY)
Abs Immature Granulocytes: 0.05 10*3/uL (ref 0.00–0.07)
Basophils Absolute: 0.1 10*3/uL (ref 0.0–0.1)
Basophils Relative: 1 %
Eosinophils Absolute: 0.5 10*3/uL (ref 0.0–0.5)
Eosinophils Relative: 11 %
HCT: 37.6 % — ABNORMAL LOW (ref 39.0–52.0)
HEMOGLOBIN: 12.5 g/dL — AB (ref 13.0–17.0)
Immature Granulocytes: 1 %
LYMPHS PCT: 19 %
Lymphs Abs: 0.9 10*3/uL (ref 0.7–4.0)
MCH: 29.2 pg (ref 26.0–34.0)
MCHC: 33.2 g/dL (ref 30.0–36.0)
MCV: 87.9 fL (ref 80.0–100.0)
Monocytes Absolute: 1 10*3/uL (ref 0.1–1.0)
Monocytes Relative: 21 %
Neutro Abs: 2.3 10*3/uL (ref 1.7–7.7)
Neutrophils Relative %: 47 %
PLATELETS: 206 10*3/uL (ref 150–400)
RBC: 4.28 MIL/uL (ref 4.22–5.81)
RDW: 14.1 % (ref 11.5–15.5)
WBC Count: 4.8 10*3/uL (ref 4.0–10.5)
nRBC: 0 % (ref 0.0–0.2)

## 2018-04-30 LAB — CMP (CANCER CENTER ONLY)
ALT: 42 U/L (ref 0–44)
AST: 47 U/L — AB (ref 15–41)
Albumin: 3.3 g/dL — ABNORMAL LOW (ref 3.5–5.0)
Alkaline Phosphatase: 157 U/L — ABNORMAL HIGH (ref 38–126)
Anion gap: 8 (ref 5–15)
BUN: 14 mg/dL (ref 8–23)
CO2: 26 mmol/L (ref 22–32)
Calcium: 8.8 mg/dL — ABNORMAL LOW (ref 8.9–10.3)
Chloride: 107 mmol/L (ref 98–111)
Creatinine: 1.03 mg/dL (ref 0.61–1.24)
GFR, Est AFR Am: >60 mL/min (ref >60)
GFR, Estimated: >60 mL/min (ref >60)
Glucose, Bld: 93 mg/dL (ref 70–99)
Potassium: 3.7 mmol/L (ref 3.5–5.1)
Sodium: 141 mmol/L (ref 135–145)
Total Bilirubin: 0.5 mg/dL (ref 0.3–1.2)
Total Protein: 5.7 g/dL — ABNORMAL LOW (ref 6.5–8.1)

## 2018-04-30 MED ORDER — ACETAMINOPHEN 325 MG PO TABS
650.0000 mg | ORAL_TABLET | Freq: Once | ORAL | Status: AC
  Administered 2018-04-30: 650 mg via ORAL

## 2018-04-30 MED ORDER — SODIUM CHLORIDE 0.9 % IV SOLN
90.0000 mg/m2 | Freq: Once | INTRAVENOUS | Status: AC
  Administered 2018-04-30: 175 mg via INTRAVENOUS
  Filled 2018-04-30: qty 7

## 2018-04-30 MED ORDER — ACETAMINOPHEN 325 MG PO TABS
ORAL_TABLET | ORAL | Status: AC
  Filled 2018-04-30: qty 2

## 2018-04-30 MED ORDER — PALONOSETRON HCL INJECTION 0.25 MG/5ML
0.2500 mg | Freq: Once | INTRAVENOUS | Status: AC
  Administered 2018-04-30: 0.25 mg via INTRAVENOUS

## 2018-04-30 MED ORDER — SODIUM CHLORIDE 0.9 % IV SOLN
Freq: Once | INTRAVENOUS | Status: AC
  Administered 2018-04-30: 10:00:00 via INTRAVENOUS
  Filled 2018-04-30: qty 250

## 2018-04-30 MED ORDER — DEXAMETHASONE SODIUM PHOSPHATE 10 MG/ML IJ SOLN
INTRAMUSCULAR | Status: AC
  Filled 2018-04-30: qty 1

## 2018-04-30 MED ORDER — DIPHENHYDRAMINE HCL 25 MG PO CAPS
ORAL_CAPSULE | ORAL | Status: AC
  Filled 2018-04-30: qty 2

## 2018-04-30 MED ORDER — DEXAMETHASONE SODIUM PHOSPHATE 10 MG/ML IJ SOLN
10.0000 mg | Freq: Once | INTRAMUSCULAR | Status: AC
  Administered 2018-04-30: 10 mg via INTRAVENOUS

## 2018-04-30 MED ORDER — SODIUM CHLORIDE 0.9 % IV SOLN
375.0000 mg/m2 | Freq: Once | INTRAVENOUS | Status: AC
  Administered 2018-04-30: 700 mg via INTRAVENOUS
  Filled 2018-04-30: qty 20

## 2018-04-30 MED ORDER — DIPHENHYDRAMINE HCL 25 MG PO CAPS
50.0000 mg | ORAL_CAPSULE | Freq: Once | ORAL | Status: AC
  Administered 2018-04-30: 50 mg via ORAL

## 2018-04-30 MED ORDER — PALONOSETRON HCL INJECTION 0.25 MG/5ML
INTRAVENOUS | Status: AC
  Filled 2018-04-30: qty 5

## 2018-04-30 NOTE — Telephone Encounter (Signed)
Scheduled appt per 2/26 los - pt to get an updated schedule next visit. 2/27 after treatment.

## 2018-04-30 NOTE — Progress Notes (Signed)
[facility] [phone]   [phone]  OFFICE PROGRESS NOTE  DIAGNOSIS: Bulky stage IV low-grade lymphoma diagnosed in November 2009.  PRIOR THERAPY: 1. Status post 7 cycles of systemic chemotherapy with CHOP/Rituxan. Last dose was given May 2010. 2. Status post 6 cycles of maintenance Rituxan 375 mg/sq m given every 2 months. Last dose was given on May 29, 2010, and the patient was lost to followup at that time. He received another dose on 12/11/2010, then again missed 2 doses. 3. systemic chemotherapy with Rituxan 375 mg/M2 on day 1 and that bendamustine 90 mg/M2 on days 1 and 2 every 4 weeks, status post 6 cycles. 4. Maintenance Rituxan 375 mg/M2 every 2 months, status post 12 cycles  5. Rituxan 375 MG/M2 given weekly for 4 weeks and this is followed by maintenance treatment every 2 months, status post 22 cycles discontinued secondary to disease progression.  CURRENT THERAPY: Systemic chemotherapy with bendamustine 90 mg/M2 on days 1 and 2 and Rituxan 375 mg/M2 on day 1 every 4 weeks.  First dose March 03, 2018.  Status post 2 cycles.  INTERVAL HISTORY: Eric [name] 78 y.o. male returns to the clinic today for follow-up visit.  He denied having any chest pain, shortness of breath, cough or hemoptysis.  He denied having any nausea, vomiting, diarrhea or constipation.  He has no weight loss or night sweats.  He continues to tolerate his treatment with bendamustine and Rituxan fairly well.  The patient is here today for evaluation before starting cycle #3.   MEDICAL HISTORY: Past Medical History:  Diagnosis Date  . Cancer ([place])   . Coronary atherosclerosis of native coronary artery   . Encounter for antineoplastic chemotherapy 06/20/2015  . GERD (gastroesophageal reflux disease)   . History of heart attack   . Hyperlipidemia   . Myocardial infarction ([place])   . Non Hodgkin's lymphoma ([place])     ALLERGIES:  is allergic to neosporin  [neomycin-bacitracin zn-polymyx].  MEDICATIONS:  Current Outpatient Medications  Medication Sig Dispense Refill  . acetaminophen (TYLENOL) 325 MG tablet Take by mouth.    Marland Kitchen aspirin EC 81 MG tablet Take 81 mg by mouth at bedtime.    Marland Kitchen atorvastatin (LIPITOR) 40 MG tablet Take 1 tablet (40 mg total) by mouth daily at 6 PM. 90 tablet 3  . diphenhydrAMINE (BENADRYL) 25 MG tablet Take 25 mg by mouth at bedtime as needed for sleep.     Marland Kitchen omeprazole (PRILOSEC) 20 MG capsule Take 20 mg by mouth daily as needed (acid relfux).    . Polyethylene Glycol 3350 (PEG 3350) POWD Take by mouth.    . prochlorperazine (COMPAZINE) 10 MG tablet Take 1 tablet (10 mg total) by mouth every 6 (six) hours as needed for nausea or vomiting. 30 tablet 0  . sildenafil (VIAGRA) 100 MG tablet Take 100 mg by mouth daily as needed.  3  . sildenafil (VIAGRA) 25 MG tablet Take 1 tablet (25 mg total) by mouth daily as needed. Take 2-3 tablets by mouth once daily as needed. 90 tablet 6  . traMADol (ULTRAM) 50 MG tablet Take by mouth.     No current facility-administered medications for this visit.     SURGICAL HISTORY:  Past Surgical History:  Procedure Laterality Date  . CORONARY STENT PLACEMENT    . LEFT HEART CATHETERIZATION WITH CORONARY ANGIOGRAM N/A 03/03/2012   Procedure: LEFT HEART CATHETERIZATION WITH CORONARY ANGIOGRAM;  Surgeon: [name], MD;  Location: [facility]  CATH LAB;  Service: Cardiovascular;  Laterality: N/A;  . PERCUTANEOUS CORONARY STENT INTERVENTION (PCI-S) N/A 03/04/2012   Procedure: PERCUTANEOUS CORONARY STENT INTERVENTION (PCI-S);  Surgeon: [name], MD;  Location: [facility] CATH LAB;  Service: Cardiovascular;  Laterality: N/A;    REVIEW OF SYSTEMS:  A comprehensive review of systems was negative.   PHYSICAL EXAMINATION: General appearance: alert, cooperative and no distress Head: Normocephalic, without obvious abnormality, atraumatic Neck: no adenopathy, no JVD, supple, symmetrical, trachea midline  and thyroid not enlarged, symmetric, no tenderness/mass/nodules Lymph nodes: Cervical, supraclavicular, and axillary nodes normal. Resp: clear to auscultation bilaterally Back: symmetric, no curvature. ROM normal. No CVA tenderness. Cardio: regular rate and rhythm, S1, S2 normal, no murmur, click, rub or gallop GI: soft, non-tender; bowel sounds normal; no masses,  no organomegaly Extremities: extremities normal, atraumatic, no cyanosis or edema  ECOG PERFORMANCE STATUS: 0 - Asymptomatic  Blood pressure 137/84, pulse 77, temperature 98 F (36.7 C), temperature source Oral, resp. rate 20, height 5\' 7"  (1.702 m), weight 160 lb 11.2 oz (72.9 kg), SpO2 100 %.  LABORATORY DATA: Lab Results  Component Value Date   WBC 4.8 04/30/2018   HGB 12.5 (L) 04/30/2018   HCT 37.6 (L) 04/30/2018   MCV 87.9 04/30/2018   PLT 206 04/30/2018      Chemistry      Component Value Date/Time   NA 142 04/14/2018 1055   NA 140 12/09/2017 0859   NA 140 02/14/2017 0804   K 3.7 04/14/2018 1055   K 4.4 02/14/2017 0804   CL 107 04/14/2018 1055   CL 106 08/06/2012 1244   CO2 26 04/14/2018 1055   CO2 22 02/14/2017 0804   BUN 9 04/14/2018 1055   BUN 14 12/09/2017 0859   BUN 14.8 02/14/2017 0804   CREATININE 1.08 04/14/2018 1055   CREATININE 1.1 02/14/2017 0804      Component Value Date/Time   CALCIUM 8.7 (L) 04/14/2018 1055   CALCIUM 9.2 02/14/2017 0804   ALKPHOS 140 (H) 04/14/2018 1055   ALKPHOS 138 02/14/2017 0804   AST 32 04/14/2018 1055   AST 29 02/14/2017 0804   ALT 25 04/14/2018 1055   ALT 27 02/14/2017 0804   BILITOT 0.7 04/14/2018 1055   BILITOT 0.61 02/14/2017 0804       RADIOGRAPHIC STUDIES: No results found.  ASSESSMENT AND PLAN:  This is a very pleasant 78 years old African-American male with bulky stage IV follicular lymphoma initially treated with CHOP/Rituxan followed by maintenance Rituxan. The patient had disease recurrence and he was restarted again on treatment with Rituxan  every 2 months status post 22 cycles. The patient has been tolerating this treatment well with no concerning adverse effects. Repeat scan of the chest, abdomen and pelvis performed 2 months ago showed progressive retroperitoneal and mesenteric lymphadenopathy consistent with progressive lymphoma. The patient was started on treatment with bendamustine and Rituxan status post 2 cycles.  He tolerated the first 2 cycles of his treatment well. I recommended for him to proceed with cycle #3 today as scheduled. I will see him back for follow-up visit in 4 weeks for evaluation before starting cycle #4. The patient was advised to call immediately if he has any concerning symptoms in the interval. The patient voices understanding of current disease status and treatment options and is in agreement with the current care plan. All questions were answered. The patient knows to call the clinic with any problems, questions or concerns. We can certainly see the patient much  sooner if necessary.   Disclaimer: This note was dictated with voice recognition software. Similar sounding words can inadvertently be transcribed and may not be corrected upon review.

## 2018-04-30 NOTE — Patient Instructions (Signed)
[facility] Discharge Instructions for Patients Receiving Chemotherapy  Today you received the following chemotherapy agents:  Rituxan & Bendeka  To help prevent nausea and vomiting after your treatment, we encourage you to take your nausea medication as prescribed.   If you develop nausea and vomiting that is not controlled by your nausea medication, call the clinic.   BELOW ARE SYMPTOMS THAT SHOULD BE REPORTED IMMEDIATELY:  *FEVER GREATER THAN 100.5 F  *CHILLS WITH OR WITHOUT FEVER  NAUSEA AND VOMITING THAT IS NOT CONTROLLED WITH YOUR NAUSEA MEDICATION  *UNUSUAL SHORTNESS OF BREATH  *UNUSUAL BRUISING OR BLEEDING  TENDERNESS IN MOUTH AND THROAT WITH OR WITHOUT PRESENCE OF ULCERS  *URINARY PROBLEMS  *BOWEL PROBLEMS  UNUSUAL RASH Items with * indicate a potential emergency and should be followed up as soon as possible.  Feel free to call the clinic should you have any questions or concerns. The clinic phone number is [phone].  Please show the [facility] at check-in to the Emergency Department and triage nurse.

## 2018-05-01 ENCOUNTER — Inpatient Hospital Stay: Payer: Medicare Other

## 2018-05-01 VITALS — BP 126/85 | HR 68 | Temp 97.9°F | Resp 18

## 2018-05-01 DIAGNOSIS — Z955 Presence of coronary angioplasty implant and graft: Secondary | ICD-10-CM | POA: Diagnosis not present

## 2018-05-01 DIAGNOSIS — C8100 Nodular lymphocyte predominant Hodgkin lymphoma, unspecified site: Secondary | ICD-10-CM

## 2018-05-01 DIAGNOSIS — I252 Old myocardial infarction: Secondary | ICD-10-CM | POA: Diagnosis not present

## 2018-05-01 DIAGNOSIS — Z79899 Other long term (current) drug therapy: Secondary | ICD-10-CM | POA: Diagnosis not present

## 2018-05-01 DIAGNOSIS — Z5112 Encounter for antineoplastic immunotherapy: Secondary | ICD-10-CM | POA: Diagnosis not present

## 2018-05-01 DIAGNOSIS — Z5111 Encounter for antineoplastic chemotherapy: Secondary | ICD-10-CM | POA: Diagnosis not present

## 2018-05-01 DIAGNOSIS — K219 Gastro-esophageal reflux disease without esophagitis: Secondary | ICD-10-CM | POA: Diagnosis not present

## 2018-05-01 DIAGNOSIS — Z7982 Long term (current) use of aspirin: Secondary | ICD-10-CM | POA: Diagnosis not present

## 2018-05-01 DIAGNOSIS — E785 Hyperlipidemia, unspecified: Secondary | ICD-10-CM | POA: Diagnosis not present

## 2018-05-01 DIAGNOSIS — C8208 Follicular lymphoma grade I, lymph nodes of multiple sites: Secondary | ICD-10-CM | POA: Diagnosis not present

## 2018-05-01 MED ORDER — DEXAMETHASONE SODIUM PHOSPHATE 10 MG/ML IJ SOLN
10.0000 mg | Freq: Once | INTRAMUSCULAR | Status: AC
  Administered 2018-05-01: 10 mg via INTRAVENOUS

## 2018-05-01 MED ORDER — SODIUM CHLORIDE 0.9 % IV SOLN
90.0000 mg/m2 | Freq: Once | INTRAVENOUS | Status: AC
  Administered 2018-05-01: 175 mg via INTRAVENOUS
  Filled 2018-05-01: qty 7

## 2018-05-01 MED ORDER — DEXAMETHASONE SODIUM PHOSPHATE 10 MG/ML IJ SOLN
INTRAMUSCULAR | Status: AC
  Filled 2018-05-01: qty 1

## 2018-05-01 MED ORDER — SODIUM CHLORIDE 0.9 % IV SOLN
Freq: Once | INTRAVENOUS | Status: AC
  Administered 2018-05-01: 17:00:00 via INTRAVENOUS
  Filled 2018-05-01: qty 250

## 2018-05-01 NOTE — Patient Instructions (Signed)
[facility] Discharge Instructions for Patients Receiving Chemotherapy  Today you received the following chemotherapy agents Bendeka.   To help prevent nausea and vomiting after your treatment, we encourage you to take your nausea medication as prescribed.    If you develop nausea and vomiting that is not controlled by your nausea medication, call the clinic.   BELOW ARE SYMPTOMS THAT SHOULD BE REPORTED IMMEDIATELY:  *FEVER GREATER THAN 100.5 F  *CHILLS WITH OR WITHOUT FEVER  NAUSEA AND VOMITING THAT IS NOT CONTROLLED WITH YOUR NAUSEA MEDICATION  *UNUSUAL SHORTNESS OF BREATH  *UNUSUAL BRUISING OR BLEEDING  TENDERNESS IN MOUTH AND THROAT WITH OR WITHOUT PRESENCE OF ULCERS  *URINARY PROBLEMS  *BOWEL PROBLEMS  UNUSUAL RASH Items with * indicate a potential emergency and should be followed up as soon as possible.  Feel free to call the clinic should you have any questions or concerns. The clinic phone number is [phone].  Please show the CHEMO ALERT CARD at check-in to the Emergency Department and triage nurse.  

## 2018-05-12 ENCOUNTER — Inpatient Hospital Stay: Payer: Medicare Other | Attending: Internal Medicine

## 2018-05-12 ENCOUNTER — Telehealth: Payer: Self-pay | Admitting: Medical Oncology

## 2018-05-12 DIAGNOSIS — Z5112 Encounter for antineoplastic immunotherapy: Secondary | ICD-10-CM | POA: Insufficient documentation

## 2018-05-12 DIAGNOSIS — C8208 Follicular lymphoma grade I, lymph nodes of multiple sites: Secondary | ICD-10-CM | POA: Insufficient documentation

## 2018-05-12 DIAGNOSIS — Z7982 Long term (current) use of aspirin: Secondary | ICD-10-CM | POA: Diagnosis not present

## 2018-05-12 DIAGNOSIS — E785 Hyperlipidemia, unspecified: Secondary | ICD-10-CM | POA: Insufficient documentation

## 2018-05-12 DIAGNOSIS — Z5111 Encounter for antineoplastic chemotherapy: Secondary | ICD-10-CM | POA: Insufficient documentation

## 2018-05-12 DIAGNOSIS — I252 Old myocardial infarction: Secondary | ICD-10-CM | POA: Insufficient documentation

## 2018-05-12 DIAGNOSIS — Z79899 Other long term (current) drug therapy: Secondary | ICD-10-CM | POA: Diagnosis not present

## 2018-05-12 DIAGNOSIS — K219 Gastro-esophageal reflux disease without esophagitis: Secondary | ICD-10-CM | POA: Insufficient documentation

## 2018-05-12 DIAGNOSIS — Z955 Presence of coronary angioplasty implant and graft: Secondary | ICD-10-CM | POA: Diagnosis not present

## 2018-05-12 LAB — CBC WITH DIFFERENTIAL (CANCER CENTER ONLY)
Abs Immature Granulocytes: 0.01 10*3/uL (ref 0.00–0.07)
Basophils Absolute: 0 10*3/uL (ref 0.0–0.1)
Basophils Relative: 1 %
Eosinophils Absolute: 0.2 10*3/uL (ref 0.0–0.5)
Eosinophils Relative: 7 %
HCT: 37.1 % — ABNORMAL LOW (ref 39.0–52.0)
Hemoglobin: 11.7 g/dL — ABNORMAL LOW (ref 13.0–17.0)
Immature Granulocytes: 0 %
LYMPHS PCT: 55 %
Lymphs Abs: 1.7 10*3/uL (ref 0.7–4.0)
MCH: 29 pg (ref 26.0–34.0)
MCHC: 31.5 g/dL (ref 30.0–36.0)
MCV: 92.1 fL (ref 80.0–100.0)
Monocytes Absolute: 0.7 10*3/uL (ref 0.1–1.0)
Monocytes Relative: 24 %
Neutro Abs: 0.4 10*3/uL — CL (ref 1.7–7.7)
Neutrophils Relative %: 13 %
Platelet Count: 216 10*3/uL (ref 150–400)
RBC: 4.03 MIL/uL — ABNORMAL LOW (ref 4.22–5.81)
RDW: 15.4 % (ref 11.5–15.5)
WBC Count: 3.1 10*3/uL — ABNORMAL LOW (ref 4.0–10.5)
nRBC: 0 % (ref 0.0–0.2)

## 2018-05-12 LAB — COMPREHENSIVE METABOLIC PANEL
ALBUMIN: 3.1 g/dL — AB (ref 3.5–5.0)
ALT: 65 U/L — AB (ref 0–44)
AST: 63 U/L — ABNORMAL HIGH (ref 15–41)
Alkaline Phosphatase: 212 U/L — ABNORMAL HIGH (ref 38–126)
Anion gap: 9 (ref 5–15)
BUN: 11 mg/dL (ref 8–23)
CO2: 26 mmol/L (ref 22–32)
CREATININE: 1.01 mg/dL (ref 0.61–1.24)
Calcium: 8.5 mg/dL — ABNORMAL LOW (ref 8.9–10.3)
Chloride: 107 mmol/L (ref 98–111)
GFR calc Af Amer: >60 mL/min (ref >60)
GFR calc non Af Amer: >60 mL/min (ref >60)
GLUCOSE: 74 mg/dL (ref 70–99)
Potassium: 4.3 mmol/L (ref 3.5–5.1)
Sodium: 142 mmol/L (ref 135–145)
Total Bilirubin: 0.4 mg/dL (ref 0.3–1.2)
Total Protein: 5.5 g/dL — ABNORMAL LOW (ref 6.5–8.1)

## 2018-05-12 NOTE — Telephone Encounter (Signed)
Lab review- reviewed ANC with pt and neutropenic precautions." I am feeling well". He voiced understanding when to call.

## 2018-05-28 ENCOUNTER — Encounter: Payer: Medicare Other | Admitting: Medical

## 2018-05-28 ENCOUNTER — Other Ambulatory Visit: Payer: Self-pay

## 2018-05-28 ENCOUNTER — Telehealth: Payer: Self-pay | Admitting: Physician Assistant

## 2018-05-28 ENCOUNTER — Inpatient Hospital Stay (HOSPITAL_BASED_OUTPATIENT_CLINIC_OR_DEPARTMENT_OTHER): Payer: Medicare Other | Admitting: Physician Assistant

## 2018-05-28 ENCOUNTER — Encounter: Payer: Self-pay | Admitting: Physician Assistant

## 2018-05-28 ENCOUNTER — Inpatient Hospital Stay: Payer: Medicare Other

## 2018-05-28 VITALS — BP 143/82 | HR 72 | Temp 98.1°F | Resp 18

## 2018-05-28 VITALS — BP 129/83 | HR 78 | Temp 98.0°F | Resp 18 | Ht 67.0 in | Wt 162.7 lb

## 2018-05-28 DIAGNOSIS — Z7982 Long term (current) use of aspirin: Secondary | ICD-10-CM | POA: Diagnosis not present

## 2018-05-28 DIAGNOSIS — I252 Old myocardial infarction: Secondary | ICD-10-CM

## 2018-05-28 DIAGNOSIS — E785 Hyperlipidemia, unspecified: Secondary | ICD-10-CM

## 2018-05-28 DIAGNOSIS — Z79899 Other long term (current) drug therapy: Secondary | ICD-10-CM

## 2018-05-28 DIAGNOSIS — C8208 Follicular lymphoma grade I, lymph nodes of multiple sites: Secondary | ICD-10-CM | POA: Diagnosis not present

## 2018-05-28 DIAGNOSIS — Z5111 Encounter for antineoplastic chemotherapy: Secondary | ICD-10-CM

## 2018-05-28 DIAGNOSIS — K219 Gastro-esophageal reflux disease without esophagitis: Secondary | ICD-10-CM | POA: Diagnosis not present

## 2018-05-28 DIAGNOSIS — Z955 Presence of coronary angioplasty implant and graft: Secondary | ICD-10-CM | POA: Diagnosis not present

## 2018-05-28 DIAGNOSIS — Z5112 Encounter for antineoplastic immunotherapy: Secondary | ICD-10-CM | POA: Diagnosis not present

## 2018-05-28 DIAGNOSIS — C8100 Nodular lymphocyte predominant Hodgkin lymphoma, unspecified site: Secondary | ICD-10-CM

## 2018-05-28 LAB — CBC WITH DIFFERENTIAL (CANCER CENTER ONLY)
ABS IMMATURE GRANULOCYTES: 0.08 10*3/uL — AB (ref 0.00–0.07)
Basophils Absolute: 0.1 10*3/uL (ref 0.0–0.1)
Basophils Relative: 1 %
Eosinophils Absolute: 0.2 10*3/uL (ref 0.0–0.5)
Eosinophils Relative: 4 %
HCT: 37.3 % — ABNORMAL LOW (ref 39.0–52.0)
Hemoglobin: 12.1 g/dL — ABNORMAL LOW (ref 13.0–17.0)
Immature Granulocytes: 1 %
Lymphocytes Relative: 53 %
Lymphs Abs: 3 10*3/uL (ref 0.7–4.0)
MCH: 29.5 pg (ref 26.0–34.0)
MCHC: 32.4 g/dL (ref 30.0–36.0)
MCV: 91 fL (ref 80.0–100.0)
Monocytes Absolute: 1 10*3/uL (ref 0.1–1.0)
Monocytes Relative: 18 %
Neutro Abs: 1.3 10*3/uL — ABNORMAL LOW (ref 1.7–7.7)
Neutrophils Relative %: 23 %
Platelet Count: 164 10*3/uL (ref 150–400)
RBC: 4.1 MIL/uL — ABNORMAL LOW (ref 4.22–5.81)
RDW: 15.5 % (ref 11.5–15.5)
WBC Count: 5.6 10*3/uL (ref 4.0–10.5)
nRBC: 0 % (ref 0.0–0.2)

## 2018-05-28 LAB — CMP (CANCER CENTER ONLY)
ALT: 42 U/L (ref 0–44)
AST: 39 U/L (ref 15–41)
Albumin: 3.2 g/dL — ABNORMAL LOW (ref 3.5–5.0)
Alkaline Phosphatase: 224 U/L — ABNORMAL HIGH (ref 38–126)
Anion gap: 8 (ref 5–15)
BILIRUBIN TOTAL: 0.5 mg/dL (ref 0.3–1.2)
BUN: 12 mg/dL (ref 8–23)
CO2: 27 mmol/L (ref 22–32)
Calcium: 8.3 mg/dL — ABNORMAL LOW (ref 8.9–10.3)
Chloride: 108 mmol/L (ref 98–111)
Creatinine: 0.96 mg/dL (ref 0.61–1.24)
GFR, Est AFR Am: >60 mL/min (ref >60)
GFR, Estimated: >60 mL/min (ref >60)
Glucose, Bld: 80 mg/dL (ref 70–99)
POTASSIUM: 4.1 mmol/L (ref 3.5–5.1)
Sodium: 143 mmol/L (ref 135–145)
Total Protein: 5.6 g/dL — ABNORMAL LOW (ref 6.5–8.1)

## 2018-05-28 MED ORDER — DEXAMETHASONE SODIUM PHOSPHATE 10 MG/ML IJ SOLN
INTRAMUSCULAR | Status: AC
  Filled 2018-05-28: qty 1

## 2018-05-28 MED ORDER — DIPHENHYDRAMINE HCL 25 MG PO CAPS
50.0000 mg | ORAL_CAPSULE | Freq: Once | ORAL | Status: AC
  Administered 2018-05-28: 50 mg via ORAL

## 2018-05-28 MED ORDER — ACETAMINOPHEN 325 MG PO TABS
ORAL_TABLET | ORAL | Status: AC
  Filled 2018-05-28: qty 2

## 2018-05-28 MED ORDER — PALONOSETRON HCL INJECTION 0.25 MG/5ML
0.2500 mg | Freq: Once | INTRAVENOUS | Status: AC
  Administered 2018-05-28: 0.25 mg via INTRAVENOUS

## 2018-05-28 MED ORDER — SODIUM CHLORIDE 0.9 % IV SOLN
Freq: Once | INTRAVENOUS | Status: AC
  Administered 2018-05-28: 13:00:00 via INTRAVENOUS
  Filled 2018-05-28: qty 250

## 2018-05-28 MED ORDER — DEXAMETHASONE SODIUM PHOSPHATE 10 MG/ML IJ SOLN
10.0000 mg | Freq: Once | INTRAMUSCULAR | Status: AC
  Administered 2018-05-28: 10 mg via INTRAVENOUS

## 2018-05-28 MED ORDER — SODIUM CHLORIDE 0.9 % IV SOLN
375.0000 mg/m2 | Freq: Once | INTRAVENOUS | Status: AC
  Administered 2018-05-28: 700 mg via INTRAVENOUS
  Filled 2018-05-28: qty 50

## 2018-05-28 MED ORDER — PALONOSETRON HCL INJECTION 0.25 MG/5ML
INTRAVENOUS | Status: AC
  Filled 2018-05-28: qty 5

## 2018-05-28 MED ORDER — DIPHENHYDRAMINE HCL 25 MG PO CAPS
ORAL_CAPSULE | ORAL | Status: AC
  Filled 2018-05-28: qty 2

## 2018-05-28 MED ORDER — SODIUM CHLORIDE 0.9 % IV SOLN
90.0000 mg/m2 | Freq: Once | INTRAVENOUS | Status: AC
  Administered 2018-05-28: 175 mg via INTRAVENOUS
  Filled 2018-05-28: qty 7

## 2018-05-28 MED ORDER — ACETAMINOPHEN 325 MG PO TABS
650.0000 mg | ORAL_TABLET | Freq: Once | ORAL | Status: AC
  Administered 2018-05-28: 650 mg via ORAL

## 2018-05-28 NOTE — Patient Instructions (Signed)
Coronavirus (COVID-19) Are you at risk?  Are you at risk for the Coronavirus (COVID-19)?  To be considered HIGH RISK for Coronavirus (COVID-19), you have to meet the following criteria:  . Traveled to [place], [place], [place], [place] or [place]; or in the [place] to [place], [place], [place], or [place]; and have fever, cough, and shortness of breath within the last 2 weeks of travel OR . Been in close contact with a person diagnosed with COVID-19 within the last 2 weeks and have fever, cough, and shortness of breath . IF YOU DO NOT MEET THESE CRITERIA, YOU ARE CONSIDERED LOW RISK FOR COVID-19.  What to do if you are HIGH RISK for COVID-19?  . If you are having a medical emergency, call 911. . Seek medical care right away. Before you go to a doctor's office, urgent care or emergency department, call ahead and tell them about your recent travel, contact with someone diagnosed with COVID-19, and your symptoms. You should receive instructions from your physician's office regarding next steps of care.  . When you arrive at healthcare provider, tell the healthcare staff immediately you have returned from visiting [place], [place], [place], [place] or [place]; or traveled in the [place] to [place], [place], [place], or [place]; in the last two weeks or you have been in close contact with a person diagnosed with COVID-19 in the last 2 weeks.   . Tell the health care staff about your symptoms: fever, cough and shortness of breath. . After you have been seen by a medical provider, you will be either: o Tested for (COVID-19) and discharged home on quarantine except to seek medical care if symptoms worsen, and asked to  - Stay home and avoid contact with others until you get your results (4-5 days)  - Avoid travel on public transportation if possible (such as bus, train, or airplane) or o Sent to the Emergency Department by EMS for evaluation, COVID-19 testing, and possible  admission depending on your condition and test results.  What to do if you are LOW RISK for COVID-19?  Reduce your risk of any infection by using the same precautions used for avoiding the common cold or flu:  . Wash your hands often with soap and warm water for at least 20 seconds.  If soap and water are not readily available, use an alcohol-based hand sanitizer with at least 60% alcohol.  . If coughing or sneezing, cover your mouth and nose by coughing or sneezing into the elbow areas of your shirt or coat, into a tissue or into your sleeve (not your hands). . Avoid shaking hands with others and consider head nods or verbal greetings only. . Avoid touching your eyes, nose, or mouth with unwashed hands.  . Avoid close contact with people who are sick. . Avoid places or events with large numbers of people in one location, like concerts or sporting events. . Carefully consider travel plans you have or are making. . If you are planning any travel outside or inside the [place], visit the CDC's Travelers' Health webpage for the latest health notices. . If you have some symptoms but not all symptoms, continue to monitor at home and seek medical attention if your symptoms worsen. . If you are having a medical emergency, call 911.   ADDITIONAL HEALTHCARE OPTIONS FOR PATIENTS  [facility] / e-Visit: [email or web address]         [facility]: [phone]  [place]   Urgent Care: [facility]: [facility] Discharge Instructions for Patients Receiving Chemotherapy  Today you received the following chemotherapy agents:  Rituxan & Verl Dicker  To help prevent nausea and vomiting after your treatment, we encourage you to take your nausea medication as prescribed.   If you develop nausea and vomiting that is not controlled by your nausea medication, call the clinic.    BELOW ARE SYMPTOMS THAT SHOULD BE REPORTED IMMEDIATELY:  *FEVER GREATER THAN 100.5 F  *CHILLS WITH OR WITHOUT FEVER  NAUSEA AND VOMITING THAT IS NOT CONTROLLED WITH YOUR NAUSEA MEDICATION  *UNUSUAL SHORTNESS OF BREATH  *UNUSUAL BRUISING OR BLEEDING  TENDERNESS IN MOUTH AND THROAT WITH OR WITHOUT PRESENCE OF ULCERS  *URINARY PROBLEMS  *BOWEL PROBLEMS  UNUSUAL RASH Items with * indicate a potential emergency and should be followed up as soon as possible.  Feel free to call the clinic should you have any questions or concerns. The clinic phone number is [phone].  Please show the [facility] at check-in to the Emergency Department and triage nurse.

## 2018-05-28 NOTE — Progress Notes (Unsigned)
Per Cassie Heilingoepter, PA, ok to treat with ANC of 1.3.  Per Dr. Julien Nordmann, ok to leave PIV in for treatment on 05/29/18.   1615: Pt stated IV site was bothering him.  Upon inspection, pt's R forearm does look larger than L forearm.  No redness evident. IV flushes without resistance or discomfort, but no blood return is evident. [name], PA notified. Advised to place heat or cold pack on affected arm, depending on patient preference. IV removed, and IV team paged to reaccess pt.

## 2018-05-28 NOTE — Telephone Encounter (Signed)
Scheduled per 03/25 los Patient received printed avs and calender.

## 2018-05-28 NOTE — Progress Notes (Signed)
[facility] OFFICE PROGRESS NOTE  Patient, No Pcp Per No address on file  DIAGNOSIS: Bulky stage IV low-grade lymphoma diagnosed in November 2009.  PRIOR THERAPY:  1. Status post 7 cycles of systemic chemotherapy with CHOP/Rituxan. Last dose was given May 2010. 2. Status post 6 cycles of maintenance Rituxan 375 mg/sq m given every 2 months. Last dose was given on May 29, 2010, and the patient was lost to followup at that time. He received another dose on 12/11/2010, then again missed 2 doses. 3. systemic chemotherapy with Rituxan 375 mg/M2 on day 1 and that bendamustine 90 mg/M2 on days 1 and 2 every 4 weeks, status post 6 cycles. 4. Maintenance Rituxan 375 mg/M2 every 2 months, status post 12 cycles  5. Rituxan 375 MG/M2 given weekly for 4 weeks and this is followed by maintenance treatment every 2 months, status post 22 cycles discontinued secondary to disease progression.  CURRENT THERAPY: Systemic chemotherapy with bendamustine 90 mg/M2 on days 1 and 2 and Rituxan 375 mg/M2 on day 1 every 4 weeks.  First dose March 03, 2018.  Status post 3 cycles.  INTERVAL HISTORY: Eric [name] 78 y.o. male returns to the clinic for a follow-up visit.  The patient is feeling well today without any concerning complaints.  He denies any fever, chills, night sweats, weight loss, fatigue, or lymphadenopathy.  He denies any chest pain, shortness of breath, cough, or hemoptysis.  He denies any nausea, vomiting, diarrhea, or constipation.  He denies any recent infections.  He continues to tolerate his treatment well without any adverse side effects.  The patient is here for evaluation before starting cycle #4.   MEDICAL HISTORY: Past Medical History:  Diagnosis Date  . Cancer ([place])   . Coronary atherosclerosis of native coronary artery   . Encounter for antineoplastic chemotherapy 06/20/2015  . GERD (gastroesophageal reflux disease)   . History of heart attack   . Hyperlipidemia   .  Myocardial infarction ([place])   . Non Hodgkin's lymphoma ([place])     ALLERGIES:  is allergic to neosporin [neomycin-bacitracin zn-polymyx].  MEDICATIONS:  Current Outpatient Medications  Medication Sig Dispense Refill  . acetaminophen (TYLENOL) 325 MG tablet Take by mouth.    Marland Kitchen aspirin EC 81 MG tablet Take 81 mg by mouth at bedtime.    Marland Kitchen atorvastatin (LIPITOR) 40 MG tablet Take 1 tablet (40 mg total) by mouth daily at 6 PM. 90 tablet 3  . diphenhydrAMINE (BENADRYL) 25 MG tablet Take 25 mg by mouth at bedtime as needed for sleep.     Marland Kitchen omeprazole (PRILOSEC) 20 MG capsule Take 20 mg by mouth daily as needed (acid relfux).    . Polyethylene Glycol 3350 (PEG 3350) POWD Take by mouth.    . sildenafil (VIAGRA) 100 MG tablet Take 100 mg by mouth daily as needed.  3  . sildenafil (VIAGRA) 25 MG tablet Take 1 tablet (25 mg total) by mouth daily as needed. Take 2-3 tablets by mouth once daily as needed. 90 tablet 6  . prochlorperazine (COMPAZINE) 10 MG tablet Take 1 tablet (10 mg total) by mouth every 6 (six) hours as needed for nausea or vomiting. (Patient not taking: Reported on 05/28/2018) 30 tablet 0  . traMADol (ULTRAM) 50 MG tablet Take by mouth.     No current facility-administered medications for this visit.     SURGICAL HISTORY:  Past Surgical History:  Procedure Laterality Date  . CORONARY STENT PLACEMENT    . LEFT HEART  CATHETERIZATION WITH CORONARY ANGIOGRAM N/A 03/03/2012   Procedure: LEFT HEART CATHETERIZATION WITH CORONARY ANGIOGRAM;  Surgeon: [name], MD;  Location: [facility] CATH LAB;  Service: Cardiovascular;  Laterality: N/A;  . PERCUTANEOUS CORONARY STENT INTERVENTION (PCI-S) N/A 03/04/2012   Procedure: PERCUTANEOUS CORONARY STENT INTERVENTION (PCI-S);  Surgeon: [name], MD;  Location: [facility] CATH LAB;  Service: Cardiovascular;  Laterality: N/A;    REVIEW OF SYSTEMS:   Review of Systems  Constitutional: Negative for appetite change, chills, fatigue, fever and unexpected  weight change.  HENT:   Negative for mouth sores, nosebleeds, sore throat and trouble swallowing.   Eyes: Negative for eye problems and icterus.  Respiratory: Negative for cough, hemoptysis, shortness of breath and wheezing.   Cardiovascular: Negative for chest pain and leg swelling.  Gastrointestinal: Negative for abdominal pain, constipation, diarrhea, nausea and vomiting.  Genitourinary: Negative for bladder incontinence, difficulty urinating, dysuria, frequency and hematuria.   Musculoskeletal: Negative for back pain, gait problem, neck pain and neck stiffness.  Skin: Negative for itching and rash.  Neurological: Negative for dizziness, extremity weakness, gait problem, headaches, light-headedness and seizures.  Hematological: Negative for adenopathy. Does not bruise/bleed easily.  Psychiatric/Behavioral: Negative for confusion, depression and sleep disturbance. The patient is not nervous/anxious.     PHYSICAL EXAMINATION:  Blood pressure 129/83, pulse 78, temperature 98 F (36.7 C), temperature source Oral, resp. rate 18, height 5\' 7"  (1.702 m), weight 162 lb 11.2 oz (73.8 kg), SpO2 100 %.  ECOG PERFORMANCE STATUS: 0 - Asymptomatic  Physical Exam  Constitutional: Oriented to person, place, and time and well-developed, well-nourished, and in no distress. No distress.  HENT:  Head: Normocephalic and atraumatic.  Mouth/Throat: Oropharynx is clear and moist. No oropharyngeal exudate.  Eyes: Conjunctivae are normal. Right eye exhibits no discharge. Left eye exhibits no discharge. No scleral icterus.  Neck: Normal range of motion. Neck supple.  Cardiovascular: Normal rate, regular rhythm, normal heart sounds and intact distal pulses.   Pulmonary/Chest: Effort normal and breath sounds normal. No respiratory distress. No wheezes. No rales.  Abdominal: Soft. Bowel sounds are normal. Exhibits no distension and no mass. There is no tenderness.  Musculoskeletal: Normal range of motion.  Exhibits no edema.  Lymphadenopathy:    No cervical adenopathy.  Neurological: Alert and oriented to person, place, and time. Exhibits normal muscle tone. Gait normal. Coordination normal.  Skin: Skin is warm and dry. No rash noted. Not diaphoretic. No erythema. No pallor.  Psychiatric: Mood, memory and judgment normal.  Vitals reviewed.  LABORATORY DATA: Lab Results  Component Value Date   WBC 5.6 05/28/2018   HGB 12.1 (L) 05/28/2018   HCT 37.3 (L) 05/28/2018   MCV 91.0 05/28/2018   PLT 164 05/28/2018      Chemistry      Component Value Date/Time   NA 143 05/28/2018 1006   NA 140 12/09/2017 0859   NA 140 02/14/2017 0804   K 4.1 05/28/2018 1006   K 4.4 02/14/2017 0804   CL 108 05/28/2018 1006   CL 106 08/06/2012 1244   CO2 27 05/28/2018 1006   CO2 22 02/14/2017 0804   BUN 12 05/28/2018 1006   BUN 14 12/09/2017 0859   BUN 14.8 02/14/2017 0804   CREATININE 0.96 05/28/2018 1006   CREATININE 1.1 02/14/2017 0804      Component Value Date/Time   CALCIUM 8.3 (L) 05/28/2018 1006   CALCIUM 9.2 02/14/2017 0804   ALKPHOS 224 (H) 05/28/2018 1006   ALKPHOS 138 02/14/2017 0804  AST 39 05/28/2018 1006   AST 29 02/14/2017 0804   ALT 42 05/28/2018 1006   ALT 27 02/14/2017 0804   BILITOT 0.5 05/28/2018 1006   BILITOT 0.61 02/14/2017 0804       RADIOGRAPHIC STUDIES:  No results found.   ASSESSMENT/PLAN:  This is a very pleasant 78 year old male with bulky stage IV follicular lymphoma.  He was initially treated with CHOP/Rituxan followed by maintenance Rituxan. The patient had disease recurrence and he was restarted again on treatment with Rituxan every 2 months status post 22 cycles. The patient has been tolerating this treatment well with no concerning adverse effects. Repeat scan of the chest, abdomen and pelvis performed 3 months ago showed progressive retroperitoneal and mesenteric lymphadenopathy consistent with progressive lymphoma. The patient was started on treatment  with bendamustine and Rituxan status post 3 cycles.  He tolerated the first 3 cycles of his treatment well.   Patient was seen with Dr. Julien Nordmann today.  Labs were reviewed with the patient.  Recommendd he proceed with cycle #4 of treatment today as scheduled.  I will arrange for a restaging CT scan of the chest, abdomen, and pelvis to be performed prior to his next visit in 1 month.  I will see him back at that time for evaluation and to review his scan results.  The patient was advised to call immediately if he has any concerning symptoms in the interval. The patient voices understanding of current disease status and treatment options and is in agreement with the current care plan. All questions were answered. The patient knows to call the clinic with any problems, questions or concerns. We can certainly see the patient much sooner if necessary   Orders Placed This Encounter  Procedures  . CT Chest W Contrast    Standing Status:   Future    Standing Expiration Date:   05/28/2019    Order Specific Question:   ** REASON FOR EXAM (FREE TEXT)    Answer:   Restaging Lymphoma    Order Specific Question:   If indicated for the ordered procedure, I authorize the administration of contrast media per Radiology protocol    Answer:   Yes    Order Specific Question:   Preferred imaging location?    Answer:   [facility]    Order Specific Question:   Radiology Contrast Protocol - do NOT remove file path    Answer:   \\[email or web address]  . CT Abdomen Pelvis W Contrast    Standing Status:   Future    Standing Expiration Date:   05/28/2019    Order Specific Question:   ** REASON FOR EXAM (FREE TEXT)    Answer:   Restaging Lymphoma    Order Specific Question:   If indicated for the ordered procedure, I authorize the administration of contrast media per Radiology protocol    Answer:   Yes    Order Specific Question:   Preferred imaging location?    Answer:   [facility]    Order Specific Question:   Is Oral Contrast requested for this exam?    Answer:   Yes, Per Radiology protocol    Order Specific Question:   Radiology Contrast Protocol - do NOT remove file path    Answer:   \\[email or web address].pdf  . CBC with Differential ([place] Only)    Standing Status:   Future    Standing Expiration Date:   05/28/2019  . CMP ([place] only)  Standing Status:   Future    Standing Expiration Date:   05/28/2019     Tobe Sos Ari Bernabei, PA-C 05/28/18  ADDENDUM: Hematology/Oncology Attending: I had a face-to-face encounter with the patient today.  I recommended his care plan.  This is a very pleasant 78 years old African-American male with bulky stage IV follicular lymphoma.  He is currently undergoing systemic chemotherapy with bendamustine and Rituxan status post 3 cycles.  He has been tolerating this treatment well with no concerning adverse effects. I recommended for the patient to proceed with cycle #4 today as scheduled. We will see him back for follow-up visit in 1 months for evaluation after repeating CT scan of the chest, abdomen and pelvis for restaging of his disease. The patient was advised to call immediately if he has any concerning symptoms in the interval.  Disclaimer: This note was dictated with voice recognition software. Similar sounding words can inadvertently be transcribed and may be missed upon review. Eilleen Kempf, MD 05/28/18

## 2018-05-29 ENCOUNTER — Inpatient Hospital Stay (HOSPITAL_BASED_OUTPATIENT_CLINIC_OR_DEPARTMENT_OTHER): Payer: Medicare Other | Admitting: Medical

## 2018-05-29 ENCOUNTER — Other Ambulatory Visit: Payer: Self-pay | Admitting: Medical

## 2018-05-29 ENCOUNTER — Inpatient Hospital Stay: Payer: Medicare Other

## 2018-05-29 ENCOUNTER — Other Ambulatory Visit: Payer: Self-pay

## 2018-05-29 VITALS — BP 138/81 | HR 87 | Temp 97.6°F | Resp 18

## 2018-05-29 DIAGNOSIS — Z7982 Long term (current) use of aspirin: Secondary | ICD-10-CM | POA: Diagnosis not present

## 2018-05-29 DIAGNOSIS — Z5111 Encounter for antineoplastic chemotherapy: Secondary | ICD-10-CM | POA: Diagnosis not present

## 2018-05-29 DIAGNOSIS — I252 Old myocardial infarction: Secondary | ICD-10-CM | POA: Diagnosis not present

## 2018-05-29 DIAGNOSIS — Z5112 Encounter for antineoplastic immunotherapy: Secondary | ICD-10-CM | POA: Diagnosis not present

## 2018-05-29 DIAGNOSIS — C8208 Follicular lymphoma grade I, lymph nodes of multiple sites: Secondary | ICD-10-CM

## 2018-05-29 DIAGNOSIS — Z79899 Other long term (current) drug therapy: Secondary | ICD-10-CM | POA: Diagnosis not present

## 2018-05-29 DIAGNOSIS — Z955 Presence of coronary angioplasty implant and graft: Secondary | ICD-10-CM | POA: Diagnosis not present

## 2018-05-29 DIAGNOSIS — K219 Gastro-esophageal reflux disease without esophagitis: Secondary | ICD-10-CM | POA: Diagnosis not present

## 2018-05-29 DIAGNOSIS — E785 Hyperlipidemia, unspecified: Secondary | ICD-10-CM | POA: Diagnosis not present

## 2018-05-29 DIAGNOSIS — T80818A Extravasation of other vesicant agent, initial encounter: Secondary | ICD-10-CM

## 2018-05-29 DIAGNOSIS — C8100 Nodular lymphocyte predominant Hodgkin lymphoma, unspecified site: Secondary | ICD-10-CM

## 2018-05-29 MED ORDER — DEXAMETHASONE SODIUM PHOSPHATE 10 MG/ML IJ SOLN
10.0000 mg | Freq: Once | INTRAMUSCULAR | Status: AC
  Administered 2018-05-29: 10 mg via INTRAVENOUS

## 2018-05-29 MED ORDER — LIDOCAINE VISCOUS HCL 2 % MT SOLN: 15.0000 mL | Freq: Once | OROMUCOSAL | Status: DC

## 2018-05-29 MED ORDER — ALUM & MAG HYDROXIDE-SIMETH 200-200-20 MG/5ML PO SUSP
ORAL | Status: AC
  Filled 2018-05-29: qty 30

## 2018-05-29 MED ORDER — DEXAMETHASONE SODIUM PHOSPHATE 10 MG/ML IJ SOLN
INTRAMUSCULAR | Status: AC
  Filled 2018-05-29: qty 1

## 2018-05-29 MED ORDER — ALUM & MAG HYDROXIDE-SIMETH 200-200-20 MG/5ML PO SUSP: 30.0000 mL | Freq: Once | ORAL | Status: DC

## 2018-05-29 MED ORDER — LIDOCAINE VISCOUS HCL 2 % MT SOLN
15.0000 mL | Freq: Once | OROMUCOSAL | Status: AC
  Administered 2018-05-29: 15 mL via OROMUCOSAL
  Filled 2018-05-29: qty 15

## 2018-05-29 MED ORDER — SODIUM CHLORIDE 0.9 % IV SOLN
90.0000 mg/m2 | Freq: Once | INTRAVENOUS | Status: AC
  Administered 2018-05-29: 175 mg via INTRAVENOUS
  Filled 2018-05-29: qty 7

## 2018-05-29 MED ORDER — SODIUM CHLORIDE 0.9 % IV SOLN
Freq: Once | INTRAVENOUS | Status: AC
  Administered 2018-05-29: 15:00:00 via INTRAVENOUS
  Filled 2018-05-29: qty 250

## 2018-05-29 MED ORDER — ALUM & MAG HYDROXIDE-SIMETH 200-200-20 MG/5ML PO SUSP
30.0000 mL | Freq: Once | ORAL | Status: AC
  Administered 2018-05-29: 30 mL via ORAL

## 2018-05-29 NOTE — Patient Instructions (Signed)
[facility] Discharge Instructions for Patients Receiving Chemotherapy  Today you received the following chemotherapy agents Bend[place]a.  To help prevent nausea and vomiting after your treatment, we encourage you to take your nausea medication as directed.   If you develop nausea and vomiting that is not controlled by your nausea medication, call the clinic.   BELOW ARE SYMPTOMS THAT SHOULD BE REPORTED IMMEDIATELY:  *FEVER GREATER THAN 100.5 F  *CHILLS WITH OR WITHOUT FEVER  NAUSEA AND VOMITING THAT IS NOT CONTROLLED WITH YOUR NAUSEA MEDICATION  *UNUSUAL SHORTNESS OF BREATH  *UNUSUAL BRUISING OR BLEEDING  TENDERNESS IN MOUTH AND THROAT WITH OR WITHOUT PRESENCE OF ULCERS  *URINARY PROBLEMS  *BOWEL PROBLEMS  UNUSUAL RASH Items with * indicate a potential emergency and should be followed up as soon as possible.  Feel free to call the clinic should you have any questions or concerns. The clinic phone number is [phone].  Please show the CHEMO ALERT CARD at check-in to the Emergency Department and triage nurse.   

## 2018-05-30 NOTE — Progress Notes (Signed)
   DATE: 05/29/2018      IV EXTRAVASATION (neutral)  MD:  Dr. Julien Nordmann  AGENT RECEIVED AT TIME OF EXTRAVASATION:   Rituximab  IV SITE LOCATION: Rituximab  INTERVENTION:  1) IV stopped  2) 0 ml liquid/blood aspirated from IV site.  3) Patient Teaching Instruction Sheet reviewed with Eric [name].  A) Apply heat to area for 15 to 20 minutes 4 times daily for the next 48 hours B) Elevate the affected site for the next 48 hours. C) Eric [name] was instructed to call [phone] or return if he has questions or notes acute changes to the area.  Review of Systems  Constitutional: Negative for chills, diaphoresis, fatigue and fever.  HENT: Negative for congestion, postnasal drip, rhinorrhea and sore throat.   Respiratory: Negative for cough, shortness of breath and wheezing.   Cardiovascular: Negative for palpitations.  Skin: Negative for color change, pallor, rash and wound.  Neurological: Negative for headaches.    Physical Exam Constitutional:      General: He is not in acute distress.    Appearance: He is not diaphoretic.  HENT:     Head: Normocephalic and atraumatic.  Cardiovascular:     Rate and Rhythm: Normal rate and regular rhythm.     Heart sounds: Normal heart sounds. No murmur. No friction rub. No gallop.   Pulmonary:     Effort: Pulmonary effort is normal. No respiratory distress.     Breath sounds: Normal breath sounds. No stridor. No wheezing or rales.  Musculoskeletal:        General: No deformity.  Skin:    General: Skin is warm and dry.     Findings: No erythema or rash.     Comments: Mild induration of the right anterior forearm proximal to the IV insertion site.  Neurological:     Mental Status: He is alert.     Coordination: Coordination normal.     [name], MHS, PA-C

## 2018-06-19 ENCOUNTER — Other Ambulatory Visit: Payer: Self-pay | Admitting: *Deleted

## 2018-06-19 DIAGNOSIS — C8100 Nodular lymphocyte predominant Hodgkin lymphoma, unspecified site: Secondary | ICD-10-CM

## 2018-06-23 ENCOUNTER — Encounter (HOSPITAL_COMMUNITY): Payer: Self-pay

## 2018-06-23 ENCOUNTER — Other Ambulatory Visit: Payer: Self-pay

## 2018-06-23 ENCOUNTER — Ambulatory Visit (HOSPITAL_COMMUNITY): Admission: RE | Admit: 2018-06-23 | Payer: Medicare Other | Source: Ambulatory Visit

## 2018-06-23 ENCOUNTER — Ambulatory Visit (HOSPITAL_COMMUNITY)
Admission: RE | Admit: 2018-06-23 | Discharge: 2018-06-23 | Disposition: A | Discharge status: Home / Self Care | Payer: Medicare Other | Source: Ambulatory Visit | Attending: Internal Medicine | Admitting: Internal Medicine

## 2018-06-23 DIAGNOSIS — C8598 Non-Hodgkin lymphoma, unspecified, lymph nodes of multiple sites: Secondary | ICD-10-CM | POA: Diagnosis not present

## 2018-06-23 DIAGNOSIS — C8100 Nodular lymphocyte predominant Hodgkin lymphoma, unspecified site: Secondary | ICD-10-CM | POA: Diagnosis not present

## 2018-06-23 MED ORDER — IOHEXOL 300 MG/ML  SOLN
100.0000 mL | Freq: Once | INTRAMUSCULAR | Status: AC | PRN
  Administered 2018-06-23: 100 mL via INTRAVENOUS

## 2018-06-30 ENCOUNTER — Encounter: Payer: Self-pay | Admitting: Internal Medicine

## 2018-06-30 ENCOUNTER — Other Ambulatory Visit: Payer: Self-pay

## 2018-06-30 ENCOUNTER — Inpatient Hospital Stay: Payer: Medicare Other | Attending: Internal Medicine

## 2018-06-30 ENCOUNTER — Inpatient Hospital Stay (HOSPITAL_BASED_OUTPATIENT_CLINIC_OR_DEPARTMENT_OTHER): Payer: Medicare Other | Admitting: Internal Medicine

## 2018-06-30 VITALS — BP 132/79 | HR 80 | Temp 98.0°F | Resp 18 | Ht 67.0 in | Wt 159.8 lb

## 2018-06-30 DIAGNOSIS — E785 Hyperlipidemia, unspecified: Secondary | ICD-10-CM

## 2018-06-30 DIAGNOSIS — C8100 Nodular lymphocyte predominant Hodgkin lymphoma, unspecified site: Secondary | ICD-10-CM

## 2018-06-30 DIAGNOSIS — Z79899 Other long term (current) drug therapy: Secondary | ICD-10-CM

## 2018-06-30 DIAGNOSIS — Z9221 Personal history of antineoplastic chemotherapy: Secondary | ICD-10-CM

## 2018-06-30 DIAGNOSIS — Z7982 Long term (current) use of aspirin: Secondary | ICD-10-CM | POA: Diagnosis not present

## 2018-06-30 DIAGNOSIS — C8208 Follicular lymphoma grade I, lymph nodes of multiple sites: Secondary | ICD-10-CM | POA: Diagnosis not present

## 2018-06-30 DIAGNOSIS — I252 Old myocardial infarction: Secondary | ICD-10-CM | POA: Diagnosis not present

## 2018-06-30 LAB — CBC WITH DIFFERENTIAL (CANCER CENTER ONLY)
Abs Immature Granulocytes: 0.07 10*3/uL (ref 0.00–0.07)
Basophils Absolute: 0 10*3/uL (ref 0.0–0.1)
Basophils Relative: 1 %
Eosinophils Absolute: 0.2 10*3/uL (ref 0.0–0.5)
Eosinophils Relative: 3 %
HCT: 38.8 % — ABNORMAL LOW (ref 39.0–52.0)
Hemoglobin: 12.4 g/dL — ABNORMAL LOW (ref 13.0–17.0)
Immature Granulocytes: 1 %
Lymphocytes Relative: 39 %
Lymphs Abs: 1.9 10*3/uL (ref 0.7–4.0)
MCH: 29.2 pg (ref 26.0–34.0)
MCHC: 32 g/dL (ref 30.0–36.0)
MCV: 91.3 fL (ref 80.0–100.0)
Monocytes Absolute: 1.2 10*3/uL — ABNORMAL HIGH (ref 0.1–1.0)
Monocytes Relative: 24 %
Neutro Abs: 1.6 10*3/uL — ABNORMAL LOW (ref 1.7–7.7)
Neutrophils Relative %: 32 %
Platelet Count: 215 10*3/uL (ref 150–400)
RBC: 4.25 MIL/uL (ref 4.22–5.81)
RDW: 16.1 % — ABNORMAL HIGH (ref 11.5–15.5)
WBC Count: 4.9 10*3/uL (ref 4.0–10.5)
nRBC: 0 % (ref 0.0–0.2)

## 2018-06-30 LAB — CMP (CANCER CENTER ONLY)
ALT: 28 U/L (ref 0–44)
AST: 35 U/L (ref 15–41)
Albumin: 3.4 g/dL — ABNORMAL LOW (ref 3.5–5.0)
Alkaline Phosphatase: 161 U/L — ABNORMAL HIGH (ref 38–126)
Anion gap: 11 (ref 5–15)
BUN: 10 mg/dL (ref 8–23)
CO2: 25 mmol/L (ref 22–32)
Calcium: 8.7 mg/dL — ABNORMAL LOW (ref 8.9–10.3)
Chloride: 107 mmol/L (ref 98–111)
Creatinine: 1.08 mg/dL (ref 0.61–1.24)
GFR, Est AFR Am: >60 mL/min (ref >60)
GFR, Estimated: >60 mL/min (ref >60)
Glucose, Bld: 107 mg/dL — ABNORMAL HIGH (ref 70–99)
Potassium: 3.7 mmol/L (ref 3.5–5.1)
Sodium: 143 mmol/L (ref 135–145)
Total Bilirubin: 0.4 mg/dL (ref 0.3–1.2)
Total Protein: 6.1 g/dL — ABNORMAL LOW (ref 6.5–8.1)

## 2018-06-30 NOTE — Progress Notes (Signed)
[place] Telephone:([phone]) [phone]   Fax:(336) [phone]  OFFICE PROGRESS NOTE  DIAGNOSIS: Bulky stage IV low-grade lymphoma diagnosed in November 2009.  PRIOR THERAPY: 1. Status post 7 cycles of systemic chemotherapy with CHOP/Rituxan. Last dose was given May 2010. 2. Status post 6 cycles of maintenance Rituxan 375 mg/sq m given every 2 months. Last dose was given on May 29, 2010, and the patient was lost to followup at that time. He received another dose on 12/11/2010, then again missed 2 doses. 3. systemic chemotherapy with Rituxan 375 mg/M2 on day 1 and that bendamustine 90 mg/M2 on days 1 and 2 every 4 weeks, status post 6 cycles. 4. Maintenance Rituxan 375 mg/M2 every 2 months, status post 12 cycles  5. Rituxan 375 MG/M2 given weekly for 4 weeks and this is followed by maintenance treatment every 2 months, status post 22 cycles discontinued secondary to disease progression. 6. Systemic chemotherapy with bendamustine 90 mg/M2 on days 1 and 2 and Rituxan 375 mg/M2 on day 1 every 4 weeks.  First dose March 03, 2018.  Status post 4 cycles.  CURRENT THERAPY: Observation.  INTERVAL HISTORY: Eric [name] 78 y.o. male returns to the clinic today for follow-up visit.  The patient is feeling fine today with no concerning complaints.  He tolerated the previous 4 cycles of systemic chemotherapy with bendamustine and Rituxan fairly well except for mild fatigue.  She denied having any current chest pain, shortness of breath, cough or hemoptysis.  He denied having any fever or chills.  He has no nausea, vomiting, diarrhea or constipation.  He has no headache or visual changes.  The patient had repeat CT scan of the chest, abdomen and pelvis performed recently and he is here for evaluation and discussion of his scan results.  MEDICAL HISTORY: Past Medical History:  Diagnosis Date   Cancer [facility])    Coronary atherosclerosis of native coronary artery    Encounter for  antineoplastic chemotherapy 06/20/2015   GERD (gastroesophageal reflux disease)    History of heart attack    Hyperlipidemia    Myocardial infarction ([place])    Non Hodgkin's lymphoma ([facility])     ALLERGIES:  is allergic to neosporin [neomycin-bacitracin zn-polymyx].  MEDICATIONS:  Current Outpatient Medications  Medication Sig Dispense Refill   acetaminophen (TYLENOL) 325 MG tablet Take by mouth.     aspirin EC 81 MG tablet Take 81 mg by mouth at bedtime.     atorvastatin (LIPITOR) 40 MG tablet Take 1 tablet (40 mg total) by mouth daily at 6 PM. 90 tablet 3   diphenhydrAMINE (BENADRYL) 25 MG tablet Take 25 mg by mouth at bedtime as needed for sleep.      omeprazole (PRILOSEC) 20 MG capsule Take 20 mg by mouth daily as needed (acid relfux).     Polyethylene Glycol 3350 (PEG 3350) POWD Take by mouth.     prochlorperazine (COMPAZINE) 10 MG tablet Take 1 tablet (10 mg total) by mouth every 6 (six) hours as needed for nausea or vomiting. (Patient not taking: Reported on 05/28/2018) 30 tablet 0   sildenafil (VIAGRA) 100 MG tablet Take 100 mg by mouth daily as needed.  3   sildenafil (VIAGRA) 25 MG tablet Take 1 tablet (25 mg total) by mouth daily as needed. Take 2-3 tablets by mouth once daily as needed. 90 tablet 6   No current facility-administered medications for this visit.     SURGICAL HISTORY:  Past Surgical History:  Procedure Laterality  Date   CORONARY STENT PLACEMENT     LEFT HEART CATHETERIZATION WITH CORONARY ANGIOGRAM N/A 03/03/2012   Procedure: LEFT HEART CATHETERIZATION WITH CORONARY ANGIOGRAM;  Surgeon: [name], MD;  Location: [facility] CATH LAB;  Service: Cardiovascular;  Laterality: N/A;   PERCUTANEOUS CORONARY STENT INTERVENTION (PCI-S) N/A 03/04/2012   Procedure: PERCUTANEOUS CORONARY STENT INTERVENTION (PCI-S);  Surgeon: [name], MD;  Location: [facility] CATH LAB;  Service: Cardiovascular;  Laterality: N/A;    REVIEW OF SYSTEMS:  Constitutional: positive  for fatigue Eyes: negative Ears, nose, mouth, throat, and face: negative Respiratory: negative Cardiovascular: negative Gastrointestinal: negative Genitourinary:negative Integument/breast: negative Hematologic/lymphatic: negative Musculoskeletal:negative Neurological: negative Behavioral/Psych: negative Endocrine: negative Allergic/Immunologic: negative   PHYSICAL EXAMINATION: General appearance: alert, cooperative and no distress Head: Normocephalic, without obvious abnormality, atraumatic Neck: no adenopathy, no JVD, supple, symmetrical, trachea midline and thyroid not enlarged, symmetric, no tenderness/mass/nodules Lymph nodes: Cervical, supraclavicular, and axillary nodes normal. Resp: clear to auscultation bilaterally Back: symmetric, no curvature. ROM normal. No CVA tenderness. Cardio: regular rate and rhythm, S1, S2 normal, no murmur, click, rub or gallop GI: soft, non-tender; bowel sounds normal; no masses,  no organomegaly Extremities: extremities normal, atraumatic, no cyanosis or edema Neurologic: Alert and oriented X 3, normal strength and tone. Normal symmetric reflexes. Normal coordination and gait  ECOG PERFORMANCE STATUS: 0 - Asymptomatic  Blood pressure 132/79, pulse 80, temperature 98 F (36.7 C), temperature source Oral, resp. rate 18, height 5\' 7"  (1.702 m), weight 159 lb 12.8 oz (72.5 kg), SpO2 100 %.  LABORATORY DATA: Lab Results  Component Value Date   WBC 4.9 06/30/2018   HGB 12.4 (L) 06/30/2018   HCT 38.8 (L) 06/30/2018   MCV 91.3 06/30/2018   PLT 215 06/30/2018      Chemistry      Component Value Date/Time   NA 143 05/28/2018 1006   NA 140 12/09/2017 0859   NA 140 02/14/2017 0804   K 4.1 05/28/2018 1006   K 4.4 02/14/2017 0804   CL 108 05/28/2018 1006   CL 106 08/06/2012 1244   CO2 27 05/28/2018 1006   CO2 22 02/14/2017 0804   BUN 12 05/28/2018 1006   BUN 14 12/09/2017 0859   BUN 14.8 02/14/2017 0804   CREATININE 0.96 05/28/2018 1006     CREATININE 1.1 02/14/2017 0804      Component Value Date/Time   CALCIUM 8.3 (L) 05/28/2018 1006   CALCIUM 9.2 02/14/2017 0804   ALKPHOS 224 (H) 05/28/2018 1006   ALKPHOS 138 02/14/2017 0804   AST 39 05/28/2018 1006   AST 29 02/14/2017 0804   ALT 42 05/28/2018 1006   ALT 27 02/14/2017 0804   BILITOT 0.5 05/28/2018 1006   BILITOT 0.61 02/14/2017 0804       RADIOGRAPHIC STUDIES: Ct Chest W Contrast  Result Date: 06/23/2018 CLINICAL DATA:  Lymphoma. EXAM: CT CHEST, ABDOMEN, AND PELVIS WITH CONTRAST TECHNIQUE: Multidetector CT imaging of the chest, abdomen and pelvis was performed following the standard protocol during bolus administration of intravenous contrast. CONTRAST:  162mL OMNIPAQUE IOHEXOL 300 MG/ML  SOLN COMPARISON:  02/14/2018 FINDINGS: CT CHEST FINDINGS Cardiovascular: Aortic atherosclerosis. Tortuous thoracic aorta. Normal heart size, without pericardial effusion. Multivessel coronary artery atherosclerosis. No central pulmonary embolism, on this non-dedicated study. Mediastinum/Nodes: No supraclavicular adenopathy. Right axillary clips. No axillary adenopathy. No mediastinal or hilar adenopathy. A small to moderate hiatal hernia. Dilated, contrast filled esophagus. Lungs/Pleura: No pleural fluid. Presumed secretions in the dependent trachea. Clear lungs. Musculoskeletal: No acute osseous  abnormality. CT ABDOMEN PELVIS FINDINGS Hepatobiliary: Normal liver. Possible tiny gallstones. No acute cholecystitis or biliary duct dilatation. Pancreas: Normal, without mass or ductal dilatation. Spleen: Normal in size, without focal abnormality. Adrenals/Urinary Tract: Normal adrenal glands. Right larger than left renal cysts, with the right-sided cyst measuring 5.0 cm. Median lobe prostate impression into the urinary bladder. Stomach/Bowel: Normal remainder of the stomach. Extensive colonic diverticulosis. Normal terminal ileum and appendix. Normal small bowel. Vascular/Lymphatic: Aortic and  branch vessel atherosclerosis. Aortocaval node measures 6 mm on image 65/3 versus 10 mm on the prior. Preaortic/precaval node measures 6 mm on image 66/3 versus 11 mm on the prior. Small bowel mesenteric nodes of maximally 11 mm on image 68/3 versus 2.5 cm on the prior. No pelvic sidewall adenopathy. Reproductive: Marked prostatomegaly is not significantly changed. Other: No significant free fluid. No evidence of omental or peritoneal disease. Fat containing periumbilical hernia is small and similar. Musculoskeletal: Probable bone island in the right ischium. Disc bulge at L4-5. IMPRESSION: 1. Response to therapy of abdominal adenopathy. No evidence of new or progressive disease. 2. Hiatal hernia. Esophageal air fluid level suggests dysmotility or gastroesophageal reflux. 3. Coronary artery atherosclerosis. Aortic Atherosclerosis (ICD10-I70.0). 4. Possible cholelithiasis. 5. Marked prostatomegaly. Electronically Signed   By: Abigail Miyamoto M.D.   On: 06/23/2018 13:01   Ct Abdomen Pelvis W Contrast  Result Date: 06/23/2018 CLINICAL DATA:  Lymphoma. EXAM: CT CHEST, ABDOMEN, AND PELVIS WITH CONTRAST TECHNIQUE: Multidetector CT imaging of the chest, abdomen and pelvis was performed following the standard protocol during bolus administration of intravenous contrast. CONTRAST:  178mL OMNIPAQUE IOHEXOL 300 MG/ML  SOLN COMPARISON:  02/14/2018 FINDINGS: CT CHEST FINDINGS Cardiovascular: Aortic atherosclerosis. Tortuous thoracic aorta. Normal heart size, without pericardial effusion. Multivessel coronary artery atherosclerosis. No central pulmonary embolism, on this non-dedicated study. Mediastinum/Nodes: No supraclavicular adenopathy. Right axillary clips. No axillary adenopathy. No mediastinal or hilar adenopathy. A small to moderate hiatal hernia. Dilated, contrast filled esophagus. Lungs/Pleura: No pleural fluid. Presumed secretions in the dependent trachea. Clear lungs. Musculoskeletal: No acute osseous abnormality. CT  ABDOMEN PELVIS FINDINGS Hepatobiliary: Normal liver. Possible tiny gallstones. No acute cholecystitis or biliary duct dilatation. Pancreas: Normal, without mass or ductal dilatation. Spleen: Normal in size, without focal abnormality. Adrenals/Urinary Tract: Normal adrenal glands. Right larger than left renal cysts, with the right-sided cyst measuring 5.0 cm. Median lobe prostate impression into the urinary bladder. Stomach/Bowel: Normal remainder of the stomach. Extensive colonic diverticulosis. Normal terminal ileum and appendix. Normal small bowel. Vascular/Lymphatic: Aortic and branch vessel atherosclerosis. Aortocaval node measures 6 mm on image 65/3 versus 10 mm on the prior. Preaortic/precaval node measures 6 mm on image 66/3 versus 11 mm on the prior. Small bowel mesenteric nodes of maximally 11 mm on image 68/3 versus 2.5 cm on the prior. No pelvic sidewall adenopathy. Reproductive: Marked prostatomegaly is not significantly changed. Other: No significant free fluid. No evidence of omental or peritoneal disease. Fat containing periumbilical hernia is small and similar. Musculoskeletal: Probable bone island in the right ischium. Disc bulge at L4-5. IMPRESSION: 1. Response to therapy of abdominal adenopathy. No evidence of new or progressive disease. 2. Hiatal hernia. Esophageal air fluid level suggests dysmotility or gastroesophageal reflux. 3. Coronary artery atherosclerosis. Aortic Atherosclerosis (ICD10-I70.0). 4. Possible cholelithiasis. 5. Marked prostatomegaly. Electronically Signed   By: Abigail Miyamoto M.D.   On: 06/23/2018 13:01    ASSESSMENT AND PLAN:  This is a very pleasant 78 years old African-American male with bulky stage IV follicular lymphoma initially treated with  CHOP/Rituxan followed by maintenance Rituxan. The patient had disease recurrence and he was restarted again on treatment with Rituxan every 2 months status post 22 cycles. The patient has been tolerating this treatment well with  no concerning adverse effects. The patient was found to have progression in December 2019.  He was started on systemic chemotherapy with bendamustine and Rituxan status post 4 cycles.  He tolerated the fourth cycle of his treatment well except for mild fatigue. He had repeat CT scan of the chest, abdomen and pelvis performed recently.  I personally and independently reviewed the scans and discussed the results with the patient today. His scan showed significant improvement of his disease with almost complete resolution of the abdominal lymphadenopathy. I recommended for the patient to continue on observation with repeat CT scan of the chest, abdomen and pelvis in 6 months. The patient was advised to call immediately if he has any concerning symptoms in the interval. The patient voices understanding of current disease status and treatment options and is in agreement with the current care plan. All questions were answered. The patient knows to call the clinic with any problems, questions or concerns. We can certainly see the patient much sooner if necessary.   Disclaimer: This note was dictated with voice recognition software. Similar sounding words can inadvertently be transcribed and may not be corrected upon review.

## 2018-07-01 ENCOUNTER — Telehealth: Payer: Self-pay | Admitting: Internal Medicine

## 2018-07-01 NOTE — Telephone Encounter (Signed)
Called regarding schedule °

## 2018-12-26 ENCOUNTER — Inpatient Hospital Stay: Payer: Medicare Other | Attending: Internal Medicine

## 2018-12-26 ENCOUNTER — Encounter (HOSPITAL_COMMUNITY): Payer: Self-pay

## 2018-12-26 ENCOUNTER — Other Ambulatory Visit: Payer: Self-pay

## 2018-12-26 ENCOUNTER — Ambulatory Visit (HOSPITAL_COMMUNITY)
Admission: RE | Admit: 2018-12-26 | Discharge: 2018-12-26 | Disposition: A | Discharge status: Home / Self Care | Payer: Medicare Other | Source: Ambulatory Visit | Attending: Internal Medicine | Admitting: Internal Medicine

## 2018-12-26 DIAGNOSIS — N281 Cyst of kidney, acquired: Secondary | ICD-10-CM | POA: Diagnosis not present

## 2018-12-26 DIAGNOSIS — C8288 Other types of follicular lymphoma, lymph nodes of multiple sites: Secondary | ICD-10-CM | POA: Insufficient documentation

## 2018-12-26 DIAGNOSIS — I700 Atherosclerosis of aorta: Secondary | ICD-10-CM | POA: Diagnosis not present

## 2018-12-26 DIAGNOSIS — K5730 Diverticulosis of large intestine without perforation or abscess without bleeding: Secondary | ICD-10-CM | POA: Insufficient documentation

## 2018-12-26 DIAGNOSIS — C8590 Non-Hodgkin lymphoma, unspecified, unspecified site: Secondary | ICD-10-CM | POA: Diagnosis not present

## 2018-12-26 DIAGNOSIS — K449 Diaphragmatic hernia without obstruction or gangrene: Secondary | ICD-10-CM | POA: Diagnosis not present

## 2018-12-26 DIAGNOSIS — I252 Old myocardial infarction: Secondary | ICD-10-CM | POA: Insufficient documentation

## 2018-12-26 DIAGNOSIS — I2510 Atherosclerotic heart disease of native coronary artery without angina pectoris: Secondary | ICD-10-CM | POA: Diagnosis not present

## 2018-12-26 DIAGNOSIS — K429 Umbilical hernia without obstruction or gangrene: Secondary | ICD-10-CM | POA: Insufficient documentation

## 2018-12-26 DIAGNOSIS — C8100 Nodular lymphocyte predominant Hodgkin lymphoma, unspecified site: Secondary | ICD-10-CM | POA: Insufficient documentation

## 2018-12-26 DIAGNOSIS — N400 Enlarged prostate without lower urinary tract symptoms: Secondary | ICD-10-CM | POA: Insufficient documentation

## 2018-12-26 DIAGNOSIS — Z79899 Other long term (current) drug therapy: Secondary | ICD-10-CM | POA: Diagnosis not present

## 2018-12-26 DIAGNOSIS — M5136 Other intervertebral disc degeneration, lumbar region: Secondary | ICD-10-CM | POA: Diagnosis not present

## 2018-12-26 LAB — CBC WITH DIFFERENTIAL (CANCER CENTER ONLY)
Abs Immature Granulocytes: 0.13 10*3/uL — ABNORMAL HIGH (ref 0.00–0.07)
Basophils Absolute: 0 10*3/uL (ref 0.0–0.1)
Basophils Relative: 1 %
Eosinophils Absolute: 0.2 10*3/uL (ref 0.0–0.5)
Eosinophils Relative: 5 %
HCT: 38.1 % — ABNORMAL LOW (ref 39.0–52.0)
Hemoglobin: 12.6 g/dL — ABNORMAL LOW (ref 13.0–17.0)
Immature Granulocytes: 4 %
Lymphocytes Relative: 28 %
Lymphs Abs: 0.9 10*3/uL (ref 0.7–4.0)
MCH: 28.8 pg (ref 26.0–34.0)
MCHC: 33.1 g/dL (ref 30.0–36.0)
MCV: 87 fL (ref 80.0–100.0)
Monocytes Absolute: 0.8 10*3/uL (ref 0.1–1.0)
Monocytes Relative: 24 %
Neutro Abs: 1.3 10*3/uL — ABNORMAL LOW (ref 1.7–7.7)
Neutrophils Relative %: 38 %
Platelet Count: 232 10*3/uL (ref 150–400)
RBC: 4.38 MIL/uL (ref 4.22–5.81)
RDW: 15.1 % (ref 11.5–15.5)
WBC Count: 3.3 10*3/uL — ABNORMAL LOW (ref 4.0–10.5)
nRBC: 0 % (ref 0.0–0.2)

## 2018-12-26 LAB — CMP (CANCER CENTER ONLY)
ALT: 21 U/L (ref 0–44)
AST: 28 U/L (ref 15–41)
Albumin: 3.7 g/dL (ref 3.5–5.0)
Alkaline Phosphatase: 145 U/L — ABNORMAL HIGH (ref 38–126)
Anion gap: 10 (ref 5–15)
BUN: 11 mg/dL (ref 8–23)
CO2: 27 mmol/L (ref 22–32)
Calcium: 8.7 mg/dL — ABNORMAL LOW (ref 8.9–10.3)
Chloride: 106 mmol/L (ref 98–111)
Creatinine: 1 mg/dL (ref 0.61–1.24)
GFR, Est AFR Am: >60 mL/min (ref >60)
GFR, Estimated: >60 mL/min (ref >60)
Glucose, Bld: 80 mg/dL (ref 70–99)
Potassium: 3.7 mmol/L (ref 3.5–5.1)
Sodium: 143 mmol/L (ref 135–145)
Total Bilirubin: 0.5 mg/dL (ref 0.3–1.2)
Total Protein: 5.9 g/dL — ABNORMAL LOW (ref 6.5–8.1)

## 2018-12-26 LAB — LACTATE DEHYDROGENASE: LDH: 276 U/L — ABNORMAL HIGH (ref 98–192)

## 2018-12-26 MED ORDER — IOHEXOL 300 MG/ML  SOLN
100.0000 mL | Freq: Once | INTRAMUSCULAR | Status: AC | PRN
  Administered 2018-12-26: 13:00:00 100 mL via INTRAVENOUS

## 2018-12-26 MED ORDER — SODIUM CHLORIDE (PF) 0.9 % IJ SOLN
INTRAMUSCULAR | Status: AC
  Filled 2018-12-26: qty 50

## 2018-12-30 ENCOUNTER — Inpatient Hospital Stay: Payer: Medicare Other | Admitting: Internal Medicine

## 2018-12-30 ENCOUNTER — Encounter: Payer: Self-pay | Admitting: Internal Medicine

## 2018-12-30 ENCOUNTER — Other Ambulatory Visit: Payer: Self-pay

## 2018-12-30 VITALS — BP 147/94 | HR 68 | Temp 98.7°F | Resp 18 | Ht 67.0 in | Wt 159.5 lb

## 2018-12-30 DIAGNOSIS — K449 Diaphragmatic hernia without obstruction or gangrene: Secondary | ICD-10-CM | POA: Diagnosis not present

## 2018-12-30 DIAGNOSIS — K5730 Diverticulosis of large intestine without perforation or abscess without bleeding: Secondary | ICD-10-CM | POA: Diagnosis not present

## 2018-12-30 DIAGNOSIS — I700 Atherosclerosis of aorta: Secondary | ICD-10-CM | POA: Diagnosis not present

## 2018-12-30 DIAGNOSIS — N400 Enlarged prostate without lower urinary tract symptoms: Secondary | ICD-10-CM | POA: Diagnosis not present

## 2018-12-30 DIAGNOSIS — Z79899 Other long term (current) drug therapy: Secondary | ICD-10-CM | POA: Diagnosis not present

## 2018-12-30 DIAGNOSIS — C8208 Follicular lymphoma grade I, lymph nodes of multiple sites: Secondary | ICD-10-CM

## 2018-12-30 DIAGNOSIS — I2510 Atherosclerotic heart disease of native coronary artery without angina pectoris: Secondary | ICD-10-CM | POA: Diagnosis not present

## 2018-12-30 DIAGNOSIS — K429 Umbilical hernia without obstruction or gangrene: Secondary | ICD-10-CM | POA: Diagnosis not present

## 2018-12-30 DIAGNOSIS — C8100 Nodular lymphocyte predominant Hodgkin lymphoma, unspecified site: Secondary | ICD-10-CM | POA: Diagnosis not present

## 2018-12-30 DIAGNOSIS — M5136 Other intervertebral disc degeneration, lumbar region: Secondary | ICD-10-CM | POA: Diagnosis not present

## 2018-12-30 DIAGNOSIS — I252 Old myocardial infarction: Secondary | ICD-10-CM | POA: Diagnosis not present

## 2018-12-30 DIAGNOSIS — C8288 Other types of follicular lymphoma, lymph nodes of multiple sites: Secondary | ICD-10-CM | POA: Diagnosis not present

## 2018-12-30 DIAGNOSIS — N281 Cyst of kidney, acquired: Secondary | ICD-10-CM | POA: Diagnosis not present

## 2018-12-30 NOTE — Progress Notes (Signed)
[place] [phone]   [phone]  OFFICE PROGRESS NOTE  DIAGNOSIS: Bulky stage IV low-grade lymphoma diagnosed in November 2009.  PRIOR THERAPY: 1. Status post 7 cycles of systemic chemotherapy with CHOP/Rituxan. Last dose was given May 2010. 2. Status post 6 cycles of maintenance Rituxan 375 mg/sq m given every 2 months. Last dose was given on May 29, 2010, and the patient was lost to followup at that time. He received another dose on 12/11/2010, then again missed 2 doses. 3. systemic chemotherapy with Rituxan 375 mg/M2 on day 1 and that bendamustine 90 mg/M2 on days 1 and 2 every 4 weeks, status post 6 cycles. 4. Maintenance Rituxan 375 mg/M2 every 2 months, status post 12 cycles  5. Rituxan 375 MG/M2 given weekly for 4 weeks and this is followed by maintenance treatment every 2 months, status post 22 cycles discontinued secondary to disease progression. 6. Systemic chemotherapy with bendamustine 90 mg/M2 on days 1 and 2 and Rituxan 375 mg/M2 on day 1 every 4 weeks.  First dose March 03, 2018.  Status post 4 cycles.  CURRENT THERAPY: Observation.  INTERVAL HISTORY: Eric [name] 78 y.o. male returns to the clinic today for follow-up visit.  The patient is feeling fine today with no concerning complaints.  He denied having any chest pain, shortness of breath, cough or hemoptysis.  He denied having any fever or chills.  He has no nausea, vomiting, diarrhea or constipation.  He has no recent weight loss or night sweats.  He is currently on observation.  He had repeat CT scan of the chest, abdomen pelvis performed recently and he is here for evaluation and discussion of his discuss results.  MEDICAL HISTORY: Past Medical History:  Diagnosis Date   Cancer [facility])    Coronary atherosclerosis of native coronary artery    Encounter for antineoplastic chemotherapy 06/20/2015   GERD (gastroesophageal reflux disease)    History of heart attack      Hyperlipidemia    Myocardial infarction ([place])    Non Hodgkin's lymphoma (HCC)     ALLERGIES:  is allergic to neosporin [neomycin-bacitracin zn-polymyx].  MEDICATIONS:  Current Outpatient Medications  Medication Sig Dispense Refill   acetaminophen (TYLENOL) 325 MG tablet Take by mouth.     aspirin EC 81 MG tablet Take 81 mg by mouth at bedtime.     atorvastatin (LIPITOR) 40 MG tablet Take 1 tablet (40 mg total) by mouth daily at 6 PM. 90 tablet 3   diphenhydrAMINE (BENADRYL) 25 MG tablet Take 25 mg by mouth at bedtime as needed for sleep.      omeprazole (PRILOSEC) 20 MG capsule Take 20 mg by mouth daily as needed (acid relfux).     Polyethylene Glycol 3350 (PEG 3350) POWD Take by mouth.     prochlorperazine (COMPAZINE) 10 MG tablet Take 1 tablet (10 mg total) by mouth every 6 (six) hours as needed for nausea or vomiting. (Patient not taking: Reported on 05/28/2018) 30 tablet 0   sildenafil (VIAGRA) 100 MG tablet Take 100 mg by mouth daily as needed.  3   sildenafil (VIAGRA) 25 MG tablet Take 1 tablet (25 mg total) by mouth daily as needed. Take 2-3 tablets by mouth once daily as needed. 90 tablet 6   No current facility-administered medications for this visit.     SURGICAL HISTORY:  Past Surgical History:  Procedure Laterality Date   CORONARY STENT PLACEMENT     LEFT HEART CATHETERIZATION WITH  CORONARY ANGIOGRAM N/A 03/03/2012   Procedure: LEFT HEART CATHETERIZATION WITH CORONARY ANGIOGRAM;  Surgeon: [name], MD;  Location: [facility] CATH LAB;  Service: Cardiovascular;  Laterality: N/A;   PERCUTANEOUS CORONARY STENT INTERVENTION (PCI-S) N/A 03/04/2012   Procedure: PERCUTANEOUS CORONARY STENT INTERVENTION (PCI-S);  Surgeon: [name], MD;  Location: [facility] CATH LAB;  Service: Cardiovascular;  Laterality: N/A;    REVIEW OF SYSTEMS:  A comprehensive review of systems was negative.   PHYSICAL EXAMINATION: General appearance: alert, cooperative and no distress Head:  Normocephalic, without obvious abnormality, atraumatic Neck: no adenopathy, no JVD, supple, symmetrical, trachea midline and thyroid not enlarged, symmetric, no tenderness/mass/nodules Lymph nodes: Cervical, supraclavicular, and axillary nodes normal. Resp: clear to auscultation bilaterally Back: symmetric, no curvature. ROM normal. No CVA tenderness. Cardio: regular rate and rhythm, S1, S2 normal, no murmur, click, rub or gallop GI: soft, non-tender; bowel sounds normal; no masses,  no organomegaly Extremities: extremities normal, atraumatic, no cyanosis or edema  ECOG PERFORMANCE STATUS: 0 - Asymptomatic  Blood pressure (!) 147/94, pulse 68, temperature 98.7 F (37.1 C), temperature source Temporal, resp. rate 18, height 5\' 7"  (1.702 m), weight 159 lb 8 oz (72.3 kg), SpO2 100 %.  LABORATORY DATA: Lab Results  Component Value Date   WBC 3.3 (L) 12/26/2018   HGB 12.6 (L) 12/26/2018   HCT 38.1 (L) 12/26/2018   MCV 87.0 12/26/2018   PLT 232 12/26/2018      Chemistry      Component Value Date/Time   NA 143 12/26/2018 1155   NA 140 12/09/2017 0859   NA 140 02/14/2017 0804   K 3.7 12/26/2018 1155   K 4.4 02/14/2017 0804   CL 106 12/26/2018 1155   CL 106 08/06/2012 1244   CO2 27 12/26/2018 1155   CO2 22 02/14/2017 0804   BUN 11 12/26/2018 1155   BUN 14 12/09/2017 0859   BUN 14.8 02/14/2017 0804   CREATININE 1.00 12/26/2018 1155   CREATININE 1.1 02/14/2017 0804      Component Value Date/Time   CALCIUM 8.7 (L) 12/26/2018 1155   CALCIUM 9.2 02/14/2017 0804   ALKPHOS 145 (H) 12/26/2018 1155   ALKPHOS 138 02/14/2017 0804   AST 28 12/26/2018 1155   AST 29 02/14/2017 0804   ALT 21 12/26/2018 1155   ALT 27 02/14/2017 0804   BILITOT 0.5 12/26/2018 1155   BILITOT 0.61 02/14/2017 0804       RADIOGRAPHIC STUDIES: Ct Chest W Contrast  Result Date: 12/26/2018 CLINICAL DATA:  Non-Hodgkin's lymphoma restaging EXAM: CT CHEST, ABDOMEN, AND PELVIS WITH CONTRAST TECHNIQUE:  Multidetector CT imaging of the chest, abdomen and pelvis was performed following the standard protocol during bolus administration of intravenous contrast. CONTRAST:  11mL OMNIPAQUE IOHEXOL 300 MG/ML  SOLN COMPARISON:  06/23/2018 FINDINGS: CT CHEST FINDINGS Cardiovascular: Coronary, aortic arch, and branch vessel atherosclerotic vascular disease. Mediastinum/Nodes: Moderate-sized hiatal hernia. Distal esophageal wall thickening, probably from esophagitis. Right axillary clips from prior node dissection. No pathologic adenopathy in the chest. Lungs/Pleura: Unremarkable Musculoskeletal: Stable small sclerotic lesion anterolaterally in the right seventh rib, no change from that described on 02/06/2008, previously not hypermetabolic on PET-CT examinations. Considered benign. CT ABDOMEN PELVIS FINDINGS Hepatobiliary: Unremarkable Pancreas: Unremarkable Spleen: Unremarkable Adrenals/Urinary Tract: Adrenal glands normal. Stable renal cysts. Massively enlarged prostate gland indents the bladder base. Stomach/Bowel: Moderate-sized hiatal hernia. Prominent stool throughout the colon favors constipation. Sigmoid colon diverticulosis with scattered diverticula of the descending colon. Vascular/Lymphatic: Aortoiliac atherosclerotic vascular disease. Left paracentral mesenteric lymph node  0.7 cm in short axis on image 67/2, previously 2.5 cm. Aortocaval node 0.3 cm in short axis on image 64/2, previously 1.1 cm. No new adenopathy. Reproductive: The prostate gland measures 8.5 by 7.1 by 10.8 cm (volume = 340 cm^3), markedly enlarged. Other: No supplemental non-categorized findings. Musculoskeletal: Small umbilical hernia contains adipose tissue. Mild lower lumbar spondylosis and degenerative disc disease. IMPRESSION: 1. Significant further reduction in adenopathy in the mesentery and retroperitoneum, no currently enlarged lymph nodes identified. 2. Stable massive enlargement the prostate gland, estimated at 340 cubic cm in  volume. 3. Moderate-sized hiatal hernia. Distal esophageal wall thickening is probably from esophagitis. 4.  Aortic Atherosclerosis (ICD10-I70.0).  Coronary atherosclerosis. 5.  Prominent stool throughout the colon favors constipation. 6. Sigmoid colon diverticulosis. Electronically Signed   By: Van Clines M.D.   On: 12/26/2018 13:57   Ct Abdomen Pelvis W Contrast  Result Date: 12/26/2018 CLINICAL DATA:  Non-Hodgkin's lymphoma restaging EXAM: CT CHEST, ABDOMEN, AND PELVIS WITH CONTRAST TECHNIQUE: Multidetector CT imaging of the chest, abdomen and pelvis was performed following the standard protocol during bolus administration of intravenous contrast. CONTRAST:  127mL OMNIPAQUE IOHEXOL 300 MG/ML  SOLN COMPARISON:  06/23/2018 FINDINGS: CT CHEST FINDINGS Cardiovascular: Coronary, aortic arch, and branch vessel atherosclerotic vascular disease. Mediastinum/Nodes: Moderate-sized hiatal hernia. Distal esophageal wall thickening, probably from esophagitis. Right axillary clips from prior node dissection. No pathologic adenopathy in the chest. Lungs/Pleura: Unremarkable Musculoskeletal: Stable small sclerotic lesion anterolaterally in the right seventh rib, no change from that described on 02/06/2008, previously not hypermetabolic on PET-CT examinations. Considered benign. CT ABDOMEN PELVIS FINDINGS Hepatobiliary: Unremarkable Pancreas: Unremarkable Spleen: Unremarkable Adrenals/Urinary Tract: Adrenal glands normal. Stable renal cysts. Massively enlarged prostate gland indents the bladder base. Stomach/Bowel: Moderate-sized hiatal hernia. Prominent stool throughout the colon favors constipation. Sigmoid colon diverticulosis with scattered diverticula of the descending colon. Vascular/Lymphatic: Aortoiliac atherosclerotic vascular disease. Left paracentral mesenteric lymph node 0.7 cm in short axis on image 67/2, previously 2.5 cm. Aortocaval node 0.3 cm in short axis on image 64/2, previously 1.1 cm. No new  adenopathy. Reproductive: The prostate gland measures 8.5 by 7.1 by 10.8 cm (volume = 340 cm^3), markedly enlarged. Other: No supplemental non-categorized findings. Musculoskeletal: Small umbilical hernia contains adipose tissue. Mild lower lumbar spondylosis and degenerative disc disease. IMPRESSION: 1. Significant further reduction in adenopathy in the mesentery and retroperitoneum, no currently enlarged lymph nodes identified. 2. Stable massive enlargement the prostate gland, estimated at 340 cubic cm in volume. 3. Moderate-sized hiatal hernia. Distal esophageal wall thickening is probably from esophagitis. 4.  Aortic Atherosclerosis (ICD10-I70.0).  Coronary atherosclerosis. 5.  Prominent stool throughout the colon favors constipation. 6. Sigmoid colon diverticulosis. Electronically Signed   By: Van Clines M.D.   On: 12/26/2018 13:57    ASSESSMENT AND PLAN:  This is a very pleasant 78 years old African-American male with bulky stage IV follicular lymphoma initially treated with CHOP/Rituxan followed by maintenance Rituxan. The patient had disease recurrence and he was restarted again on treatment with Rituxan every 2 months status post 22 cycles. The patient has been tolerating this treatment well with no concerning adverse effects. The patient was found to have progression in December 2019.  He was started on systemic chemotherapy with bendamustine and Rituxan status post 4 cycles.  He tolerated the fourth cycle of his treatment well except for mild fatigue. The patient is currently on observation and he is feeling fine. He had repeat CT scan of the chest, abdomen pelvis performed recently.  I personally  and independently reviewed the scans and discussed the results with the patient today. His scan showed further improvement of his disease. I recommended for him to continue on observation with repeat blood work in 6 months. He was advised to call immediately if he has any concerning symptoms  in the interval. The patient voices understanding of current disease status and treatment options and is in agreement with the current care plan. All questions were answered. The patient knows to call the clinic with any problems, questions or concerns. We can certainly see the patient much sooner if necessary.   Disclaimer: This note was dictated with voice recognition software. Similar sounding words can inadvertently be transcribed and may not be corrected upon review.

## 2018-12-31 ENCOUNTER — Telehealth: Payer: Self-pay | Admitting: Internal Medicine

## 2018-12-31 NOTE — Telephone Encounter (Signed)
Scheduled appt per 10/27 los.  Spoke with pt and he is aware of his appt date and time.

## 2019-01-07 ENCOUNTER — Other Ambulatory Visit: Payer: Self-pay | Admitting: Interventional Cardiology

## 2019-01-24 NOTE — Progress Notes (Signed)
Cardiology Office Note:    Date:  01/26/2019   ID:  Eric [name], DOB April 12, 1940, MRN [id number]  PCP:  Patient, No Pcp Per  Cardiologist:  [name], MD   Referring MD: No ref. provider found   Chief Complaint  Patient presents with  . Coronary Artery Disease  . Hypertension    History of Present Illness:    Eric [name] is a 78 y.o. male with a hx of follicular lymphoma, CAD with RCA DES 2013, prior MI, and hyperlipidemia.   He is doing well.  He has no cardiac symptoms.  He denies syncope chest pain and orthopnea.  He is not exercising because the gyms are closed.  His appetite has been stable.  Recent checkup for his lymphoma was unremarkable.  Past Medical History:  Diagnosis Date  . Cancer ([place])   . Coronary atherosclerosis of native coronary artery   . Encounter for antineoplastic chemotherapy 06/20/2015  . GERD (gastroesophageal reflux disease)   . History of heart attack   . Hyperlipidemia   . Myocardial infarction ([place])   . Non Hodgkin's lymphoma [facility])     Past Surgical History:  Procedure Laterality Date  . CORONARY STENT PLACEMENT    . LEFT HEART CATHETERIZATION WITH CORONARY ANGIOGRAM N/A 03/03/2012   Procedure: LEFT HEART CATHETERIZATION WITH CORONARY ANGIOGRAM;  Surgeon: [name], MD;  Location: [facility] CATH LAB;  Service: Cardiovascular;  Laterality: N/A;  . PERCUTANEOUS CORONARY STENT INTERVENTION (PCI-S) N/A 03/04/2012   Procedure: PERCUTANEOUS CORONARY STENT INTERVENTION (PCI-S);  Surgeon: [name], MD;  Location: [facility] CATH LAB;  Service: Cardiovascular;  Laterality: N/A;    Current Medications: Current Meds  Medication Sig  . aspirin EC 81 MG tablet Take 81 mg by mouth at bedtime.  Marland Kitchen atorvastatin (LIPITOR) 40 MG tablet TAKE 1 TABLET BY MOUTH DAILY AT 6 PM  . diphenhydrAMINE (BENADRYL) 25 MG tablet Take 25 mg by mouth at bedtime as needed for sleep.   Marland Kitchen omeprazole (PRILOSEC) 20 MG capsule Take 20 mg by mouth daily as needed (acid  relfux).  . Polyethylene Glycol 3350 (PEG 3350) POWD Take by mouth.  . prochlorperazine (COMPAZINE) 10 MG tablet Take 1 tablet (10 mg total) by mouth every 6 (six) hours as needed for nausea or vomiting.  . sildenafil (VIAGRA) 100 MG tablet Take 100 mg by mouth daily as needed.     Allergies:   Neosporin [neomycin-bacitracin zn-polymyx]   Social History   Socioeconomic History  . Marital status: Married    Spouse name: Not on file  . Number of children: Not on file  . Years of education: Not on file  . Highest education level: Not on file  Occupational History  . Occupation: Software engineer  Social Needs  . Financial resource strain: Not on file  . Food insecurity    Worry: Not on file    Inability: Not on file  . Transportation needs    Medical: Not on file    Non-medical: Not on file  Tobacco Use  . Smoking status: Never Smoker  . Smokeless tobacco: Never Used  Substance and Sexual Activity  . Alcohol use: Yes    Comment: occasionally  . Drug use: No  . Sexual activity: Yes  Lifestyle  . Physical activity    Days per week: Not on file    Minutes per session: Not on file  . Stress: Not on file  Relationships  . Social Herbalist on phone: Not  on file    Gets together: Not on file    Attends religious service: Not on file    Active member of club or organization: Not on file    Attends meetings of clubs or organizations: Not on file    Relationship status: Not on file  Other Topics Concern  . 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       Tobacco: never      Alcohol:  Socially; almost none in 2017      Exercise in door track, cardio machine and treadmill 1-2 times per week       ADLs: independent with ADLs;       Advanced Directives: no; desires FULL CODE.      Family History: The patient's family history includes Leukemia in his mother.  ROS:   Please see the history of present illness.    Difficulty sleeping.  Not exercising.  All other systems reviewed and are negative.  EKGs/Labs/Other Studies Reviewed:    The following studies were reviewed today: No new data.  EKG:  EKG normal sinus rhythm, normal appearance.  Recent Labs: 12/26/2018: ALT 21; BUN 11; Creatinine 1.00; Hemoglobin 12.6; Platelet Count 232; Potassium 3.7; Sodium 143  Recent Lipid Panel    Component Value Date/Time   CHOL 138 12/09/2017 0859   TRIG 44 12/09/2017 0859   HDL 59 12/09/2017 0859   CHOLHDL 2.3 12/09/2017 0859   CHOLHDL 3.0 01/05/2015 1440   VLDL 15 01/05/2015 1440   LDLCALC 70 12/09/2017 0859    Physical Exam:    VS:  BP (!) 148/88   Pulse 69   Ht 5\' 7"  (1.702 m)   Wt 164 lb (74.4 kg)   SpO2 98%   BMI 25.69 kg/m     Wt Readings from Last 3 Encounters:  01/26/19 164 lb (74.4 kg)  12/30/18 159 lb 8 oz (72.3 kg)  06/30/18 159 lb 12.8 oz (72.5 kg)     GEN: Healthy appearing. No acute distress HEENT: Normal NECK: No JVD. LYMPHATICS: No lymphadenopathy CARDIAC:  RRR without murmur, gallop, or edema. VASCULAR:  Normal Pulses. No bruits. RESPIRATORY:  Clear to auscultation without rales, wheezing or rhonchi  ABDOMEN: Soft, non-tender, non-distended, No pulsatile mass, MUSCULOSKELETAL: No deformity  SKIN: Warm and dry NEUROLOGIC:  Alert and oriented x 3 PSYCHIATRIC:  Normal affect   ASSESSMENT:    1. CAD in native artery   2. Mixed hyperlipidemia   3. Educated about COVID-19 virus infection    PLAN:    In order of problems listed above:  1. Secondary prevention discussed 2. Most recent LDL was 1 year ago.  This will be performed and several weeks when he returns for blood pressure check. 3. Elevated blood pressure.  Start losartan 50 mg daily.   In 2 to 4 weeks, will have appointment with APP for blood pressure clinic.  On the same visit a basic metabolic panel and lipid panel will be obtained.  Target blood pressure 130/80 mmHg. 4. The 3 W's are discussed and are being practiced.  Overall education and awareness concerning primary/secondary risk prevention was discussed in detail: LDL less than 70, hemoglobin A1c  less than 7, blood pressure target less than 130/80 mmHg, >150 minutes of moderate aerobic activity per week, avoidance of smoking, weight control (via diet and exercise), and continued surveillance/management of/for obstructive sleep apnea.   Medication Adjustments/Labs and Tests Ordered: Current medicines are reviewed at length with the patient today.  Concerns regarding medicines are outlined above.  Orders Placed This Encounter  Procedures  . Basic metabolic panel  . Lipid panel   Meds ordered this encounter  Medications  . losartan (COZAAR) 50 MG tablet    Sig: Take 1 tablet (50 mg total) by mouth daily.    Dispense:  90 tablet    Refill:  3    Patient Instructions  Medication Instructions:  1) START Losartan 50mg  once daily  *If you need a refill on your cardiac medications before your next appointment, please call your pharmacy*  Lab Work: BMET and Lipid panel when you come in for next visit. You will need to be fasting for these labs (nothing to eat or drink after midnight except water and black coffee). If you have labs (blood work) drawn today and your tests are completely normal, you will receive your results only by: Marland Kitchen MyChart Message (if you have MyChart) OR . A paper copy in the mail If you have any lab test that is abnormal or we need to change your treatment, we will call you to review the results.  Testing/Procedures: None  Follow-Up:  Your physician recommends that you schedule a follow-up appointment in: 3 weeks with a PA, NP or Hypertension Clinic.   At [facility], you and your  health needs are our priority.  As part of our continuing mission to provide you with exceptional heart care, we have created designated Provider Care Teams.  These Care Teams include your primary Cardiologist (physician) and Advanced Practice Providers (APPs -  Physician Assistants and Nurse Practitioners) who all work together to provide you with the care you need, when you need it.  Your next appointment:   12 month(s)  The format for your next appointment:   In Person  Provider:   You may see [name], MD  or one of the following Advanced Practice Providers on your designated Care Team:    Truitt Merle, NP  Cecilie Kicks, NP  Kathyrn Drown, NP   Other Instructions      Signed, [name], MD  01/26/2019 9:47 AM    [place]

## 2019-01-26 ENCOUNTER — Encounter: Payer: Self-pay | Admitting: Interventional Cardiology

## 2019-01-26 ENCOUNTER — Encounter (INDEPENDENT_AMBULATORY_CARE_PROVIDER_SITE_OTHER): Payer: Self-pay

## 2019-01-26 ENCOUNTER — Ambulatory Visit: Payer: Medicare Other | Admitting: Interventional Cardiology

## 2019-01-26 ENCOUNTER — Other Ambulatory Visit: Payer: Self-pay

## 2019-01-26 VITALS — BP 148/88 | HR 69 | Ht 67.0 in | Wt 164.0 lb

## 2019-01-26 DIAGNOSIS — Z7189 Other specified counseling: Secondary | ICD-10-CM | POA: Diagnosis not present

## 2019-01-26 DIAGNOSIS — E782 Mixed hyperlipidemia: Secondary | ICD-10-CM | POA: Diagnosis not present

## 2019-01-26 DIAGNOSIS — I2510 Atherosclerotic heart disease of native coronary artery without angina pectoris: Secondary | ICD-10-CM

## 2019-01-26 MED ORDER — LOSARTAN POTASSIUM 50 MG PO TABS: 50.0000 mg | ORAL_TABLET | Freq: Every day | ORAL | 3 refills | Status: DC

## 2019-01-26 NOTE — Patient Instructions (Signed)
Medication Instructions:  1) START Losartan 50mg  once daily  *If you need a refill on your cardiac medications before your next appointment, please call your pharmacy*  Lab Work: BMET and Lipid panel when you come in for next visit. You will need to be fasting for these labs (nothing to eat or drink after midnight except water and black coffee). If you have labs (blood work) drawn today and your tests are completely normal, you will receive your results only by: Marland Kitchen MyChart Message (if you have MyChart) OR . A paper copy in the mail If you have any lab test that is abnormal or we need to change your treatment, we will call you to review the results.  Testing/Procedures: None  Follow-Up:  Your physician recommends that you schedule a follow-up appointment in: 3 weeks with a PA, NP or Hypertension Clinic.   At [facility], you and your health needs are our priority.  As part of our continuing mission to provide you with exceptional heart care, we have created designated Provider Care Teams.  These Care Teams include your primary Cardiologist (physician) and Advanced Practice Providers (APPs -  Physician Assistants and Nurse Practitioners) who all work together to provide you with the care you need, when you need it.  Your next appointment:   12 month(s)  The format for your next appointment:   In Person  Provider:   You may see [name], MD  or one of the following Advanced Practice Providers on your designated Care Team:    Truitt Merle, NP  Cecilie Kicks, NP  Kathyrn Drown, NP   Other Instructions

## 2019-01-26 NOTE — Addendum Note (Signed)
Addended by: Carylon Perches on: 01/26/2019 04:23 PM   Modules accepted: Orders

## 2019-02-16 ENCOUNTER — Other Ambulatory Visit: Payer: Medicare Other | Admitting: *Deleted

## 2019-02-16 ENCOUNTER — Other Ambulatory Visit: Payer: Self-pay

## 2019-02-16 DIAGNOSIS — E782 Mixed hyperlipidemia: Secondary | ICD-10-CM

## 2019-02-16 DIAGNOSIS — I2510 Atherosclerotic heart disease of native coronary artery without angina pectoris: Secondary | ICD-10-CM | POA: Diagnosis not present

## 2019-02-16 LAB — LIPID PANEL
Chol/HDL Ratio: 2.7 ratio (ref 0.0–5.0)
Cholesterol, Total: 121 mg/dL (ref 100–199)
HDL: 45 mg/dL (ref >39)
LDL Chol Calc (NIH): 65 mg/dL (ref 0–99)
Triglycerides: 49 mg/dL (ref 0–149)
VLDL Cholesterol Cal: 11 mg/dL (ref 5–40)

## 2019-02-16 LAB — BASIC METABOLIC PANEL
BUN/Creatinine Ratio: 13 (ref 10–24)
BUN: 15 mg/dL (ref 8–27)
CO2: 24 mmol/L (ref 20–29)
Calcium: 9.1 mg/dL (ref 8.6–10.2)
Chloride: 105 mmol/L (ref 96–106)
Creatinine, Ser: 1.13 mg/dL (ref 0.76–1.27)
GFR calc Af Amer: 72 mL/min/{1.73_m2} (ref >59)
GFR calc non Af Amer: 62 mL/min/{1.73_m2} (ref >59)
Glucose: 83 mg/dL (ref 65–99)
Potassium: 3.8 mmol/L (ref 3.5–5.2)
Sodium: 144 mmol/L (ref 134–144)

## 2019-02-18 ENCOUNTER — Other Ambulatory Visit: Payer: Medicare Other

## 2019-02-18 ENCOUNTER — Ambulatory Visit: Payer: Medicare Other | Admitting: Cardiology

## 2019-03-03 ENCOUNTER — Ambulatory Visit (INDEPENDENT_AMBULATORY_CARE_PROVIDER_SITE_OTHER): Payer: Medicare Other | Admitting: Emergency Medicine

## 2019-03-03 ENCOUNTER — Ambulatory Visit: Payer: Medicare Other | Admitting: Emergency Medicine

## 2019-03-03 ENCOUNTER — Encounter: Payer: Self-pay | Admitting: Emergency Medicine

## 2019-03-03 ENCOUNTER — Other Ambulatory Visit: Payer: Self-pay

## 2019-03-03 VITALS — BP 148/69 | HR 84 | Temp 98.6°F | Resp 17 | Ht 67.0 in | Wt 157.0 lb

## 2019-03-03 DIAGNOSIS — N611 Abscess of the breast and nipple: Secondary | ICD-10-CM | POA: Diagnosis not present

## 2019-03-03 DIAGNOSIS — G4700 Insomnia, unspecified: Secondary | ICD-10-CM

## 2019-03-03 DIAGNOSIS — Z23 Encounter for immunization: Secondary | ICD-10-CM

## 2019-03-03 DIAGNOSIS — Z7689 Persons encountering health services in other specified circumstances: Secondary | ICD-10-CM

## 2019-03-03 MED ORDER — CEPHALEXIN 500 MG PO CAPS: 500.0000 mg | ORAL_CAPSULE | Freq: Three times a day (TID) | ORAL | 0 refills | Status: AC

## 2019-03-03 MED ORDER — TRAZODONE HCL 50 MG PO TABS: 25.0000 mg | ORAL_TABLET | Freq: Every evening | ORAL | 3 refills | Status: DC | PRN

## 2019-03-03 NOTE — Patient Instructions (Signed)
Incision and Drainage, Care After  This sheet gives you information about how to care for yourself after your procedure. Your health care provider may also give you more specific instructions. If you have problems or questions, contact your health care provider.  What can I expect after the procedure?  After the procedure, it is common to have:  · Pain or discomfort around the incision site.  · Blood, fluid, or pus (drainage) from the incision.  · Redness and firm skin around the incision site.  Follow these instructions at home:  Medicines  · Take over-the-counter and prescription medicines only as told by your health care provider.  · If you were prescribed an antibiotic medicine, use or take it as told by your health care provider. Do not stop using the antibiotic even if you start to feel better.  Wound care    Follow instructions from your health care provider about how to take care of your wound. Make sure you:  · Wash your hands with soap and water before and after you change your bandage (dressing). If soap and water are not available, use hand sanitizer.  · Change your dressing and packing as told by your health care provider.  ? If your dressing is dry or stuck when you try to remove it, moisten or wet the dressing with saline or water so that it can be removed without harming your skin or tissues.  ? If your wound is packed, leave it in place until your health care provider tells you to remove it. To remove the packing, moisten or wet the packing with saline or water so that it can be removed without harming your skin or tissues.  · Leave stitches (sutures), skin glue, or adhesive strips in place. These skin closures may need to stay in place for 2 weeks or longer. If adhesive strip edges start to loosen and curl up, you may trim the loose edges. Do not remove adhesive strips completely unless your health care provider tells you to do that.  Check your wound every day for signs of infection. Check  for:  · More redness, swelling, or pain.  · More fluid or blood.  · Warmth.  · Pus or a bad smell.  If you were sent home with a drain tube in place, follow instructions from your health care provider about:  · How to empty it.  · How to care for it at home.     General instructions  · Rest the affected area.  · Do not take baths, swim, or use a hot tub until your health care provider approves. Ask your health care provider if you may take showers. You may only be allowed to take sponge baths.  · Return to your normal activities as told by your health care provider. Ask your health care provider what activities are safe for you. Your health care provider may put you on activity or lifting restrictions.  · The incision will continue to drain. It is normal to have some clear or slightly bloody drainage. The amount of drainage should lessen each day.  · Do not apply any creams, ointments, or liquids unless you have been told to by your health care provider.  · Keep all follow-up visits as told by your health care provider. This is important.  Contact a health care provider if:  · Your cyst or abscess returns.  · You have a fever or chills.  · You have more redness, swelling, or pain   around your incision.  · You have more fluid or blood coming from your incision.  · Your incision feels warm to the touch.  · You have pus or a bad smell coming from your incision.  · You have red streaks above or below the incision site.  Get help right away if:  · You have severe pain or bleeding.  · You cannot eat or drink without vomiting.  · You have decreased urine output.  · You become short of breath.  · You have chest pain.  · You cough up blood.  · The affected area becomes numb or starts to tingle.  These symptoms may represent a serious problem that is an emergency. Do not wait to see if the symptoms will go away. Get medical help right away. Call your local emergency services (911 in the [place] Do not drive yourself to the  hospital.  Summary  · After this procedure, it is common to have fluid, blood, or pus coming from the surgery site.  · Follow all home care instructions. You will be told how to take care of your incision, how to check for infection, and how to take medicines.  · If you were prescribed an antibiotic medicine, take it as told by your health care provider. Do not stop taking the antibiotic even if you start to feel better.  · Contact a health care provider if you have increased redness, swelling, or pain around your incision. Get help right away if you have chest pain, you vomit, you cough up blood, or you have shortness of breath.  · Keep all follow-up visits as told by your health care provider. This is important.  This information is not intended to replace advice given to you by your health care provider. Make sure you discuss any questions you have with your health care provider.  Document Released: 05/14/2011 Document Revised: 01/20/2018 Document Reviewed: 01/20/2018  Elsevier Patient Education © 2020 Elsevier Inc.   

## 2019-03-03 NOTE — Progress Notes (Signed)
Eric [name] 78 y.o.   Chief Complaint  Patient presents with  . Cyst    cyst is located on pt's L side of chest around pt's nipple. cyst looks red and swollen. pt stats pain in that same area 3-4/10Pain level.  . Establish Care    HISTORY OF PRESENT ILLNESS: This is a 78 y.o. male complaining of swollen and painful left sided nipple abscess. Here also to establish care with me. Also requesting trazodone to help him sleep.  Its worked in the past.  HPI   Prior to Admission medications   Medication Sig Start Date End Date Taking? Authorizing Provider  aspirin EC 81 MG tablet Take 81 mg by mouth at bedtime.   Yes [provider]  atorvastatin (LIPITOR) 40 MG tablet TAKE 1 TABLET BY MOUTH DAILY AT 6 PM 01/08/19  Yes Belva Crome, MD  diphenhydrAMINE (BENADRYL) 25 MG tablet Take 25 mg by mouth at bedtime as needed for sleep.    Yes [provider]  losartan (COZAAR) 50 MG tablet Take 1 tablet (50 mg total) by mouth daily. 01/26/19 04/26/19 Yes Belva Crome, MD  omeprazole (PRILOSEC) 20 MG capsule Take 20 mg by mouth daily as needed (acid relfux).   Yes [provider]  Polyethylene Glycol 3350 (PEG 3350) POWD Take by mouth. 02/11/18  Yes [provider]  sildenafil (VIAGRA) 100 MG tablet Take 100 mg by mouth daily as needed. 12/10/17  Yes [provider]  prochlorperazine (COMPAZINE) 10 MG tablet Take 1 tablet (10 mg total) by mouth every 6 (six) hours as needed for nausea or vomiting. Patient not taking: Reported on 03/03/2019 02/20/18   Curt Bears, MD    Allergies  Allergen Reactions  . Neosporin [Neomycin-Bacitracin Zn-Polymyx] Anaphylaxis    Patient Active Problem List   Diagnosis Date Noted  . Hypercholesterolemia 03/25/2016  . Grade 1 follicular lymphoma of lymph nodes of multiple regions ([place]) 02/16/2015  . CAD in native artery 08/18/2014  . Hearing loss 07/11/2012  . Insomnia, idiopathic 07/11/2012  . Erectile  dysfunction 07/11/2012  . Non-STEMI (non-ST elevated myocardial infarction) ([place]) 03/06/2012  . Lymphoma ([place]) 06/05/2011    Class: Chronic    Past Medical History:  Diagnosis Date  . Cancer ([place])   . Coronary atherosclerosis of native coronary artery   . Encounter for antineoplastic chemotherapy 06/20/2015  . GERD (gastroesophageal reflux disease)   . History of heart attack   . Hyperlipidemia   . Myocardial infarction ([place])   . Non Hodgkin's lymphoma [facility])     Past Surgical History:  Procedure Laterality Date  . CORONARY STENT PLACEMENT    . LEFT HEART CATHETERIZATION WITH CORONARY ANGIOGRAM N/A 03/03/2012   Procedure: LEFT HEART CATHETERIZATION WITH CORONARY ANGIOGRAM;  Surgeon: [name], MD;  Location: [facility] CATH LAB;  Service: Cardiovascular;  Laterality: N/A;  . PERCUTANEOUS CORONARY STENT INTERVENTION (PCI-S) N/A 03/04/2012   Procedure: PERCUTANEOUS CORONARY STENT INTERVENTION (PCI-S);  Surgeon: [name], MD;  Location: [facility] CATH LAB;  Service: Cardiovascular;  Laterality: N/A;    Social History   Socioeconomic History  . Marital status: Married    Spouse name: Not on file  . Number of children: Not on file  . Years of education: Not on file  . Highest education level: Not on file  Occupational History  . Occupation: pharmacist  Tobacco Use  . Smoking status: Never Smoker  . Smokeless tobacco: Never Used  Substance and Sexual Activity  . Alcohol use:  Yes    Comment: occasionally  . Drug use: No  . Sexual activity: Yes  Other Topics Concern  . 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       Tobacco: never      Alcohol:  Socially; almost none in 2017      Exercise in  door track, cardio machine and treadmill 1-2 times per week      ADLs: independent with ADLs;       Advanced Directives: no; desires FULL CODE.    Social Determinants of Health   Financial Resource Strain:   . Difficulty of Paying Living Expenses: Not on file  Food Insecurity:   . Worried About Charity fundraiser in the Last Year: Not on file  . Ran Out of Food in the Last Year: Not on file  Transportation Needs:   . Lack of Transportation (Medical): Not on file  . Lack of Transportation (Non-Medical): Not on file  Physical Activity:   . Days of Exercise per Week: Not on file  . Minutes of Exercise per Session: Not on file  Stress:   . Feeling of Stress : Not on file  Social Connections:   . Frequency of Communication with Friends and Family: Not on file  . Frequency of Social Gatherings with Friends and Family: Not on file  . Attends Religious Services: Not on file  . Active Member of Clubs or Organizations: Not on file  . Attends Archivist Meetings: Not on file  . Marital Status: Not on file  Intimate Partner Violence:   . Fear of Current or Ex-Partner: Not on file  . Emotionally Abused: Not on file  . Physically Abused: Not on file  . Sexually Abused: Not on file    Family History  Problem Relation Age of Onset  . Leukemia Mother      Review of Systems  Constitutional: Negative.  Negative for chills and fever.  HENT: Negative.  Negative for congestion and sore throat.   Respiratory: Negative.  Negative for cough and shortness of breath.   Cardiovascular: Negative.  Negative for chest pain and palpitations.  Gastrointestinal: Negative.  Negative for abdominal pain, diarrhea, nausea and vomiting.  Genitourinary: Negative.   Musculoskeletal: Negative for myalgias.  Skin: Negative.   Neurological: Negative.  Negative for dizziness and headaches.  All other systems reviewed and are negative.  Today's Vitals   03/03/19 1107  BP: (!) 148/69  Pulse: 84    Resp: 17  Temp: 98.6 F (37 C)  TempSrc: Oral  SpO2: 99%  Weight: 157 lb (71.2 kg)  Height: 5\' 7"  (1.702 m)   Body mass index is 24.59 kg/m.   Physical Exam Vitals reviewed.  Constitutional:      Appearance: Normal appearance.  HENT:     Head: Normocephalic.  Eyes:     Extraocular Movements: Extraocular movements intact.     Pupils: Pupils are equal, round, and reactive to light.  Cardiovascular:     Rate and Rhythm: Normal rate.  Pulmonary:     Effort: Pulmonary effort is normal.  Musculoskeletal:        General: Normal range of motion.  Cervical back: Normal range of motion.  Skin:    General: Skin is warm and dry.     Capillary Refill: Capillary refill takes less than 2 seconds.     Comments: Left nipple area: Positive fluctuant area with surrounding erythema and swelling.  See picture below.  Neurological:     General: No focal deficit present.     Mental Status: He is alert and oriented to person, place, and time.  Psychiatric:        Mood and Affect: Mood normal.        Behavior: Behavior normal.      Incision and drainage procedure note: After cleaning area with Betadine skin infiltrated with 1% lidocaine with epi. Small incision made with #15 disposable blade and moderate amount of purulent material drained. Packed with half inch gauze and cover with sterile dressing. No complications.  Patient tolerated procedure well.  ASSESSMENT & PLAN: Adrienne was seen today for cyst and establish care.  Diagnoses and all orders for this visit:  Abscess of left nipple -     cephALEXin (KEFLEX) 500 MG capsule; Take 1 capsule (500 mg total) by mouth 3 (three) times daily for 7 days.  Need for prophylactic vaccination and inoculation against influenza -     Flu Vaccine QUAD High Dose(Fluad)  Need for prophylactic vaccination against Streptococcus pneumoniae (pneumococcus) -     Pneumococcal (PPSV23) vaccine  Encounter to establish care  Encounter for  incision and drainage procedure  Need for diphtheria-tetanus-pertussis (Tdap) vaccine -     TDAP VACCINE  Insomnia, unspecified type -     traZODone (DESYREL) 50 MG tablet; Take 0.5-1 tablets (25-50 mg total) by mouth at bedtime as needed for sleep.    Patient Instructions  Incision and Drainage, Care After This sheet gives you information about how to care for yourself after your procedure. Your health care provider may also give you more specific instructions. If you have problems or questions, contact your health care provider. What can I expect after the procedure? After the procedure, it is common to have:  Pain or discomfort around the incision site.  Blood, fluid, or pus (drainage) from the incision.  Redness and firm skin around the incision site. Follow these instructions at home: Medicines  Take over-the-counter and prescription medicines only as told by your health care provider.  If you were prescribed an antibiotic medicine, use or take it as told by your health care provider. Do not stop using the antibiotic even if you start to feel better. Wound care Follow instructions from your health care provider about how to take care of your wound. Make sure you:  Wash your hands with soap and water before and after you change your bandage (dressing). If soap and water are not available, use hand sanitizer.  Change your dressing and packing as told by your health care provider. ? If your dressing is dry or stuck when you try to remove it, moisten or wet the dressing with saline or water so that it can be removed without harming your skin or tissues. ? If your wound is packed, leave it in place until your health care provider tells you to remove it. To remove the packing, moisten or wet the packing with saline or water so that it can be removed without harming your skin or tissues.  Leave stitches (sutures), skin glue, or adhesive strips in place. These skin closures may need  to stay in place for 2 weeks or longer.  If adhesive strip edges start to loosen and curl up, you may trim the loose edges. Do not remove adhesive strips completely unless your health care provider tells you to do that. Check your wound every day for signs of infection. Check for:  More redness, swelling, or pain.  More fluid or blood.  Warmth.  Pus or a bad smell. If you were sent home with a drain tube in place, follow instructions from your health care provider about:  How to empty it.  How to care for it at home.  General instructions  Rest the affected area.  Do not take baths, swim, or use a hot tub until your health care provider approves. Ask your health care provider if you may take showers. You may only be allowed to take sponge baths.  Return to your normal activities as told by your health care provider. Ask your health care provider what activities are safe for you. Your health care provider may put you on activity or lifting restrictions.  The incision will continue to drain. It is normal to have some clear or slightly bloody drainage. The amount of drainage should lessen each day.  Do not apply any creams, ointments, or liquids unless you have been told to by your health care provider.  Keep all follow-up visits as told by your health care provider. This is important. Contact a health care provider if:  Your cyst or abscess returns.  You have a fever or chills.  You have more redness, swelling, or pain around your incision.  You have more fluid or blood coming from your incision.  Your incision feels warm to the touch.  You have pus or a bad smell coming from your incision.  You have red streaks above or below the incision site. Get help right away if:  You have severe pain or bleeding.  You cannot eat or drink without vomiting.  You have decreased urine output.  You become short of breath.  You have chest pain.  You cough up blood.  The  affected area becomes numb or starts to tingle. These symptoms may represent a serious problem that is an emergency. Do not wait to see if the symptoms will go away. Get medical help right away. Call your local emergency services (911 in the [place] Do not drive yourself to the hospital. Summary  After this procedure, it is common to have fluid, blood, or pus coming from the surgery site.  Follow all home care instructions. You will be told how to take care of your incision, how to check for infection, and how to take medicines.  If you were prescribed an antibiotic medicine, take it as told by your health care provider. Do not stop taking the antibiotic even if you start to feel better.  Contact a health care provider if you have increased redness, swelling, or pain around your incision. Get help right away if you have chest pain, you vomit, you cough up blood, or you have shortness of breath.  Keep all follow-up visits as told by your health care provider. This is important. This information is not intended to replace advice given to you by your health care provider. Make sure you discuss any questions you have with your health care provider. Document Released: 05/14/2011 Document Revised: 01/20/2018 Document Reviewed: 01/20/2018 Elsevier Patient Education  2020 Elsevier Inc.      Agustina Caroli, MD [facility]

## 2019-03-05 ENCOUNTER — Other Ambulatory Visit: Payer: Self-pay

## 2019-03-05 ENCOUNTER — Ambulatory Visit (INDEPENDENT_AMBULATORY_CARE_PROVIDER_SITE_OTHER): Payer: Medicare Other | Admitting: Emergency Medicine

## 2019-03-05 ENCOUNTER — Encounter: Payer: Self-pay | Admitting: Emergency Medicine

## 2019-03-05 VITALS — BP 132/70 | HR 72 | Temp 98.0°F | Resp 16 | Ht 67.0 in | Wt 157.2 lb

## 2019-03-05 DIAGNOSIS — N611 Abscess of the breast and nipple: Secondary | ICD-10-CM

## 2019-03-05 DIAGNOSIS — Z09 Encounter for follow-up examination after completed treatment for conditions other than malignant neoplasm: Secondary | ICD-10-CM

## 2019-03-05 NOTE — Patient Instructions (Signed)
Incision and Drainage, Care After This sheet gives you information about how to care for yourself after your procedure. Your health care provider may also give you more specific instructions. If you have problems or questions, contact your health care provider. What can I expect after the procedure? After the procedure, it is common to have:  Pain or discomfort around the incision site.  Blood, fluid, or pus (drainage) from the incision.  Redness and firm skin around the incision site. Follow these instructions at home: Medicines  Take over-the-counter and prescription medicines only as told by your health care provider.  If you were prescribed an antibiotic medicine, use or take it as told by your health care provider. Do not stop using the antibiotic even if you start to feel better. Wound care Follow instructions from your health care provider about how to take care of your wound. Make sure you:  Wash your hands with soap and water before and after you change your bandage (dressing). If soap and water are not available, use hand sanitizer.  Change your dressing and packing as told by your health care provider. ? If your dressing is dry or stuck when you try to remove it, moisten or wet the dressing with saline or water so that it can be removed without harming your skin or tissues. ? If your wound is packed, leave it in place until your health care provider tells you to remove it. To remove the packing, moisten or wet the packing with saline or water so that it can be removed without harming your skin or tissues.  Leave stitches (sutures), skin glue, or adhesive strips in place. These skin closures may need to stay in place for 2 weeks or longer. If adhesive strip edges start to loosen and curl up, you may trim the loose edges. Do not remove adhesive strips completely unless your health care provider tells you to do that. Check your wound every day for signs of infection. Check  for:  More redness, swelling, or pain.  More fluid or blood.  Warmth.  Pus or a bad smell. If you were sent home with a drain tube in place, follow instructions from your health care provider about:  How to empty it.  How to care for it at home.  General instructions  Rest the affected area.  Do not take baths, swim, or use a hot tub until your health care provider approves. Ask your health care provider if you may take showers. You may only be allowed to take sponge baths.  Return to your normal activities as told by your health care provider. Ask your health care provider what activities are safe for you. Your health care provider may put you on activity or lifting restrictions.  The incision will continue to drain. It is normal to have some clear or slightly bloody drainage. The amount of drainage should lessen each day.  Do not apply any creams, ointments, or liquids unless you have been told to by your health care provider.  Keep all follow-up visits as told by your health care provider. This is important. Contact a health care provider if:  Your cyst or abscess returns.  You have a fever or chills.  You have more redness, swelling, or pain around your incision.  You have more fluid or blood coming from your incision.  Your incision feels warm to the touch.  You have pus or a bad smell coming from your incision.  You have red streaks   above or below the incision site. Get help right away if:  You have severe pain or bleeding.  You cannot eat or drink without vomiting.  You have decreased urine output.  You become short of breath.  You have chest pain.  You cough up blood.  The affected area becomes numb or starts to tingle. These symptoms may represent a serious problem that is an emergency. Do not wait to see if the symptoms will go away. Get medical help right away. Call your local emergency services (911 in the [place] Do not drive yourself to the  hospital. Summary  After this procedure, it is common to have fluid, blood, or pus coming from the surgery site.  Follow all home care instructions. You will be told how to take care of your incision, how to check for infection, and how to take medicines.  If you were prescribed an antibiotic medicine, take it as told by your health care provider. Do not stop taking the antibiotic even if you start to feel better.  Contact a health care provider if you have increased redness, swelling, or pain around your incision. Get help right away if you have chest pain, you vomit, you cough up blood, or you have shortness of breath.  Keep all follow-up visits as told by your health care provider. This is important. This information is not intended to replace advice given to you by your health care provider. Make sure you discuss any questions you have with your health care provider. Document Revised: 01/20/2018 Document Reviewed: 01/20/2018 Elsevier Patient Education  2020 Elsevier Inc.   

## 2019-03-05 NOTE — Progress Notes (Signed)
[name] 78 y.o.   Chief Complaint  Patient presents with  . Follow-up    F/U on absess    HISTORY OF PRESENT ILLNESS: This is a 78 y.o. male here for follow-up of left nipple abscess.  Taking antibiotic.  No side effects.  Doing much better.  Has no complaints.  HPI   Prior to Admission medications   Medication Sig Start Date End Date Taking? Authorizing Provider  aspirin EC 81 MG tablet Take 81 mg by mouth at bedtime.    [provider]  atorvastatin (LIPITOR) 40 MG tablet TAKE 1 TABLET BY MOUTH DAILY AT 6 PM 01/08/19   Belva Crome, MD  cephALEXin (KEFLEX) 500 MG capsule Take 1 capsule (500 mg total) by mouth 3 (three) times daily for 7 days. 03/03/19 03/10/19  Horald Pollen, MD  diphenhydrAMINE (BENADRYL) 25 MG tablet Take 25 mg by mouth at bedtime as needed for sleep.     [provider]  losartan (COZAAR) 50 MG tablet Take 1 tablet (50 mg total) by mouth daily. 01/26/19 04/26/19  Belva Crome, MD  omeprazole (PRILOSEC) 20 MG capsule Take 20 mg by mouth daily as needed (acid relfux).    [provider]  Polyethylene Glycol 3350 (PEG 3350) POWD Take by mouth. 02/11/18   [provider]  prochlorperazine (COMPAZINE) 10 MG tablet Take 1 tablet (10 mg total) by mouth every 6 (six) hours as needed for nausea or vomiting. Patient not taking: Reported on 03/03/2019 02/20/18   Curt Bears, MD  sildenafil (VIAGRA) 100 MG tablet Take 100 mg by mouth daily as needed. 12/10/17   [provider]  traZODone (DESYREL) 50 MG tablet Take 0.5-1 tablets (25-50 mg total) by mouth at bedtime as needed for sleep. 03/03/19   Horald Pollen, MD    Allergies  Allergen Reactions  . Neosporin [Neomycin-Bacitracin Zn-Polymyx] Anaphylaxis    Patient Active Problem List   Diagnosis Date Noted  . Hypercholesterolemia 03/25/2016  . Grade 1 follicular lymphoma of lymph nodes of multiple regions ([place]) 02/16/2015  . CAD in native artery  08/18/2014  . Hearing loss 07/11/2012  . Insomnia, idiopathic 07/11/2012  . Erectile dysfunction 07/11/2012  . Non-STEMI (non-ST elevated myocardial infarction) ([place]) 03/06/2012  . Lymphoma ([place]) 06/05/2011    Class: Chronic    Past Medical History:  Diagnosis Date  . Cancer ([place])   . Coronary atherosclerosis of native coronary artery   . Encounter for antineoplastic chemotherapy 06/20/2015  . GERD (gastroesophageal reflux disease)   . History of heart attack   . Hyperlipidemia   . Myocardial infarction ([place])   . Non Hodgkin's lymphoma [facility])     Past Surgical History:  Procedure Laterality Date  . CORONARY STENT PLACEMENT    . LEFT HEART CATHETERIZATION WITH CORONARY ANGIOGRAM N/A 03/03/2012   Procedure: LEFT HEART CATHETERIZATION WITH CORONARY ANGIOGRAM;  Surgeon: [name], MD;  Location: [facility] CATH LAB;  Service: Cardiovascular;  Laterality: N/A;  . PERCUTANEOUS CORONARY STENT INTERVENTION (PCI-S) N/A 03/04/2012   Procedure: PERCUTANEOUS CORONARY STENT INTERVENTION (PCI-S);  Surgeon: [name], MD;  Location: [facility] CATH LAB;  Service: Cardiovascular;  Laterality: N/A;    Social History   Socioeconomic History  . Marital status: Married    Spouse name: Not on file  . Number of children: Not on file  . Years of education: Not on file  . Highest education level: Not on file  Occupational History  . Occupation: pharmacist  Tobacco Use  .  Smoking status: Never Smoker  . Smokeless tobacco: Never Used  Substance and Sexual Activity  . Alcohol use: Yes    Comment: occasionally  . Drug use: No  . Sexual activity: Yes  Other Topics Concern  . 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        Tobacco: never      Alcohol:  Socially; almost none in 2017      Exercise in door track, cardio machine and treadmill 1-2 times per week      ADLs: independent with ADLs;       Advanced Directives: no; desires FULL CODE.    Social Determinants of Health   Financial Resource Strain:   . Difficulty of Paying Living Expenses: Not on file  Food Insecurity:   . Worried About Charity fundraiser in the Last Year: Not on file  . Ran Out of Food in the Last Year: Not on file  Transportation Needs:   . Lack of Transportation (Medical): Not on file  . Lack of Transportation (Non-Medical): Not on file  Physical Activity:   . Days of Exercise per Week: Not on file  . Minutes of Exercise per Session: Not on file  Stress:   . Feeling of Stress : Not on file  Social Connections:   . Frequency of Communication with Friends and Family: Not on file  . Frequency of Social Gatherings with Friends and Family: Not on file  . Attends Religious Services: Not on file  . Active Member of Clubs or Organizations: Not on file  . Attends Archivist Meetings: Not on file  . Marital Status: Not on file  Intimate Partner Violence:   . Fear of Current or Ex-Partner: Not on file  . Emotionally Abused: Not on file  . Physically Abused: Not on file  . Sexually Abused: Not on file    Family History  Problem Relation Age of Onset  . Leukemia Mother      Review of Systems  Constitutional: Negative.  Negative for chills and fever.  HENT: Negative.  Negative for congestion and sore throat.   Respiratory: Negative.  Negative for shortness of breath.   Cardiovascular: Negative for chest pain and palpitations.  Gastrointestinal: Negative for abdominal pain, diarrhea, nausea and vomiting.  Skin: Negative.   Neurological: Negative for dizziness and headaches.  All other systems reviewed and are negative.  Today's Vitals   03/05/19 1129  BP: 132/70  Pulse: 72  Resp: 16  Temp: 98 F (36.7 C)   TempSrc: Temporal  SpO2: 99%  Weight: 157 lb 3.2 oz (71.3 kg)  Height: 5\' 7"  (1.702 m)   Body mass index is 24.62 kg/m.   Physical Exam Vitals reviewed.  Constitutional:      Appearance: Normal appearance.  HENT:     Head: Normocephalic.  Eyes:     Extraocular Movements: Extraocular movements intact.  Cardiovascular:     Rate and Rhythm: Normal rate.  Pulmonary:     Effort: Pulmonary effort is normal.  Musculoskeletal:     Cervical back: Normal range of motion.  Skin:    General: Skin is warm and dry.  Capillary Refill: Capillary refill takes less than 2 seconds.     Comments: Left nipple: Improved erythema and swelling.  Packing in place.  Removed.  No more significant drainage.  Much improved.  Neurological:     General: No focal deficit present.     Mental Status: He is alert and oriented to person, place, and time.  Psychiatric:        Mood and Affect: Mood normal.        Behavior: Behavior normal.      ASSESSMENT & PLAN: Breven was seen today for follow-up.  Diagnoses and all orders for this visit:  Abscess of left nipple Comments: Much improved  Encounter for recheck of abscess following incision and drainage    Patient Instructions  Incision and Drainage, Care After This sheet gives you information about how to care for yourself after your procedure. Your health care provider may also give you more specific instructions. If you have problems or questions, contact your health care provider. What can I expect after the procedure? After the procedure, it is common to have:  Pain or discomfort around the incision site.  Blood, fluid, or pus (drainage) from the incision.  Redness and firm skin around the incision site. Follow these instructions at home: Medicines  Take over-the-counter and prescription medicines only as told by your health care provider.  If you were prescribed an antibiotic medicine, use or take it as told by your health care  provider. Do not stop using the antibiotic even if you start to feel better. Wound care Follow instructions from your health care provider about how to take care of your wound. Make sure you:  Wash your hands with soap and water before and after you change your bandage (dressing). If soap and water are not available, use hand sanitizer.  Change your dressing and packing as told by your health care provider. ? If your dressing is dry or stuck when you try to remove it, moisten or wet the dressing with saline or water so that it can be removed without harming your skin or tissues. ? If your wound is packed, leave it in place until your health care provider tells you to remove it. To remove the packing, moisten or wet the packing with saline or water so that it can be removed without harming your skin or tissues.  Leave stitches (sutures), skin glue, or adhesive strips in place. These skin closures may need to stay in place for 2 weeks or longer. If adhesive strip edges start to loosen and curl up, you may trim the loose edges. Do not remove adhesive strips completely unless your health care provider tells you to do that. Check your wound every day for signs of infection. Check for:  More redness, swelling, or pain.  More fluid or blood.  Warmth.  Pus or a bad smell. If you were sent home with a drain tube in place, follow instructions from your health care provider about:  How to empty it.  How to care for it at home.  General instructions  Rest the affected area.  Do not take baths, swim, or use a hot tub until your health care provider approves. Ask your health care provider if you may take showers. You may only be allowed to take sponge baths.  Return to your normal activities as told by your health care provider. Ask your health care provider what activities are safe for you. Your health care provider may put you on  activity or lifting restrictions.  The incision will continue to  drain. It is normal to have some clear or slightly bloody drainage. The amount of drainage should lessen each day.  Do not apply any creams, ointments, or liquids unless you have been told to by your health care provider.  Keep all follow-up visits as told by your health care provider. This is important. Contact a health care provider if:  Your cyst or abscess returns.  You have a fever or chills.  You have more redness, swelling, or pain around your incision.  You have more fluid or blood coming from your incision.  Your incision feels warm to the touch.  You have pus or a bad smell coming from your incision.  You have red streaks above or below the incision site. Get help right away if:  You have severe pain or bleeding.  You cannot eat or drink without vomiting.  You have decreased urine output.  You become short of breath.  You have chest pain.  You cough up blood.  The affected area becomes numb or starts to tingle. These symptoms may represent a serious problem that is an emergency. Do not wait to see if the symptoms will go away. Get medical help right away. Call your local emergency services (911 in the [place] Do not drive yourself to the hospital. Summary  After this procedure, it is common to have fluid, blood, or pus coming from the surgery site.  Follow all home care instructions. You will be told how to take care of your incision, how to check for infection, and how to take medicines.  If you were prescribed an antibiotic medicine, take it as told by your health care provider. Do not stop taking the antibiotic even if you start to feel better.  Contact a health care provider if you have increased redness, swelling, or pain around your incision. Get help right away if you have chest pain, you vomit, you cough up blood, or you have shortness of breath.  Keep all follow-up visits as told by your health care provider. This is important. This information is not  intended to replace advice given to you by your health care provider. Make sure you discuss any questions you have with your health care provider. Document Revised: 01/20/2018 Document Reviewed: 01/20/2018 Elsevier Patient Education  2020 Elsevier Inc.      Agustina Caroli, MD [facility]

## 2019-03-13 ENCOUNTER — Ambulatory Visit: Payer: Medicare Other | Admitting: Cardiology

## 2019-03-13 NOTE — Progress Notes (Deleted)
Cardiology Office Note   Date:  03/13/2019   ID:  Eric [name], DOB [birth year], MRN [id number]  PCP:  Patient, No Pcp Per  Cardiologist:  Dr. Tamala Julian     No chief complaint on file.     History of Present Illness: Eric [name] is a 79 y.o. male who presents for HTN and HLD.    a hx of follicular lymphoma,CAD with RCA DES 2013,prior MI,and hyperlipidemia.  He is doing well.  He has no cardiac symptoms.  He denies syncope chest pain and orthopnea.  He is not exercising because the gyms are closed.  His appetite has been stable.  Recent checkup for his lymphoma was unremarkable.  His BP was elevated and losartan 50 mg daily and now returning for BP recheck.   He needs BMP and lipids today   Past Medical History:  Diagnosis Date  . Cancer ([place])   . Coronary atherosclerosis of native coronary artery   . Encounter for antineoplastic chemotherapy 06/20/2015  . GERD (gastroesophageal reflux disease)   . History of heart attack   . Hyperlipidemia   . Myocardial infarction ([place])   . Non Hodgkin's lymphoma [facility])     Past Surgical History:  Procedure Laterality Date  . CORONARY STENT PLACEMENT    . LEFT HEART CATHETERIZATION WITH CORONARY ANGIOGRAM N/A 03/03/2012   Procedure: LEFT HEART CATHETERIZATION WITH CORONARY ANGIOGRAM;  Surgeon: [name], MD;  Location: [facility] CATH LAB;  Service: Cardiovascular;  Laterality: N/A;  . PERCUTANEOUS CORONARY STENT INTERVENTION (PCI-S) N/A 03/04/2012   Procedure: PERCUTANEOUS CORONARY STENT INTERVENTION (PCI-S);  Surgeon: [name], MD;  Location: [facility] CATH LAB;  Service: Cardiovascular;  Laterality: N/A;     Current Outpatient Medications  Medication Sig Dispense Refill  . aspirin EC 81 MG tablet Take 81 mg by mouth at bedtime.    Marland Kitchen atorvastatin (LIPITOR) 40 MG tablet TAKE 1 TABLET BY MOUTH DAILY AT 6 PM 90 tablet 0  . diphenhydrAMINE (BENADRYL) 25 MG tablet Take 25 mg by mouth at bedtime as needed for sleep.     Marland Kitchen losartan  (COZAAR) 50 MG tablet Take 1 tablet (50 mg total) by mouth daily. 90 tablet 3  . omeprazole (PRILOSEC) 20 MG capsule Take 20 mg by mouth daily as needed (acid relfux).    . Polyethylene Glycol 3350 (PEG 3350) POWD Take by mouth.    . prochlorperazine (COMPAZINE) 10 MG tablet Take 1 tablet (10 mg total) by mouth every 6 (six) hours as needed for nausea or vomiting. (Patient not taking: Reported on 03/03/2019) 30 tablet 0  . sildenafil (VIAGRA) 100 MG tablet Take 100 mg by mouth daily as needed.  3  . traZODone (DESYREL) 50 MG tablet Take 0.5-1 tablets (25-50 mg total) by mouth at bedtime as needed for sleep. 30 tablet 3   No current facility-administered medications for this visit.    Allergies:   Neosporin [neomycin-bacitracin zn-polymyx]    Social History:  The patient  reports that he has never smoked. He has never used smokeless tobacco. He reports current alcohol use. He reports that he does not use drugs.   Family History:  The patient's ***family history includes Leukemia in his mother.    ROS:  General:no colds or fevers, no weight changes Skin:no rashes or ulcers HEENT:no blurred vision, no congestion CV:see HPI PUL:see HPI GI:no diarrhea constipation or melena, no indigestion GU:no hematuria, no dysuria MS:no joint pain, no claudication Neuro:no syncope, no lightheadedness Endo:no diabetes, no thyroid disease  Wt Readings from Last 3 Encounters:  03/05/19 157 lb 3.2 oz (71.3 kg)  03/03/19 157 lb (71.2 kg)  01/26/19 164 lb (74.4 kg)     PHYSICAL EXAM: VS:  There were no vitals taken for this visit. , BMI There is no height or weight on file to calculate BMI. General:Pleasant affect, NAD Skin:Warm and dry, brisk capillary refill HEENT:normocephalic, sclera clear, mucus membranes moist Neck:supple, no JVD, no bruits  Heart:S1S2 RRR without murmur, gallup, rub or click Lungs:clear without rales, rhonchi, or wheezes KLK:JZPH, non tender, + BS, do not palpate liver spleen  or masses Ext:no lower ext edema, 2+ pedal pulses, 2+ radial pulses Neuro:alert and oriented, MAE, follows commands, + facial symmetry    EKG:  EKG is ordered today. The ekg ordered today demonstrates ***   Recent Labs: 12/26/2018: ALT 21; Hemoglobin 12.6; Platelet Count 232 02/16/2019: BUN 15; Creatinine, Ser 1.13; Potassium 3.8; Sodium 144    Lipid Panel    Component Value Date/Time   CHOL 121 02/16/2019 1000   TRIG 49 02/16/2019 1000   HDL 45 02/16/2019 1000   CHOLHDL 2.7 02/16/2019 1000   CHOLHDL 3.0 01/05/2015 1440   VLDL 15 01/05/2015 1440   LDLCALC 65 02/16/2019 1000       Other studies Reviewed: Additional studies/ records that were reviewed today include: ***.   ASSESSMENT AND PLAN:  1.  ***   Current medicines are reviewed with the patient today.  The patient Has no concerns regarding medicines.  The following changes have been made:  See above Labs/ tests ordered today include:see above  Disposition:   FU:  see above  Signed, Eric Kicks, NP  03/13/2019 11:38 AM    [address] [place], [place], [place], [place] Phone: [phone]; Fax: [phone]

## 2019-03-18 ENCOUNTER — Encounter: Payer: Self-pay | Admitting: Emergency Medicine

## 2019-03-18 ENCOUNTER — Other Ambulatory Visit: Payer: Self-pay

## 2019-03-18 ENCOUNTER — Ambulatory Visit (INDEPENDENT_AMBULATORY_CARE_PROVIDER_SITE_OTHER): Payer: Medicare Other | Admitting: Emergency Medicine

## 2019-03-18 VITALS — BP 166/71 | HR 88 | Temp 98.7°F | Resp 16 | Ht 67.0 in | Wt 159.0 lb

## 2019-03-18 DIAGNOSIS — B029 Zoster without complications: Secondary | ICD-10-CM

## 2019-03-18 MED ORDER — VALACYCLOVIR HCL 1 G PO TABS: 1000.0000 mg | ORAL_TABLET | Freq: Two times a day (BID) | ORAL | 0 refills | Status: DC

## 2019-03-18 NOTE — Progress Notes (Signed)
Eric [name] 79 y.o.   Chief Complaint  Patient presents with  . Rash    X3 DAY across chest and neck per pt    HISTORY OF PRESENT ILLNESS: This is a 79 y.o. male complaining of vesicular rash that started 3 days ago.  No other significant symptoms.  HPI   Prior to Admission medications   Medication Sig Start Date End Date Taking? Authorizing Provider  aspirin EC 81 MG tablet Take 81 mg by mouth at bedtime.   Yes [provider]  atorvastatin (LIPITOR) 40 MG tablet TAKE 1 TABLET BY MOUTH DAILY AT 6 PM 01/08/19  Yes Belva Crome, MD  diphenhydrAMINE (BENADRYL) 25 MG tablet Take 25 mg by mouth at bedtime as needed for sleep.    Yes [provider]  losartan (COZAAR) 50 MG tablet Take 1 tablet (50 mg total) by mouth daily. 01/26/19 04/26/19 Yes Belva Crome, MD  omeprazole (PRILOSEC) 20 MG capsule Take 20 mg by mouth daily as needed (acid relfux).   Yes [provider]  Polyethylene Glycol 3350 (PEG 3350) POWD Take by mouth. 02/11/18  Yes [provider]  sildenafil (VIAGRA) 100 MG tablet Take 100 mg by mouth daily as needed. 12/10/17  Yes [provider]  traZODone (DESYREL) 50 MG tablet Take 0.5-1 tablets (25-50 mg total) by mouth at bedtime as needed for sleep. 03/03/19  Yes Jaya Lapka, Ines Bloomer, MD  prochlorperazine (COMPAZINE) 10 MG tablet Take 1 tablet (10 mg total) by mouth every 6 (six) hours as needed for nausea or vomiting. Patient not taking: Reported on 03/18/2019 02/20/18   Curt Bears, MD  valACYclovir (VALTREX) 1000 MG tablet Take 1 tablet (1,000 mg total) by mouth 2 (two) times daily. 03/18/19   Horald Pollen, MD    Allergies  Allergen Reactions  . Neosporin [Neomycin-Bacitracin Zn-Polymyx] Anaphylaxis    Patient Active Problem List   Diagnosis Date Noted  . Hypercholesterolemia 03/25/2016  . Grade 1 follicular lymphoma of lymph nodes of multiple regions ([place]) 02/16/2015  . CAD in native artery 08/18/2014   . Hearing loss 07/11/2012  . Insomnia, idiopathic 07/11/2012  . Erectile dysfunction 07/11/2012  . Non-STEMI (non-ST elevated myocardial infarction) ([place]) 03/06/2012  . Lymphoma ([place]) 06/05/2011    Class: Chronic    Past Medical History:  Diagnosis Date  . Cancer ([place])   . Coronary atherosclerosis of native coronary artery   . Encounter for antineoplastic chemotherapy 06/20/2015  . GERD (gastroesophageal reflux disease)   . History of heart attack   . Hyperlipidemia   . Myocardial infarction ([place])   . Non Hodgkin's lymphoma [facility])     Past Surgical History:  Procedure Laterality Date  . CORONARY STENT PLACEMENT    . LEFT HEART CATHETERIZATION WITH CORONARY ANGIOGRAM N/A 03/03/2012   Procedure: LEFT HEART CATHETERIZATION WITH CORONARY ANGIOGRAM;  Surgeon: [name], MD;  Location[facility] C[facility] LAB;  Service: Cardiovascular;  Laterality: N/A;  . PERCUTANEOUS CORONARY STENT INTERVENTION (PCI-S) N/A 03/04/2012   Procedure: PERCUTANEOUS CORONARY STENT INTERVENTION (PCI-S);  Surgeon: [name], MD;  Location: [facility] C[facility] LAB;  Service: Cardiovascular;  Laterality: N/A;    Social History   Socioeconomic History  . Marital status: Married    Spouse name: Not on file  . Number of children: Not on file  . Years of education: Not on file  . Highest education level: Not on file  Occupational History  . Occupation: pharmacist  Tobacco Use  . Smoking status: Never Smoker  .  Smokeless tobacco: Never Used  Substance and Sexual Activity  . Alcohol use: Yes    Comment: occasionally  . Drug use: No  . Sexual activity: Yes  Other Topics Concern  . 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        Tobacco: never      Alcohol:  Socially; almost none in 2017      Exercise in door track, cardio machine and treadmill 1-2 times per week      ADLs: independent with ADLs;       Advanced Directives: no; desires FULL CODE.    Social Determinants of Health   Financial Resource Strain:   . Difficulty of Paying Living Expenses: Not on file  Food Insecurity:   . Worried About Charity fundraiser in the Last Year: Not on file  . Ran Out of Food in the Last Year: Not on file  Transportation Needs:   . Lack of Transportation (Medical): Not on file  . Lack of Transportation (Non-Medical): Not on file  Physical Activity:   . Days of Exercise per Week: Not on file  . Minutes of Exercise per Session: Not on file  Stress:   . Feeling of Stress : Not on file  Social Connections:   . Frequency of Communication with Friends and Family: Not on file  . Frequency of Social Gatherings with Friends and Family: Not on file  . Attends Religious Services: Not on file  . Active Member of Clubs or Organizations: Not on file  . Attends Archivist Meetings: Not on file  . Marital Status: Not on file  Intimate Partner Violence:   . Fear of Current or Ex-Partner: Not on file  . Emotionally Abused: Not on file  . Physically Abused: Not on file  . Sexually Abused: Not on file    Family History  Problem Relation Age of Onset  . Leukemia Mother      Review of Systems  Constitutional: Negative.  Negative for chills and fever.  HENT: Negative.   Respiratory: Negative.  Negative for cough and shortness of breath.   Cardiovascular: Negative.  Negative for chest pain and palpitations.  Gastrointestinal: Negative for abdominal pain, diarrhea, nausea and vomiting.  Genitourinary: Negative.   Skin: Positive for rash.  Neurological: Negative for dizziness and headaches.  All other systems reviewed and are negative.    Physical Exam Vitals reviewed.  Constitutional:      Appearance: Normal  appearance.  HENT:     Head: Normocephalic.  Eyes:     Extraocular Movements: Extraocular movements intact.     Pupils: Pupils are equal, round, and reactive to light.  Cardiovascular:     Rate and Rhythm: Normal rate and regular rhythm.     Heart sounds: Normal heart sounds.  Pulmonary:     Effort: Pulmonary effort is normal.  Musculoskeletal:     Cervical back: Normal range of motion and neck supple.  Skin:    General: Skin is warm and dry.     Capillary Refill: Capillary refill takes less than 2 seconds.     Findings: Rash (Vesicular rash to left C3 dermatome area) present.  Neurological:     General: No focal deficit present.     Mental Status: He is alert.  Psychiatric:        Mood and Affect: Mood normal.        Behavior: Behavior normal.        A total of 30 minutes was spent in the room with the patient, greater than 50% of which was in counseling/coordination of care regarding shingles, management and treatment, medication and side effects, post herpetic complications such as neuralgia, rates of recurrence, vaccine, prognosis, and need for follow-up if no better or worse in the next several weeks.  ASSESSMENT & PLAN: Boss was seen today for rash.  Diagnoses and all orders for this visit:  Herpes zoster without complication -     valACYclovir (VALTREX) 1000 MG tablet; Take 1 tablet (1,000 mg total) by mouth 2 (two) times daily.    Patient Instructions       If you have lab work done today you will be contacted with your lab results within the next 2 weeks.  If you have not heard from [place] then please contact us. The fastest way to get your results is to register for My Chart.   IF you received an x-ray today, you will receive an invoice from [facility]. Please contact [facility] at [phone] with questions or concerns regarding your invoice.   IF you received labwork today, you will receive an invoice from [place]. Please contact  [facility] at [phone] with questions or concerns regarding your invoice.   Our billing staff will not be able to assist you with questions regarding bills from these companies.  You will be contacted with the lab results as soon as they are available. The fastest way to get your results is to activate your My Chart account. Instructions are located on the last page of this paperwork. If you have not heard from [place] regarding the results in 2 weeks, please contact this office.     Shingles  Shingles is an infection. It gives you a painful skin rash and blisters that have fluid in them. Shingles is caused by the same germ (virus) that causes chickenpox. Shingles only happens in people who:  Have had chickenpox.  Have been given a shot of medicine (vaccine) to protect against chickenpox. Shingles is rare in this group. The first symptoms of shingles may be itching, tingling, or pain in an area on your skin. A rash will show on your skin a few days or weeks later. The rash is likely to be on one side of your body. The rash usually has a shape like a belt or a band. Over time, the rash turns into fluid-filled blisters. The blisters will break open, change into scabs, and dry up. Medicines may:  Help with pain and itching.  Help you get better sooner.  Help to prevent long-term problems. Follow these instructions at home: Medicines  Take over-the-counter and prescription medicines only as told by your doctor.  Put on an anti-itch cream or numbing cream where you have a rash, blisters, or scabs. Do this as told by your doctor. Helping with itching and discomfort   Put cold, wet cloths (cold compresses) on the area of the rash or blisters as told by your doctor.  Cool baths can help you feel better. Try adding baking soda or dry oatmeal to the water to lessen itching. Do not bathe in hot water. Blister and rash care  Keep your rash covered  with a loose bandage (dressing).  Wear loose  clothing that does not rub on your rash.  Keep your rash and blisters clean. To do this, wash the area with mild soap and cool water as told by your doctor.  Check your rash every day for signs of infection. Check for: ? More redness, swelling, or pain. ? Fluid or blood. ? Warmth. ? Pus or a bad smell.  Do not scratch your rash. Do not pick at your blisters. To help you to not scratch: ? Keep your fingernails clean and cut short. ? Wear gloves or mittens when you sleep, if scratching is a problem. General instructions  Rest as told by your doctor.  Keep all follow-up visits as told by your doctor. This is important.  Wash your hands often with soap and water. If soap and water are not available, use hand sanitizer. Doing this lowers your chance of getting a skin infection caused by germs (bacteria).  Your infection can cause chickenpox in people who have never had chickenpox or never got a shot of chickenpox vaccine. If you have blisters that did not change into scabs yet, try not to touch other people or be around other people, especially: ? Babies. ? Pregnant women. ? Children who have areas of red, itchy, or rough skin (eczema). ? Very old people who have transplants. ? People who have a long-term (chronic) sickness, like cancer or AIDS. Contact a doctor if:  Your pain does not get better with medicine.  Your pain does not get better after the rash heals.  You have any signs of infection in the rash area. These signs include: ? More redness, swelling, or pain around the rash. ? Fluid or blood coming from the rash. ? The rash area feeling warm to the touch. ? Pus or a bad smell coming from the rash. Get help right away if:  The rash is on your face or nose.  You have pain in your face or pain by your eye.  You lose feeling on one side of your face.  You have trouble seeing.  You have ear pain, or you have ringing in your ear.  You have a loss of taste.  Your  condition gets worse. Summary  Shingles gives you a painful skin rash and blisters that have fluid in them.  Shingles is an infection. It is caused by the same germ (virus) that causes chickenpox.  Keep your rash covered with a loose bandage (dressing). Wear loose clothing that does not rub on your rash.  If you have blisters that did not change into scabs yet, try not to touch other people or be around people. This information is not intended to replace advice given to you by your health care provider. Make sure you discuss any questions you have with your health care provider. Document Revised: 06/13/2018 Document Reviewed: 10/24/2016 Elsevier Patient Education  2020 Elsevier Inc.      Agustina Caroli, MD [facility]

## 2019-03-18 NOTE — Patient Instructions (Addendum)
   If you have lab work done today you will be contacted with your lab results within the next 2 weeks.  If you have not heard from us then please contact us. The fastest way to get your results is to register for My Chart.   IF you received an x-ray today, you will receive an invoice from [facility]. Please contact [facility] at [phone] with questions or concerns regarding your invoice.   IF you received labwork today, you will receive an invoice from [facility]. Please contact [facility] at [phone] with questions or concerns regarding your invoice.   Our billing staff will not be able to assist you with questions regarding bills from these companies.  You will be contacted with the lab results as soon as they are available. The fastest way to get your results is to activate your My Chart account. Instructions are located on the last page of this paperwork. If you have not heard from us regarding the results in 2 weeks, please contact this office.     Shingles  Shingles is an infection. It gives you a painful skin rash and blisters that have fluid in them. Shingles is caused by the same germ (virus) that causes chickenpox. Shingles only happens in people who:  Have had chickenpox.  Have been given a shot of medicine (vaccine) to protect against chickenpox. Shingles is rare in this group. The first symptoms of shingles may be itching, tingling, or pain in an area on your skin. A rash will show on your skin a few days or weeks later. The rash is likely to be on one side of your body. The rash usually has a shape like a belt or a band. Over time, the rash turns into fluid-filled blisters. The blisters will break open, change into scabs, and dry up. Medicines may:  Help with pain and itching.  Help you get better sooner.  Help to prevent long-term problems. Follow these instructions at home: Medicines  Take over-the-counter and prescription medicines only as  told by your doctor.  Put on an anti-itch cream or numbing cream where you have a rash, blisters, or scabs. Do this as told by your doctor. Helping with itching and discomfort   Put cold, wet cloths (cold compresses) on the area of the rash or blisters as told by your doctor.  Cool baths can help you feel better. Try adding baking soda or dry oatmeal to the water to lessen itching. Do not bathe in hot water. Blister and rash care  Keep your rash covered with a loose bandage (dressing).  Wear loose clothing that does not rub on your rash.  Keep your rash and blisters clean. To do this, wash the area with mild soap and cool water as told by your doctor.  Check your rash every day for signs of infection. Check for: ? More redness, swelling, or pain. ? Fluid or blood. ? Warmth. ? Pus or a bad smell.  Do not scratch your rash. Do not pick at your blisters. To help you to not scratch: ? Keep your fingernails clean and cut short. ? Wear gloves or mittens when you sleep, if scratching is a problem. General instructions  Rest as told by your doctor.  Keep all follow-up visits as told by your doctor. This is important.  Wash your hands often with soap and water. If soap and water are not available, use hand sanitizer. Doing this lowers your chance of getting a skin infection   caused by germs (bacteria).  Your infection can cause chickenpox in people who have never had chickenpox or never got a shot of chickenpox vaccine. If you have blisters that did not change into scabs yet, try not to touch other people or be around other people, especially: ? Babies. ? Pregnant women. ? Children who have areas of red, itchy, or rough skin (eczema). ? Very old people who have transplants. ? People who have a long-term (chronic) sickness, like cancer or AIDS. Contact a doctor if:  Your pain does not get better with medicine.  Your pain does not get better after the rash heals.  You have any signs  of infection in the rash area. These signs include: ? More redness, swelling, or pain around the rash. ? Fluid or blood coming from the rash. ? The rash area feeling warm to the touch. ? Pus or a bad smell coming from the rash. Get help right away if:  The rash is on your face or nose.  You have pain in your face or pain by your eye.  You lose feeling on one side of your face.  You have trouble seeing.  You have ear pain, or you have ringing in your ear.  You have a loss of taste.  Your condition gets worse. Summary  Shingles gives you a painful skin rash and blisters that have fluid in them.  Shingles is an infection. It is caused by the same germ (virus) that causes chickenpox.  Keep your rash covered with a loose bandage (dressing). Wear loose clothing that does not rub on your rash.  If you have blisters that did not change into scabs yet, try not to touch other people or be around people. This information is not intended to replace advice given to you by your health care provider. Make sure you discuss any questions you have with your health care provider. Document Revised: 06/13/2018 Document Reviewed: 10/24/2016 Elsevier Patient Education  2020 Elsevier Inc.  

## 2019-03-24 ENCOUNTER — Encounter: Payer: Medicare Other | Admitting: Emergency Medicine

## 2019-03-25 ENCOUNTER — Other Ambulatory Visit: Payer: Self-pay | Admitting: Emergency Medicine

## 2019-03-25 DIAGNOSIS — G4700 Insomnia, unspecified: Secondary | ICD-10-CM

## 2019-03-25 NOTE — Telephone Encounter (Signed)
Request for changes to Rx- sent for PCP review of request

## 2019-03-26 ENCOUNTER — Encounter: Payer: Self-pay | Admitting: Emergency Medicine

## 2019-04-13 ENCOUNTER — Other Ambulatory Visit: Payer: Self-pay | Admitting: Interventional Cardiology

## 2019-04-17 ENCOUNTER — Other Ambulatory Visit: Payer: Self-pay

## 2019-04-17 ENCOUNTER — Telehealth (INDEPENDENT_AMBULATORY_CARE_PROVIDER_SITE_OTHER): Payer: Medicare Other | Admitting: Adult Health Nurse Practitioner

## 2019-04-17 ENCOUNTER — Telehealth: Payer: Self-pay | Admitting: Emergency Medicine

## 2019-04-17 DIAGNOSIS — B029 Zoster without complications: Secondary | ICD-10-CM | POA: Diagnosis not present

## 2019-04-17 MED ORDER — INDOMETHACIN 25 MG PO CAPS: 25.0000 mg | ORAL_CAPSULE | Freq: Three times a day (TID) | ORAL | 0 refills | Status: AC

## 2019-04-17 NOTE — Telephone Encounter (Signed)
Scheduled patient for a Telemed concerning his pain .

## 2019-04-17 NOTE — Telephone Encounter (Signed)
Pt stated he is still having pain with associated with shingles. He would like to request a refill for rx Dr. Mitchel Honour gave him on 03/18/19 along with an antibiotic. Please advise.

## 2019-04-17 NOTE — Progress Notes (Signed)
Telemedicine Encounter- SOAP NOTE Established Patient  This telephone encounter was conducted with the patient's (or proxy's) verbal consent via audio telecommunications: yes/no: Yes Patient was instructed to have this encounter in a suitably private space; and to only have persons present to whom they give permission to participate. In addition, patient identity was confirmed by use of name plus two identifiers (DOB and address).  I discussed the limitations, risks, security and privacy concerns of performing an evaluation and management service by telephone and the availability of in person appointments. I also discussed with the patient that there may be a patient responsible charge related to this service. The patient expressed understanding and agreed to proceed.  I spent a total of TIME; 0 MIN TO 60 MIN: 10 minutes talking with the patient or their proxy.  Chief Complaint  Patient presents with  . shingles pain    pain in neck     Subjective   Eric [name] is a 79 y.o. established patient. Telephone visit today for  HPI   Patient is calling because he started having intense pain after his shingles blisters resolved.  Is primarily around the neck where he first had it.  He has been taking acetaminophen and Motrin.  It is not controlling his pain.  He does not want to take any opioids.  We discussed other medications such as gabapentin which is successful in improving postherpetic neuralgia.  He also would like to try indomethacin.  His experience with the pharmacist has been that this is worked at 25 mg 3 times daily.  We discussed the GI side effects and he would like to proceed.     Patient Active Problem List   Diagnosis Date Noted  . Herpes zoster without complication 79/76/1607  . Hypercholesterolemia 03/25/2016  . Grade 1 follicular lymphoma of lymph nodes of multiple regions ([place]) 02/16/2015  . CAD in native artery 08/18/2014  . Hearing loss 07/11/2012  . Insomnia,  idiopathic 07/11/2012  . Erectile dysfunction 07/11/2012  . Non-STEMI (non-ST elevated myocardial infarction) ([place]) 03/06/2012  . Lymphoma ([place]) 06/05/2011    Class: Chronic    Past Medical History:  Diagnosis Date  . Cancer ([place])   . Coronary atherosclerosis of native coronary artery   . Encounter for antineoplastic chemotherapy 06/20/2015  . GERD (gastroesophageal reflux disease)   . History of heart attack   . Hyperlipidemia   . Myocardial infarction ([place])   . Non Hodgkin's lymphoma ([place])     Current Outpatient Medications  Medication Sig Dispense Refill  . aspirin EC 81 MG tablet Take 81 mg by mouth at bedtime.    Marland Kitchen atorvastatin (LIPITOR) 40 MG tablet TAKE 1 TABLET BY MOUTH DAILY AT 6 PM 90 tablet 3  . diphenhydrAMINE (BENADRYL) 25 MG tablet Take 25 mg by mouth at bedtime as needed for sleep.     Marland Kitchen losartan (COZAAR) 50 MG tablet Take 1 tablet (50 mg total) by mouth daily. 90 tablet 3  . omeprazole (PRILOSEC) 20 MG capsule Take 20 mg by mouth daily as needed (acid relfux).    . Polyethylene Glycol 3350 (PEG 3350) POWD Take by mouth.    . prochlorperazine (COMPAZINE) 10 MG tablet Take 1 tablet (10 mg total) by mouth every 6 (six) hours as needed for nausea or vomiting. 30 tablet 0  . sildenafil (VIAGRA) 100 MG tablet Take 100 mg by mouth daily as needed.  3  . traZODone (DESYREL) 50 MG tablet TAKE 1/2 TO 1 TABLETS BY  MOUTH AT BEDTIME AS NEEDED FOR SLEEP 90 tablet 2  . valACYclovir (VALTREX) 1000 MG tablet Take 1 tablet (1,000 mg total) by mouth 2 (two) times daily. 20 tablet 0   No current facility-administered medications for this visit.    Allergies  Allergen Reactions  . Neosporin [Neomycin-Bacitracin Zn-Polymyx] Anaphylaxis    Social History   Socioeconomic History  . Marital status: Married    Spouse name: Not on file  . Number of children: Not on file  . Years of education: Not on file  . Highest education level: Not on file  Occupational History  . Occupation:  pharmacist  Tobacco Use  . Smoking status: Never Smoker  . Smokeless tobacco: Never Used  Substance and Sexual Activity  . Alcohol use: Yes    Comment: occasionally  . Drug use: No  . Sexual activity: Yes  Other Topics Concern  . 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       Tobacco: never      Alcohol:  Socially; almost none in 2017      Exercise in door track, cardio machine and treadmill 1-2 times per week      ADLs: independent with ADLs;       Advanced Directives: no; desires FULL CODE.    Social Determinants of Health   Financial Resource Strain:   . Difficulty of Paying Living Expenses: Not on file  Food Insecurity:   . Worried About Charity fundraiser in the Last Year: Not on file  . Ran Out of Food in the Last Year: Not on file  Transportation Needs:   . Lack of Transportation (Medical): Not on file  . Lack of Transportation (Non-Medical): Not on file  Physical Activity:   . Days of Exercise per Week: Not on file  . Minutes of Exercise per Session: Not on file  Stress:   . Feeling of Stress : Not on file  Social Connections:   . Frequency of Communication with Friends and Family: Not on file  . Frequency of Social Gatherings with Friends and Family: Not on file  . Attends Religious Services: Not on file  . Active Member of Clubs or Organizations: Not on file  . Attends Archivist Meetings: Not on file  . Marital Status: Not on file  Intimate Partner Violence:   . Fear of Current or Ex-Partner: Not on file  . Emotionally Abused: Not on file  . Physically Abused: Not on file  . Sexually Abused: Not on file    ROS   Review of Systems See HPI Constitution: No  fevers or chills No malaise No diaphoresis Skin: No rash or itching Eyes: no blurry vision, no double vision GU: no dysuria or hematuria Neuro: no dizziness or headaches + for post-herpetic neuralgia    Objective     GEN: WDWN, NAD, Non-toxic, Alert & Oriented x 3 HEENT: Atraumatic, Normocephalic.  Ears and Nose: No external deformity. EXTR: No clubbing/cyanosis/edema NEURO: Normal gait.  PSYCH: Normally interactive. Conversant. Not depressed or anxious appearing.  Calm demeanor.    Vitals as reported by the patient: There were no vitals filed for this visit.  Eric [name] was seen today for shingles pain.  Diagnoses and all orders for this visit:  Herpes zoster without complication  He does have 300 mg of gabapentin at home.  Suggested that he use this at night.  If he needs to increase it or reaches a plateau would recommend increasing by 300 mg every 3 days if he can tolerate.  He verbalized understanding.  We will follow-up with Dr. Mitchel Honour as needed Meds ordered this encounter  Medications  . indomethacin (INDOCIN) 25 MG capsule    Sig: Take 1 capsule (25 mg total) by mouth 3 (three) times daily with meals for 5 days.    Dispense:  15 capsule    Refill:  0      I discussed the assessment and treatment plan with the patient. The patient was provided an opportunity to ask questions and all were answered. The patient agreed with the plan and demonstrated an understanding of the instructions.   The patient was advised to call back or seek an in-person evaluation if the symptoms worsen or if the condition fails to improve as anticipated.  I provided 10 minutes of non-face-to-face time during this encounter.  Glyn Ade, NP  [facility] at [facility]

## 2019-06-30 ENCOUNTER — Inpatient Hospital Stay: Payer: Medicare Other | Admitting: Internal Medicine

## 2019-06-30 ENCOUNTER — Inpatient Hospital Stay: Payer: Medicare Other | Attending: Internal Medicine

## 2019-06-30 ENCOUNTER — Encounter: Payer: Self-pay | Admitting: Internal Medicine

## 2019-06-30 ENCOUNTER — Other Ambulatory Visit: Payer: Self-pay

## 2019-06-30 VITALS — BP 117/73 | HR 63 | Temp 98.3°F | Resp 18 | Ht 67.0 in | Wt 159.9 lb

## 2019-06-30 DIAGNOSIS — C8100 Nodular lymphocyte predominant Hodgkin lymphoma, unspecified site: Secondary | ICD-10-CM

## 2019-06-30 DIAGNOSIS — Z79899 Other long term (current) drug therapy: Secondary | ICD-10-CM | POA: Insufficient documentation

## 2019-06-30 DIAGNOSIS — I2510 Atherosclerotic heart disease of native coronary artery without angina pectoris: Secondary | ICD-10-CM | POA: Diagnosis not present

## 2019-06-30 DIAGNOSIS — C3490 Malignant neoplasm of unspecified part of unspecified bronchus or lung: Secondary | ICD-10-CM | POA: Diagnosis not present

## 2019-06-30 DIAGNOSIS — C8208 Follicular lymphoma grade I, lymph nodes of multiple sites: Secondary | ICD-10-CM | POA: Diagnosis not present

## 2019-06-30 DIAGNOSIS — I252 Old myocardial infarction: Secondary | ICD-10-CM | POA: Diagnosis not present

## 2019-06-30 DIAGNOSIS — D649 Anemia, unspecified: Secondary | ICD-10-CM | POA: Diagnosis not present

## 2019-06-30 DIAGNOSIS — Z8572 Personal history of non-Hodgkin lymphomas: Secondary | ICD-10-CM | POA: Insufficient documentation

## 2019-06-30 LAB — CBC WITH DIFFERENTIAL (CANCER CENTER ONLY)
Abs Immature Granulocytes: 0.04 10*3/uL (ref 0.00–0.07)
Basophils Absolute: 0.1 10*3/uL (ref 0.0–0.1)
Basophils Relative: 1 %
Eosinophils Absolute: 0.3 10*3/uL (ref 0.0–0.5)
Eosinophils Relative: 5 %
HCT: 37.2 % — ABNORMAL LOW (ref 39.0–52.0)
Hemoglobin: 12.4 g/dL — ABNORMAL LOW (ref 13.0–17.0)
Immature Granulocytes: 1 %
Lymphocytes Relative: 30 %
Lymphs Abs: 1.9 10*3/uL (ref 0.7–4.0)
MCH: 29.7 pg (ref 26.0–34.0)
MCHC: 33.3 g/dL (ref 30.0–36.0)
MCV: 89.2 fL (ref 80.0–100.0)
Monocytes Absolute: 0.9 10*3/uL (ref 0.1–1.0)
Monocytes Relative: 15 %
Neutro Abs: 3 10*3/uL (ref 1.7–7.7)
Neutrophils Relative %: 48 %
Platelet Count: 266 10*3/uL (ref 150–400)
RBC: 4.17 MIL/uL — ABNORMAL LOW (ref 4.22–5.81)
RDW: 15.3 % (ref 11.5–15.5)
WBC Count: 6.3 10*3/uL (ref 4.0–10.5)
nRBC: 0 % (ref 0.0–0.2)

## 2019-06-30 LAB — CMP (CANCER CENTER ONLY)
ALT: 18 U/L (ref 0–44)
AST: 20 U/L (ref 15–41)
Albumin: 3.6 g/dL (ref 3.5–5.0)
Alkaline Phosphatase: 140 U/L — ABNORMAL HIGH (ref 38–126)
Anion gap: 8 (ref 5–15)
BUN: 14 mg/dL (ref 8–23)
CO2: 25 mmol/L (ref 22–32)
Calcium: 8.7 mg/dL — ABNORMAL LOW (ref 8.9–10.3)
Chloride: 109 mmol/L (ref 98–111)
Creatinine: 1.12 mg/dL (ref 0.61–1.24)
GFR, Est AFR Am: >60 mL/min (ref >60)
GFR, Estimated: >60 mL/min (ref >60)
Glucose, Bld: 73 mg/dL (ref 70–99)
Potassium: 4.2 mmol/L (ref 3.5–5.1)
Sodium: 142 mmol/L (ref 135–145)
Total Bilirubin: 0.4 mg/dL (ref 0.3–1.2)
Total Protein: 6 g/dL — ABNORMAL LOW (ref 6.5–8.1)

## 2019-06-30 LAB — LACTATE DEHYDROGENASE: LDH: 195 U/L — ABNORMAL HIGH (ref 98–192)

## 2019-06-30 NOTE — Progress Notes (Signed)
[place] [phone]   [phone]  OFFICE PROGRESS NOTE  DIAGNOSIS: Bulky stage IV low-grade lymphoma diagnosed in November 2009.  PRIOR THERAPY: 1. Status post 7 cycles of systemic chemotherapy with CHOP/Rituxan. Last dose was given May 2010. 2. Status post 6 cycles of maintenance Rituxan 375 mg/sq m given every 2 months. Last dose was given on May 29, 2010, and the patient was lost to followup at that time. He received another dose on 12/11/2010, then again missed 2 doses. 3. systemic chemotherapy with Rituxan 375 mg/M2 on day 1 and that bendamustine 90 mg/M2 on days 1 and 2 every 4 weeks, status post 6 cycles. 4. Maintenance Rituxan 375 mg/M2 every 2 months, status post 12 cycles  5. Rituxan 375 MG/M2 given weekly for 4 weeks and this is followed by maintenance treatment every 2 months, status post 22 cycles discontinued secondary to disease progression. 6. Systemic chemotherapy with bendamustine 90 mg/M2 on days 1 and 2 and Rituxan 375 mg/M2 on day 1 every 4 weeks.  First dose March 03, 2018.  Status post 4 cycles.  CURRENT THERAPY: Observation.  INTERVAL HISTORY: Eric [name] 79 y.o. male returns to the clinic today for 6 months follow-up visit.  The patient is feeling fine today with no concerning complaints.  He denied having any significant chest pain, shortness of breath, cough or hemoptysis.  He denied having any fever or chills.  He has no nausea, vomiting, diarrhea or constipation.  He denied having any palpable lymphadenopathy.  He is here today for evaluation and repeat blood work.  MEDICAL HISTORY: Past Medical History:  Diagnosis Date  . Cancer ([place])   . Coronary atherosclerosis of native coronary artery   . Encounter for antineoplastic chemotherapy 06/20/2015  . GERD (gastroesophageal reflux disease)   . History of heart attack   . Hyperlipidemia   . Myocardial infarction ([place])   . Non Hodgkin's lymphoma ([place])      ALLERGIES:  is allergic to neosporin [neomycin-bacitracin zn-polymyx].  MEDICATIONS:  Current Outpatient Medications  Medication Sig Dispense Refill  . aspirin EC 81 MG tablet Take 81 mg by mouth at bedtime.    Marland Kitchen atorvastatin (LIPITOR) 40 MG tablet TAKE 1 TABLET BY MOUTH DAILY AT 6 PM 90 tablet 3  . diphenhydrAMINE (BENADRYL) 25 MG tablet Take 25 mg by mouth at bedtime as needed for sleep.     Marland Kitchen losartan (COZAAR) 50 MG tablet Take 1 tablet (50 mg total) by mouth daily. 90 tablet 3  . omeprazole (PRILOSEC) 20 MG capsule Take 20 mg by mouth daily as needed (acid relfux).    . Polyethylene Glycol 3350 (PEG 3350) POWD Take by mouth.    . prochlorperazine (COMPAZINE) 10 MG tablet Take 1 tablet (10 mg total) by mouth every 6 (six) hours as needed for nausea or vomiting. 30 tablet 0  . sildenafil (VIAGRA) 100 MG tablet Take 100 mg by mouth daily as needed.  3   No current facility-administered medications for this visit.    SURGICAL HISTORY:  Past Surgical History:  Procedure Laterality Date  . CORONARY STENT PLACEMENT    . LEFT HEART CATHETERIZATION WITH CORONARY ANGIOGRAM N/A 03/03/2012   Procedure: LEFT HEART CATHETERIZATION WITH CORONARY ANGIOGRAM;  Surgeon: [name], MD;  Location: [facility] CATH LAB;  Service: Cardiovascular;  Laterality: N/A;  . PERCUTANEOUS CORONARY STENT INTERVENTION (PCI-S) N/A 03/04/2012   Procedure: PERCUTANEOUS CORONARY STENT INTERVENTION (PCI-S);  Surgeon: [name], MD;  Location: [facility] CATH LAB;  Service: Cardiovascular;  Laterality: N/A;    REVIEW OF SYSTEMS:  A comprehensive review of systems was negative.   PHYSICAL EXAMINATION: General appearance: alert, cooperative and no distress Head: Normocephalic, without obvious abnormality, atraumatic Neck: no adenopathy, no JVD, supple, symmetrical, trachea midline and thyroid not enlarged, symmetric, no tenderness/mass/nodules Lymph nodes: Cervical, supraclavicular, and axillary nodes normal. Resp: clear  to auscultation bilaterally Back: symmetric, no curvature. ROM normal. No CVA tenderness. Cardio: regular rate and rhythm, S1, S2 normal, no murmur, click, rub or gallop GI: soft, non-tender; bowel sounds normal; no masses,  no organomegaly Extremities: extremities normal, atraumatic, no cyanosis or edema  ECOG PERFORMANCE STATUS: 0 - Asymptomatic  Blood pressure 117/73, pulse 63, temperature 98.3 F (36.8 C), temperature source Temporal, resp. rate 18, height 5\' 7"  (1.702 m), weight 159 lb 14.4 oz (72.5 kg), SpO2 100 %.  LABORATORY DATA: Lab Results  Component Value Date   WBC 6.3 06/30/2019   HGB 12.4 (L) 06/30/2019   HCT 37.2 (L) 06/30/2019   MCV 89.2 06/30/2019   PLT 266 06/30/2019      Chemistry      Component Value Date/Time   NA 144 02/16/2019 1000   NA 140 02/14/2017 0804   K 3.8 02/16/2019 1000   K 4.4 02/14/2017 0804   CL 105 02/16/2019 1000   CL 106 08/06/2012 1244   CO2 24 02/16/2019 1000   CO2 22 02/14/2017 0804   BUN 15 02/16/2019 1000   BUN 14.8 02/14/2017 0804   CREATININE 1.13 02/16/2019 1000   CREATININE 1.00 12/26/2018 1155   CREATININE 1.1 02/14/2017 0804      Component Value Date/Time   CALCIUM 9.1 02/16/2019 1000   CALCIUM 9.2 02/14/2017 0804   ALKPHOS 145 (H) 12/26/2018 1155   ALKPHOS 138 02/14/2017 0804   AST 28 12/26/2018 1155   AST 29 02/14/2017 0804   ALT 21 12/26/2018 1155   ALT 27 02/14/2017 0804   BILITOT 0.5 12/26/2018 1155   BILITOT 0.61 02/14/2017 0804       RADIOGRAPHIC STUDIES: No results found.  ASSESSMENT AND PLAN:  This is a very pleasant 79 years old African-American male with bulky stage IV follicular lymphoma initially treated with CHOP/Rituxan followed by maintenance Rituxan. The patient had disease recurrence and he was restarted again on treatment with Rituxan every 2 months status post 22 cycles. The patient has been tolerating this treatment well with no concerning adverse effects. The patient was found to have  progression in December 2019.  He was started on systemic chemotherapy with bendamustine and Rituxan status post 4 cycles.  He tolerated the fourth cycle of his treatment well except for mild fatigue. The patient is currently on observation and he has no concerning complaints. His lab work is unremarkable except for mild anemia. I recommended for the patient to continue on observation with repeat CT scan of the chest, abdomen and pelvis in 6 months for restaging of his disease. He was advised to call immediately if he has any concerning symptoms in the interval. The patient voices understanding of current disease status and treatment options and is in agreement with the current care plan. All questions were answered. The patient knows to call the clinic with any problems, questions or concerns. We can certainly see the patient much sooner if necessary.   Disclaimer: This note was dictated with voice recognition software. Similar sounding words can inadvertently be transcribed and may not be corrected upon review.

## 2019-07-01 ENCOUNTER — Telehealth: Payer: Self-pay | Admitting: Internal Medicine

## 2019-07-01 NOTE — Telephone Encounter (Signed)
Scheduled per los. Called and left msg. Mailed printout  °

## 2019-07-06 DIAGNOSIS — N529 Male erectile dysfunction, unspecified: Secondary | ICD-10-CM | POA: Diagnosis not present

## 2019-07-06 DIAGNOSIS — N401 Enlarged prostate with lower urinary tract symptoms: Secondary | ICD-10-CM | POA: Diagnosis not present

## 2019-07-06 DIAGNOSIS — R972 Elevated prostate specific antigen [PSA]: Secondary | ICD-10-CM | POA: Diagnosis not present

## 2019-07-06 DIAGNOSIS — N138 Other obstructive and reflux uropathy: Secondary | ICD-10-CM | POA: Diagnosis not present

## 2019-10-13 ENCOUNTER — Encounter: Payer: Self-pay | Admitting: Emergency Medicine

## 2019-10-14 ENCOUNTER — Telehealth: Payer: Self-pay

## 2019-10-14 ENCOUNTER — Other Ambulatory Visit: Payer: Self-pay

## 2019-10-14 ENCOUNTER — Other Ambulatory Visit: Payer: Self-pay | Admitting: Emergency Medicine

## 2019-10-14 DIAGNOSIS — G4700 Insomnia, unspecified: Secondary | ICD-10-CM

## 2019-10-14 DIAGNOSIS — N049 Nephrotic syndrome with unspecified morphologic changes: Secondary | ICD-10-CM

## 2019-10-14 MED ORDER — TRAZODONE HCL 50 MG PO TABS: 25.0000 mg | ORAL_TABLET | Freq: Every evening | ORAL | 0 refills | Status: DC | PRN

## 2019-10-14 NOTE — Telephone Encounter (Signed)
Called pt and sch appt for med refill and toc sch for 12/15/19

## 2019-10-14 NOTE — Telephone Encounter (Signed)
Please contact pt to schedule him an appt.  Thank you

## 2019-10-15 ENCOUNTER — Ambulatory Visit (INDEPENDENT_AMBULATORY_CARE_PROVIDER_SITE_OTHER): Payer: Medicare Other | Admitting: Emergency Medicine

## 2019-10-15 ENCOUNTER — Encounter: Payer: Self-pay | Admitting: Emergency Medicine

## 2019-10-15 ENCOUNTER — Other Ambulatory Visit: Payer: Self-pay

## 2019-10-15 VITALS — BP 123/68 | HR 88 | Temp 98.1°F | Resp 14 | Ht 67.0 in | Wt 160.2 lb

## 2019-10-15 DIAGNOSIS — G4700 Insomnia, unspecified: Secondary | ICD-10-CM | POA: Diagnosis not present

## 2019-10-15 MED ORDER — TRAZODONE HCL 100 MG PO TABS: 100.0000 mg | ORAL_TABLET | Freq: Every evening | ORAL | 1 refills | Status: DC | PRN

## 2019-10-15 NOTE — Patient Instructions (Addendum)
If you have lab work done today you will be contacted with your lab results within the next 2 weeks.  If you have not heard from [place] then please contact us. The fastest way to get your results is to register for My Chart.   IF you received an x-ray today, you will receive an invoice from [facility]. Please contact [facility] at [phone] with questions or concerns regarding your invoice.   IF you received labwork today, you will receive an invoice from [facility]. Please contact [facility] at [phone] with questions or concerns regarding your invoice.   Our billing staff will not be able to assist you with questions regarding bills from these companies.  You will be contacted with the lab results as soon as they are available. The fastest way to get your results is to activate your My Chart account. Instructions are located on the last page of this paperwork. If you have not heard from [place] regarding the results in 2 weeks, please contact this office.     Insomnia Insomnia is a sleep disorder that makes it difficult to fall asleep or stay asleep. Insomnia can cause fatigue, low energy, difficulty concentrating, mood swings, and poor performance at work or school. There are three different ways to classify insomnia:  Difficulty falling asleep.  Difficulty staying asleep.  Waking up too early in the morning. Any type of insomnia can be long-term (chronic) or short-term (acute). Both are common. Short-term insomnia usually lasts for three months or less. Chronic insomnia occurs at least three times a week for longer than three months. What are the causes? Insomnia may be caused by another condition, situation, or substance, such as:  Anxiety.  Certain medicines.  Gastroesophageal reflux disease (GERD) or other gastrointestinal conditions.  Asthma or other breathing conditions.  Restless legs syndrome, sleep apnea, or other sleep disorders.  Chronic  pain.  Menopause.  Stroke.  Abuse of alcohol, tobacco, or illegal drugs.  Mental health conditions, such as depression.  Caffeine.  Neurological disorders, such as Alzheimer's disease.  An overactive thyroid (hyperthyroidism). Sometimes, the cause of insomnia may not be known. What increases the risk? Risk factors for insomnia include:  Gender. Women are affected more often than men.  Age. Insomnia is more common as you get older.  Stress.  Lack of exercise.  Irregular work schedule or working night shifts.  Traveling between different time zones.  Certain medical and mental health conditions. What are the signs or symptoms? If you have insomnia, the main symptom is having trouble falling asleep or having trouble staying asleep. This may lead to other symptoms, such as:  Feeling fatigued or having low energy.  Feeling nervous about going to sleep.  Not feeling rested in the morning.  Having trouble concentrating.  Feeling irritable, anxious, or depressed. How is this diagnosed? This condition may be diagnosed based on:  Your symptoms and medical history. Your health care provider may ask about: ? Your sleep habits. ? Any medical conditions you have. ? Your mental health.  A physical exam. How is this treated? Treatment for insomnia depends on the cause. Treatment may focus on treating an underlying condition that is causing insomnia. Treatment may also include:  Medicines to help you sleep.  Counseling or therapy.  Lifestyle adjustments to help you sleep better. Follow these instructions at home: Eating and drinking   Limit or avoid alcohol, caffeinated beverages, and cigarettes, especially close to bedtime. These can disrupt your sleep.  Do not eat a large meal or eat spicy foods right before bedtime. This can lead to digestive discomfort that can make it hard for you to sleep. Sleep habits   Keep a sleep diary to help you and your health care  provider figure out what could be causing your insomnia. Write down: ? When you sleep. ? When you wake up during the night. ? How well you sleep. ? How rested you feel the next day. ? Any side effects of medicines you are taking. ? What you eat and drink.  Make your bedroom a dark, comfortable place where it is easy to fall asleep. ? Put up shades or blackout curtains to block light from outside. ? Use a white noise machine to block noise. ? Keep the temperature cool.  Limit screen use before bedtime. This includes: ? Watching TV. ? Using your smartphone, tablet, or computer.  Stick to a routine that includes going to bed and waking up at the same times every day and night. This can help you fall asleep faster. Consider making a quiet activity, such as reading, part of your nighttime routine.  Try to avoid taking naps during the day so that you sleep better at night.  Get out of bed if you are still awake after 15 minutes of trying to sleep. Keep the lights down, but try reading or doing a quiet activity. When you feel sleepy, go back to bed. General instructions  Take over-the-counter and prescription medicines only as told by your health care provider.  Exercise regularly, as told by your health care provider. Avoid exercise starting several hours before bedtime.  Use relaxation techniques to manage stress. Ask your health care provider to suggest some techniques that may work well for you. These may include: ? Breathing exercises. ? Routines to release muscle tension. ? Visualizing peaceful scenes.  Make sure that you drive carefully. Avoid driving if you feel very sleepy.  Keep all follow-up visits as told by your health care provider. This is important. Contact a health care provider if:  You are tired throughout the day.  You have trouble in your daily routine due to sleepiness.  You continue to have sleep problems, or your sleep problems get worse. Get help right  away if:  You have serious thoughts about hurting yourself or someone else. If you ever feel like you may hurt yourself or others, or have thoughts about taking your own life, get help right away. You can go to your nearest emergency department or call:  Your local emergency services (911 in the [place]  A suicide crisis helpline, such as the [facility] at 938-858-3464. This is open 24 hours a day. Summary  Insomnia is a sleep disorder that makes it difficult to fall asleep or stay asleep.  Insomnia can be long-term (chronic) or short-term (acute).  Treatment for insomnia depends on the cause. Treatment may focus on treating an underlying condition that is causing insomnia.  Keep a sleep diary to help you and your health care provider figure out what could be causing your insomnia. This information is not intended to replace advice given to you by your health care provider. Make sure you discuss any questions you have with your health care provider. Document Revised: 02/01/2017 Document Reviewed: 11/29/2016 Elsevier Patient Education  2020 [employer].

## 2019-10-15 NOTE — Progress Notes (Signed)
[name] 79 y.o.   Chief Complaint  Patient presents with   Insomnia    pt would like you to refill Trazadone, but at 100 mg in place of the 50mg  as he would like to break them in half     HISTORY OF PRESENT ILLNESS: This is a 79 y.o. male with history of chronic insomnia needs trazodone refill. No other complaints or medical concerns today.  HPI   Prior to Admission medications   Medication Sig Start Date End Date Taking? Authorizing Provider  aspirin EC 81 MG tablet Take 81 mg by mouth at bedtime.   Yes [provider]  atorvastatin (LIPITOR) 40 MG tablet TAKE 1 TABLET BY MOUTH DAILY AT 6 PM 04/13/19  Yes Belva Crome, MD  diphenhydrAMINE (BENADRYL) 25 MG tablet Take 25 mg by mouth at bedtime as needed for sleep.    Yes [provider]  omeprazole (PRILOSEC) 20 MG capsule Take 20 mg by mouth daily as needed (acid relfux).   Yes [provider]  Polyethylene Glycol 3350 (PEG 3350) POWD Take by mouth. 02/11/18  Yes [provider]  sildenafil (VIAGRA) 100 MG tablet Take 100 mg by mouth daily as needed. 12/10/17  Yes [provider]  losartan (COZAAR) 50 MG tablet Take 1 tablet (50 mg total) by mouth daily. 01/26/19 04/26/19  Belva Crome, MD  prochlorperazine (COMPAZINE) 10 MG tablet Take 1 tablet (10 mg total) by mouth every 6 (six) hours as needed for nausea or vomiting. Patient not taking: Reported on 10/15/2019 02/20/18   Curt Bears, MD  traZODone (DESYREL) 100 MG tablet Take 1 tablet (100 mg total) by mouth at bedtime as needed for sleep. 10/15/19   Horald Pollen, MD    Allergies  Allergen Reactions   Neosporin [Neomycin-Bacitracin Zn-Polymyx] Anaphylaxis    Patient Active Problem List   Diagnosis Date Noted   Herpes zoster without complication 11/94/1740   Hypercholesterolemia 81/44/8185   Grade 1 follicular lymphoma of lymph nodes of multiple regions ([place]) 02/16/2015   CAD in native artery 08/18/2014     Hearing loss 07/11/2012   Insomnia, idiopathic 07/11/2012   Erectile dysfunction 07/11/2012   Non-STEMI (non-ST elevated myocardial infarction) ([place]) 03/06/2012   Lymphoma ([place]) 06/05/2011    Class: Chronic    Past Medical History:  Diagnosis Date   Cancer ([place])    Coronary atherosclerosis of native coronary artery    Encounter for antineoplastic chemotherapy 06/20/2015   GERD (gastroesophageal reflux disease)    History of heart attack    Hyperlipidemia    Myocardial infarction ([place])    Non Hodgkin's lymphoma ([place])     Past Surgical History:  Procedure Laterality Date   CORONARY STENT PLACEMENT     LEFT HEART CATHETERIZATION WITH CORONARY ANGIOGRAM N/A 03/03/2012   Procedure: LEFT HEART CATHETERIZATION WITH CORONARY ANGIOGRAM;  Surgeon: [name], MD;  Location[facility] CATH LAB;  Service: Cardiovascular;  Laterality: N/A;   PERCUTANEOUS CORONARY STENT INTERVENTION (PCI-S) N/A 03/04/2012   Procedure: PERCUTANEOUS CORONARY STENT INTERVENTION (PCI-S);  Surgeon: [name], MD;  Location: [facility] CATH LAB;  Service: Cardiovascular;  Laterality: N/A;    Social History   Socioeconomic History   Marital status: Married    Spouse name: Not on file   Number of children: Not on file   Years of education: Not on file   Highest education level: Not on file  Occupational History   Occupation: pharmacist  Tobacco Use   Smoking status:  Never Smoker   Smokeless tobacco: Never Used  Vaping Use   Vaping Use: Never used  Substance and Sexual Activity   Alcohol use: Yes    Comment: occasionally   Drug use: No   Sexual activity: Yes  Other Topics Concern   Not on file  Social History Narrative   Marital status: married since 87 years; from [place], [place]; two hours from [place]; below [place]: 2 children (30, 27); no grandchildren      Lives: with wife, 2 children      Employment: Software engineer; independent Restaurant manager, 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       Tobacco: never      Alcohol:  Socially; almost none in 2017      Exercise in door track, cardio machine and treadmill 1-2 times per week      ADLs: independent with ADLs;       Advanced Directives: no; desires FULL CODE.    Social Determinants of Health   Financial Resource Strain:    Difficulty of Paying Living Expenses:   Food Insecurity:    Worried About Charity fundraiser in the Last Year:    Arboriculturist in the Last Year:   Transportation Needs:    Film/video editor (Medical):    Lack of Transportation (Non-Medical):   Physical Activity:    Days of Exercise per Week:    Minutes of Exercise per Session:   Stress:    Feeling of Stress :   Social Connections:    Frequency of Communication with Friends and Family:    Frequency of Social Gatherings with Friends and Family:    Attends Religious Services:    Active Member of Clubs or Organizations:    Attends Music therapist:    Marital Status:   Intimate Partner Violence:    Fear of Current or Ex-Partner:    Emotionally Abused:    Physically Abused:    Sexually Abused:     Family History  Problem Relation Age of Onset   Leukemia Mother      Review of Systems  Constitutional: Negative.  Negative for chills and fever.  HENT: Negative.  Negative for congestion and sore throat.   Respiratory: Negative.  Negative for cough and shortness of breath.   Cardiovascular: Negative.  Negative for chest pain and palpitations.  Gastrointestinal: Negative.  Negative for abdominal pain, diarrhea, nausea and vomiting.  Genitourinary: Negative.  Negative for dysuria and hematuria.  Musculoskeletal: Negative.  Negative for back pain, myalgias and neck pain.  Skin: Negative.   Neurological: Negative.  Negative for dizziness and headaches.  All other systems reviewed and are negative.  Today's Vitals   10/15/19 1136  BP: 123/68  Pulse: 88  Resp: 14  Temp:  98.1 F (36.7 C)  TempSrc: Temporal  SpO2: 98%  Weight: 160 lb 3.2 oz (72.7 kg)  Height: 5\' 7"  (1.702 m)   Body mass index is 25.09 kg/m.   Physical Exam Vitals reviewed.  Constitutional:      Appearance: Normal appearance.  HENT:     Head: Normocephalic.  Eyes:     Extraocular Movements: Extraocular movements intact.     Pupils: Pupils are equal, round, and reactive to light.  Cardiovascular:     Rate and Rhythm: Normal rate and regular rhythm.     Pulses: Normal pulses.     Heart sounds: Normal heart sounds.  Pulmonary:  Effort: Pulmonary effort is normal.     Breath sounds: Normal breath sounds.  Musculoskeletal:        General: Normal range of motion.     Cervical back: Normal range of motion and neck supple.  Skin:    General: Skin is warm and dry.     Capillary Refill: Capillary refill takes less than 2 seconds.  Neurological:     General: No focal deficit present.     Mental Status: He is alert and oriented to person, place, and time.  Psychiatric:        Mood and Affect: Mood normal.      ASSESSMENT & PLAN: Ralphael was seen today for insomnia.  Diagnoses and all orders for this visit:  Insomnia, unspecified type -     traZODone (DESYREL) 100 MG tablet; Take 1 tablet (100 mg total) by mouth at bedtime as needed for sleep.    Patient Instructions       If you have lab work done today you will be contacted with your lab results within the next 2 weeks.  If you have not heard from [place] then please contact us. The fastest way to get your results is to register for My Chart.   IF you received an x-ray today, you will receive an invoice from [facility]. Please contact [facility] at [phone] with questions or concerns regarding your invoice.   IF you received labwork today, you will receive an invoice from [place]. Please contact [facility] at [phone] with questions or concerns regarding your invoice.   Our billing staff  will not be able to assist you with questions regarding bills from these companies.  You will be contacted with the lab results as soon as they are available. The fastest way to get your results is to activate your My Chart account. Instructions are located on the last page of this paperwork. If you have not heard from [place] regarding the results in 2 weeks, please contact this office.     Insomnia Insomnia is a sleep disorder that makes it difficult to fall asleep or stay asleep. Insomnia can cause fatigue, low energy, difficulty concentrating, mood swings, and poor performance at work or school. There are three different ways to classify insomnia:  Difficulty falling asleep.  Difficulty staying asleep.  Waking up too early in the morning. Any type of insomnia can be long-term (chronic) or short-term (acute). Both are common. Short-term insomnia usually lasts for three months or less. Chronic insomnia occurs at least three times a week for longer than three months. What are the causes? Insomnia may be caused by another condition, situation, or substance, such as:  Anxiety.  Certain medicines.  Gastroesophageal reflux disease (GERD) or other gastrointestinal conditions.  Asthma or other breathing conditions.  Restless legs syndrome, sleep apnea, or other sleep disorders.  Chronic pain.  Menopause.  Stroke.  Abuse of alcohol, tobacco, or illegal drugs.  Mental health conditions, such as depression.  Caffeine.  Neurological disorders, such as Alzheimer's disease.  An overactive thyroid (hyperthyroidism). Sometimes, the cause of insomnia may not be known. What increases the risk? Risk factors for insomnia include:  Gender. Women are affected more often than men.  Age. Insomnia is more common as you get older.  Stress.  Lack of exercise.  Irregular work schedule or working night shifts.  Traveling between different time zones.  Certain medical and mental health  conditions. What are the signs or symptoms? If you have insomnia, the main  symptom is having trouble falling asleep or having trouble staying asleep. This may lead to other symptoms, such as:  Feeling fatigued or having low energy.  Feeling nervous about going to sleep.  Not feeling rested in the morning.  Having trouble concentrating.  Feeling irritable, anxious, or depressed. How is this diagnosed? This condition may be diagnosed based on:  Your symptoms and medical history. Your health care provider may ask about: ? Your sleep habits. ? Any medical conditions you have. ? Your mental health.  A physical exam. How is this treated? Treatment for insomnia depends on the cause. Treatment may focus on treating an underlying condition that is causing insomnia. Treatment may also include:  Medicines to help you sleep.  Counseling or therapy.  Lifestyle adjustments to help you sleep better. Follow these instructions at home: Eating and drinking   Limit or avoid alcohol, caffeinated beverages, and cigarettes, especially close to bedtime. These can disrupt your sleep.  Do not eat a large meal or eat spicy foods right before bedtime. This can lead to digestive discomfort that can make it hard for you to sleep. Sleep habits   Keep a sleep diary to help you and your health care provider figure out what could be causing your insomnia. Write down: ? When you sleep. ? When you wake up during the night. ? How well you sleep. ? How rested you feel the next day. ? Any side effects of medicines you are taking. ? What you eat and drink.  Make your bedroom a dark, comfortable place where it is easy to fall asleep. ? Put up shades or blackout curtains to block light from outside. ? Use a white noise machine to block noise. ? Keep the temperature cool.  Limit screen use before bedtime. This includes: ? Watching TV. ? Using your smartphone, tablet, or computer.  Stick to a routine  that includes going to bed and waking up at the same times every day and night. This can help you fall asleep faster. Consider making a quiet activity, such as reading, part of your nighttime routine.  Try to avoid taking naps during the day so that you sleep better at night.  Get out of bed if you are still awake after 15 minutes of trying to sleep. Keep the lights down, but try reading or doing a quiet activity. When you feel sleepy, go back to bed. General instructions  Take over-the-counter and prescription medicines only as told by your health care provider.  Exercise regularly, as told by your health care provider. Avoid exercise starting several hours before bedtime.  Use relaxation techniques to manage stress. Ask your health care provider to suggest some techniques that may work well for you. These may include: ? Breathing exercises. ? Routines to release muscle tension. ? Visualizing peaceful scenes.  Make sure that you drive carefully. Avoid driving if you feel very sleepy.  Keep all follow-up visits as told by your health care provider. This is important. Contact a health care provider if:  You are tired throughout the day.  You have trouble in your daily routine due to sleepiness.  You continue to have sleep problems, or your sleep problems get worse. Get help right away if:  You have serious thoughts about hurting yourself or someone else. If you ever feel like you may hurt yourself or others, or have thoughts about taking your own life, get help right away. You can go to your nearest emergency department or call:  Your local emergency services (911 in the [place]  A suicide crisis helpline, such as the [place] at (704) 101-1448. This is open 24 hours a day. Summary  Insomnia is a sleep disorder that makes it difficult to fall asleep or stay asleep.  Insomnia can be long-term (chronic) or short-term (acute).  Treatment for insomnia  depends on the cause. Treatment may focus on treating an underlying condition that is causing insomnia.  Keep a sleep diary to help you and your health care provider figure out what could be causing your insomnia. This information is not intended to replace advice given to you by your health care provider. Make sure you discuss any questions you have with your health care provider. Document Revised: 02/01/2017 Document Reviewed: 11/29/2016 Elsevier Patient Education  2020 Elsevier Inc.      Agustina Caroli, MD [facility]

## 2019-11-27 ENCOUNTER — Telehealth: Payer: Self-pay | Admitting: Interventional Cardiology

## 2019-11-27 NOTE — Telephone Encounter (Signed)
Patient states Dr. Tamala Julian wanted him to see a BP team and would like to know if he can schedule an appointment.

## 2019-11-27 NOTE — Telephone Encounter (Signed)
Left message for pt to call back.  Need BP numbers to know if he even still needs to see HTN clinic.

## 2019-12-04 NOTE — Telephone Encounter (Signed)
Spoke with pt and he states BP was doing good for awhile but recently has been elevated.  Lowest it has been is 140/80.  Pt states he has been working on his diet.  Scheduled pt to see pharmacy team on Monday.  Pt appreciative for call.

## 2019-12-07 ENCOUNTER — Ambulatory Visit: Payer: Medicare Other

## 2019-12-07 NOTE — Progress Notes (Deleted)
Patient ID: Eric [name]                 DOB: [birth year]                      MRN: [id number]     HPI: Eric [name] is a 79 y.o. male referred by Dr. Tamala Julian to HTN clinic. PMH is significant for follicular lymphoma,CAD with RCA DES 2013,prior MI,and hyperlipidemia.Patient was last seen by Dr. Tamala Julian 6 months ago. He was started on losartan 50mg  daily. Scr and K have been stable. He called the clinic a few weeks ago stating his BP had been running high and requested visit with HTN clinic.  Current HTN meds: losartan 50mg  daily Previously tried:  BP goal: <130/80  Family History: The patient's family history includes Leukemia in his mother.  Social History: never smoked  Diet:   Exercise:   Home BP readings:   Wt Readings from Last 3 Encounters:  10/15/19 160 lb 3.2 oz (72.7 kg)  06/30/19 159 lb 14.4 oz (72.5 kg)  03/18/19 159 lb (72.1 kg)   BP Readings from Last 3 Encounters:  10/15/19 123/68  06/30/19 117/73  03/18/19 (!) 166/71   Pulse Readings from Last 3 Encounters:  10/15/19 88  06/30/19 63  03/18/19 88    Renal function: CrCl cannot be calculated (Patient's most recent lab result is older than the maximum 21 days allowed.).  Past Medical History:  Diagnosis Date  . Cancer ([place])   . Coronary atherosclerosis of native coronary artery   . Encounter for antineoplastic chemotherapy 06/20/2015  . GERD (gastroesophageal reflux disease)   . History of heart attack   . Hyperlipidemia   . Myocardial infarction ([place])   . Non Hodgkin's lymphoma ([place])     Current Outpatient Medications on File Prior to Visit  Medication Sig Dispense Refill  . aspirin EC 81 MG tablet Take 81 mg by mouth at bedtime.    Marland Kitchen atorvastatin (LIPITOR) 40 MG tablet TAKE 1 TABLET BY MOUTH DAILY AT 6 PM 90 tablet 3  . diphenhydrAMINE (BENADRYL) 25 MG tablet Take 25 mg by mouth at bedtime as needed for sleep.     Marland Kitchen losartan (COZAAR) 50 MG tablet Take 1 tablet (50 mg total) by mouth daily. 90  tablet 3  . omeprazole (PRILOSEC) 20 MG capsule Take 20 mg by mouth daily as needed (acid relfux).    . Polyethylene Glycol 3350 (PEG 3350) POWD Take by mouth.    . prochlorperazine (COMPAZINE) 10 MG tablet Take 1 tablet (10 mg total) by mouth every 6 (six) hours as needed for nausea or vomiting. (Patient not taking: Reported on 10/15/2019) 30 tablet 0  . sildenafil (VIAGRA) 100 MG tablet Take 100 mg by mouth daily as needed.  3  . traZODone (DESYREL) 100 MG tablet Take 1 tablet (100 mg total) by mouth at bedtime as needed for sleep. 90 tablet 1   No current facility-administered medications on file prior to visit.    Allergies  Allergen Reactions  . Neosporin [Neomycin-Bacitracin Zn-Polymyx] Anaphylaxis     Assessment/Plan:  1. Hypertension -

## 2019-12-15 ENCOUNTER — Encounter: Payer: Medicare Other | Admitting: Emergency Medicine

## 2019-12-28 ENCOUNTER — Other Ambulatory Visit: Payer: Self-pay

## 2019-12-28 ENCOUNTER — Ambulatory Visit (HOSPITAL_COMMUNITY)
Admission: RE | Admit: 2019-12-28 | Discharge: 2019-12-28 | Disposition: A | Discharge status: Home / Self Care | Payer: Medicare Other | Source: Ambulatory Visit | Attending: Internal Medicine | Admitting: Internal Medicine

## 2019-12-28 ENCOUNTER — Encounter (HOSPITAL_COMMUNITY): Payer: Self-pay

## 2019-12-28 ENCOUNTER — Other Ambulatory Visit: Payer: Self-pay | Admitting: Internal Medicine

## 2019-12-28 ENCOUNTER — Inpatient Hospital Stay: Payer: Medicare Other | Attending: Internal Medicine

## 2019-12-28 DIAGNOSIS — K429 Umbilical hernia without obstruction or gangrene: Secondary | ICD-10-CM | POA: Insufficient documentation

## 2019-12-28 DIAGNOSIS — C8590 Non-Hodgkin lymphoma, unspecified, unspecified site: Secondary | ICD-10-CM | POA: Diagnosis not present

## 2019-12-28 DIAGNOSIS — I252 Old myocardial infarction: Secondary | ICD-10-CM | POA: Insufficient documentation

## 2019-12-28 DIAGNOSIS — J479 Bronchiectasis, uncomplicated: Secondary | ICD-10-CM | POA: Diagnosis not present

## 2019-12-28 DIAGNOSIS — E876 Hypokalemia: Secondary | ICD-10-CM | POA: Diagnosis not present

## 2019-12-28 DIAGNOSIS — Z79899 Other long term (current) drug therapy: Secondary | ICD-10-CM | POA: Diagnosis not present

## 2019-12-28 DIAGNOSIS — K449 Diaphragmatic hernia without obstruction or gangrene: Secondary | ICD-10-CM | POA: Insufficient documentation

## 2019-12-28 DIAGNOSIS — N400 Enlarged prostate without lower urinary tract symptoms: Secondary | ICD-10-CM | POA: Insufficient documentation

## 2019-12-28 DIAGNOSIS — C3490 Malignant neoplasm of unspecified part of unspecified bronchus or lung: Secondary | ICD-10-CM

## 2019-12-28 DIAGNOSIS — I700 Atherosclerosis of aorta: Secondary | ICD-10-CM | POA: Diagnosis not present

## 2019-12-28 DIAGNOSIS — C8286 Other types of follicular lymphoma, intrapelvic lymph nodes: Secondary | ICD-10-CM | POA: Diagnosis present

## 2019-12-28 LAB — CMP (CANCER CENTER ONLY)
ALT: 20 U/L (ref 0–44)
AST: 28 U/L (ref 15–41)
Albumin: 3.9 g/dL (ref 3.5–5.0)
Alkaline Phosphatase: 132 U/L — ABNORMAL HIGH (ref 38–126)
Anion gap: 7 (ref 5–15)
BUN: 11 mg/dL (ref 8–23)
CO2: 30 mmol/L (ref 22–32)
Calcium: 9.5 mg/dL (ref 8.9–10.3)
Chloride: 105 mmol/L (ref 98–111)
Creatinine: 1.02 mg/dL (ref 0.61–1.24)
GFR, Estimated: >60 mL/min (ref >60)
Glucose, Bld: 84 mg/dL (ref 70–99)
Potassium: 3.1 mmol/L — ABNORMAL LOW (ref 3.5–5.1)
Sodium: 142 mmol/L (ref 135–145)
Total Bilirubin: 0.6 mg/dL (ref 0.3–1.2)
Total Protein: 6.3 g/dL — ABNORMAL LOW (ref 6.5–8.1)

## 2019-12-28 LAB — CBC WITH DIFFERENTIAL (CANCER CENTER ONLY)
Abs Immature Granulocytes: 0.01 10*3/uL (ref 0.00–0.07)
Basophils Absolute: 0 10*3/uL (ref 0.0–0.1)
Basophils Relative: 1 %
Eosinophils Absolute: 0.2 10*3/uL (ref 0.0–0.5)
Eosinophils Relative: 4 %
HCT: 39.6 % (ref 39.0–52.0)
Hemoglobin: 12.8 g/dL — ABNORMAL LOW (ref 13.0–17.0)
Immature Granulocytes: 0 %
Lymphocytes Relative: 26 %
Lymphs Abs: 1.1 10*3/uL (ref 0.7–4.0)
MCH: 27.6 pg (ref 26.0–34.0)
MCHC: 32.3 g/dL (ref 30.0–36.0)
MCV: 85.5 fL (ref 80.0–100.0)
Monocytes Absolute: 0.7 10*3/uL (ref 0.1–1.0)
Monocytes Relative: 16 %
Neutro Abs: 2.2 10*3/uL (ref 1.7–7.7)
Neutrophils Relative %: 53 %
Platelet Count: 263 10*3/uL (ref 150–400)
RBC: 4.63 MIL/uL (ref 4.22–5.81)
RDW: 15.1 % (ref 11.5–15.5)
WBC Count: 4.2 10*3/uL (ref 4.0–10.5)
nRBC: 0 % (ref 0.0–0.2)

## 2019-12-28 LAB — LACTATE DEHYDROGENASE: LDH: 250 U/L — ABNORMAL HIGH (ref 98–192)

## 2019-12-28 MED ORDER — POTASSIUM CHLORIDE CRYS ER 20 MEQ PO TBCR: 20.0000 meq | EXTENDED_RELEASE_TABLET | Freq: Two times a day (BID) | ORAL | 0 refills | Status: DC

## 2019-12-28 MED ORDER — IOHEXOL 300 MG/ML  SOLN
100.0000 mL | Freq: Once | INTRAMUSCULAR | Status: AC | PRN
  Administered 2019-12-28: 100 mL via INTRAVENOUS

## 2019-12-30 ENCOUNTER — Encounter: Payer: Self-pay | Admitting: Internal Medicine

## 2019-12-30 ENCOUNTER — Other Ambulatory Visit: Payer: Self-pay

## 2019-12-30 ENCOUNTER — Inpatient Hospital Stay (HOSPITAL_BASED_OUTPATIENT_CLINIC_OR_DEPARTMENT_OTHER): Payer: Medicare Other | Admitting: Internal Medicine

## 2019-12-30 ENCOUNTER — Telehealth: Payer: Self-pay | Admitting: Medical Oncology

## 2019-12-30 VITALS — BP 157/82 | HR 65 | Temp 96.3°F | Resp 18 | Ht 67.0 in | Wt 153.0 lb

## 2019-12-30 DIAGNOSIS — I252 Old myocardial infarction: Secondary | ICD-10-CM | POA: Diagnosis not present

## 2019-12-30 DIAGNOSIS — K449 Diaphragmatic hernia without obstruction or gangrene: Secondary | ICD-10-CM | POA: Diagnosis not present

## 2019-12-30 DIAGNOSIS — Z79899 Other long term (current) drug therapy: Secondary | ICD-10-CM | POA: Diagnosis not present

## 2019-12-30 DIAGNOSIS — N400 Enlarged prostate without lower urinary tract symptoms: Secondary | ICD-10-CM | POA: Diagnosis not present

## 2019-12-30 DIAGNOSIS — K429 Umbilical hernia without obstruction or gangrene: Secondary | ICD-10-CM | POA: Diagnosis not present

## 2019-12-30 DIAGNOSIS — C8208 Follicular lymphoma grade I, lymph nodes of multiple sites: Secondary | ICD-10-CM | POA: Diagnosis not present

## 2019-12-30 DIAGNOSIS — E876 Hypokalemia: Secondary | ICD-10-CM | POA: Diagnosis not present

## 2019-12-30 DIAGNOSIS — J479 Bronchiectasis, uncomplicated: Secondary | ICD-10-CM | POA: Diagnosis not present

## 2019-12-30 DIAGNOSIS — I700 Atherosclerosis of aorta: Secondary | ICD-10-CM | POA: Diagnosis not present

## 2019-12-30 NOTE — Telephone Encounter (Signed)
LVM to pick up K+ supplement.

## 2019-12-30 NOTE — Progress Notes (Signed)
[place] [phone]   [phone]  OFFICE PROGRESS NOTE  DIAGNOSIS: Bulky stage IV low-grade lymphoma diagnosed in November 2009.  PRIOR THERAPY: 1. Status post 7 cycles of systemic chemotherapy with CHOP/Rituxan. Last dose was given May 2010. 2. Status post 6 cycles of maintenance Rituxan 375 mg/sq m given every 2 months. Last dose was given on May 29, 2010, and the patient was lost to followup at that time. He received another dose on 12/11/2010, then again missed 2 doses. 3. systemic chemotherapy with Rituxan 375 mg/M2 on day 1 and that bendamustine 90 mg/M2 on days 1 and 2 every 4 weeks, status post 6 cycles. 4. Maintenance Rituxan 375 mg/M2 every 2 months, status post 12 cycles  5. Rituxan 375 MG/M2 given weekly for 4 weeks and this is followed by maintenance treatment every 2 months, status post 22 cycles discontinued secondary to disease progression. 6. Systemic chemotherapy with bendamustine 90 mg/M2 on days 1 and 2 and Rituxan 375 mg/M2 on day 1 every 4 weeks.  First dose March 03, 2018.  Status post 4 cycles.  CURRENT THERAPY: Observation.  INTERVAL HISTORY: Eric [name] 79 y.o. male returns to the clinic today for 6 months follow-up visit.  The patient is feeling fine today with no concerning complaints.  He denied having any chest pain, shortness of breath, cough or hemoptysis.  He denied having any fever or chills.  She has no nausea, vomiting, diarrhea or constipation.  He denied having any headache or visual changes.  He had repeat CT scan of the chest, abdomen pelvis performed recently and the patient is here today for evaluation and discussion of the scan results.  MEDICAL HISTORY: Past Medical History:  Diagnosis Date  . Cancer ([place])   . Coronary atherosclerosis of native coronary artery   . Encounter for antineoplastic chemotherapy 06/20/2015  . GERD (gastroesophageal reflux disease)   . History of heart attack   .  Hyperlipidemia   . Myocardial infarction ([place])   . Non Hodgkin's lymphoma ([place])     ALLERGIES:  is allergic to neosporin [neomycin-bacitracin zn-polymyx].  MEDICATIONS:  Current Outpatient Medications  Medication Sig Dispense Refill  . aspirin EC 81 MG tablet Take 81 mg by mouth at bedtime.    Marland Kitchen atorvastatin (LIPITOR) 40 MG tablet TAKE 1 TABLET BY MOUTH DAILY AT 6 PM 90 tablet 3  . diphenhydrAMINE (BENADRYL) 25 MG tablet Take 25 mg by mouth at bedtime as needed for sleep.     Marland Kitchen losartan (COZAAR) 50 MG tablet Take 1 tablet (50 mg total) by mouth daily. 90 tablet 3  . omeprazole (PRILOSEC) 20 MG capsule Take 20 mg by mouth daily as needed (acid relfux).    . Polyethylene Glycol 3350 (PEG 3350) POWD Take by mouth.    . potassium chloride SA (KLOR-CON) 20 MEQ tablet Take 1 tablet (20 mEq total) by mouth 2 (two) times daily. 7 tablet 0  . prochlorperazine (COMPAZINE) 10 MG tablet Take 1 tablet (10 mg total) by mouth every 6 (six) hours as needed for nausea or vomiting. (Patient not taking: Reported on 10/15/2019) 30 tablet 0  . sildenafil (VIAGRA) 100 MG tablet Take 100 mg by mouth daily as needed.  3  . traZODone (DESYREL) 100 MG tablet Take 1 tablet (100 mg total) by mouth at bedtime as needed for sleep. 90 tablet 1   No current facility-administered medications for this visit.    SURGICAL HISTORY:  Past Surgical History:  Procedure Laterality Date  . CORONARY STENT PLACEMENT    . LEFT HEART CATHETERIZATION WITH CORONARY ANGIOGRAM N/A 03/03/2012   Procedure: LEFT HEART CATHETERIZATION WITH CORONARY ANGIOGRAM;  Surgeon: [name], MD;  Location: [facility] C[facility] LAB;  Service: Cardiovascular;  Laterality: N/A;  . PERCUTANEOUS CORONARY STENT INTERVENTION (PCI-S) N/A 03/04/2012   Procedure: PERCUTANEOUS CORONARY STENT INTERVENTION (PCI-S);  Surgeon: [name], MD;  Location: [facility] CATH LAB;  Service: Cardiovascular;  Laterality: N/A;    REVIEW OF SYSTEMS:  A comprehensive review of systems  was negative.   PHYSICAL EXAMINATION: General appearance: alert, cooperative and no distress Head: Normocephalic, without obvious abnormality, atraumatic Neck: no adenopathy, no JVD, supple, symmetrical, trachea midline and thyroid not enlarged, symmetric, no tenderness/mass/nodules Lymph nodes: Cervical, supraclavicular, and axillary nodes normal. Resp: clear to auscultation bilaterally Back: symmetric, no curvature. ROM normal. No CVA tenderness. Cardio: regular rate and rhythm, S1, S2 normal, no murmur, click, rub or gallop GI: soft, non-tender; bowel sounds normal; no masses,  no organomegaly Extremities: extremities normal, atraumatic, no cyanosis or edema  ECOG PERFORMANCE STATUS: 0 - Asymptomatic  Blood pressure (!) 157/82, pulse 65, temperature (!) 96.3 F (35.7 C), temperature source Tympanic, resp. rate 18, height 5\' 7"  (1.702 m), weight 153 lb (69.4 kg), SpO2 100 %.  LABORATORY DATA: Lab Results  Component Value Date   WBC 4.2 12/28/2019   HGB 12.8 (L) 12/28/2019   HCT 39.6 12/28/2019   MCV 85.5 12/28/2019   PLT 263 12/28/2019      Chemistry      Component Value Date/Time   NA 142 12/28/2019 0951   NA 144 02/16/2019 1000   NA 140 02/14/2017 0804   K 3.1 (L) 12/28/2019 0951   K 4.4 02/14/2017 0804   CL 105 12/28/2019 0951   CL 106 08/06/2012 1244   CO2 30 12/28/2019 0951   CO2 22 02/14/2017 0804   BUN 11 12/28/2019 0951   BUN 15 02/16/2019 1000   BUN 14.8 02/14/2017 0804   CREATININE 1.02 12/28/2019 0951   CREATININE 1.1 02/14/2017 0804      Component Value Date/Time   CALCIUM 9.5 12/28/2019 0951   CALCIUM 9.2 02/14/2017 0804   ALKPHOS 132 (H) 12/28/2019 0951   ALKPHOS 138 02/14/2017 0804   AST 28 12/28/2019 0951   AST 29 02/14/2017 0804   ALT 20 12/28/2019 0951   ALT 27 02/14/2017 0804   BILITOT 0.6 12/28/2019 0951   BILITOT 0.61 02/14/2017 0804       RADIOGRAPHIC STUDIES: CT Chest W Contrast  Result Date: 12/28/2019 CLINICAL DATA:   Non-Hodgkin lymphoma. EXAM: CT CHEST, ABDOMEN, AND PELVIS WITH CONTRAST TECHNIQUE: Multidetector CT imaging of the chest, abdomen and pelvis was performed following the standard protocol during bolus administration of intravenous contrast. CONTRAST:  128mL OMNIPAQUE IOHEXOL 300 MG/ML  SOLN COMPARISON:  12/26/2018. FINDINGS: CT CHEST FINDINGS Cardiovascular: Atherosclerotic calcification of the aorta and coronary arteries. Heart size normal. No pericardial effusion. Mediastinum/Nodes: No pathologically enlarged mediastinal, hilar or axillary lymph nodes. Surgical clips in the right axilla. Esophagus is unremarkable. Moderate hiatal hernia. Lungs/Pleura: Minimal biapical pleuroparenchymal scarring. Mild central bronchiectasis. 2 mm left upper lobe nodule (6/46), unchanged from 12/26/2018 and therefore likely benign. Lungs are otherwise clear. No pleural fluid. Airway is unremarkable. Musculoskeletal: Degenerative changes in the spine. T12 compression deformity, unchanged. Chronically stable small expansile lesion in the anterolateral right seventh rib (2/49). No worrisome lytic or sclerotic lesions. CT ABDOMEN PELVIS FINDINGS Hepatobiliary: Liver and gallbladder are unremarkable.  No biliary ductal dilatation. Pancreas: Negative. Spleen: Negative. Adrenals/Urinary Tract: Adrenal glands are unremarkable. Low-attenuation lesions in the kidneys measure up to 5.5 cm on the right and are indicative of cysts. Ureters are decompressed. Bladder is indented by the prostate. Stomach/Bowel: Moderate hiatal hernia. Stomach, small bowel and appendix are otherwise unremarkable. Stool is seen in the majority of the colon, indicative of constipation. Vascular/Lymphatic: Atherosclerotic calcification of the aorta without aneurysm. Scattered subcentimeter retroperitoneal and mesenteric lymph nodes, as before. Reproductive: Prostate is heterogeneous and markedly enlarged. Other: Small umbilical hernia contains fat. No free fluid.  Mesenteries and peritoneum are unremarkable. Musculoskeletal: No worrisome lytic or sclerotic lesions. Mild degenerative changes in the spine. IMPRESSION: 1. Scattered small mesenteric and retroperitoneal lymph nodes, stable. No evidence of recurrent lymphoma. 2. Moderate hiatal hernia. 3. Heterogeneous and markedly enlarged prostate. 4. Aortic atherosclerosis (ICD10-I70.0). Coronary artery calcification. Electronically Signed   By: Lorin Picket M.D.   On: 12/28/2019 14:51   CT Abdomen Pelvis W Contrast  Result Date: 12/28/2019 CLINICAL DATA:  Non-Hodgkin lymphoma. EXAM: CT CHEST, ABDOMEN, AND PELVIS WITH CONTRAST TECHNIQUE: Multidetector CT imaging of the chest, abdomen and pelvis was performed following the standard protocol during bolus administration of intravenous contrast. CONTRAST:  150mL OMNIPAQUE IOHEXOL 300 MG/ML  SOLN COMPARISON:  12/26/2018. FINDINGS: CT CHEST FINDINGS Cardiovascular: Atherosclerotic calcification of the aorta and coronary arteries. Heart size normal. No pericardial effusion. Mediastinum/Nodes: No pathologically enlarged mediastinal, hilar or axillary lymph nodes. Surgical clips in the right axilla. Esophagus is unremarkable. Moderate hiatal hernia. Lungs/Pleura: Minimal biapical pleuroparenchymal scarring. Mild central bronchiectasis. 2 mm left upper lobe nodule (6/46), unchanged from 12/26/2018 and therefore likely benign. Lungs are otherwise clear. No pleural fluid. Airway is unremarkable. Musculoskeletal: Degenerative changes in the spine. T12 compression deformity, unchanged. Chronically stable small expansile lesion in the anterolateral right seventh rib (2/49). No worrisome lytic or sclerotic lesions. CT ABDOMEN PELVIS FINDINGS Hepatobiliary: Liver and gallbladder are unremarkable. No biliary ductal dilatation. Pancreas: Negative. Spleen: Negative. Adrenals/Urinary Tract: Adrenal glands are unremarkable. Low-attenuation lesions in the kidneys measure up to 5.5 cm on the  right and are indicative of cysts. Ureters are decompressed. Bladder is indented by the prostate. Stomach/Bowel: Moderate hiatal hernia. Stomach, small bowel and appendix are otherwise unremarkable. Stool is seen in the majority of the colon, indicative of constipation. Vascular/Lymphatic: Atherosclerotic calcification of the aorta without aneurysm. Scattered subcentimeter retroperitoneal and mesenteric lymph nodes, as before. Reproductive: Prostate is heterogeneous and markedly enlarged. Other: Small umbilical hernia contains fat. No free fluid. Mesenteries and peritoneum are unremarkable. Musculoskeletal: No worrisome lytic or sclerotic lesions. Mild degenerative changes in the spine. IMPRESSION: 1. Scattered small mesenteric and retroperitoneal lymph nodes, stable. No evidence of recurrent lymphoma. 2. Moderate hiatal hernia. 3. Heterogeneous and markedly enlarged prostate. 4. Aortic atherosclerosis (ICD10-I70.0). Coronary artery calcification. Electronically Signed   By: Lorin Picket M.D.   On: 12/28/2019 14:51    ASSESSMENT AND PLAN:  This is a very pleasant 79 years old African-American male with bulky stage IV follicular lymphoma initially treated with CHOP/Rituxan followed by maintenance Rituxan. The patient had disease recurrence and he was restarted again on treatment with Rituxan every 2 months status post 22 cycles. The patient has been tolerating this treatment well with no concerning adverse effects. The patient was found to have progression in December 2019.  He was started on systemic chemotherapy with bendamustine and Rituxan status post 4 cycles.  He tolerated the fourth cycle of his treatment well except for mild fatigue. He  is currently on observation and he is feeling fine with no concerning complaints. He had repeat CT scan of the chest, abdomen and pelvis that showed no concerning findings for recurrent lymphoma. I recommended for the patient to continue on observation with repeat  blood work in 6 months. For the hypokalemia, I started the patient on potassium chloride 20 mEq p.o. daily for 7 days and he was advised to increase his potassium rich diet. The patient was advised to call immediately if he has any concerning symptoms in the interval. The patient voices understanding of current disease status and treatment options and is in agreement with the current care plan. All questions were answered. The patient knows to call the clinic with any problems, questions or concerns. We can certainly see the patient much sooner if necessary.   Disclaimer: This note was dictated with voice recognition software. Similar sounding words can inadvertently be transcribed and may not be corrected upon review.

## 2020-01-04 DIAGNOSIS — N401 Enlarged prostate with lower urinary tract symptoms: Secondary | ICD-10-CM | POA: Diagnosis not present

## 2020-01-04 DIAGNOSIS — N138 Other obstructive and reflux uropathy: Secondary | ICD-10-CM | POA: Diagnosis not present

## 2020-01-04 DIAGNOSIS — N5201 Erectile dysfunction due to arterial insufficiency: Secondary | ICD-10-CM | POA: Diagnosis not present

## 2020-01-04 DIAGNOSIS — R972 Elevated prostate specific antigen [PSA]: Secondary | ICD-10-CM | POA: Diagnosis not present

## 2020-01-06 ENCOUNTER — Telehealth: Payer: Self-pay | Admitting: Internal Medicine

## 2020-01-06 NOTE — Telephone Encounter (Signed)
Scheduled per los. Called and left msg. Mailed printout  °

## 2020-01-24 NOTE — Progress Notes (Signed)
Cardiology Office Note   Date:  01/26/2020   ID:  Aum Caggiano, DOB 1940-03-30, MRN [id number]  PCP:  Horald Pollen, MD  Cardiologist: Dr. Tamala Julian, MD   Chief Complaint  Patient presents with  . Follow-up    History of Present Illness: Eric [name] is a 79 y.o. male who presents for 1 year follow up, seen for Dr. Tamala Julian.   Eric [name] has a hx of CAD s/p MI with DES/PCI to the RCA in 2013, HTN, HLD and follicular lymphoma followed by Dr. Julien Nordmann.  He was last seen by Dr. Tamala Julian 01/26/2019 and was doing well from a CV standpoint.  BP was elevated at that time therefore losartan 50 mg daily was added to his regimen with plans for BP follow-up with APP in the BP clinic.  Today he presents for follow up of CAD and HTN. He states that he has been doing well with no angnal symptoms. He checks his BP closely at home and while it has improved, it is not yet at goal of 130/80. He has been tolerating his medications well with no issues. He denies anginal symptoms, no SOB, PND, LE edema, orthopnea or palpitations. He remains active without chest pain symptoms. He is asking about his cholesterol today. We will check that for him.   Past Medical History:  Diagnosis Date  . Cancer ([place])   . Coronary atherosclerosis of native coronary artery   . Encounter for antineoplastic chemotherapy 06/20/2015  . GERD (gastroesophageal reflux disease)   . History of heart attack   . Hyperlipidemia   . Myocardial infarction ([place])   . Non Hodgkin's lymphoma [facility])     Past Surgical History:  Procedure Laterality Date  . CORONARY STENT PLACEMENT    . LEFT HEART CATHETERIZATION WITH CORONARY ANGIOGRAM N/A 03/03/2012   Procedure: LEFT HEART CATHETERIZATION WITH CORONARY ANGIOGRAM;  Surgeon: [name], MD;  Location: [facility] CATH LAB;  Service: Cardiovascular;  Laterality: N/A;  . PERCUTANEOUS CORONARY STENT INTERVENTION (PCI-S) N/A 03/04/2012   Procedure: PERCUTANEOUS CORONARY STENT INTERVENTION  (PCI-S);  Surgeon: [name], MD;  Location: [facility] C[facility] LAB;  Service: Cardiovascular;  Laterality: N/A;     Current Outpatient Medications  Medication Sig Dispense Refill  . aspirin EC 81 MG tablet Take 81 mg by mouth at bedtime.    Marland Kitchen atorvastatin (LIPITOR) 40 MG tablet TAKE 1 TABLET BY MOUTH DAILY AT 6 PM 90 tablet 3  . diphenhydrAMINE (BENADRYL) 25 MG tablet Take 25 mg by mouth at bedtime as needed for sleep.     Marland Kitchen omeprazole (PRILOSEC) 20 MG capsule Take 20 mg by mouth daily as needed (acid relfux).    . Polyethylene Glycol 3350 (PEG 3350) POWD Take by mouth.    . sildenafil (VIAGRA) 100 MG tablet Take 100 mg by mouth daily as needed.  3  . traZODone (DESYREL) 100 MG tablet Take 1 tablet (100 mg total) by mouth at bedtime as needed for sleep. 90 tablet 1  . losartan (COZAAR) 100 MG tablet Take 1 tablet (100 mg total) by mouth daily. 90 tablet 3   No current facility-administered medications for this visit.    Allergies:   Neosporin [neomycin-bacitracin zn-polymyx]    Social History:  The patient  reports that he has never smoked. He has never used smokeless tobacco. He reports current alcohol use. He reports that he does not use drugs.   Family History:  The patient's family history includes Leukemia in his mother.  ROS:  Please see the history of present illness.   Otherwise, review of systems are positive for none.   All other systems are reviewed and negative.    PHYSICAL EXAM: VS:  BP (!) 148/86   Pulse (!) 58   Ht 5\' 7"  (1.702 m)   Wt 158 lb 3.2 oz (71.8 kg)   SpO2 100%   BMI 24.78 kg/m  , BMI Body mass index is 24.78 kg/m.   General: Well developed, well nourished, NAD Lungs:Clear to ausculation bilaterally. No wheezes, rales, or rhonchi. Breathing is unlabored. Cardiovascular: RRR with S1 S2. No murmurs Extremities: No edema. Radial  2+ bilaterally Neuro: Alert and oriented. No focal deficits. No facial asymmetry. MAE spontaneously. Psych: Responds to  questions appropriately with normal affect.    EKG 01/26/20 with SB with a rate at 58bpm, no ST changes.    Recent Labs: 12/28/2019: ALT 20; BUN 11; Creatinine 1.02; Hemoglobin 12.8; Platelet Count 263; Potassium 3.1; Sodium 142    Lipid Panel    Component Value Date/Time   CHOL 121 02/16/2019 1000   TRIG 49 02/16/2019 1000   HDL 45 02/16/2019 1000   CHOLHDL 2.7 02/16/2019 1000   CHOLHDL 3.0 01/05/2015 1440   VLDL 15 01/05/2015 1440   LDLCALC 65 02/16/2019 1000     Wt Readings from Last 3 Encounters:  01/26/20 158 lb 3.2 oz (71.8 kg)  12/30/19 153 lb (69.4 kg)  10/15/19 160 lb 3.2 oz (72.7 kg)     ASSESSMENT AND PLAN:  1.  CAD s/p PCI/DES to the RCA 2013: -Denies anginal symptoms -Continue current regimen with ASA, statin -No beta blocker given bradycardia   2.  HLD: -Last LDL, 65 on 02/16/2019 -Repeat today with LFTs  -Continue statin   3.  HTN: -BP elevated on last OV therefore losartan 50 mg daily was added to his regimen -BP today at 148/86 with reports that home BPs in the 494 systolic range -Increase losartan to 100mg  PO QD and follow labs today  -Will have virtual follow up in 1 week    Current medicines are reviewed at length with the patient today.  The patient does not have concerns regarding medicines.  The following changes have been made:  Increase losartan to 100mg  PO QD   Labs/ tests ordered today include: CMET, Lipid panel    Orders Placed This Encounter  Procedures  . Basic metabolic panel  . Lipid panel  . Hepatic function panel  . EKG 12-Lead   Disposition:   FU with myself in 1 week  Signed, Kathyrn Drown, NP  01/26/2020 10:41 AM    [address], [place], [place]  [address] Phone: [phone]; Fax: [phone]

## 2020-01-26 ENCOUNTER — Encounter: Payer: Self-pay | Admitting: Cardiology

## 2020-01-26 ENCOUNTER — Other Ambulatory Visit: Payer: Self-pay

## 2020-01-26 ENCOUNTER — Ambulatory Visit: Payer: Medicare Other | Admitting: Cardiology

## 2020-01-26 VITALS — BP 148/86 | HR 58 | Ht 67.0 in | Wt 158.2 lb

## 2020-01-26 DIAGNOSIS — Z79899 Other long term (current) drug therapy: Secondary | ICD-10-CM | POA: Diagnosis not present

## 2020-01-26 DIAGNOSIS — E782 Mixed hyperlipidemia: Secondary | ICD-10-CM | POA: Diagnosis not present

## 2020-01-26 DIAGNOSIS — I10 Essential (primary) hypertension: Secondary | ICD-10-CM | POA: Diagnosis not present

## 2020-01-26 DIAGNOSIS — I2510 Atherosclerotic heart disease of native coronary artery without angina pectoris: Secondary | ICD-10-CM

## 2020-01-26 DIAGNOSIS — E7800 Pure hypercholesterolemia, unspecified: Secondary | ICD-10-CM

## 2020-01-26 LAB — BASIC METABOLIC PANEL
BUN/Creatinine Ratio: 7 — ABNORMAL LOW (ref 10–24)
BUN: 8 mg/dL (ref 8–27)
CO2: 25 mmol/L (ref 20–29)
Calcium: 9.3 mg/dL (ref 8.6–10.2)
Chloride: 104 mmol/L (ref 96–106)
Creatinine, Ser: 1.14 mg/dL (ref 0.76–1.27)
GFR calc Af Amer: 71 mL/min/{1.73_m2} (ref >59)
GFR calc non Af Amer: 61 mL/min/{1.73_m2} (ref >59)
Glucose: 86 mg/dL (ref 65–99)
Potassium: 4.5 mmol/L (ref 3.5–5.2)
Sodium: 143 mmol/L (ref 134–144)

## 2020-01-26 LAB — LIPID PANEL
Chol/HDL Ratio: 2.6 ratio (ref 0.0–5.0)
Cholesterol, Total: 153 mg/dL (ref 100–199)
HDL: 59 mg/dL (ref >39)
LDL Chol Calc (NIH): 81 mg/dL (ref 0–99)
Triglycerides: 67 mg/dL (ref 0–149)
VLDL Cholesterol Cal: 13 mg/dL (ref 5–40)

## 2020-01-26 LAB — HEPATIC FUNCTION PANEL
ALT: 19 IU/L (ref 0–44)
AST: 20 IU/L (ref 0–40)
Albumin: 4.2 g/dL (ref 3.7–4.7)
Alkaline Phosphatase: 123 IU/L — ABNORMAL HIGH (ref 44–121)
Bilirubin Total: 0.3 mg/dL (ref 0.0–1.2)
Bilirubin, Direct: 0.12 mg/dL (ref 0.00–0.40)
Total Protein: 6.1 g/dL (ref 6.0–8.5)

## 2020-01-26 MED ORDER — ATORVASTATIN CALCIUM 40 MG PO TABS
ORAL_TABLET | ORAL | 3 refills | Status: DC
Start: 2020-01-26 — End: 2020-08-11

## 2020-01-26 MED ORDER — LOSARTAN POTASSIUM 100 MG PO TABS: 100.0000 mg | ORAL_TABLET | Freq: Every day | ORAL | 3 refills | Status: DC

## 2020-01-26 NOTE — Patient Instructions (Signed)
Medication Instructions:  Your physician has recommended you make the following change in your medication:   1. INCREASE LOSARTAN TO 100 MG DAILY.  Refills have been sent in to your pharmacy for your Cardiac medications.  *If you need a refill on your cardiac medications before your next appointment, please call your pharmacy*   Lab Work: TODAY: BMET, LIPIS, LFTS If you have labs (blood work) drawn today and your tests are completely normal, you will receive your results only by: Marland Kitchen MyChart Message (if you have MyChart) OR . A paper copy in the mail If you have any lab test that is abnormal or we need to change your treatment, we will call you to review the results.   Testing/Procedures: NONE   Follow-Up: At [facility], you and your health needs are our priority.  As part of our continuing mission to provide you with exceptional heart care, we have created designated Provider Care Teams.  These Care Teams include your primary Cardiologist (physician) and Advanced Practice Providers (APPs -  Physician Assistants and Nurse Practitioners) who all work together to provide you with the care you need, when you need it.  We recommend signing up for the patient portal called "MyChart".  Sign up information is provided on this After Visit Summary.  MyChart is used to connect with patients for Virtual Visits (Telemedicine).  Patients are able to view lab/test results, encounter notes, upcoming appointments, etc.  Non-urgent messages can be sent to your provider as well.   To learn more about what you can do with MyChart, go to [email or web address].    Your next appointment:   02/02/20 AT 10:15 AM   The format for your next appointment:   Virtual Visit   Provider:   Kathyrn Drown, NP

## 2020-01-31 NOTE — Progress Notes (Deleted)
{Choose 1 Note Type (Video or Telephone):[phone]}    Date:  01/31/2020   ID:  Eric [name], DOB [birth year], MRN [id number] The patient was identified using 2 identifiers.  {Patient Location:[id number]::"Home"} {Provider Location:[phone]::"Home Office"}  PCP:  Horald Pollen, MD  Cardiologist:  [name], MD *** Electrophysiologist:  None   Evaluation Performed:  {Choose Visit XBMW:[id number]::"VOZDGU-YQ Visit"}  Chief Complaint:  ***  History of Present Illness:    Eric [name] is a 79 y.o. male with a hx of CAD s/p MI with DES/PCI to the RCA in 2013, HTN, HLD and follicular lymphoma followed by Dr. Julien Nordmann.  He was last seen by Dr. Tamala Julian 01/26/2019 and was doing well from a CV standpoint.  BP was elevated at that time therefore losartan 50 mg daily was added to his regimen with plans for BP follow-up with APP in the BP clinic.  Today he presents for follow up of CAD and HTN. He states that he has been doing well with no angnal symptoms. He checks his BP closely at home and while it has improved, it is not yet at goal of 130/80. He has been tolerating his medications well with no issues. He denies anginal symptoms, no SOB, PND, LE edema, orthopnea or palpitations. He remains active without chest pain symptoms. He is asking about his cholesterol today. We will check that for him.      1.  CAD s/p PCI/DES to the RCA 2013: -Denies anginal symptoms -Continue current regimen with ASA, statin -No beta blocker given bradycardia   2.  HLD: -Last LDL, 65 on 02/16/2019 -Repeat today with LFTs  -Continue statin   3.  HTN: -BP elevated on last OV therefore losartan 50 mg daily was added to his regimen -BP today at 148/86 with reports that home BPs in the 034 systolic range -Increase losartan to 100mg  PO QD and follow labs today  -Will have virtual follow up in 1 week     The patient {does/does not:200015} have symptoms concerning for COVID-19  infection (fever, chills, cough, or new shortness of breath).    Past Medical History:  Diagnosis Date  . Cancer ([place])   . Coronary atherosclerosis of native coronary artery   . Encounter for antineoplastic chemotherapy 06/20/2015  . GERD (gastroesophageal reflux disease)   . History of heart attack   . Hyperlipidemia   . Myocardial infarction ([place])   . Non Hodgkin's lymphoma [facility])    Past Surgical History:  Procedure Laterality Date  . CORONARY STENT PLACEMENT    . LEFT HEART CATHETERIZATION WITH CORONARY ANGIOGRAM N/A 03/03/2012   Procedure: LEFT HEART CATHETERIZATION WITH CORONARY ANGIOGRAM;  Surgeon: [name], MD;  Location: [facility] CATH LAB;  Service: Cardiovascular;  Laterality: N/A;  . PERCUTANEOUS CORONARY STENT INTERVENTION (PCI-S) N/A 03/04/2012   Procedure: PERCUTANEOUS CORONARY STENT INTERVENTION (PCI-S);  Surgeon: [name], MD;  Location: [facility] CATH LAB;  Service: Cardiovascular;  Laterality: N/A;     No outpatient medications have been marked as taking for the 02/02/20 encounter (Appointment) with Tommie Raymond, NP.     Allergies:   Neosporin [neomycin-bacitracin zn-polymyx]   Social History   Tobacco Use  . Smoking status: Never Smoker  . Smokeless tobacco: Never Used  Vaping Use  . Vaping Use: Never used  Substance Use Topics  . Alcohol use: Yes    Comment: occasionally  . Drug use: No     Family Hx: The patient's family history includes Leukemia in his mother.  ROS:   Please see the history of present illness.    *** All other systems reviewed and are negative.   Prior CV studies:   The following studies were reviewed today:  ***  Labs/Other Tests and Data Reviewed:    EKG:  {EKG/Telemetry Strips Reviewed:[phone]}  Recent Labs: 12/28/2019: Hemoglobin 12.8; Platelet Count 263 01/26/2020: ALT 19; BUN 8; Creatinine, Ser 1.14; Potassium 4.5; Sodium 143   Recent Lipid Panel Lab Results  Component Value Date/Time   CHOL 153  01/26/2020 10:39 AM   TRIG 67 01/26/2020 10:39 AM   HDL 59 01/26/2020 10:39 AM   CHOLHDL 2.6 01/26/2020 10:39 AM   CHOLHDL 3.0 01/05/2015 02:40 PM   LDLCALC 81 01/26/2020 10:39 AM    Wt Readings from Last 3 Encounters:  01/26/20 158 lb 3.2 oz (71.8 kg)  12/30/19 153 lb (69.4 kg)  10/15/19 160 lb 3.2 oz (72.7 kg)     Risk Assessment/Calculations:   {Does this patient have ATRIAL FIBRILLATION?:[id number]}  Objective:    Vital Signs:  There were no vitals taken for this visit.   {[facility] Virtual Exam (Optional):[phone]::"VITAL SIGNS:  reviewed"}  ASSESSMENT & PLAN:    1. ***   Shared Decision Making/Informed Consent   {Are you ordering a CV Procedure (e.g. stress test, cath, DCCV, TEE, etc)?   Press F2        :[id number]}    COVID-19 Education: The signs and symptoms of COVID-19 were discussed with the patient and how to seek care for testing (follow up with PCP or arrange E-visit).  ***The importance of social distancing was discussed today.  Time:   Today, I have spent *** minutes with the patient with telehealth technology discussing the above problems.     Medication Adjustments/Labs and Tests Ordered: Current medicines are reviewed at length with the patient today.  Concerns regarding medicines are outlined above.   Tests Ordered: No orders of the defined types were placed in this encounter.   Medication Changes: No orders of the defined types were placed in this encounter.   Follow Up:  {F/U Format:[phone]} {follow up:15908}  Signed, Kathyrn Drown, NP  01/31/2020 6:41 AM    [place]

## 2020-02-01 ENCOUNTER — Encounter: Payer: Self-pay | Admitting: Physician Assistant

## 2020-02-01 NOTE — Progress Notes (Signed)
Virtual Visit via Telephone Note   This visit type was conducted due to national recommendations for restrictions regarding the COVID-19 Pandemic (e.g. social distancing) in an effort to limit this patient's exposure and mitigate transmission in our community.  Due to his co-morbid illnesses, this patient is at least at moderate risk for complications without adequate follow up.  This format is felt to be most appropriate for this patient at this time.  The patient did not have access to video technology/had technical difficulties with video requiring transitioning to audio format only (telephone).  All issues noted in this document were discussed and addressed.  No physical exam could be performed with this format.  Please refer to the patient's chart for his  consent to telehealth for [facility].   The patient was identified using 2 identifiers.  Date:  02/02/2020   ID:  [name], DOB 07-25-1940, MRN [id number]  Patient Location: Home Provider Location: Office/Clinic  PCP:  Horald Pollen, MD  Cardiologist:  [name], MD  Electrophysiologist:  None   Evaluation Performed:  Follow-Up Visit  Chief Complaint:  F/u BP  History of Present Illness:    Bruno Leach is a 79 y.o. male with CAD s/p MI with DES/PCI to the RCA in 2013, ischemic cardiomyopathy with EF 45-50% at that time, HTN, HLD, GERD, and follicular lymphoma who presents for follow-up of blood pressure. He had remote cath in 2013 as outlined below with residual disease managed medically. Chart also indicates he was a Plavix hyporesponder and required Effient at that time. Last assessment was with ETT 2016 which was low risk. He was recently seen in the office by Kathyrn Drown 01/26/20 and was clinically doing well from cardiac standpoint except BP was elevated. Losartan was increased to 157m daily. He is not on BB due to baseline sinus bradycardia.  He is seen back for follow-up virtually doing well.  He tolerated the losartan increase fine, just did so about 5 days ago. He is seeing an average reading of 130/70 with the lowest reading being 1321systolic. He does see occasional outliers in the 140s but these tend to come down after rechecking. He denies any CP, SOB, syncope, palpitations or dizziness. The pandemic affected his exercise habits which he feels has contributed to his elevated blood pressure. We also reviewed his labs from 01/26/20 which showed slight uptrend in LDL as well. He plans to address these through lifestyle modification. He also plans to watch his diet more closely.  Labs Independently Reviewed 01/26/20 LFTs wnl except Alk phos 123, LDL 81, K 4.5, Cr 1.14 12/2019 Hgb 12.8, K 3.1, Cr 1.02, alk phos 132    Past Medical History:  Diagnosis Date  . [place]   . Coronary atherosclerosis of native coronary artery    a. s/p MI with DES/PCI to the RCA in 2013.  . Encounter for antineoplastic chemotherapy 06/20/2015  . Essential hypertension   . GERD (gastroesophageal reflux disease)   . History of heart attack   . Hyperlipidemia   . Ischemic cardiomyopathy   . [place]   . [place]'s [place]   . Plavix resistance    Plavix hyporesponder 2013  . Sinus bradycardia    baseline HR 50s at times   Past Surgical History:  Procedure Laterality Date  . CORONARY STENT PLACEMENT    . LEFT HEART CATHETERIZATION WITH CORONARY ANGIOGRAM N/A 03/03/2012   Procedure: LEFT HEART CATHETERIZATION WITH CORONARY ANGIOGRAM;  Surgeon:  [name], MD;  Location[facility] C[facility] LAB;  Service: Cardiovascular;  Laterality: N/A;  . PERCUTANEOUS CORONARY STENT INTERVENTION (PCI-S) N/A 03/04/2012   Procedure: PERCUTANEOUS CORONARY STENT INTERVENTION (PCI-S);  Surgeon: [name], MD;  Location: [facility] CATH LAB;  Service: Cardiovascular;  Laterality: N/A;     Current Meds  Medication Sig  . aspirin EC 81 MG tablet Take 81 mg by mouth at bedtime.  Marland Kitchen atorvastatin  (LIPITOR) 40 MG tablet TAKE 1 TABLET BY MOUTH DAILY AT 6 PM  . diphenhydrAMINE (BENADRYL) 25 MG tablet Take 25 mg by mouth at bedtime as needed for sleep.   Marland Kitchen losartan (COZAAR) 100 MG tablet Take 1 tablet (100 mg total) by mouth daily.  Marland Kitchen omeprazole (PRILOSEC) 20 MG capsule Take 20 mg by mouth daily as needed (acid relfux).  . Polyethylene Glycol 3350 (PEG 3350) POWD Take by mouth.  . sildenafil (VIAGRA) 100 MG tablet Take 100 mg by mouth daily as needed.  . traZODone (DESYREL) 100 MG tablet Take 1 tablet (100 mg total) by mouth at bedtime as needed for sleep.     Allergies:   Neosporin [neomycin-bacitracin zn-polymyx]   Social History   Tobacco Use  . Smoking status: Never Smoker  . Smokeless tobacco: Never Used  Vaping Use  . Vaping Use: Never used  Substance Use Topics  . Alcohol use: Yes    Comment: occasionally  . Drug use: No     Family Hx: The patient's family history includes Leukemia in his mother.  ROS:   Please see the history of present illness.   No significant weight changes recently per pt. All other systems reviewed and are negative.   Prior CV studies:   The following studies were reviewed today:  ETT 08/2014 (scan) Low risk study  02/2012 Cath 1 CARDIAC CATHETERIZATION  PROCEDURE:  Left heart catheterization with selective coronary angiography, left ventriculogram via the radial artery approach.  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 Prior pain developed at 3 AM while watching a movie, left-sided radiated to his left arm and into his jaw. Lasted approximately one hour.  The risks, benefits, and details of the procedure were explained to the patient, including possibilities of stroke, heart attack, death, renal impairment, arterial damage, bleeding.  The patient verbalized understanding and wanted to proceed.   Informed written consent was obtained.  PROCEDURE TECHNIQUE:  Allen's test was performed pre-and post procedure and was normal. The right radial artery site was prepped and draped in a sterile fashion. One percent lidocaine was used for local anesthesia. Using the modified Seldinger technique a 5 French hydrophilic sheath was inserted into the radial artery without difficulty. 3 mg of verapamil was administered via the sheath. A Judkins right #4 catheter with the guidance of a Versicore wire was placed in the right coronary cusp and selectively cannulated the right coronary artery. After traversing the aortic arch, 3500 units of heparin IV was administered. A Judkins left #3.5 catheter was used to selectively cannulate the left main artery. Multiple views with hand injection of Omnipaque were obtained. Catheter a pigtail catheter was used to cross into the left ventricle, hemodynamics were obtained, and a left ventriculogram was performed in the RAO position with power injection. Following the procedure, sheath was removed, patient was hemodynamically stable, hemostasis was maintained with a Terumo T band.   CONTRAST:  Total of 80 ml.    FLOUROSCOPY TIME:  3.0 min.  COMPLICATIONS:  None.    HEMODYNAMICS:  Aortic pressure was 83/15VVOH; LV systolic pressure was 60VPXT; LVEDP 20mHg.  There was no gradient between the left ventricle and aorta.    ANGIOGRAPHIC DATA:    Left main: Branches into LAD, ramus, and circumflex artery. Distal left main tapers and is heavily calcified; this is best seen in the  LAO caudal projection.   Left anterior descending (LAD):  this proximal vessel is heavily calcified. At the branch point of a large first diagonal there is eccentric plaque that in steep LAO caudal projection appears to be hemodynamically significant. In other views however does not appear to be flow-limiting. The large diagonal branch however in its ostial/proximal portion does appear to be  hemodynamically significant with stenosis of up to 95%.  The mid to distal LAD demonstrates 50% stenosis.   Circumflex artery (CIRC):  there are 3 significant obtuse marginal branches (ramus included). In the proximal section of the AV groove circumflex there appears to be a nub where previous branch may have been present. Minor luminal irregularities throughout this vessel.   Right coronary artery (RCA): this vessel is occluded proximally with thrombus in place, minimal flow distal with left to right collateral blood flow predominately from septal branch of LAD.   LEFT VENTRICULOGRAM:  Left ventricular angiogram was done in the 30 RAO projection and revealed base to mid inferior wall akinesis with an estimated ejection fraction of  45-50%.   IMPRESSIONS:  1.  Occluded proximal RCA with left to right collaterals-culprit lesion. 95% first diagonal branch stenosis. Possibly hemodynamically significant LAD, proximal stenosis at the bifurcation of this first diagonal branch, eccentric calcified plaque. Calcified, tapered left main distally.  2.  Mildly reduced left ventricular systolic function.  LVEDP 15 mmHg.  Ejection fraction 45-50%.  RECOMMENDATION:  Findings have been discussed with patient, Dr. STamala Julian Dr. STamala Julianwill review films and determine possible further evaluation of LAD, possible FFR. We will resume heparin in 8 hours. Radial sheath pulled. Patient was symptom-free.   02/2012 Cath 2 WSiris Hoosis a 79y.o. male  INDICATION: Recent inferior wall non-ST elevation myocardial infarction. Subtotally occluded proximal to mid right coronary with an adequate inferior wall collaterals have resulted in waxing and waning pain over the past 48 hours. Occurring this morning was relieved with initiation of an IV nitroglycerin drip.   PROCEDURE: Plasty and stenting of the 99% proximal to mid RCA   CONSENT: The risks, benefits, and details of the procedure were explained to the  patient. Risks including death, MI, stroke, bleeding, limb ischemia, renal failure and allergy were described and accepted by the patient.  Informed written consent was obtained prior to proceeding.  PROCEDURE TECHNIQUE:  After Xylocaine anesthesia a 6 French sheath was placed in the right femoral artery with a single anterior needle wall stick.   Coronary guiding shots were made using a 6 [place] 4 guide catheter. Antithrombotic therapy, Bivalirudin bolus and infusion, was begun and determined to be therapeutic by ACT. Antiplatelet therapy, 600 mg, was loaded 18 hours ago.  A poor wire was advanced through the stenosis in the right coronary and predilatation was performed with a 2.0 x 15 mm balloon we then made several passes with thrombectomy catheter. After visualizing the artery following predilatation and aspiration we felt that the entire mid segment needed to be treated. We will unable to pass a 38 mm long Promus Premier into proper position. We then removed this catheter and did  more aggressive dilatation using a 3.0 x 20 mm in the Empira balloon throughout the treatment segment. Then attempted to position the 38 mm long stent but again it would not travel down the vessel. We then placed a Prowater buddy wire despite having the buddy wire in place the stent would not track. Aggressive guide positioning was employed. This then caused a proximal vessel dissection. After repositioning the guide catheter, and performing additional dilatation distally we will able to advance and deployed a 38 x 3.0 mm Promus Premier drug-eluting stent. Deployed at 12 atmospheres. Because of the proximal dissection we then positioned and deployed a 3.0 x 28 mm long Promus premier from the midsegment overlapping with the prior stent to near the ostium of the right coronary. The stent was deployed to 16 atmospheres post dilatation was then performed with a 20 mm long by 3.25 mm Empira Manson balloon to 16 atmospheres throughout  the treated segment. TIMI grade 3 flow was noted. No evidence of perforation was noted. The dissection was completely covered. Patient experienced no complications. He did have left blood pressure throughout the procedure he eventually requiring a dopamine infusion.  CONTRAST:  Total of 200  cc.  COMPLICATIONS:  Proximal catheter induced dissection.    ANGIOGRAPHIC RESULTS:   The 99% segmental proximal to mid right coronary stenosis was reduced to 0% with TIMI grade 3 flow using overlapping 38 and 28 mm x 3.0 mm diameter Promus Premier drug-eluting stents.   IMPRESSIONS:  1. Successful but complicated PCI with reduction in the near total occlusion of the right coronary to 0% with TIMI grade 3 flow following overlap stenting of the distal to proximal right coronary using a total of 66 mm of drug-eluting stent.   RECOMMENDATION:  Prolonged dual antiplatelet therapy for greater than 12 months.Marland Kitchen    [name], MD 03/04/2012 2:56 PM     Labs/Other Tests and Data Reviewed:    EKG:  An ECG dated 01/26/20 was personally reviewed today and demonstrated:  SB 58bpm, no acute STT changes  Recent Labs: 12/28/2019: Hemoglobin 12.8; Platelet Count 263 01/26/2020: ALT 19; BUN 8; Creatinine, Ser 1.14; Potassium 4.5; Sodium 143   Recent Lipid Panel Lab Results  Component Value Date/Time   CHOL 153 01/26/2020 10:39 AM   TRIG 67 01/26/2020 10:39 AM   HDL 59 01/26/2020 10:39 AM   CHOLHDL 2.6 01/26/2020 10:39 AM   CHOLHDL 3.0 01/05/2015 02:40 PM   LDLCALC 81 01/26/2020 10:39 AM    Wt Readings from Last 3 Encounters:  02/02/20 158 lb 3.2 oz (71.8 kg)  01/26/20 158 lb 3.2 oz (71.8 kg)  12/30/19 153 lb (69.4 kg)     Objective:    Vital Signs:  BP 139/72   Pulse 75   Ht '5\' 7"'  (1.702 m)   Wt 158 lb 3.2 oz (71.8 kg)   BMI 24.78 kg/m    VS reviewed. General - calm M in no acute distress Pulm - No labored breathing, no coughing during visit, no audible wheezing, speaking in  full sentences Neuro - A+Ox3, no slurred speech, answers questions appropriately Psych - Pleasant affect  ASSESSMENT & PLAN:    1. Essential HTN - average home BP 130/70 per patient report, with lowest reading being 709 systolic, highest in the 643C. He is tolerating increased losartan dose and just increased it a few days prior. We'll have him continue to monitor his blood pressure at home with instructions to notify our office if he is tending  to see readings over 173 systolic or 80 diastolic at which time he may require an additional agent. However, he also plans to try and address his blood pressure through lifestyle modification as well. Will obtain a followup BMET on the higher dose of losartan. Since elevated blood pressure was a fairly new phenomena for him last year, will also obtain baseline TSH at that time. 2. CAD - doing well clinically without anginal symptoms. Continue ASA, statin. Not on BB due to resting asymptomatic sinus bradycardia. 3. Ischemic cardiomyopathy - clinically has done well since this was seen in 2013. He has not had repeat testing to re-evaluate his LV function. In the absence of any clinical symptoms, will follow conservatively and defer timing of repeat assessment to his primary cardiologist. 4. Hyperlipidemia goal LDL <70 - LDL was 81 by labs last week. We discussed his findings. He was previously at goal when more active physically. He plans to trial lifestyle modifications first before titrating atorvastatin further. We will plan for him to return in 3-4 months for a fasting lipid profile and CMET.   Time:   Today, I have spent 12 minutes with the patient with telehealth technology discussing the above problems.     Medication Adjustments/Labs and Tests Ordered: Current medicines are reviewed at length with the patient today.  Testing and concerns regarding medicines are outlined above.    Follow Up:  Dr. Tamala Julian in 12 months  Signed, Charlie Pitter, PA-C    02/02/2020 11:50 AM    [place]

## 2020-02-02 ENCOUNTER — Other Ambulatory Visit: Payer: Self-pay

## 2020-02-02 ENCOUNTER — Telehealth (INDEPENDENT_AMBULATORY_CARE_PROVIDER_SITE_OTHER): Payer: Medicare Other | Admitting: Physician Assistant

## 2020-02-02 ENCOUNTER — Telehealth: Payer: Self-pay | Admitting: *Deleted

## 2020-02-02 ENCOUNTER — Encounter: Payer: Self-pay | Admitting: Physician Assistant

## 2020-02-02 VITALS — BP 139/72 | HR 75 | Ht 67.0 in | Wt 158.2 lb

## 2020-02-02 DIAGNOSIS — I10 Essential (primary) hypertension: Secondary | ICD-10-CM | POA: Diagnosis not present

## 2020-02-02 DIAGNOSIS — I255 Ischemic cardiomyopathy: Secondary | ICD-10-CM

## 2020-02-02 DIAGNOSIS — E785 Hyperlipidemia, unspecified: Secondary | ICD-10-CM

## 2020-02-02 DIAGNOSIS — I2510 Atherosclerotic heart disease of native coronary artery without angina pectoris: Secondary | ICD-10-CM | POA: Diagnosis not present

## 2020-02-02 NOTE — Telephone Encounter (Signed)
°  Patient Consent for Virtual Visit         Eric [name] has provided verbal consent on 02/02/2020 for a virtual visit (video or telephone).   CONSENT FOR VIRTUAL VISIT FOR:  Eric [name]  By participating in this virtual visit I agree to the following:  I hereby voluntarily request, consent and authorize [facility] and its employed or contracted physicians, physician assistants, nurse practitioners or other licensed health care professionals (the Practitioner), to provide me with telemedicine health care services (the Services") as deemed necessary by the treating Practitioner. I acknowledge and consent to receive the Services by the Practitioner via telemedicine. I understand that the telemedicine visit will involve communicating with the Practitioner through live audiovisual communication technology and the disclosure of certain medical information by electronic transmission. I acknowledge that I have been given the opportunity to request an in-person assessment or other available alternative prior to the telemedicine visit and am voluntarily participating in the telemedicine visit.  I understand that I have the right to withhold or withdraw my consent to the use of telemedicine in the course of my care at any time, without affecting my right to future care or treatment, and that the Practitioner or I may terminate the telemedicine visit at any time. I understand that I have the right to inspect all information obtained and/or recorded in the course of the telemedicine visit and may receive copies of available information for a reasonable fee.  I understand that some of the potential risks of receiving the Services via telemedicine include:   Delay or interruption in medical evaluation due to technological equipment failure or disruption;  Information transmitted may not be sufficient (e.g. poor resolution of images) to allow for appropriate medical decision making by the Practitioner;  and/or   In rare instances, security protocols could fail, causing a breach of personal health information.  Furthermore, I acknowledge that it is my responsibility to provide information about my medical history, conditions and care that is complete and accurate to the best of my ability. I acknowledge that Practitioner's advice, recommendations, and/or decision may be based on factors not within their control, such as incomplete or inaccurate data provided by me or distortions of diagnostic images or specimens that may result from electronic transmissions. I understand that the practice of medicine is not an exact science and that Practitioner makes no warranties or guarantees regarding treatment outcomes. I acknowledge that a copy of this consent can be made available to me via my patient portal ([facility]), or I can request a printed copy by calling the office of [facility].    I understand that my insurance will be billed for this visit.   I have read or had this consent read to me.  I understand the contents of this consent, which adequately explains the benefits and risks of the Services being provided via telemedicine.   I have been provided ample opportunity to ask questions regarding this consent and the Services and have had my questions answered to my satisfaction.  I give my informed consent for the services to be provided through the use of telemedicine in my medical care

## 2020-02-02 NOTE — Patient Instructions (Addendum)
Medication Instructions:  Your physician recommends that you continue on your current medications as directed. Please refer to the Current Medication list given to you today.  *If you need a refill on your cardiac medications before your next appointment, please call your pharmacy*   Lab Work: Tomorrow, 02/03/20:  Come for BMET & TSH  05/02/20:  Come fasting for LIPID & CMET (anytime after 7:30)  If you have labs (blood work) drawn today and your tests are completely normal, you will receive your results only by: Marland Kitchen MyChart Message (if you have MyChart) OR . A paper copy in the mail If you have any lab test that is abnormal or we need to change your treatment, we will call you to review the results.   Testing/Procedures: None orderd   Follow-Up: At [facility], you and your health needs are our priority.  As part of our continuing mission to provide you with exceptional heart care, we have created designated Provider Care Teams.  These Care Teams include your primary Cardiologist (physician) and Advanced Practice Providers (APPs -  Physician Assistants and Nurse Practitioners) who all work together to provide you with the care you need, when you need it.  We recommend signing up for the patient portal called "MyChart".  Sign up information is provided on this After Visit Summary.  MyChart is used to connect with patients for Virtual Visits (Telemedicine).  Patients are able to view lab/test results, encounter notes, upcoming appointments, etc.  Non-urgent messages can be sent to your provider as well.   To learn more about what you can do with MyChart, go to [email or web address].    Your next appointment:   12 month(s)  The format for your next appointment:   In Person  Provider:   You may see [name], MD or one of the following Advanced Practice Providers on your designated Care Team:    Truitt Merle, NP  Cecilie Kicks, NP  Kathyrn Drown, NP    Other  Instructions  Continue to follow your blood pressure at home at least a few times a week. I would recommend using a blood pressure cuff that goes on your arm. The wrist ones can be inaccurate. If possible, try to select one that also reports your heart rate. To check your blood pressure, choose a time at least 3 hours after taking your blood pressure medicines. If you can sample it at different times of the day, that's great - it might give you more information about how your blood pressure fluctuates. Remain seated in a chair for 5 minutes quietly beforehand, then check it. Call your doctor if you tend to get readings of greater than 130 on the top number or 80 on the bottom number

## 2020-02-03 ENCOUNTER — Other Ambulatory Visit: Payer: Medicare Other

## 2020-02-04 ENCOUNTER — Other Ambulatory Visit: Payer: Medicare Other | Admitting: *Deleted

## 2020-02-04 ENCOUNTER — Other Ambulatory Visit: Payer: Self-pay

## 2020-02-04 DIAGNOSIS — I10 Essential (primary) hypertension: Secondary | ICD-10-CM

## 2020-02-04 DIAGNOSIS — I255 Ischemic cardiomyopathy: Secondary | ICD-10-CM | POA: Diagnosis not present

## 2020-02-04 DIAGNOSIS — I2510 Atherosclerotic heart disease of native coronary artery without angina pectoris: Secondary | ICD-10-CM

## 2020-02-04 DIAGNOSIS — E785 Hyperlipidemia, unspecified: Secondary | ICD-10-CM

## 2020-02-04 LAB — BASIC METABOLIC PANEL
BUN/Creatinine Ratio: 13 (ref 10–24)
BUN: 16 mg/dL (ref 8–27)
CO2: 24 mmol/L (ref 20–29)
Calcium: 9.4 mg/dL (ref 8.6–10.2)
Chloride: 106 mmol/L (ref 96–106)
Creatinine, Ser: 1.25 mg/dL (ref 0.76–1.27)
GFR calc Af Amer: 63 mL/min/{1.73_m2} (ref >59)
GFR calc non Af Amer: 55 mL/min/{1.73_m2} — ABNORMAL LOW (ref >59)
Glucose: 95 mg/dL (ref 65–99)
Potassium: 4.5 mmol/L (ref 3.5–5.2)
Sodium: 141 mmol/L (ref 134–144)

## 2020-02-04 LAB — TSH: TSH: 0.688 u[IU]/mL (ref 0.450–4.500)

## 2020-02-05 NOTE — Progress Notes (Signed)
Pt has been made aware of normal result and verbalized understanding.  jw

## 2020-03-04 ENCOUNTER — Other Ambulatory Visit: Payer: Self-pay | Admitting: Interventional Cardiology

## 2020-03-21 ENCOUNTER — Other Ambulatory Visit: Payer: Self-pay | Admitting: Emergency Medicine

## 2020-03-21 DIAGNOSIS — G4700 Insomnia, unspecified: Secondary | ICD-10-CM

## 2020-03-21 NOTE — Telephone Encounter (Signed)
Requested Prescriptions  Pending Prescriptions Disp Refills  . traZODone (DESYREL) 100 MG tablet [Pharmacy Med Name: TRAZODONE 100 MG TABLET] 90 tablet 0    Sig: TAKE 1 TABLET (100 MG TOTAL) BY MOUTH AT BEDTIME AS NEEDED FOR SLEEP.     Psychiatry: Antidepressants - Serotonin Modulator Passed - 03/21/2020 11:26 AM      Passed - Valid encounter within last 6 months    Recent Outpatient Visits          5 months ago Insomnia, unspecified type   Primary Care at [facility], [place], MD   11 months ago Herpes zoster without complication   Primary Care at [facility], Lorelee Market, NP   1 year ago Herpes zoster without complication   Primary Care at [facility], Ines Bloomer, MD   1 year ago Abscess of left nipple   Primary Care at [facility], Ines Bloomer, MD   1 year ago Abscess of left nipple   Primary Care at [facility], Ines Bloomer, MD

## 2020-04-18 ENCOUNTER — Other Ambulatory Visit: Payer: Self-pay | Admitting: Interventional Cardiology

## 2020-04-23 ENCOUNTER — Other Ambulatory Visit: Payer: Self-pay | Admitting: Cardiology

## 2020-04-26 ENCOUNTER — Other Ambulatory Visit: Payer: Self-pay | Admitting: Interventional Cardiology

## 2020-05-02 ENCOUNTER — Other Ambulatory Visit: Payer: Self-pay

## 2020-05-02 ENCOUNTER — Encounter (HOSPITAL_COMMUNITY): Payer: Self-pay

## 2020-05-02 ENCOUNTER — Emergency Department (HOSPITAL_COMMUNITY)
Admission: EM | Admit: 2020-05-02 | Discharge: 2020-05-02 | Disposition: A | Discharge status: Home / Self Care | Payer: Medicare Other | Attending: Emergency Medicine | Admitting: Emergency Medicine

## 2020-05-02 ENCOUNTER — Other Ambulatory Visit: Payer: Medicare Other

## 2020-05-02 DIAGNOSIS — I2510 Atherosclerotic heart disease of native coronary artery without angina pectoris: Secondary | ICD-10-CM | POA: Diagnosis not present

## 2020-05-02 DIAGNOSIS — R1030 Lower abdominal pain, unspecified: Secondary | ICD-10-CM | POA: Insufficient documentation

## 2020-05-02 DIAGNOSIS — Z7982 Long term (current) use of aspirin: Secondary | ICD-10-CM | POA: Diagnosis not present

## 2020-05-02 DIAGNOSIS — I10 Essential (primary) hypertension: Secondary | ICD-10-CM | POA: Insufficient documentation

## 2020-05-02 DIAGNOSIS — Z79899 Other long term (current) drug therapy: Secondary | ICD-10-CM | POA: Insufficient documentation

## 2020-05-02 DIAGNOSIS — Z955 Presence of coronary angioplasty implant and graft: Secondary | ICD-10-CM | POA: Insufficient documentation

## 2020-05-02 DIAGNOSIS — Z859 Personal history of malignant neoplasm, unspecified: Secondary | ICD-10-CM | POA: Insufficient documentation

## 2020-05-02 DIAGNOSIS — R39198 Other difficulties with micturition: Secondary | ICD-10-CM | POA: Diagnosis present

## 2020-05-02 DIAGNOSIS — R339 Retention of urine, unspecified: Secondary | ICD-10-CM | POA: Diagnosis not present

## 2020-05-02 LAB — URINALYSIS, ROUTINE W REFLEX MICROSCOPIC
Bacteria, UA: NONE SEEN
Bilirubin Urine: NEGATIVE
Glucose, UA: NEGATIVE mg/dL
Ketones, ur: NEGATIVE mg/dL
Leukocytes,Ua: NEGATIVE
Nitrite: NEGATIVE
Protein, ur: NEGATIVE mg/dL
Specific Gravity, Urine: 1.01 (ref 1.005–1.030)
pH: 6 (ref 5.0–8.0)

## 2020-05-02 MED ORDER — TETANUS-DIPHTH-ACELL PERTUSSIS 5-2.5-18.5 LF-MCG/0.5 IM SUSY: 0.5000 mL | PREFILLED_SYRINGE | Freq: Once | INTRAMUSCULAR | Status: DC

## 2020-05-02 MED ORDER — LIDOCAINE HCL (PF) 1 % IJ SOLN: 5.0000 mL | Freq: Once | INTRAMUSCULAR | Status: DC

## 2020-05-02 NOTE — ED Triage Notes (Signed)
Pt arrived via [facility] from home for c/o 9/10 pain in groin bilat and unable to urinate started at 0300 today. Pt is NSR on monitor. Pt is hypertensive.

## 2020-05-02 NOTE — Discharge Instructions (Signed)
You are seen here for urinary retention.  Please leave  Foley catheter in and change the bag as directed by the nurse.  You may to continuing taking your medications as prescribed.  You must follow-up with your urologist in 1 week's time for reevaluation.  Please call them tomorrow to schedule an appointment.  Come back to the emergency department if you develop chest pain, shortness of breath, severe abdominal pain, uncontrolled nausea, vomiting, diarrhea.

## 2020-05-02 NOTE — ED Provider Notes (Signed)
[facility] EMERGENCY DEPARTMENT Provider Note   CSN: [id number] Arrival date & time: 05/02/20  1136     History Chief Complaint  Patient presents with  . unable to urinate    Eric [name] is a 80 y.o. male.  HPI   Patient with significant medical history of stage IV follicular lymphoma currently on chop/Rituxan, enlarged prostate, hypertension, CAD presents to the emergency department with chief complaint of difficulty to urinate.  Patient endorses that he has been unable to urinate since 3 AM this morning, he endorses pain in his lower abdomen around his bladder, and pain that radiates to the distal end of his penis.  He denies penile discharge, testicular pain, hematuria, dysuria, urinary urgency or frequency, denies flank pain, abdominal pain, nausea, vomiting, diarrhea.  He has no previous history of urinary retention, has never had a Foley catheter, no history of kidney stones or pyelonephritis.  He states he has had difficulty with urination in the past generally he can urinate once and then it is easier the second time.  He denies any alleviating factors.  Patient denies headaches, fevers, chills, shortness breath, chest pain, abdominal pain, nausea, vomiting, diarrhea, worsening pedal edema.  Past Medical History:  Diagnosis Date  . Cancer ([place])   . Coronary atherosclerosis of native coronary artery    a. s/p MI with DES/PCI to the RCA in 2013.  . Encounter for antineoplastic chemotherapy 06/20/2015  . Essential hypertension   . GERD (gastroesophageal reflux disease)   . History of heart attack   . Hyperlipidemia   . Ischemic cardiomyopathy   . Myocardial infarction ([place])   . Non Hodgkin's lymphoma ([place])   . Plavix resistance    Plavix hyporesponder 2013  . Sinus bradycardia    baseline HR 50s at times    Patient Active Problem List   Diagnosis Date Noted  . Herpes zoster without complication 01/75/1025  . Hypercholesterolemia 03/25/2016  . Grade  1 follicular lymphoma of lymph nodes of multiple regions ([place]) 02/16/2015  . CAD in native artery 08/18/2014  . Hearing loss 07/11/2012  . Insomnia, idiopathic 07/11/2012  . Erectile dysfunction 07/11/2012  . Non-STEMI (non-ST elevated myocardial infarction) ([place]) 03/06/2012  . Lymphoma ([place]) 06/05/2011    Class: Chronic    Past Surgical History:  Procedure Laterality Date  . CORONARY STENT PLACEMENT    . LEFT HEART CATHETERIZATION WITH CORONARY ANGIOGRAM N/A 03/03/2012   Procedure: LEFT HEART CATHETERIZATION WITH CORONARY ANGIOGRAM;  Surgeon: [name], MD;  Location: [facility] CATH LAB;  Service: Cardiovascular;  Laterality: N/A;  . PERCUTANEOUS CORONARY STENT INTERVENTION (PCI-S) N/A 03/04/2012   Procedure: PERCUTANEOUS CORONARY STENT INTERVENTION (PCI-S);  Surgeon: [name], MD;  Location: [facility] CATH LAB;  Service: Cardiovascular;  Laterality: N/A;       Family History  Problem Relation Age of Onset  . Leukemia Mother     Social History   Tobacco Use  . Smoking status: Never Smoker  . Smokeless tobacco: Never Used  Vaping Use  . Vaping Use: Never used  Substance Use Topics  . Alcohol use: Yes    Comment: occasionally  . Drug use: No    Home Medications Prior to Admission medications   Medication Sig Start Date End Date Taking? Authorizing Provider  aspirin EC 81 MG tablet Take 81 mg by mouth at bedtime.    [provider]  atorvastatin (LIPITOR) 40 MG tablet TAKE 1 TABLET BY MOUTH DAILY AT 6 PM 01/26/20  Kathyrn Drown D, NP  diphenhydrAMINE (BENADRYL) 25 MG tablet Take 25 mg by mouth at bedtime as needed for sleep.     [provider]  losartan (COZAAR) 100 MG tablet TAKE 1 TABLET BY MOUTH EVERY DAY 04/26/20   Belva Crome, MD  omeprazole (PRILOSEC) 20 MG capsule Take 20 mg by mouth daily as needed (acid relfux).    [provider]  Polyethylene Glycol 3350 (PEG 3350) POWD Take by mouth. 02/11/18   [provider]  sildenafil  (VIAGRA) 100 MG tablet Take 100 mg by mouth daily as needed. 12/10/17   [provider]  traZODone (DESYREL) 100 MG tablet TAKE 1 TABLET (100 MG TOTAL) BY MOUTH AT BEDTIME AS NEEDED FOR SLEEP. 03/21/20   Horald Pollen, MD    Allergies    Neosporin [neomycin-bacitracin zn-polymyx]  Review of Systems   Review of Systems  Constitutional: Negative for chills and fever.  HENT: Negative for congestion and sinus pressure.   Respiratory: Negative for shortness of breath.   Cardiovascular: Negative for chest pain.  Gastrointestinal: Negative for abdominal pain.  Genitourinary: Positive for decreased urine volume and difficulty urinating. Negative for dysuria, enuresis, flank pain, hematuria, penile swelling, scrotal swelling and testicular pain.  Musculoskeletal: Negative for back pain.  Skin: Negative for rash.  Neurological: Negative for headaches.  Hematological: Does not bruise/bleed easily.    Physical Exam Updated Vital Signs BP 139/69   Pulse (!) 54   Temp 98.1 F (36.7 C) (Oral)   Resp 13   Ht 5\' 7"  (1.702 m)   Wt 68 kg   SpO2 99%   BMI 23.49 kg/m   Physical Exam Vitals and nursing note reviewed.  Constitutional:      General: He is not in acute distress.    Appearance: He is not ill-appearing.  HENT:     Head: Normocephalic and atraumatic.     Nose: No congestion.  Eyes:     Conjunctiva/sclera: Conjunctivae normal.  Cardiovascular:     Rate and Rhythm: Normal rate and regular rhythm.     Pulses: Normal pulses.     Heart sounds: No murmur heard. No friction rub. No gallop.   Pulmonary:     Effort: No respiratory distress.     Breath sounds: No wheezing, rhonchi or rales.  Abdominal:     Palpations: Abdomen is soft.     Tenderness: There is abdominal tenderness. There is no right CVA tenderness or left CVA tenderness.     Comments: Patient abdomen was visualized, nondistended, normoactive bowel sounds, dull to percussion.  He was noted to be  slightly tender to palpation his pubic area, felt somewhat distended  in that area, negative Murphy sign, negative McBurney point, no rebound tenderness or peritoneal sign present.  Negative CVA tenderness.  Musculoskeletal:     Right lower leg: No edema.     Left lower leg: No edema.  Skin:    General: Skin is warm and dry.  Neurological:     Mental Status: He is alert.  Psychiatric:        Mood and Affect: Mood normal.     ED Results / Procedures / Treatments   Labs (all labs ordered are listed, but only abnormal results are displayed) Labs Reviewed  URINALYSIS, ROUTINE W REFLEX MICROSCOPIC - Abnormal; Notable for the following components:      Result Value   Hgb urine dipstick SMALL (*)    All other components within normal limits  EKG None  Radiology No results found.  Procedures Procedures   Medications Ordered in ED Medications - No data to display  ED Course  I have reviewed the triage vital signs and the nursing notes.  Pertinent labs & imaging results that were available during my care of the patient were reviewed by me and considered in my medical decision making (see chart for details).    MDM Rules/Calculators/A&P                          Initial impression-patient presents with urinary retention.  He is alert, does not appear in acute distress, vital signs show hypertension.  Concern for urinary retention, will place Foley cath and obtain UA for further evaluation.  Work-up-UA negative for signs of infection.  Patient had drained 700 mL of fluid.  Reassessment patient reassessed after placing Foley catheter, states she feels much better at this time, vital signs have improved immensely.  He has no other complaints.  Rule out- low suspicion for kidney stones as patient has no CVA tenderness, there is no noted blood in the urine, has no history of kidney stones in the past.  Low suspicion for [place] or UTI as urine is unremarkable for signs of infection.   Low suspicion for epididymitis or prostatitis as patient denies penile discharge, testicular pain, denies saddle pain.  Low suspicion for intra-abdominal abnormality as patient is tolerating p.o., abdomen soft nontender to palpation.  Plan-I suspect patient suffering from urinary retention due to enlarged prostate, patient benefit from Foley catheter.  Will remain in place and recommend he follows up with urology for further evaluation.  Vital signs have remained stable, no indication for hospital admission.  Patient discussed with attending and they agreed with assessment and plan.  Patient given at home care as well strict return precautions.  Patient verbalized that they understood agreed to said plan.   Final Clinical Impression(s) / ED Diagnoses Final diagnoses:  Urinary retention    Rx / DC Orders ED Discharge Orders    None       Khoury, Siemon, PA-C 05/02/20 1533    Davonna Belling, MD 05/02/20 [phone]

## 2020-05-08 ENCOUNTER — Other Ambulatory Visit: Payer: Self-pay

## 2020-05-08 ENCOUNTER — Emergency Department (HOSPITAL_COMMUNITY)
Admission: EM | Admit: 2020-05-08 | Discharge: 2020-05-08 | Disposition: A | Discharge status: Home / Self Care | Payer: Medicare Other | Attending: Emergency Medicine | Admitting: Emergency Medicine

## 2020-05-08 ENCOUNTER — Encounter (HOSPITAL_COMMUNITY): Payer: Self-pay | Admitting: Emergency Medicine

## 2020-05-08 DIAGNOSIS — R339 Retention of urine, unspecified: Secondary | ICD-10-CM | POA: Diagnosis not present

## 2020-05-08 DIAGNOSIS — Z7982 Long term (current) use of aspirin: Secondary | ICD-10-CM | POA: Diagnosis not present

## 2020-05-08 DIAGNOSIS — Z955 Presence of coronary angioplasty implant and graft: Secondary | ICD-10-CM | POA: Diagnosis not present

## 2020-05-08 DIAGNOSIS — Z859 Personal history of malignant neoplasm, unspecified: Secondary | ICD-10-CM | POA: Diagnosis not present

## 2020-05-08 DIAGNOSIS — I2510 Atherosclerotic heart disease of native coronary artery without angina pectoris: Secondary | ICD-10-CM | POA: Diagnosis not present

## 2020-05-08 DIAGNOSIS — R140 Abdominal distension (gaseous): Secondary | ICD-10-CM | POA: Insufficient documentation

## 2020-05-08 DIAGNOSIS — Z79899 Other long term (current) drug therapy: Secondary | ICD-10-CM | POA: Diagnosis not present

## 2020-05-08 DIAGNOSIS — I10 Essential (primary) hypertension: Secondary | ICD-10-CM | POA: Insufficient documentation

## 2020-05-08 DIAGNOSIS — R39198 Other difficulties with micturition: Secondary | ICD-10-CM | POA: Diagnosis present

## 2020-05-08 LAB — URINALYSIS, ROUTINE W REFLEX MICROSCOPIC
Bacteria, UA: NONE SEEN
Bilirubin Urine: NEGATIVE
Glucose, UA: NEGATIVE mg/dL
Ketones, ur: NEGATIVE mg/dL
Leukocytes,Ua: NEGATIVE
Nitrite: NEGATIVE
Protein, ur: 30 mg/dL — AB
RBC / HPF: >50 RBC/hpf — ABNORMAL HIGH (ref 0–5)
Specific Gravity, Urine: 1.011 (ref 1.005–1.030)
pH: 8 (ref 5.0–8.0)

## 2020-05-08 LAB — I-STAT CHEM 8, ED
BUN: 13 mg/dL (ref 8–23)
Calcium, Ion: 1.11 mmol/L — ABNORMAL LOW (ref 1.15–1.40)
Chloride: 102 mmol/L (ref 98–111)
Creatinine, Ser: 1.2 mg/dL (ref 0.61–1.24)
Glucose, Bld: 110 mg/dL — ABNORMAL HIGH (ref 70–99)
HCT: 38 % — ABNORMAL LOW (ref 39.0–52.0)
Hemoglobin: 12.9 g/dL — ABNORMAL LOW (ref 13.0–17.0)
Potassium: 3.3 mmol/L — ABNORMAL LOW (ref 3.5–5.1)
Sodium: 143 mmol/L (ref 135–145)
TCO2: 26 mmol/L (ref 22–32)

## 2020-05-08 MED ORDER — TAMSULOSIN HCL 0.4 MG PO CAPS: 0.4000 mg | ORAL_CAPSULE | Freq: Every day | ORAL | 0 refills | Status: AC

## 2020-05-08 MED ORDER — TAMSULOSIN HCL 0.4 MG PO CAPS: 0.4000 mg | ORAL_CAPSULE | Freq: Every day | ORAL | 0 refills | Status: DC

## 2020-05-08 NOTE — Discharge Instructions (Addendum)
It was our pleasure taking care of you in the emergency department today.  Keep the Foley catheter in place until urology tells you to remove the  Begin taking the Flomax  Follow up with urology tomorrow  Return for new or worsening symptoms

## 2020-05-08 NOTE — ED Provider Notes (Signed)
[facility] EMERGENCY DEPARTMENT Provider Note   CSN: [id number] Arrival date & time: 05/08/20  1013     History Chief Complaint  Patient presents with  . Urinary Retention    Eric [name] is a 80 y.o. male with past medical history significant for cardiomyopathy, stage IV follicular lymphoma, currently on Rituxan, BP pH, hypertension who presents for evaluation of difficulty with urination. Was seen here last week. Had Foley catheter placed. Patient removed on Friday, 2 days PTA. Has been in touch with urology who follows at [facility]. He was started on Flomax. He is supposed to have a scope tomorrow. Patient states he completed his Flomax yesterday. Woke up this morning was unable to urinate. Has had similar symptoms in the past. Has some suprapubic fullness. He denies any fever, chills, nausea, vomiting, hematuria, penile pain, diarrhea, weakness. States he has had difficulty in the past with urination. No alleviating factors. He feels like his suprapubic pain is due to not being able to void. Denies additional aggravating or alleviating factors. Did take Benadryl for sleep which she feels causes urinary retention.  History obtained from patient and past medical records. No interpreter used.  Per urology note. Patient was supposed to DC his Foley tomorrow morning. He was then supposed to be seen in office at 1:00.    HPI     Past Medical History:  Diagnosis Date  . Cancer ([place])   . Coronary atherosclerosis of native coronary artery    a. s/p MI with DES/PCI to the RCA in 2013.  . Encounter for antineoplastic chemotherapy 06/20/2015  . Essential hypertension   . GERD (gastroesophageal reflux disease)   . History of heart attack   . Hyperlipidemia   . Ischemic cardiomyopathy   . Myocardial infarction ([place])   . Non Hodgkin's lymphoma ([place])   . Plavix resistance    Plavix hyporesponder 2013  . Sinus bradycardia    baseline HR 50s at times    Patient  Active Problem List   Diagnosis Date Noted  . Herpes zoster without complication 78/46/9629  . Hypercholesterolemia 03/25/2016  . Grade 1 follicular lymphoma of lymph nodes of multiple regions ([place]) 02/16/2015  . CAD in native artery 08/18/2014  . Hearing loss 07/11/2012  . Insomnia, idiopathic 07/11/2012  . Erectile dysfunction 07/11/2012  . Non-STEMI (non-ST elevated myocardial infarction) ([place]) 03/06/2012  . Lymphoma ([place]) 06/05/2011    Class: Chronic    Past Surgical History:  Procedure Laterality Date  . CORONARY STENT PLACEMENT    . LEFT HEART CATHETERIZATION WITH CORONARY ANGIOGRAM N/A 03/03/2012   Procedure: LEFT HEART CATHETERIZATION WITH CORONARY ANGIOGRAM;  Surgeon: [name], MD;  Location: [facility] CATH LAB;  Service: Cardiovascular;  Laterality: N/A;  . PERCUTANEOUS CORONARY STENT INTERVENTION (PCI-S) N/A 03/04/2012   Procedure: PERCUTANEOUS CORONARY STENT INTERVENTION (PCI-S);  Surgeon: [name], MD;  Location: [facility] CATH LAB;  Service: Cardiovascular;  Laterality: N/A;       Family History  Problem Relation Age of Onset  . Leukemia Mother     Social History   Tobacco Use  . Smoking status: Never Smoker  . Smokeless tobacco: Never Used  Vaping Use  . Vaping Use: Never used  Substance Use Topics  . Alcohol use: Yes    Comment: occasionally  . Drug use: No    Home Medications Prior to Admission medications   Medication Sig Start Date End Date Taking? Authorizing Provider  tamsulosin (FLOMAX) 0.4 MG CAPS capsule Take 1  capsule (0.4 mg total) by mouth daily for 5 days. 05/08/20 05/13/20 Yes Lenis Nettleton A, PA-C  aspirin EC 81 MG tablet Take 81 mg by mouth at bedtime.    [provider]  atorvastatin (LIPITOR) 40 MG tablet TAKE 1 TABLET BY MOUTH DAILY AT 6 PM 01/26/20   Tommie Raymond, NP  diphenhydrAMINE (BENADRYL) 25 MG tablet Take 25 mg by mouth at bedtime as needed for sleep.     [provider]  losartan (COZAAR) 100 MG tablet  TAKE 1 TABLET BY MOUTH EVERY DAY 04/26/20   Belva Crome, MD  omeprazole (PRILOSEC) 20 MG capsule Take 20 mg by mouth daily as needed (acid relfux).    [provider]  Polyethylene Glycol 3350 (PEG 3350) POWD Take by mouth. 02/11/18   [provider]  sildenafil (VIAGRA) 100 MG tablet Take 100 mg by mouth daily as needed. 12/10/17   [provider]  traZODone (DESYREL) 100 MG tablet TAKE 1 TABLET (100 MG TOTAL) BY MOUTH AT BEDTIME AS NEEDED FOR SLEEP. 03/21/20   Horald Pollen, MD    Allergies    Neosporin [neomycin-bacitracin zn-polymyx]  Review of Systems   Review of Systems  Constitutional: Negative.   HENT: Negative.   Respiratory: Negative.   Cardiovascular: Negative.   Gastrointestinal:       Suprapubic fullness  Genitourinary: Positive for difficulty urinating.  Musculoskeletal: Negative.   Skin: Negative.   Neurological: Negative.   All other systems reviewed and are negative.   Physical Exam Updated Vital Signs BP 126/78   Pulse (!) 57   Temp 98.1 F (36.7 C) (Oral)   Resp 18   SpO2 98%   Physical Exam Vitals and nursing note reviewed.  Constitutional:      General: He is not in acute distress.    Appearance: He is well-developed and well-nourished. He is not ill-appearing, toxic-appearing or diaphoretic.  HENT:     Head: Normocephalic and atraumatic.     Nose: Nose normal.     Mouth/Throat:     Mouth: Mucous membranes are moist.  Eyes:     Pupils: Pupils are equal, round, and reactive to light.  Cardiovascular:     Rate and Rhythm: Normal rate and regular rhythm.     Pulses: Normal pulses.     Heart sounds: Normal heart sounds.  Pulmonary:     Effort: Pulmonary effort is normal. No respiratory distress.     Breath sounds: Normal breath sounds.  Abdominal:     General: Bowel sounds are normal. There is no distension.     Palpations: Abdomen is soft.     Tenderness: There is no right CVA tenderness, left CVA tenderness,  guarding or rebound.     Comments: Suprapubic distention  Musculoskeletal:        General: Normal range of motion.     Cervical back: Normal range of motion and neck supple.     Comments: Moves all 4 extremities at difficulty  Skin:    General: Skin is warm and dry.     Capillary Refill: Capillary refill takes less than 2 seconds.  Neurological:     General: No focal deficit present.     Mental Status: He is alert and oriented to person, place, and time.     Comments: Ambulatory with out difficulty Cranial nerves II through grossly intact  Psychiatric:        Mood and Affect: Mood and affect normal.     ED  Results / Procedures / Treatments   Labs (all labs ordered are listed, but only abnormal results are displayed) Labs Reviewed  URINALYSIS, ROUTINE W REFLEX MICROSCOPIC - Abnormal; Notable for the following components:      Result Value   Hgb urine dipstick LARGE (*)    Protein, ur 30 (*)    RBC / HPF >50 (*)    All other components within normal limits  I-STAT CHEM 8, ED - Abnormal; Notable for the following components:   Potassium 3.3 (*)    Glucose, Bld 110 (*)    Calcium, Ion 1.11 (*)    Hemoglobin 12.9 (*)    HCT 38.0 (*)    All other components within normal limits  URINE CULTURE    EKG None  Radiology No results found.  Procedures Procedures   Medications Ordered in ED Medications - No data to display  ED Course  I have reviewed the triage vital signs and the nursing notes.  Pertinent labs & imaging results that were available during my care of the patient were reviewed by me and considered in my medical decision making (see chart for details).   80 year old here for urinary retention. Seen last week for similar. Had removed his Foley 2 days ago. He supposed to be seen by urology tomorrow and have a scope. Per urology note he was supposed remove his Foley tomorrow morning for his upcoming afternoon appointment. Has been taking some Benadryl to help with  some sleep. Completed his Flomax yesterday. Unable to void since waking up this morning. He denies any hematuria, flank pain. States this feels similar to his prior episodes of urinary retention. His heart and lungs are clear. He has no systemic symptoms. Plan on bladder scan, Place Foley, check kidney function, UA.  Foley catheter placed.  Has leg bag.  He appears comfortable.  No pain. Gotten out >600cc urine. Discussed restarting Flomax per his last urology note, and follow-up with them tomorrow.  His kidney function is at baseline.  UA does not appear infected.  No flank pain to suggest renal stones.  Likely retention from Benadryl use, enlarged prostate.  Repeat blood pressure significantly improved.  Patient is nontoxic, nonseptic appearing, in no apparent distress.  Patient's pain and other symptoms adequately managed in emergency department.  Fluid bolus given.  Labs, imaging and vitals reviewed.  Patient does not meet the SIRS or Sepsis criteria.  On repeat exam patient does not have a surgical abdomin and there are no peritoneal signs.  No indication of appendicitis, bowel obstruction, bowel perforation, cholecystitis, diverticulitis.  The patient has been appropriately medically screened and/or stabilized in the ED. I have low suspicion for any other emergent medical condition which would require further screening, evaluation or treatment in the ED or require inpatient management.  Patient is hemodynamically stable and in no acute distress.  Patient able to ambulate in department prior to ED.  Evaluation does not show acute pathology that would require ongoing or additional emergent interventions while in the emergency department or further inpatient treatment.  I have discussed the diagnosis with the patient and answered all questions.  Pain is been managed while in the emergency department and patient has no further complaints prior to discharge.  Patient is comfortable with plan discussed in  room and is stable for discharge at this time.  I have discussed strict return precautions for returning to the emergency department.  Patient was encouraged to follow-up with PCP/specialist refer to at discharge.  MDM Rules/Calculators/A&P                           Final Clinical Impression(s) / ED Diagnoses Final diagnoses:  Urinary retention    Rx / DC Orders ED Discharge Orders         Ordered    tamsulosin (FLOMAX) 0.4 MG CAPS capsule  Daily        05/08/20 1216           Chai Routh A, PA-C 05/08/20 1219    Tegeler, Gwenyth Allegra, MD 05/08/20 [phone]

## 2020-05-08 NOTE — ED Triage Notes (Signed)
C/o urinary retention since 6am.  Seen in ED on Monday for same.

## 2020-05-09 DIAGNOSIS — R310 Gross hematuria: Secondary | ICD-10-CM | POA: Diagnosis not present

## 2020-05-09 DIAGNOSIS — R338 Other retention of urine: Secondary | ICD-10-CM | POA: Diagnosis not present

## 2020-05-09 DIAGNOSIS — N401 Enlarged prostate with lower urinary tract symptoms: Secondary | ICD-10-CM | POA: Diagnosis not present

## 2020-05-09 LAB — URINE CULTURE: Culture: NO GROWTH

## 2020-05-12 DIAGNOSIS — N401 Enlarged prostate with lower urinary tract symptoms: Secondary | ICD-10-CM | POA: Diagnosis not present

## 2020-05-12 DIAGNOSIS — R339 Retention of urine, unspecified: Secondary | ICD-10-CM | POA: Diagnosis not present

## 2020-05-12 DIAGNOSIS — R972 Elevated prostate specific antigen [PSA]: Secondary | ICD-10-CM | POA: Diagnosis not present

## 2020-05-12 DIAGNOSIS — N5201 Erectile dysfunction due to arterial insufficiency: Secondary | ICD-10-CM | POA: Diagnosis not present

## 2020-05-31 ENCOUNTER — Telehealth: Payer: Self-pay | Admitting: Medical Oncology

## 2020-05-31 NOTE — Telephone Encounter (Signed)
It is okay to give him potassium supplement if we have the results of his blood work.  Thank you.

## 2020-05-31 NOTE — Telephone Encounter (Signed)
"  My K+ is low . Can Mohamed prescribe me some K+ tablets ? ( drawn in ED) . K+ supplement was not ordered by ED.  I instructed  him to contact PCP.   He insisted Julien Nordmann would order it .   I explained that [facility]  did not order the K+ test therefore he needs to contact PCP.

## 2020-06-01 ENCOUNTER — Other Ambulatory Visit: Payer: Self-pay | Admitting: Medical Oncology

## 2020-06-01 DIAGNOSIS — E876 Hypokalemia: Secondary | ICD-10-CM

## 2020-06-01 MED ORDER — POTASSIUM CHLORIDE CRYS ER 20 MEQ PO TBCR: 20.0000 meq | EXTENDED_RELEASE_TABLET | Freq: Every day | ORAL | 0 refills | Status: DC

## 2020-06-02 DIAGNOSIS — N390 Urinary tract infection, site not specified: Secondary | ICD-10-CM | POA: Diagnosis not present

## 2020-06-02 DIAGNOSIS — N3000 Acute cystitis without hematuria: Secondary | ICD-10-CM | POA: Diagnosis not present

## 2020-06-02 DIAGNOSIS — N451 Epididymitis: Secondary | ICD-10-CM | POA: Diagnosis not present

## 2020-06-06 ENCOUNTER — Other Ambulatory Visit: Payer: Self-pay | Admitting: Internal Medicine

## 2020-06-06 DIAGNOSIS — E876 Hypokalemia: Secondary | ICD-10-CM

## 2020-06-09 DIAGNOSIS — N138 Other obstructive and reflux uropathy: Secondary | ICD-10-CM | POA: Diagnosis not present

## 2020-06-09 DIAGNOSIS — Z466 Encounter for fitting and adjustment of urinary device: Secondary | ICD-10-CM | POA: Diagnosis not present

## 2020-06-09 DIAGNOSIS — N401 Enlarged prostate with lower urinary tract symptoms: Secondary | ICD-10-CM | POA: Diagnosis not present

## 2020-06-09 DIAGNOSIS — R972 Elevated prostate specific antigen [PSA]: Secondary | ICD-10-CM | POA: Diagnosis not present

## 2020-06-29 ENCOUNTER — Inpatient Hospital Stay: Payer: Medicare Other

## 2020-06-29 ENCOUNTER — Inpatient Hospital Stay: Payer: Medicare Other | Attending: Internal Medicine | Admitting: Internal Medicine

## 2020-06-29 ENCOUNTER — Other Ambulatory Visit: Payer: Self-pay

## 2020-06-29 VITALS — BP 139/64 | HR 75 | Temp 97.4°F | Resp 19 | Ht 67.0 in | Wt 139.7 lb

## 2020-06-29 DIAGNOSIS — C8208 Follicular lymphoma grade I, lymph nodes of multiple sites: Secondary | ICD-10-CM

## 2020-06-29 DIAGNOSIS — Z9221 Personal history of antineoplastic chemotherapy: Secondary | ICD-10-CM | POA: Insufficient documentation

## 2020-06-29 DIAGNOSIS — Z79899 Other long term (current) drug therapy: Secondary | ICD-10-CM | POA: Insufficient documentation

## 2020-06-29 DIAGNOSIS — Z8572 Personal history of non-Hodgkin lymphomas: Secondary | ICD-10-CM | POA: Diagnosis not present

## 2020-06-29 DIAGNOSIS — C3490 Malignant neoplasm of unspecified part of unspecified bronchus or lung: Secondary | ICD-10-CM | POA: Diagnosis not present

## 2020-06-29 LAB — CMP (CANCER CENTER ONLY)
ALT: 12 U/L (ref 0–44)
AST: 21 U/L (ref 15–41)
Albumin: 3.8 g/dL (ref 3.5–5.0)
Alkaline Phosphatase: 106 U/L (ref 38–126)
Anion gap: 9 (ref 5–15)
BUN: 17 mg/dL (ref 8–23)
CO2: 26 mmol/L (ref 22–32)
Calcium: 9.2 mg/dL (ref 8.9–10.3)
Chloride: 106 mmol/L (ref 98–111)
Creatinine: 1.1 mg/dL (ref 0.61–1.24)
GFR, Estimated: >60 mL/min (ref >60)
Glucose, Bld: 83 mg/dL (ref 70–99)
Potassium: 4.3 mmol/L (ref 3.5–5.1)
Sodium: 141 mmol/L (ref 135–145)
Total Bilirubin: 0.6 mg/dL (ref 0.3–1.2)
Total Protein: 6.4 g/dL — ABNORMAL LOW (ref 6.5–8.1)

## 2020-06-29 LAB — CBC WITH DIFFERENTIAL (CANCER CENTER ONLY)
Abs Immature Granulocytes: 0.01 10*3/uL (ref 0.00–0.07)
Basophils Absolute: 0 10*3/uL (ref 0.0–0.1)
Basophils Relative: 1 %
Eosinophils Absolute: 0.2 10*3/uL (ref 0.0–0.5)
Eosinophils Relative: 5 %
HCT: 38.9 % — ABNORMAL LOW (ref 39.0–52.0)
Hemoglobin: 12.6 g/dL — ABNORMAL LOW (ref 13.0–17.0)
Immature Granulocytes: 0 %
Lymphocytes Relative: 26 %
Lymphs Abs: 1.2 10*3/uL (ref 0.7–4.0)
MCH: 27.6 pg (ref 26.0–34.0)
MCHC: 32.4 g/dL (ref 30.0–36.0)
MCV: 85.1 fL (ref 80.0–100.0)
Monocytes Absolute: 0.6 10*3/uL (ref 0.1–1.0)
Monocytes Relative: 14 %
Neutro Abs: 2.3 10*3/uL (ref 1.7–7.7)
Neutrophils Relative %: 54 %
Platelet Count: 230 10*3/uL (ref 150–400)
RBC: 4.57 MIL/uL (ref 4.22–5.81)
RDW: 15 % (ref 11.5–15.5)
WBC Count: 4.4 10*3/uL (ref 4.0–10.5)
nRBC: 0 % (ref 0.0–0.2)

## 2020-06-29 LAB — LACTATE DEHYDROGENASE: LDH: 178 U/L (ref 98–192)

## 2020-06-29 NOTE — Progress Notes (Signed)
[facility] Telephone:([phone]   [phone]  OFFICE PROGRESS NOTE  DIAGNOSIS: Bulky stage IV low-grade lymphoma diagnosed in November 2009.  PRIOR THERAPY: 1. Status post 7 cycles of systemic chemotherapy with CHOP/Rituxan. Last dose was given May 2010. 2. Status post 6 cycles of maintenance Rituxan 375 mg/sq m given every 2 months. Last dose was given on May 29, 2010, and the patient was lost to followup at that time. He received another dose on 12/11/2010, then again missed 2 doses. 3. systemic chemotherapy with Rituxan 375 mg/M2 on day 1 and that bendamustine 90 mg/M2 on days 1 and 2 every 4 weeks, status post 6 cycles. 4. Maintenance Rituxan 375 mg/M2 every 2 months, status post 12 cycles  5. Rituxan 375 MG/M2 given weekly for 4 weeks and this is followed by maintenance treatment every 2 months, status post 22 cycles discontinued secondary to disease progression. 6. Systemic chemotherapy with bendamustine 90 mg/M2 on days 1 and 2 and Rituxan 375 mg/M2 on day 1 every 4 weeks.  First dose March 03, 2018.  Status post 4 cycles.  CURRENT THERAPY: Observation.  INTERVAL HISTORY: Eric [name] 80 y.o. male returns to the clinic today for follow-up visit.  The patient is feeling fine today with no concerning complaints.  He denied having any current chest pain, shortness of breath, cough or hemoptysis.  He denied having any fever or chills.  There is no nausea, vomiting, diarrhea or constipation.  There is no headache or visual changes.  He has been in observation and doing well.  He is here today for evaluation and repeat blood work.   MEDICAL HISTORY: Past Medical History:  Diagnosis Date  . Cancer ([place])   . Coronary atherosclerosis of native coronary artery    a. s/p MI with DES/PCI to the RCA in 2013.  . Encounter for antineoplastic chemotherapy 06/20/2015  . Essential hypertension   . GERD (gastroesophageal reflux disease)   . History of heart  attack   . Hyperlipidemia   . Ischemic cardiomyopathy   . Myocardial infarction ([place])   . Non Hodgkin's lymphoma ([place])   . Plavix resistance    Plavix hyporesponder 2013  . Sinus bradycardia    baseline HR 50s at times    ALLERGIES:  is allergic to neosporin [neomycin-bacitracin zn-polymyx].  MEDICATIONS:  Current Outpatient Medications  Medication Sig Dispense Refill  . aspirin EC 81 MG tablet Take 81 mg by mouth at bedtime.    Marland Kitchen atorvastatin (LIPITOR) 40 MG tablet TAKE 1 TABLET BY MOUTH DAILY AT 6 PM 90 tablet 3  . diphenhydrAMINE (BENADRYL) 25 MG tablet Take 25 mg by mouth at bedtime as needed for sleep.     Marland Kitchen losartan (COZAAR) 100 MG tablet TAKE 1 TABLET BY MOUTH EVERY DAY 90 tablet 3  . omeprazole (PRILOSEC) 20 MG capsule Take 20 mg by mouth daily as needed (acid relfux).    . Polyethylene Glycol 3350 (PEG 3350) POWD Take by mouth.    . potassium chloride SA (KLOR-CON) 20 MEQ tablet Take 1 tablet (20 mEq total) by mouth daily for 7 days. 7 tablet 0  . sildenafil (VIAGRA) 100 MG tablet Take 100 mg by mouth daily as needed.  3  . traZODone (DESYREL) 100 MG tablet TAKE 1 TABLET (100 MG TOTAL) BY MOUTH AT BEDTIME AS NEEDED FOR SLEEP. 90 tablet 0   No current facility-administered medications for this visit.    SURGICAL HISTORY:  Past Surgical History:  Procedure Laterality Date  . CORONARY STENT PLACEMENT    . LEFT HEART CATHETERIZATION WITH CORONARY ANGIOGRAM N/A 03/03/2012   Procedure: LEFT HEART CATHETERIZATION WITH CORONARY ANGIOGRAM;  Surgeon: [name], MD;  Location: [facility] CATH LAB;  Service: Cardiovascular;  Laterality: N/A;  . PERCUTANEOUS CORONARY STENT INTERVENTION (PCI-S) N/A 03/04/2012   Procedure: PERCUTANEOUS CORONARY STENT INTERVENTION (PCI-S);  Surgeon: [name], MD;  Location: [facility] CATH LAB;  Service: Cardiovascular;  Laterality: N/A;    REVIEW OF SYSTEMS:  A comprehensive review of systems was negative.   PHYSICAL EXAMINATION: General appearance:  alert, cooperative and no distress Head: Normocephalic, without obvious abnormality, atraumatic Neck: no adenopathy, no JVD, supple, symmetrical, trachea midline and thyroid not enlarged, symmetric, no tenderness/mass/nodules Lymph nodes: Cervical, supraclavicular, and axillary nodes normal. Resp: clear to auscultation bilaterally Back: symmetric, no curvature. ROM normal. No CVA tenderness. Cardio: regular rate and rhythm, S1, S2 normal, no murmur, click, rub or gallop GI: soft, non-tender; bowel sounds normal; no masses,  no organomegaly Extremities: extremities normal, atraumatic, no cyanosis or edema  ECOG PERFORMANCE STATUS: 0 - Asymptomatic  Blood pressure 139/64, pulse 75, temperature (!) 97.4 F (36.3 C), temperature source Tympanic, resp. rate 19, height 5\' 7"  (1.702 m), weight 139 lb 11.2 oz (63.4 kg), SpO2 100 %.  LABORATORY DATA: Lab Results  Component Value Date   WBC 4.4 06/29/2020   HGB 12.6 (L) 06/29/2020   HCT 38.9 (L) 06/29/2020   MCV 85.1 06/29/2020   PLT 230 06/29/2020      Chemistry      Component Value Date/Time   NA 141 06/29/2020 1011   NA 141 02/04/2020 1239   NA 140 02/14/2017 0804   K 4.3 06/29/2020 1011   K 4.4 02/14/2017 0804   CL 106 06/29/2020 1011   CL 106 08/06/2012 1244   CO2 26 06/29/2020 1011   CO2 22 02/14/2017 0804   BUN 17 06/29/2020 1011   BUN 16 02/04/2020 1239   BUN 14.8 02/14/2017 0804   CREATININE 1.10 06/29/2020 1011   CREATININE 1.1 02/14/2017 0804      Component Value Date/Time   CALCIUM 9.2 06/29/2020 1011   CALCIUM 9.2 02/14/2017 0804   ALKPHOS 106 06/29/2020 1011   ALKPHOS 138 02/14/2017 0804   AST 21 06/29/2020 1011   AST 29 02/14/2017 0804   ALT 12 06/29/2020 1011   ALT 27 02/14/2017 0804   BILITOT 0.6 06/29/2020 1011   BILITOT 0.61 02/14/2017 0804       RADIOGRAPHIC STUDIES: No results found.  ASSESSMENT AND PLAN:  This is a very pleasant 80 years old African-American male with bulky stage IV follicular  lymphoma initially treated with CHOP/Rituxan followed by maintenance Rituxan. The patient had disease recurrence and he was restarted again on treatment with Rituxan every 2 months status post 22 cycles. The patient has been tolerating this treatment well with no concerning adverse effects. The patient was found to have progression in December 2019.  He was started on systemic chemotherapy with bendamustine and Rituxan status post 4 cycles.  He tolerated the fourth cycle of his treatment well except for mild fatigue. The patient is currently on observation and he is feeling fine today with no concerning complaints. His lab work today is unremarkable. I recommended for the patient to continue on observation with repeat labs and CT scan of the chest, abdomen pelvis in 6 months. He was advised to call immediately if he has any concerning symptoms in the interval. The  patient voices understanding of current disease status and treatment options and is in agreement with the current care plan. All questions were answered. The patient knows to call the clinic with any problems, questions or concerns. We can certainly see the patient much sooner if necessary.   Disclaimer: This note was dictated with voice recognition software. Similar sounding words can inadvertently be transcribed and may not be corrected upon review.

## 2020-06-30 ENCOUNTER — Telehealth: Payer: Self-pay | Admitting: Internal Medicine

## 2020-06-30 NOTE — Telephone Encounter (Signed)
Scheduled per los. Called and left msg. Mailed printout  °

## 2020-07-07 DIAGNOSIS — R972 Elevated prostate specific antigen [PSA]: Secondary | ICD-10-CM | POA: Diagnosis not present

## 2020-07-07 DIAGNOSIS — R8290 Unspecified abnormal findings in urine: Secondary | ICD-10-CM | POA: Diagnosis not present

## 2020-07-21 DIAGNOSIS — R338 Other retention of urine: Secondary | ICD-10-CM | POA: Diagnosis not present

## 2020-07-21 DIAGNOSIS — R972 Elevated prostate specific antigen [PSA]: Secondary | ICD-10-CM | POA: Diagnosis not present

## 2020-07-26 ENCOUNTER — Telehealth: Payer: Self-pay | Admitting: Interventional Cardiology

## 2020-07-26 NOTE — Telephone Encounter (Signed)
Pt c/o BP issue: STAT if pt c/o blurred vision, one-sided weakness or slurred speech  1. What are your last 5 BP readings? 107/63 HR86 earlier this morning,  2. Are you having any other symptoms (ex. Dizziness, headache, blurred vision, passed out)? No, lightheaded Only  3. What is your BP issue? Pt recently last Thursday had a biopsy and has lost a lot of blood, pt's BP has been low since 07/25/20   110/56 HR 109 Currently @ 12:49pm  111/69 HR 51 @today  12:50pm 113/86 HR 90 today @12 :51pm

## 2020-07-26 NOTE — Telephone Encounter (Signed)
Called patient in regards to low BP.  He expresses that he had a prostate biopsy on Thursday 07/21/20.  He states that he lost a lot of blood during the procedure.   He reports that he has not had any bleeding since Monday morning.   He currently has a slight HA, feels tired, and light headed.  BP are as reported during call intake.  Pt continues to check BP continuously I advised him not to.  However, he has checked his BP 3 times while on the phone with me.  BP readings in 778'E systolic and 42'P-53'I diastolic.  Per patient he has not taken losartan since Saturday or Sunday. He reports drinking Gatorade since yesterday.  I advised him to continue to drink plenty of fluids and to call provider that performed procedure to inform them of what is going on.  He reports he called the office once and they were not concerned.  I again advised him to call the provider back and notify of bleeding that has occurred from Thursday until Monday and symptoms that he reports to me.  Pt is agreeable to plan.

## 2020-07-28 DIAGNOSIS — N138 Other obstructive and reflux uropathy: Secondary | ICD-10-CM | POA: Diagnosis not present

## 2020-07-28 DIAGNOSIS — N401 Enlarged prostate with lower urinary tract symptoms: Secondary | ICD-10-CM | POA: Diagnosis not present

## 2020-08-01 ENCOUNTER — Other Ambulatory Visit: Payer: Self-pay | Admitting: Emergency Medicine

## 2020-08-01 DIAGNOSIS — G4700 Insomnia, unspecified: Secondary | ICD-10-CM

## 2020-08-10 ENCOUNTER — Encounter: Payer: Self-pay | Admitting: Physician Assistant

## 2020-08-10 ENCOUNTER — Ambulatory Visit: Payer: Medicare Other | Admitting: Physician Assistant

## 2020-08-10 ENCOUNTER — Other Ambulatory Visit: Payer: Self-pay

## 2020-08-10 VITALS — BP 120/72 | HR 72 | Ht 67.0 in | Wt 151.4 lb

## 2020-08-10 DIAGNOSIS — I2510 Atherosclerotic heart disease of native coronary artery without angina pectoris: Secondary | ICD-10-CM | POA: Diagnosis not present

## 2020-08-10 DIAGNOSIS — I255 Ischemic cardiomyopathy: Secondary | ICD-10-CM | POA: Diagnosis not present

## 2020-08-10 DIAGNOSIS — E782 Mixed hyperlipidemia: Secondary | ICD-10-CM

## 2020-08-10 DIAGNOSIS — I10 Essential (primary) hypertension: Secondary | ICD-10-CM | POA: Diagnosis not present

## 2020-08-10 LAB — CBC
Hematocrit: 28.7 % — ABNORMAL LOW (ref 37.5–51.0)
Hemoglobin: 9.3 g/dL — ABNORMAL LOW (ref 13.0–17.7)
MCH: 27 pg (ref 26.6–33.0)
MCHC: 32.4 g/dL (ref 31.5–35.7)
MCV: 83 fL (ref 79–97)
Platelets: 474 10*3/uL — ABNORMAL HIGH (ref 150–450)
RBC: 3.45 x10E6/uL — ABNORMAL LOW (ref 4.14–5.80)
RDW: 14.7 % (ref 11.6–15.4)
WBC: 5.4 10*3/uL (ref 3.4–10.8)

## 2020-08-10 LAB — BASIC METABOLIC PANEL
BUN/Creatinine Ratio: 15 (ref 10–24)
BUN: 21 mg/dL (ref 8–27)
CO2: 20 mmol/L (ref 20–29)
Calcium: 9.7 mg/dL (ref 8.6–10.2)
Chloride: 102 mmol/L (ref 96–106)
Creatinine, Ser: 1.36 mg/dL — ABNORMAL HIGH (ref 0.76–1.27)
Glucose: 84 mg/dL (ref 65–99)
Potassium: 4.9 mmol/L (ref 3.5–5.2)
Sodium: 140 mmol/L (ref 134–144)
eGFR: 53 mL/min/{1.73_m2} — ABNORMAL LOW (ref >59)

## 2020-08-10 LAB — LIPID PANEL
Chol/HDL Ratio: 2.5 ratio (ref 0.0–5.0)
Cholesterol, Total: 162 mg/dL (ref 100–199)
HDL: 65 mg/dL (ref >39)
LDL Chol Calc (NIH): 86 mg/dL (ref 0–99)
Triglycerides: 56 mg/dL (ref 0–149)
VLDL Cholesterol Cal: 11 mg/dL (ref 5–40)

## 2020-08-10 NOTE — Progress Notes (Signed)
Cardiology Office Note:    Date:  08/10/2020   ID:  Eric [name], DOB [birth year], MRN [id number]  PCP:  Eric [name], [place] Prov[facility] Cardiologist:  Eric Grooms, MD     Referring MD: Eric [name], *   Chief Complaint:  Hypotension    Patient Profile:    Eric [name] is a 80 y.o. male with:   Coronary artery disease  Inf STEMI 2013>>s/p DES x 2 to RCA  Hyporesponder to clopidogrel>> Prasugrel  Ischemic CM w/ EF 45-50 at time of MI in 2013  ETT 2016: low risk   Hypertension  Hyperlipidemia  GERD  NHL (Non-Hodgkin Lymphoma) s/p chemotherapy   Dr. Julien [name]  BPH  Prior CV studies: Exercise tolerance test 26-Sep-2014 Low risk  Cardiac catheterization 03/03/2012 LM heavily calcified LAD heavily calcified mid to distal 50; D1 ostial/proximal 95 LCx Minor luminal irregularities RCA proximal 99 with left to right collaterals EF 45-50 PCI: 3 x 38, 3 x 28 mm Promus Premier DES to the RCA  History of Present Illness: Eric [name] was last seen by Eric Copa, PA-C via Telemedicine in 11/21.  Pt had a recent prostate Bx on 07/21/20 (notes in Care Everywhere indicate benign findings).  Pt called in to our office with reports of significant blood loss in the procedure and low BPs.  He was having some lightheadedness.  The RN advised him to push fluids.  The pt had held his Losartan.  He was advised to call his urologist.     He is here alone today.  He notes that he had issues with bleeding after his procedure.  He has had some low blood pressure since then.  He has seen systolic readings close to 659 and diastolic readings around 50 at times.  He has not really had any symptoms.  He has not had chest pain, shortness of breath, syncope or near syncope, orthopnea or leg edema.  His blood pressure seems to have increased since that time.  He has not had any further bleeding.        Past Medical History:  Diagnosis Date  . Cancer ([place])   .  Coronary atherosclerosis of native coronary artery    a. s/p MI with DES/PCI to the RCA in 2013.  . Encounter for antineoplastic chemotherapy 06/20/2015  . Essential hypertension   . GERD (gastroesophageal reflux disease)   . History of heart attack   . Hyperlipidemia   . Ischemic cardiomyopathy   . Myocardial infarction ([place])   . Non Hodgkin's lymphoma ([place])   . Plavix resistance    Plavix hyporesponder 2013  . Sinus bradycardia    baseline HR 50s at times    Current Medications: Current Meds  Medication Sig  . aspirin EC 81 MG tablet Take 81 mg by mouth at bedtime.  Marland Kitchen atorvastatin (LIPITOR) 40 MG tablet TAKE 1 TABLET BY MOUTH DAILY AT 6 PM  . diphenhydrAMINE (BENADRYL) 25 MG tablet Take 25 mg by mouth at bedtime as needed for sleep.   Marland Kitchen losartan (COZAAR) 100 MG tablet TAKE 1 TABLET BY MOUTH EVERY DAY  . omeprazole (PRILOSEC) 20 MG capsule Take 20 mg by mouth daily as needed (acid relfux).  . Polyethylene Glycol 3350 (PEG 3350) POWD Take by mouth.  . sildenafil (VIAGRA) 100 MG tablet Take 100 mg by mouth daily as needed.  . traZODone (DESYREL) 100 MG tablet TAKE 1 TABLET BY MOUTH EVERY NIGHT AT BEDTIME AS NEEDED FOR FOR  SLEEP     Allergies:   Neosporin [neomycin-bacitracin zn-polymyx]   Social History   Tobacco Use  . Smoking status: Never Smoker  . Smokeless tobacco: Never Used  Vaping Use  . Vaping Use: Never used  Substance Use Topics  . Alcohol use: Yes    Comment: occasionally  . Drug use: No     Family Hx: The patient's family history includes Leukemia in his mother.  Review of Systems  Genitourinary: Positive for hematuria (resolved).     EKGs/Labs/Other Test Reviewed:    EKG:  EKG is   ordered today.  The ekg ordered today demonstrates NSR, HR 65, normal axis, no ST-T wave changes, QTC 430  Recent Labs: 02/04/2020: TSH 0.688 06/29/2020: ALT 12 08/10/2020: BUN 21; Creatinine, Ser 1.36; Hemoglobin 9.3; Platelets 474; Potassium 4.9; Sodium 140   Recent  Lipid Panel Lab Results  Component Value Date/Time   CHOL 162 08/10/2020 12:08 PM   TRIG 56 08/10/2020 12:08 PM   HDL 65 08/10/2020 12:08 PM   LDLCALC 86 08/10/2020 12:08 PM      Risk Assessment/Calculations:      Physical Exam:    VS:  BP 120/72   Pulse 72   Ht 5\' 7"  (1.702 m)   Wt 151 lb 6.4 oz (68.7 kg)   SpO2 99%   BMI 23.71 kg/m     Wt Readings from Last 3 Encounters:  08/10/20 151 lb 6.4 oz (68.7 kg)  06/29/20 139 lb 11.2 oz (63.4 kg)  05/02/20 150 lb (68 kg)     Constitutional:      Appearance: Healthy appearance. Not in distress.  Neck:     Vascular: JVD normal.  Pulmonary:     Effort: Pulmonary effort is normal.     Breath sounds: No wheezing. No rales.  Cardiovascular:     Normal rate. Regular rhythm. Normal S1. Normal S2.     Murmurs: There is no murmur.  Edema:    Peripheral edema absent.  Abdominal:     Palpations: Abdomen is soft.  Skin:    General: Skin is warm and dry.  Neurological:     General: No focal deficit present.     Mental Status: Alert and oriented to person, place and time.     Cranial Nerves: Cranial nerves are intact.         ASSESSMENT & PLAN:    1. Essential hypertension I suspect he had fluid loss as well as bleeding that contributed to his low blood pressure post prostate biopsy.  This should gradually improve over time.  I repeated his blood pressure today.  It was 128/68 on the left and 126/68 on the right.  He has not really had any symptoms with his low blood pressures.  Since he did have some bleeding after his procedure, I have recommended that we obtain a BMET and CBC today.  I have asked him to continue to push fluids.  He should hold off on taking losartan for now.  He should continue to monitor his blood pressures.  Once his systolic is consistently >081, he can resume losartan.  2. Coronary artery disease involving native coronary artery of native heart without angina pectoris History of inferior STEMI in 2013  treated with a DES x2 to the RCA.  He is doing well without anginal symptoms.  Electrocardiogram demonstrates no significant changes.  Continue aspirin, atorvastatin.  3. Mixed hyperlipidemia Continue high intensity statin therapy.  He is fasting today would like to have  his lipids rechecked.  Obtain fasting lipid panel         Dispo:  Return in about 6 months (around 02/09/2021) for Routine follow up in 6 months for Dr. Tamala Julian. .   Medication Adjustments/Labs and Tests Ordered: Current medicines are reviewed at length with the patient today.  Concerns regarding medicines are outlined above.  Tests Ordered: Orders Placed This Encounter  Procedures  . Basic Metabolic Panel (BMET)  . CBC  . Lipid Profile   Medication Changes: No orders of the defined types were placed in this encounter.   Signed, Richardson Dopp, PA-C  08/10/2020 [facility] [place], [place],   [address] Phone: [phone]; Fax: [phone]

## 2020-08-10 NOTE — Patient Instructions (Signed)
Medication Instructions:  Your physician has recommended you make the following change in your medication:   HOLD OFF ON LOSARTAN TILL YOUR BLOOD PRESSURE REACHES 540 SYSTOLIC ON TOP NUMBER BEFORE STARTING BACK ON LOSARTAN.   *If you need a refill on your cardiac medications before your next appointment, please call your pharmacy*   Lab Work: TODAY:  BMET/CBC/LIPID  If you have labs (blood work) drawn today and your tests are completely normal, you will receive your results only by: Marland Kitchen MyChart Message (if you have MyChart) OR . A paper copy in the mail If you have any lab test that is abnormal or we need to change your treatment, we will call you to review the results.   Testing/Procedures: NONE   Follow-Up: At [facility], you and your health needs are our priority.  As part of our continuing mission to provide you with exceptional heart care, we have created designated Provider Care Teams.  These Care Teams include your primary Cardiologist (physician) and Advanced Practice Providers (APPs -  Physician Assistants and Nurse Practitioners) who all work together to provide you with the care you need, when you need it.  We recommend signing up for the patient portal called "MyChart".  Sign up information is provided on this After Visit Summary.  MyChart is used to connect with patients for Virtual Visits (Telemedicine).  Patients are able to view lab/test results, encounter notes, upcoming appointments, etc.  Non-urgent messages can be sent to your provider as well.   To learn more about what you can do with MyChart, go to [email or web address].    Your next appointment:   6 month(s)  The format for your next appointment:   In Person  Provider:   Daneen Schick, MD   Other Instructions

## 2020-08-11 MED ORDER — ATORVASTATIN CALCIUM 40 MG PO TABS: ORAL_TABLET | ORAL | 3 refills | Status: DC

## 2020-08-11 NOTE — Addendum Note (Signed)
Addended by: Willeen Cass on: 08/11/2020 09:12 AM   Modules accepted: Orders

## 2020-08-11 NOTE — Progress Notes (Signed)
Spoke with patient about results of lab work and recommendations from [employer]  Increase lipitor to 80mg  daily, continue to hold losartan, start taking OTC ferrous sulfate 325 mg daily.  Patient will come to the lab in one week for a CBC and BMET.  Patient verbally acknowledged changes.    Also advised to drink plenty of water and follow up with his PCP about his anemia.

## 2020-08-17 ENCOUNTER — Telehealth: Payer: Self-pay | Admitting: Physician Assistant

## 2020-08-17 ENCOUNTER — Other Ambulatory Visit: Payer: Self-pay

## 2020-08-17 DIAGNOSIS — E782 Mixed hyperlipidemia: Secondary | ICD-10-CM

## 2020-08-17 MED ORDER — ATORVASTATIN CALCIUM 80 MG PO TABS: 80.0000 mg | ORAL_TABLET | Freq: Every day | ORAL | 3 refills | Status: DC

## 2020-08-17 NOTE — Telephone Encounter (Signed)
Pt's medication has already been sent to pt's pharmacy as requested. Confirmation received.  

## 2020-08-17 NOTE — Telephone Encounter (Signed)
*  STAT* If patient is at the pharmacy, call can be transferred to refill team.   1. Which medications need to be refilled? (please list name of each medication and dose if known)  atorvastatin (LIPITOR) 80 MG tablet  2. Which pharmacy/location (including street and city if local pharmacy) is medication to be sent to? [id number] - [place], [place] - [address]  3. Do they need a 30 day or 90 day supply? 90  Patient states the way the initial rx was written (take 2-40 mg tablets) his insurance would not cover it because there is an 80 mg tablet available. The patient needs an rx with updated dispensing instructions sent for the patient to take one 80 mg tablet

## 2020-08-19 ENCOUNTER — Other Ambulatory Visit: Payer: Self-pay

## 2020-08-19 ENCOUNTER — Other Ambulatory Visit (HOSPITAL_COMMUNITY): Payer: Self-pay

## 2020-08-19 ENCOUNTER — Other Ambulatory Visit: Payer: Medicare Other | Admitting: *Deleted

## 2020-08-19 DIAGNOSIS — I10 Essential (primary) hypertension: Secondary | ICD-10-CM | POA: Diagnosis not present

## 2020-08-19 DIAGNOSIS — E782 Mixed hyperlipidemia: Secondary | ICD-10-CM | POA: Diagnosis not present

## 2020-08-19 LAB — CBC
Hematocrit: 28 % — ABNORMAL LOW (ref 37.5–51.0)
Hemoglobin: 8.6 g/dL — ABNORMAL LOW (ref 13.0–17.7)
MCH: 25.6 pg — ABNORMAL LOW (ref 26.6–33.0)
MCHC: 30.7 g/dL — ABNORMAL LOW (ref 31.5–35.7)
MCV: 83 fL (ref 79–97)
Platelets: 356 10*3/uL (ref 150–450)
RBC: 3.36 x10E6/uL — ABNORMAL LOW (ref 4.14–5.80)
RDW: 14.4 % (ref 11.6–15.4)
WBC: 3.7 10*3/uL (ref 3.4–10.8)

## 2020-08-19 LAB — HEPATIC FUNCTION PANEL
ALT: 11 IU/L (ref 0–44)
AST: 17 IU/L (ref 0–40)
Albumin: 4.1 g/dL (ref 3.7–4.7)
Alkaline Phosphatase: 123 IU/L — ABNORMAL HIGH (ref 44–121)
Bilirubin Total: 0.2 mg/dL (ref 0.0–1.2)
Bilirubin, Direct: 0.1 mg/dL (ref 0.00–0.40)
Total Protein: 5.6 g/dL — ABNORMAL LOW (ref 6.0–8.5)

## 2020-08-19 LAB — BASIC METABOLIC PANEL
BUN/Creatinine Ratio: 17 (ref 10–24)
BUN: 20 mg/dL (ref 8–27)
CO2: 19 mmol/L — ABNORMAL LOW (ref 20–29)
Calcium: 9 mg/dL (ref 8.6–10.2)
Chloride: 107 mmol/L — ABNORMAL HIGH (ref 96–106)
Creatinine, Ser: 1.21 mg/dL (ref 0.76–1.27)
Glucose: 90 mg/dL (ref 65–99)
Potassium: 4.2 mmol/L (ref 3.5–5.2)
Sodium: 141 mmol/L (ref 134–144)
eGFR: 61 mL/min/{1.73_m2} (ref >59)

## 2020-08-19 LAB — LIPID PANEL
Chol/HDL Ratio: 2.3 ratio (ref 0.0–5.0)
Cholesterol, Total: 123 mg/dL (ref 100–199)
HDL: 53 mg/dL (ref >39)
LDL Chol Calc (NIH): 61 mg/dL (ref 0–99)
Triglycerides: 35 mg/dL (ref 0–149)
VLDL Cholesterol Cal: 9 mg/dL (ref 5–40)

## 2020-08-23 ENCOUNTER — Other Ambulatory Visit: Payer: Self-pay | Admitting: *Deleted

## 2020-08-23 ENCOUNTER — Telehealth: Payer: Self-pay | Admitting: Physician Assistant

## 2020-08-23 DIAGNOSIS — E782 Mixed hyperlipidemia: Secondary | ICD-10-CM

## 2020-08-23 MED ORDER — FERROUS SULFATE 325 (65 FE) MG PO TABS: 325.0000 mg | ORAL_TABLET | Freq: Two times a day (BID) | ORAL | 3 refills | Status: AC

## 2020-08-23 NOTE — Telephone Encounter (Signed)
Eric [name] is calling stating he is returning a phone call he received this morning. He stated he believed it to be from [employer]' nurse in regards to his lab results, but I was unable to reach his nurse at the time of the call. Please advise.

## 2020-08-23 NOTE — Telephone Encounter (Signed)
Reviewed results and provider recommendations.  Scheduled for 3 month fu lab work.  Informed him to call in if his BP is consistently 130/80 or above.  He reports that he checks his BP daily.  I also told him if he has active bleeding to go to the ED.  He verbalizes understanding. All questions were answered.

## 2020-08-23 NOTE — Telephone Encounter (Signed)
-----   Message from Liliane Shi, [place] sent at 08/22/2020 12:32 PM EDT ----- LFTs and Lipids were supposed to be done 3 months from now (med changes were made on 6/8 labs).   Creatinine is back to normal.  K+ is normal.  Hgb is somewhat lower.   PLAN:  - Please arrange f/u with PCP to evaluate anemia (it looks like he has a physical next week)   - Send labs to PCP. - Credit fasting Lipids and LFTs and reschedule for 3 mos from now as planned. - Continue current medications. - Call if blood pressure > 130/80 consistently - If he is having any active bleeding (hematuria, hematochezia, hemoptysis, hematemesis) - he should go to the ED Richardson Dopp, PA-C    08/22/2020 12:16 PM

## 2020-08-25 DIAGNOSIS — Z4803 Encounter for change or removal of drains: Secondary | ICD-10-CM | POA: Diagnosis not present

## 2020-08-30 NOTE — Addendum Note (Signed)
Addended by: Mendel Ryder on: 08/30/2020 09:08 AM   Modules accepted: Orders

## 2020-08-31 ENCOUNTER — Encounter: Payer: Medicare Other | Admitting: Emergency Medicine

## 2020-09-15 DIAGNOSIS — R972 Elevated prostate specific antigen [PSA]: Secondary | ICD-10-CM | POA: Diagnosis not present

## 2020-09-15 DIAGNOSIS — R339 Retention of urine, unspecified: Secondary | ICD-10-CM | POA: Diagnosis not present

## 2020-09-15 DIAGNOSIS — N5201 Erectile dysfunction due to arterial insufficiency: Secondary | ICD-10-CM | POA: Diagnosis not present

## 2020-09-15 DIAGNOSIS — N401 Enlarged prostate with lower urinary tract symptoms: Secondary | ICD-10-CM | POA: Diagnosis not present

## 2020-10-14 IMAGING — CT CT CHEST W/ CM
3 of 5 series · 14 of 36 positions shown, 17 images · IV contrast (OMNIPAQUE)
Comparison: 06/23/2018

CLINICAL DATA: Non-Hodgkin's lymphoma restaging

EXAM:
CT CHEST, ABDOMEN, AND PELVIS WITH CONTRAST
TECHNIQUE: Multidetector CT imaging of the chest, abdomen and pelvis was
performed following the standard protocol during bolus
administration of intravenous contrast.
CONTRAST:  100mL OMNIPAQUE IOHEXOL 300 MG/ML  SOLN

[Series 2: cap with · axial · 0.71mm/px · z∈[-594,-99]mm · 9 of 125 slices shown, 12 images]
[im 13/125  mediastinal]
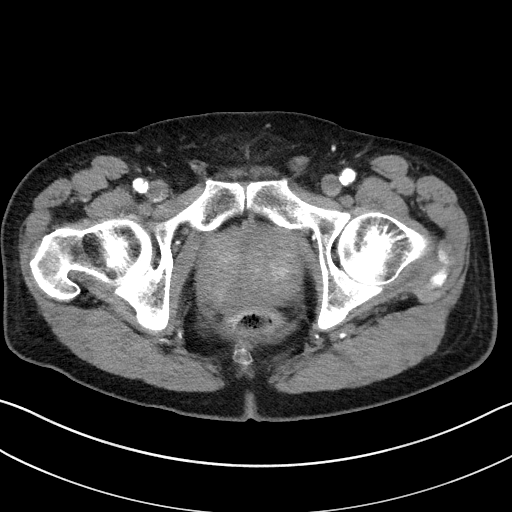
[im 13/125  lung]
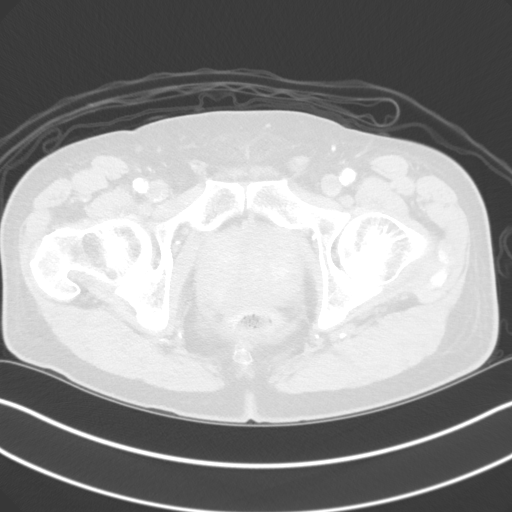
[im 25/125  lung]
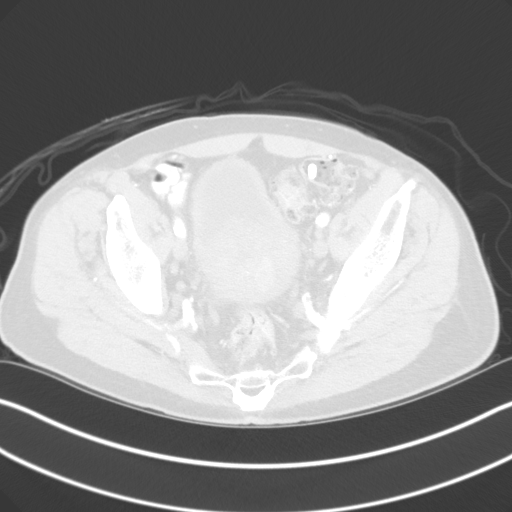
[im 38/125  lung]
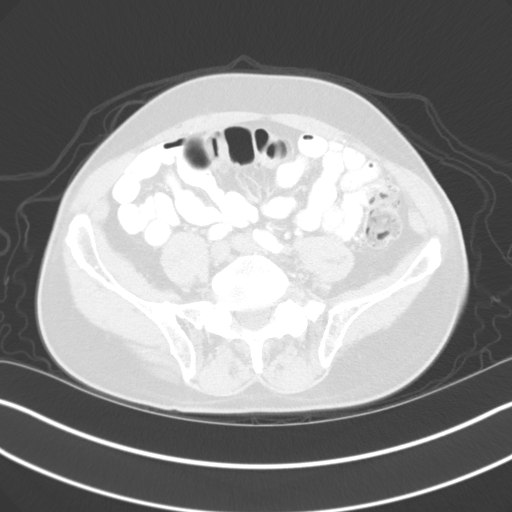
[im 50/125  lung]
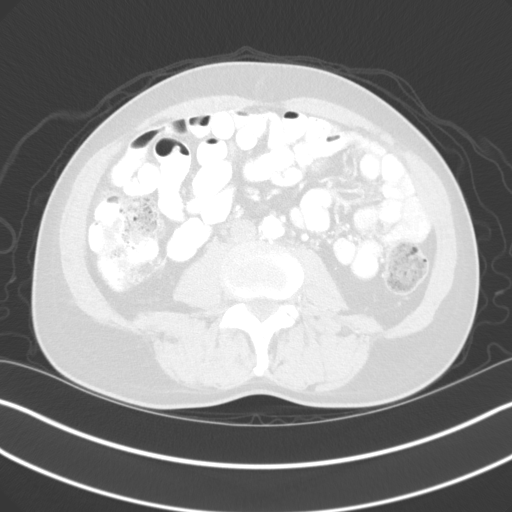
[im 63/125  mediastinal]
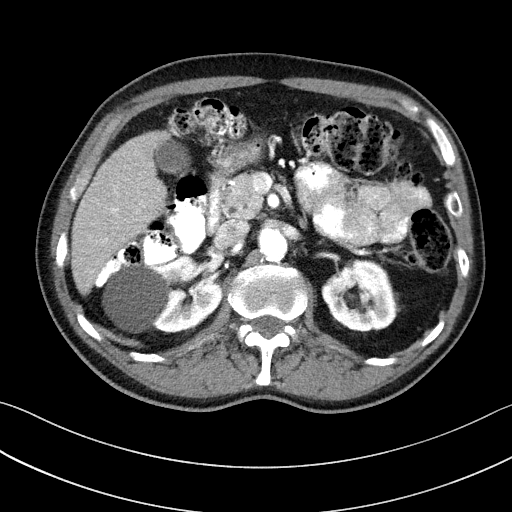
[im 63/125  lung]
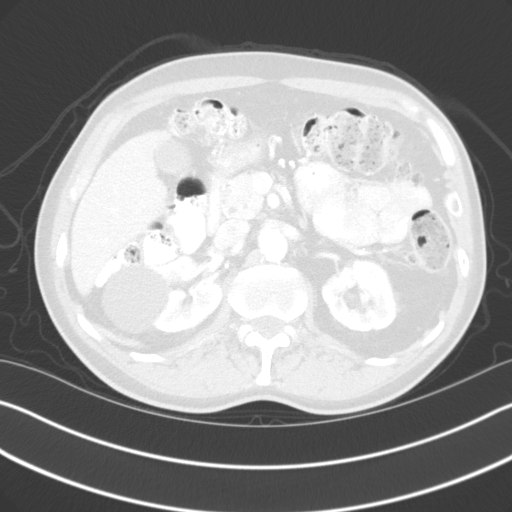
[im 75/125  lung]
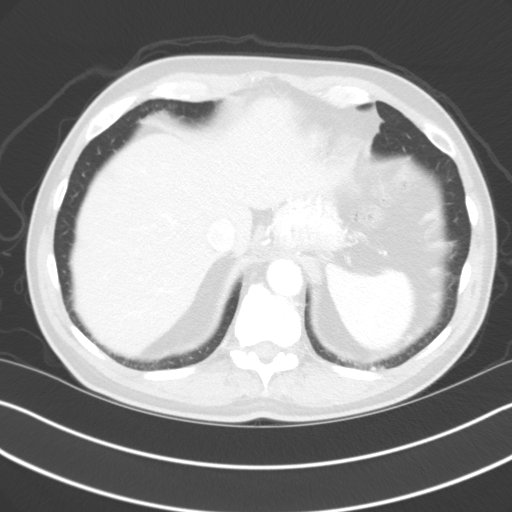
[im 87/125  lung]
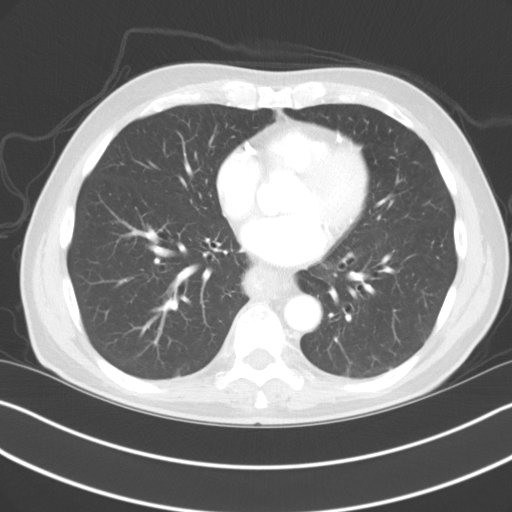
[im 100/125  lung]
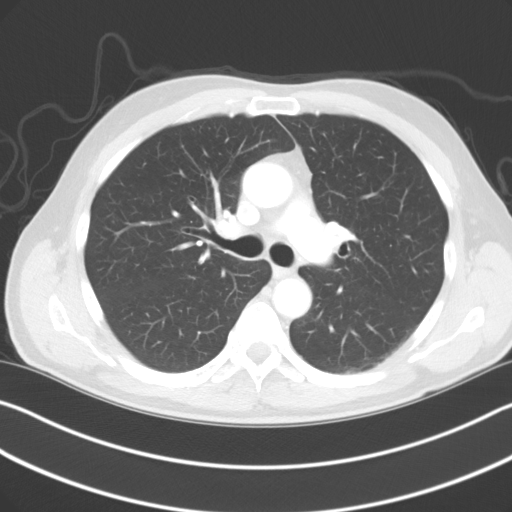
[im 112/125  mediastinal]
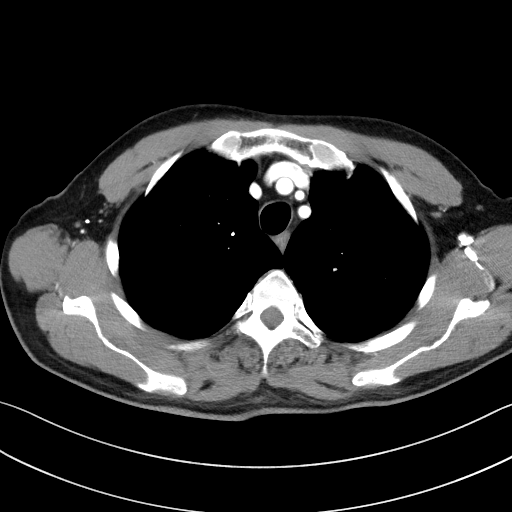
[im 112/125  lung]
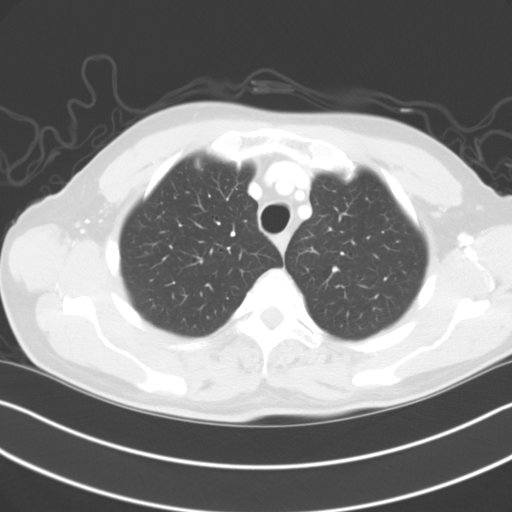

[Series 4: coronals · coronal · 0.80mm/px · 3 of 140 slices shown]
[im 28/140  lung]
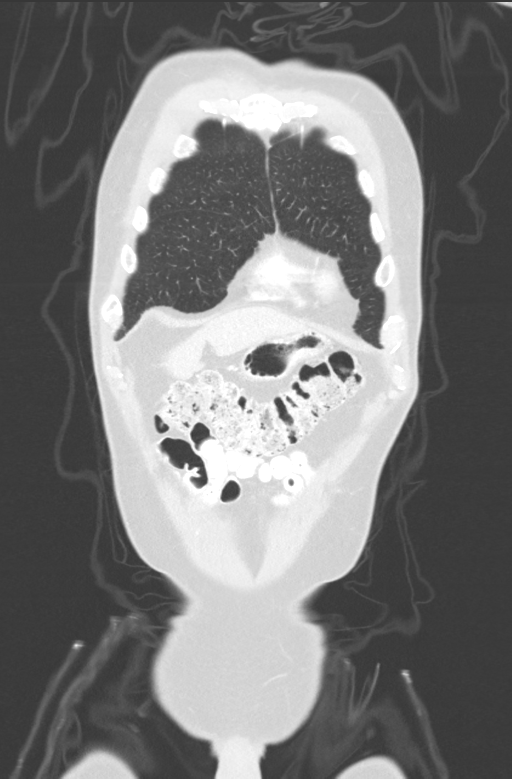
[im 56/140  lung]
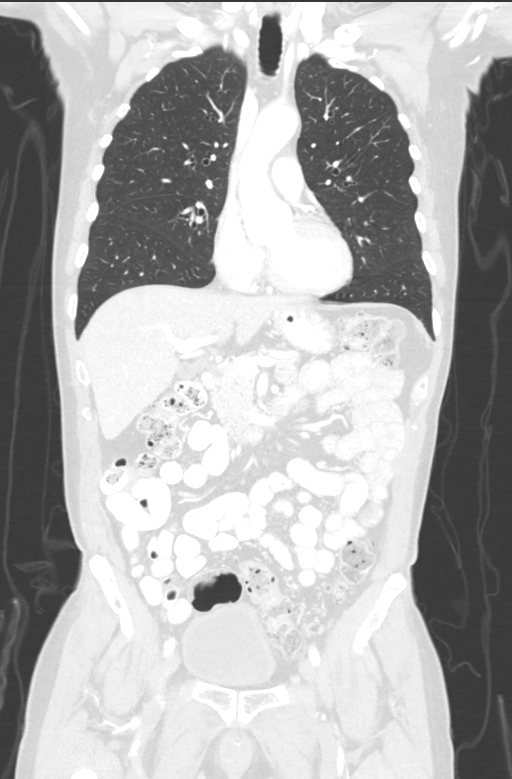
[im 84/140  lung]
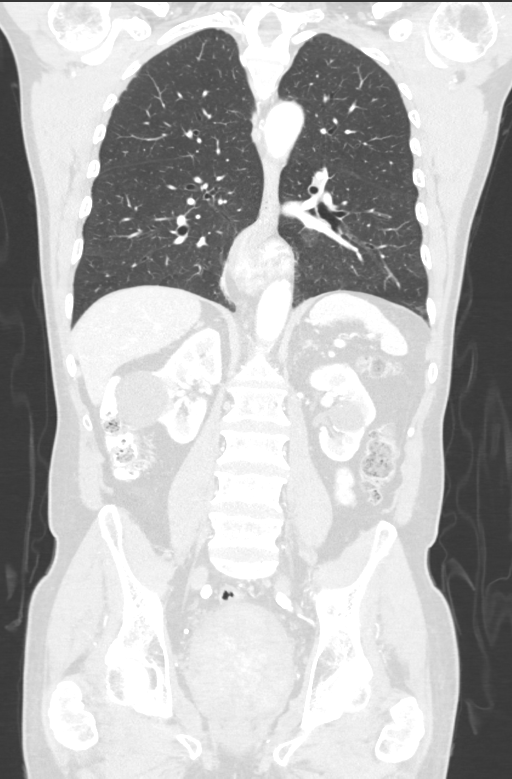

[Series 6: lung · axial · 0.71mm/px · z∈[-310,-264]mm · 2 of 150 slices shown]
[im 12/150  lung]
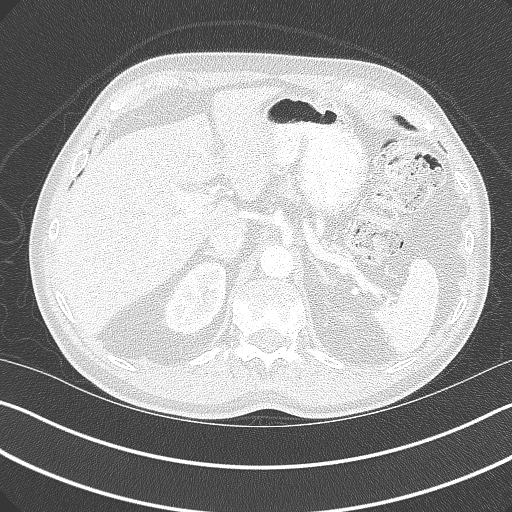
[im 35/150  lung]
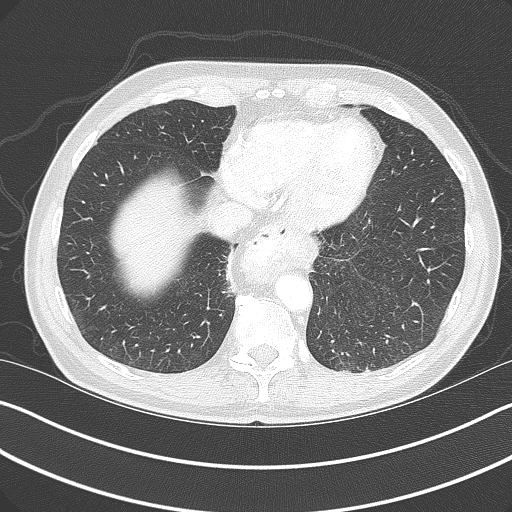

[14 of 36 positions shown; findings below may reference images not displayed]

FINDINGS: CT CHEST FINDINGS

Cardiovascular: Coronary, aortic arch, and branch vessel
atherosclerotic vascular disease.

Mediastinum/Nodes: Moderate-sized hiatal hernia. Distal esophageal
wall thickening, probably from esophagitis. Right axillary clips
from prior node dissection. No pathologic adenopathy in the chest.

Lungs/Pleura: Unremarkable

Musculoskeletal: Stable small sclerotic lesion anterolaterally in
the right seventh rib, no change from that described on 02/06/2008,
previously not hypermetabolic on PET-CT examinations. Considered
benign.

CT ABDOMEN PELVIS FINDINGS

Hepatobiliary: Unremarkable

Pancreas: Unremarkable

Spleen: Unremarkable

Adrenals/Urinary Tract: Adrenal glands normal. Stable renal cysts.
Massively enlarged prostate gland indents the bladder base.

Stomach/Bowel: Moderate-sized hiatal hernia. Prominent stool
throughout the colon favors constipation. Sigmoid colon
diverticulosis with scattered diverticula of the descending colon.

Vascular/Lymphatic: Aortoiliac atherosclerotic vascular disease.

Left paracentral mesenteric lymph node 0.7 cm in short axis on image
67/2, previously 2.5 cm. Aortocaval node 0.3 cm in short axis on
image 64/2, previously 1.1 cm. No new adenopathy.

Reproductive: The prostate gland measures 8.5 by 7.1 by 10.8 cm
(volume = 340 cm^3), markedly enlarged.

Other: No supplemental non-categorized findings.

Musculoskeletal: Small umbilical hernia contains adipose tissue.
Mild lower lumbar spondylosis and degenerative disc disease.
IMPRESSION: 1. Significant further reduction in adenopathy in the mesentery and
retroperitoneum, no currently enlarged lymph nodes identified.
2. Stable massive enlargement the prostate gland, estimated at 340
cubic cm in volume.
3. Moderate-sized hiatal hernia. Distal esophageal wall thickening
is probably from esophagitis.
4.  Aortic Atherosclerosis (KYE2B-9QH.H).  Coronary atherosclerosis.
5.  Prominent stool throughout the colon favors constipation.
6. Sigmoid colon diverticulosis.

## 2020-10-14 IMAGING — CT CT ABD-PELV W/ CM
3 of 5 series · 14 of 36 positions shown, 17 images · IV contrast (OMNIPAQUE)
Comparison: 06/23/2018

CLINICAL DATA: Non-Hodgkin's lymphoma restaging

EXAM:
CT CHEST, ABDOMEN, AND PELVIS WITH CONTRAST
TECHNIQUE: Multidetector CT imaging of the chest, abdomen and pelvis was
performed following the standard protocol during bolus
administration of intravenous contrast.
CONTRAST:  100mL OMNIPAQUE IOHEXOL 300 MG/ML  SOLN

[Series 2: cap with · axial · 0.71mm/px · z∈[-594,-99]mm · 9 of 125 slices shown, 12 images]
[im 13/125  mediastinal]
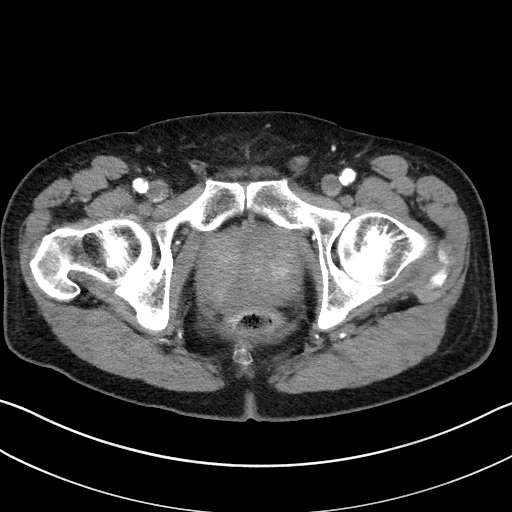
[im 13/125  lung]
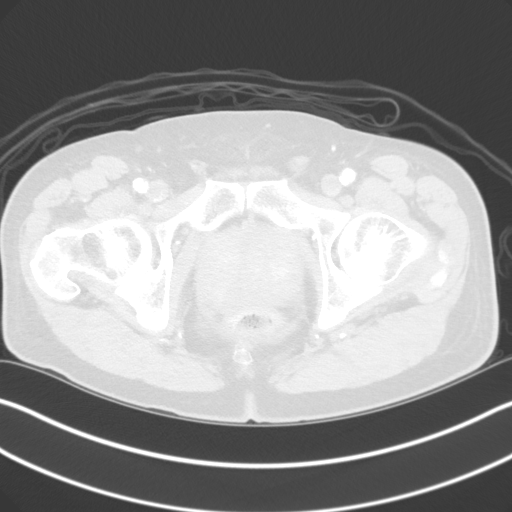
[im 25/125  lung]
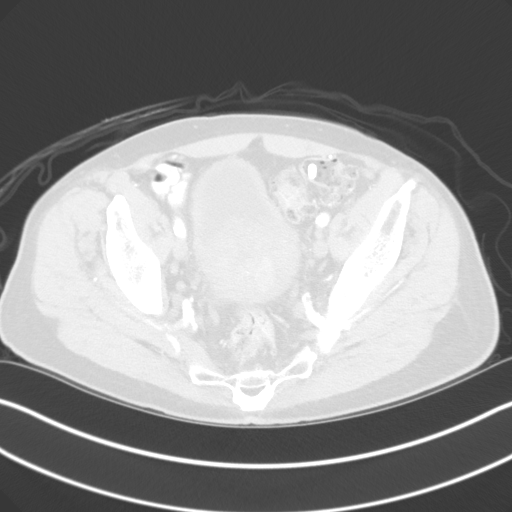
[im 38/125  lung]
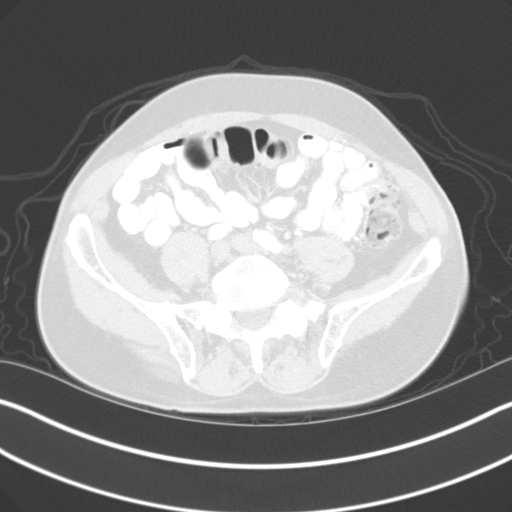
[im 50/125  lung]
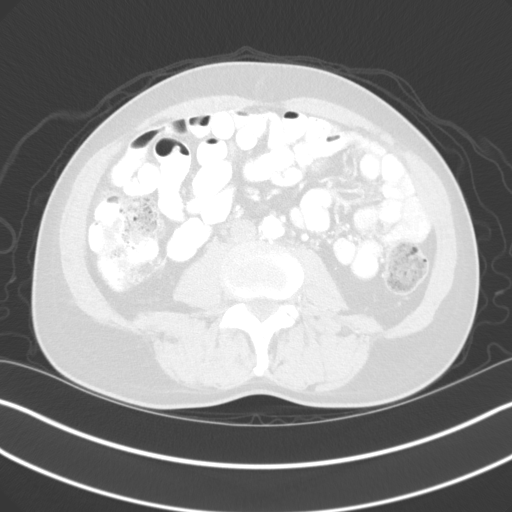
[im 63/125  mediastinal]
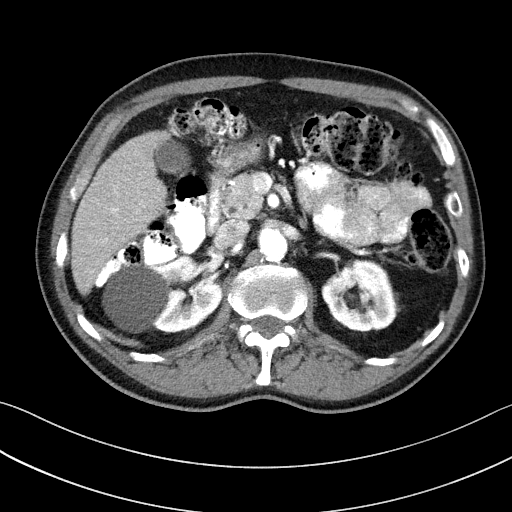
[im 63/125  lung]
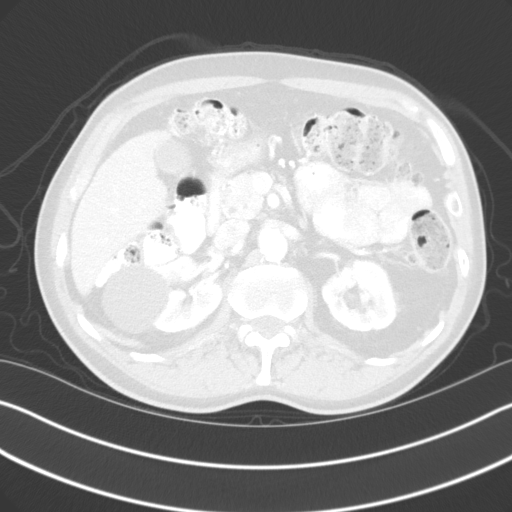
[im 75/125  lung]
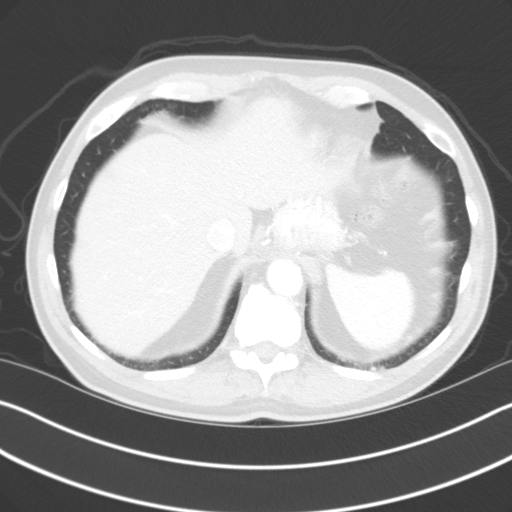
[im 87/125  lung]
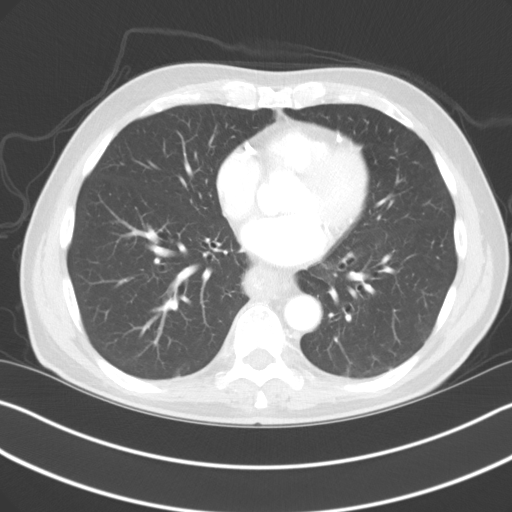
[im 100/125  lung]
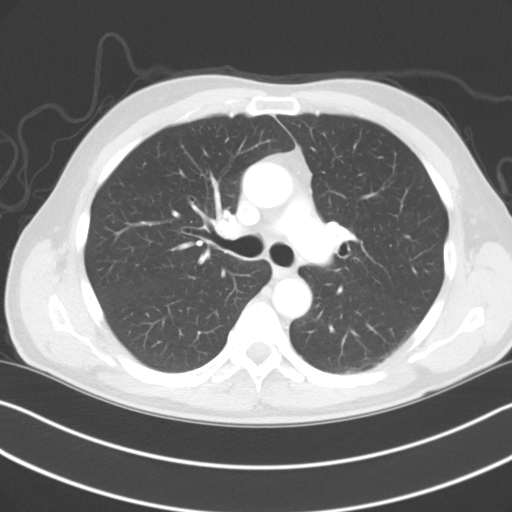
[im 112/125  mediastinal]
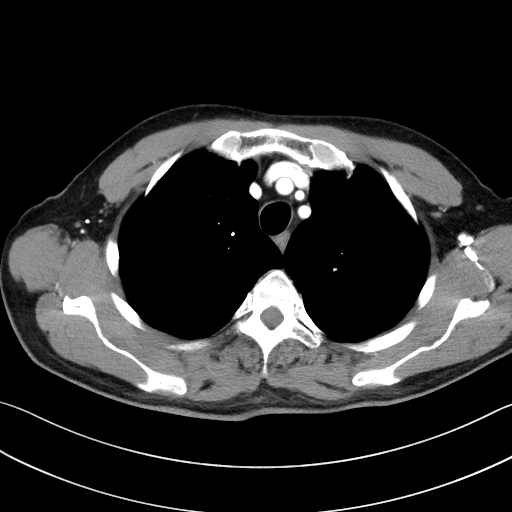
[im 112/125  lung]
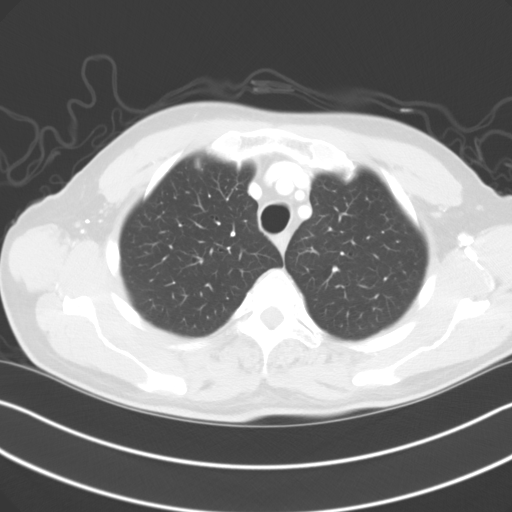

[Series 4: coronals · coronal · 0.80mm/px · 3 of 140 slices shown]
[im 28/140  lung]
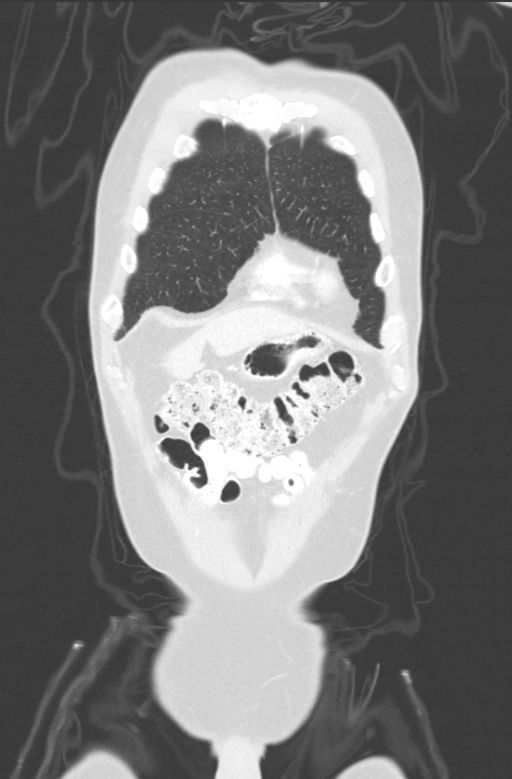
[im 56/140  lung]
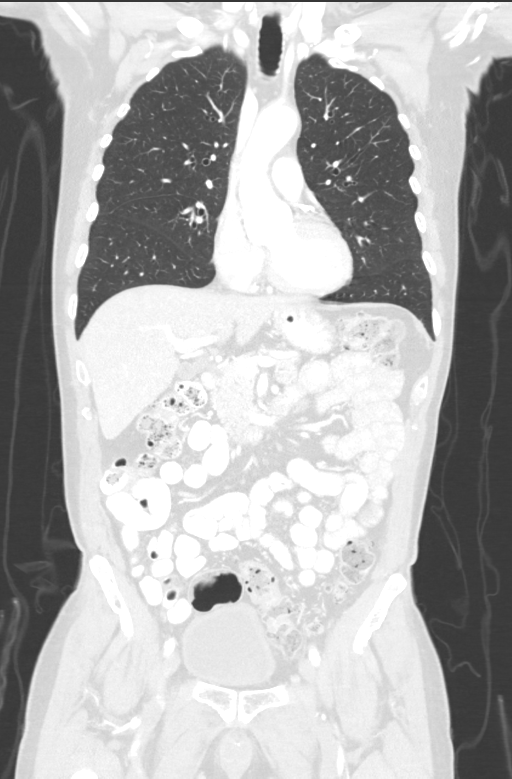
[im 84/140  lung]
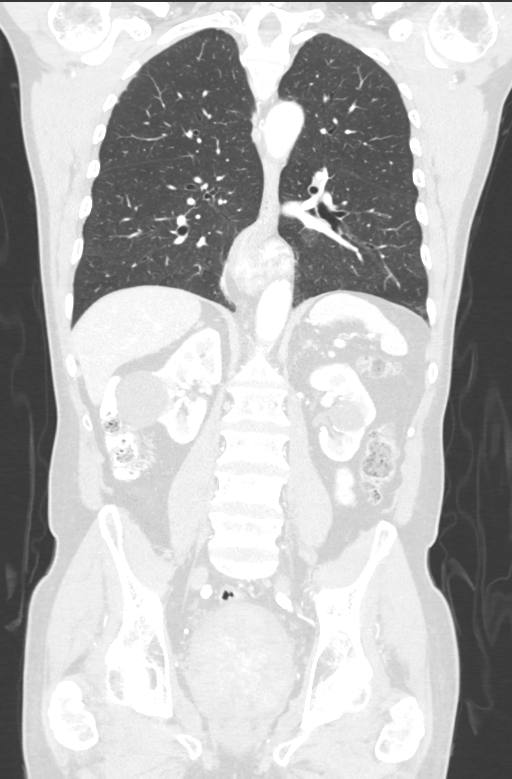

[Series 6: lung · axial · 0.71mm/px · z∈[-310,-264]mm · 2 of 150 slices shown]
[im 12/150  lung]
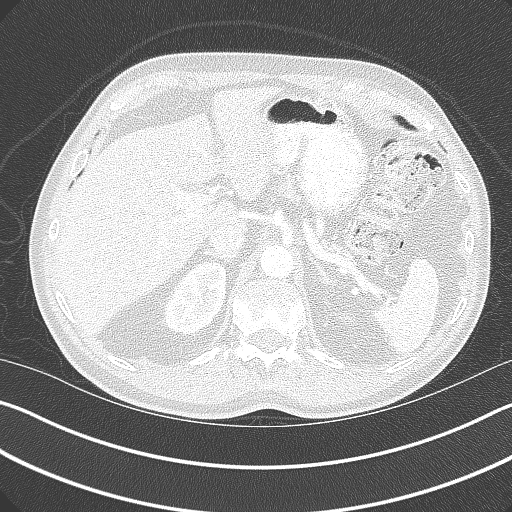
[im 35/150  lung]
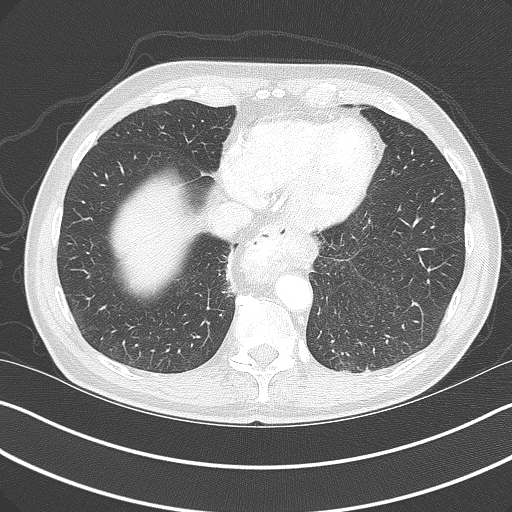

[14 of 36 positions shown; findings below may reference images not displayed]

FINDINGS: CT CHEST FINDINGS

Cardiovascular: Coronary, aortic arch, and branch vessel
atherosclerotic vascular disease.

Mediastinum/Nodes: Moderate-sized hiatal hernia. Distal esophageal
wall thickening, probably from esophagitis. Right axillary clips
from prior node dissection. No pathologic adenopathy in the chest.

Lungs/Pleura: Unremarkable

Musculoskeletal: Stable small sclerotic lesion anterolaterally in
the right seventh rib, no change from that described on 02/06/2008,
previously not hypermetabolic on PET-CT examinations. Considered
benign.

CT ABDOMEN PELVIS FINDINGS

Hepatobiliary: Unremarkable

Pancreas: Unremarkable

Spleen: Unremarkable

Adrenals/Urinary Tract: Adrenal glands normal. Stable renal cysts.
Massively enlarged prostate gland indents the bladder base.

Stomach/Bowel: Moderate-sized hiatal hernia. Prominent stool
throughout the colon favors constipation. Sigmoid colon
diverticulosis with scattered diverticula of the descending colon.

Vascular/Lymphatic: Aortoiliac atherosclerotic vascular disease.

Left paracentral mesenteric lymph node 0.7 cm in short axis on image
67/2, previously 2.5 cm. Aortocaval node 0.3 cm in short axis on
image 64/2, previously 1.1 cm. No new adenopathy.

Reproductive: The prostate gland measures 8.5 by 7.1 by 10.8 cm
(volume = 340 cm^3), markedly enlarged.

Other: No supplemental non-categorized findings.

Musculoskeletal: Small umbilical hernia contains adipose tissue.
Mild lower lumbar spondylosis and degenerative disc disease.
IMPRESSION: 1. Significant further reduction in adenopathy in the mesentery and
retroperitoneum, no currently enlarged lymph nodes identified.
2. Stable massive enlargement the prostate gland, estimated at 340
cubic cm in volume.
3. Moderate-sized hiatal hernia. Distal esophageal wall thickening
is probably from esophagitis.
4.  Aortic Atherosclerosis (KYE2B-9QH.H).  Coronary atherosclerosis.
5.  Prominent stool throughout the colon favors constipation.
6. Sigmoid colon diverticulosis.

## 2020-11-23 ENCOUNTER — Other Ambulatory Visit: Payer: Medicare Other | Admitting: *Deleted

## 2020-11-23 ENCOUNTER — Other Ambulatory Visit: Payer: Self-pay

## 2020-11-23 DIAGNOSIS — E782 Mixed hyperlipidemia: Secondary | ICD-10-CM

## 2020-11-23 LAB — LIPID PANEL
Chol/HDL Ratio: 2.9 ratio (ref 0.0–5.0)
Cholesterol, Total: 140 mg/dL (ref 100–199)
HDL: 49 mg/dL (ref >39)
LDL Chol Calc (NIH): 76 mg/dL (ref 0–99)
Triglycerides: 77 mg/dL (ref 0–149)
VLDL Cholesterol Cal: 15 mg/dL (ref 5–40)

## 2020-11-23 LAB — HEPATIC FUNCTION PANEL
ALT: 18 IU/L (ref 0–44)
AST: 25 IU/L (ref 0–40)
Albumin: 4.4 g/dL (ref 3.7–4.7)
Alkaline Phosphatase: 131 IU/L — ABNORMAL HIGH (ref 44–121)
Bilirubin Total: 0.3 mg/dL (ref 0.0–1.2)
Bilirubin, Direct: 0.11 mg/dL (ref 0.00–0.40)
Total Protein: 6.1 g/dL (ref 6.0–8.5)

## 2020-12-07 DIAGNOSIS — Z4803 Encounter for change or removal of drains: Secondary | ICD-10-CM | POA: Diagnosis not present

## 2020-12-22 ENCOUNTER — Encounter: Payer: Medicare Other | Admitting: Emergency Medicine

## 2020-12-27 ENCOUNTER — Encounter: Payer: Medicare Other | Admitting: Emergency Medicine

## 2020-12-27 ENCOUNTER — Other Ambulatory Visit: Payer: Self-pay

## 2020-12-27 ENCOUNTER — Inpatient Hospital Stay: Payer: Medicare Other

## 2020-12-27 ENCOUNTER — Ambulatory Visit (HOSPITAL_COMMUNITY)
Admission: RE | Admit: 2020-12-27 | Discharge: 2020-12-27 | Disposition: A | Discharge status: Home / Self Care | Payer: Medicare Other | Source: Ambulatory Visit | Attending: Internal Medicine | Admitting: Internal Medicine

## 2020-12-27 ENCOUNTER — Inpatient Hospital Stay: Payer: Medicare Other | Attending: Internal Medicine

## 2020-12-27 DIAGNOSIS — I2510 Atherosclerotic heart disease of native coronary artery without angina pectoris: Secondary | ICD-10-CM | POA: Diagnosis not present

## 2020-12-27 DIAGNOSIS — Z9221 Personal history of antineoplastic chemotherapy: Secondary | ICD-10-CM | POA: Diagnosis not present

## 2020-12-27 DIAGNOSIS — I700 Atherosclerosis of aorta: Secondary | ICD-10-CM | POA: Diagnosis not present

## 2020-12-27 DIAGNOSIS — D500 Iron deficiency anemia secondary to blood loss (chronic): Secondary | ICD-10-CM | POA: Insufficient documentation

## 2020-12-27 DIAGNOSIS — K439 Ventral hernia without obstruction or gangrene: Secondary | ICD-10-CM | POA: Diagnosis not present

## 2020-12-27 DIAGNOSIS — Z8572 Personal history of non-Hodgkin lymphomas: Secondary | ICD-10-CM | POA: Insufficient documentation

## 2020-12-27 DIAGNOSIS — C3490 Malignant neoplasm of unspecified part of unspecified bronchus or lung: Secondary | ICD-10-CM

## 2020-12-27 DIAGNOSIS — Z79899 Other long term (current) drug therapy: Secondary | ICD-10-CM | POA: Diagnosis not present

## 2020-12-27 DIAGNOSIS — N400 Enlarged prostate without lower urinary tract symptoms: Secondary | ICD-10-CM | POA: Insufficient documentation

## 2020-12-27 DIAGNOSIS — R5383 Other fatigue: Secondary | ICD-10-CM | POA: Insufficient documentation

## 2020-12-27 DIAGNOSIS — N99820 Postprocedural hemorrhage and hematoma of a genitourinary system organ or structure following a genitourinary system procedure: Secondary | ICD-10-CM | POA: Insufficient documentation

## 2020-12-27 DIAGNOSIS — C8590 Non-Hodgkin lymphoma, unspecified, unspecified site: Secondary | ICD-10-CM | POA: Diagnosis not present

## 2020-12-27 DIAGNOSIS — K5730 Diverticulosis of large intestine without perforation or abscess without bleeding: Secondary | ICD-10-CM | POA: Diagnosis not present

## 2020-12-27 DIAGNOSIS — K429 Umbilical hernia without obstruction or gangrene: Secondary | ICD-10-CM | POA: Diagnosis not present

## 2020-12-27 DIAGNOSIS — I10 Essential (primary) hypertension: Secondary | ICD-10-CM | POA: Insufficient documentation

## 2020-12-27 DIAGNOSIS — I252 Old myocardial infarction: Secondary | ICD-10-CM | POA: Insufficient documentation

## 2020-12-27 LAB — CBC WITH DIFFERENTIAL (CANCER CENTER ONLY)
Abs Immature Granulocytes: 0.01 10*3/uL (ref 0.00–0.07)
Basophils Absolute: 0.1 10*3/uL (ref 0.0–0.1)
Basophils Relative: 2 %
Eosinophils Absolute: 0.2 10*3/uL (ref 0.0–0.5)
Eosinophils Relative: 4 %
HCT: 37.6 % — ABNORMAL LOW (ref 39.0–52.0)
Hemoglobin: 12.1 g/dL — ABNORMAL LOW (ref 13.0–17.0)
Immature Granulocytes: 0 %
Lymphocytes Relative: 32 %
Lymphs Abs: 1.7 10*3/uL (ref 0.7–4.0)
MCH: 25.8 pg — ABNORMAL LOW (ref 26.0–34.0)
MCHC: 32.2 g/dL (ref 30.0–36.0)
MCV: 80.2 fL (ref 80.0–100.0)
Monocytes Absolute: 0.8 10*3/uL (ref 0.1–1.0)
Monocytes Relative: 14 %
Neutro Abs: 2.6 10*3/uL (ref 1.7–7.7)
Neutrophils Relative %: 48 %
Platelet Count: 271 10*3/uL (ref 150–400)
RBC: 4.69 MIL/uL (ref 4.22–5.81)
RDW: 19.6 % — ABNORMAL HIGH (ref 11.5–15.5)
WBC Count: 5.4 10*3/uL (ref 4.0–10.5)
nRBC: 0 % (ref 0.0–0.2)

## 2020-12-27 LAB — CMP (CANCER CENTER ONLY)
ALT: 24 U/L (ref 0–44)
AST: 30 U/L (ref 15–41)
Albumin: 3.9 g/dL (ref 3.5–5.0)
Alkaline Phosphatase: 117 U/L (ref 38–126)
Anion gap: 10 (ref 5–15)
BUN: 20 mg/dL (ref 8–23)
CO2: 25 mmol/L (ref 22–32)
Calcium: 9.6 mg/dL (ref 8.9–10.3)
Chloride: 106 mmol/L (ref 98–111)
Creatinine: 1.27 mg/dL — ABNORMAL HIGH (ref 0.61–1.24)
GFR, Estimated: 57 mL/min — ABNORMAL LOW (ref >60)
Glucose, Bld: 75 mg/dL (ref 70–99)
Potassium: 4.5 mmol/L (ref 3.5–5.1)
Sodium: 141 mmol/L (ref 135–145)
Total Bilirubin: 0.7 mg/dL (ref 0.3–1.2)
Total Protein: 6.8 g/dL (ref 6.5–8.1)

## 2020-12-27 LAB — LACTATE DEHYDROGENASE: LDH: 235 U/L — ABNORMAL HIGH (ref 98–192)

## 2020-12-27 MED ORDER — IOHEXOL 350 MG/ML SOLN
80.0000 mL | Freq: Once | INTRAVENOUS | Status: AC | PRN
  Administered 2020-12-27: 80 mL via INTRAVENOUS

## 2020-12-29 ENCOUNTER — Encounter: Payer: Self-pay | Admitting: Internal Medicine

## 2020-12-29 ENCOUNTER — Encounter: Payer: Self-pay | Admitting: Emergency Medicine

## 2020-12-29 ENCOUNTER — Other Ambulatory Visit: Payer: Self-pay

## 2020-12-29 ENCOUNTER — Inpatient Hospital Stay: Payer: Medicare Other | Admitting: Internal Medicine

## 2020-12-29 ENCOUNTER — Ambulatory Visit (INDEPENDENT_AMBULATORY_CARE_PROVIDER_SITE_OTHER): Payer: Medicare Other | Admitting: Emergency Medicine

## 2020-12-29 VITALS — BP 130/70 | HR 62 | Ht 67.0 in | Wt 156.0 lb

## 2020-12-29 VITALS — BP 143/87 | HR 76 | Temp 96.3°F | Resp 18 | Ht 67.0 in | Wt 153.8 lb

## 2020-12-29 DIAGNOSIS — I2510 Atherosclerotic heart disease of native coronary artery without angina pectoris: Secondary | ICD-10-CM | POA: Diagnosis not present

## 2020-12-29 DIAGNOSIS — C8208 Follicular lymphoma grade I, lymph nodes of multiple sites: Secondary | ICD-10-CM

## 2020-12-29 DIAGNOSIS — Z9221 Personal history of antineoplastic chemotherapy: Secondary | ICD-10-CM | POA: Diagnosis not present

## 2020-12-29 DIAGNOSIS — Z8572 Personal history of non-Hodgkin lymphomas: Secondary | ICD-10-CM | POA: Diagnosis not present

## 2020-12-29 DIAGNOSIS — Z8579 Personal history of other malignant neoplasms of lymphoid, hematopoietic and related tissues: Secondary | ICD-10-CM | POA: Diagnosis not present

## 2020-12-29 DIAGNOSIS — D500 Iron deficiency anemia secondary to blood loss (chronic): Secondary | ICD-10-CM | POA: Diagnosis not present

## 2020-12-29 DIAGNOSIS — I252 Old myocardial infarction: Secondary | ICD-10-CM | POA: Diagnosis not present

## 2020-12-29 DIAGNOSIS — Z0001 Encounter for general adult medical examination with abnormal findings: Secondary | ICD-10-CM

## 2020-12-29 DIAGNOSIS — I10 Essential (primary) hypertension: Secondary | ICD-10-CM | POA: Diagnosis not present

## 2020-12-29 DIAGNOSIS — K5730 Diverticulosis of large intestine without perforation or abscess without bleeding: Secondary | ICD-10-CM | POA: Diagnosis not present

## 2020-12-29 DIAGNOSIS — R5383 Other fatigue: Secondary | ICD-10-CM | POA: Diagnosis not present

## 2020-12-29 DIAGNOSIS — Z23 Encounter for immunization: Secondary | ICD-10-CM | POA: Diagnosis not present

## 2020-12-29 DIAGNOSIS — K5790 Diverticulosis of intestine, part unspecified, without perforation or abscess without bleeding: Secondary | ICD-10-CM | POA: Insufficient documentation

## 2020-12-29 DIAGNOSIS — Z79899 Other long term (current) drug therapy: Secondary | ICD-10-CM | POA: Diagnosis not present

## 2020-12-29 DIAGNOSIS — N400 Enlarged prostate without lower urinary tract symptoms: Secondary | ICD-10-CM | POA: Diagnosis not present

## 2020-12-29 DIAGNOSIS — N99820 Postprocedural hemorrhage and hematoma of a genitourinary system organ or structure following a genitourinary system procedure: Secondary | ICD-10-CM | POA: Diagnosis not present

## 2020-12-29 DIAGNOSIS — I700 Atherosclerosis of aorta: Secondary | ICD-10-CM

## 2020-12-29 NOTE — Patient Instructions (Signed)
Health Maintenance, Male Adopting a healthy lifestyle and getting preventive care are important in promoting health and wellness. Ask your health care provider about: The right schedule for you to have regular tests and exams. Things you can do on your own to prevent diseases and keep yourself healthy. What should I know about diet, weight, and exercise? Eat a healthy diet  Eat a diet that includes plenty of vegetables, fruits, low-fat dairy products, and lean protein. Do not eat a lot of foods that are high in solid fats, added sugars, or sodium. Maintain a healthy weight Body mass index (BMI) is a measurement that can be used to identify possible weight problems. It estimates body fat based on height and weight. Your health care provider can help determine your BMI and help you achieve or maintain a healthy weight. Get regular exercise Get regular exercise. This is one of the most important things you can do for your health. Most adults should: Exercise for at least 150 minutes each week. The exercise should increase your heart rate and make you sweat (moderate-intensity exercise). Do strengthening exercises at least twice a week. This is in addition to the moderate-intensity exercise. Spend less time sitting. Even light physical activity can be beneficial. Watch cholesterol and blood lipids Have your blood tested for lipids and cholesterol at 80 years of age, then have this test every 5 years. You may need to have your cholesterol levels checked more often if: Your lipid or cholesterol levels are high. You are older than 80 years of age. You are at high risk for heart disease. What should I know about cancer screening? Many types of cancers can be detected early and may often be prevented. Depending on your health history and family history, you may need to have cancer screening at various ages. This may include screening for: Colorectal cancer. Prostate cancer. Skin cancer. Lung  cancer. What should I know about heart disease, diabetes, and high blood pressure? Blood pressure and heart disease High blood pressure causes heart disease and increases the risk of stroke. This is more likely to develop in people who have high blood pressure readings, are of African descent, or are overweight. Talk with your health care provider about your target blood pressure readings. Have your blood pressure checked: Every 3-5 years if you are 18-39 years of age. Every year if you are 40 years old or older. If you are between the ages of 65 and 75 and are a current or former smoker, ask your health care provider if you should have a one-time screening for abdominal aortic aneurysm (AAA). Diabetes Have regular diabetes screenings. This checks your fasting blood sugar level. Have the screening done: Once every three years after age 45 if you are at a normal weight and have a low risk for diabetes. More often and at a younger age if you are overweight or have a high risk for diabetes. What should I know about preventing infection? Hepatitis B If you have a higher risk for hepatitis B, you should be screened for this virus. Talk with your health care provider to find out if you are at risk for hepatitis B infection. Hepatitis C Blood testing is recommended for: Everyone born from 1945 through 1965. Anyone with known risk factors for hepatitis C. Sexually transmitted infections (STIs) You should be screened each year for STIs, including gonorrhea and chlamydia, if: You are sexually active and are younger than 80 years of age. You are older than 80 years   of age and your health care provider tells you that you are at risk for this type of infection. Your sexual activity has changed since you were last screened, and you are at increased risk for chlamydia or gonorrhea. Ask your health care provider if you are at risk. Ask your health care provider about whether you are at high risk for HIV.  Your health care provider may recommend a prescription medicine to help prevent HIV infection. If you choose to take medicine to prevent HIV, you should first get tested for HIV. You should then be tested every 3 months for as long as you are taking the medicine. Follow these instructions at home: Lifestyle Do not use any products that contain nicotine or tobacco, such as cigarettes, e-cigarettes, and chewing tobacco. If you need help quitting, ask your health care provider. Do not use street drugs. Do not share needles. Ask your health care provider for help if you need support or information about quitting drugs. Alcohol use Do not drink alcohol if your health care provider tells you not to drink. If you drink alcohol: Limit how much you have to 0-2 drinks a day. Be aware of how much alcohol is in your drink. In the [place] one drink equals one 12 oz bottle of beer (355 mL), one 5 oz glass of wine (148 mL), or one 1 oz glass of hard liquor (44 mL). General instructions Schedule regular health, dental, and eye exams. Stay current with your vaccines. Tell your health care provider if: You often feel depressed. You have ever been abused or do not feel safe at home. Summary Adopting a healthy lifestyle and getting preventive care are important in promoting health and wellness. Follow your health care provider's instructions about healthy diet, exercising, and getting tested or screened for diseases. Follow your health care provider's instructions on monitoring your cholesterol and blood pressure. This information is not intended to replace advice given to you by your health care provider. Make sure you discuss any questions you have with your health care provider. Document Revised: 04/29/2020 Document Reviewed: 02/12/2018 Elsevier Patient Education  2022 [employer]  

## 2020-12-29 NOTE — Progress Notes (Signed)
Eric [name] 80 y.o.   Chief Complaint  Patient presents with   Annual Exam    HISTORY OF PRESENT ILLNESS: This is a 80 y.o. male here for annual exam. Patient has a history of lymphoma.  Saw Dr. Earlie Server earlier today for follow-up.  Doing well. Recent CT scan of abdomen and pelvis shows no findings to suggest residual or recurrent lymphoma in the chest abdomen or pelvis. It also shows enlarged prostate with a Foley balloon catheter inserted. It also shows aortic atherosclerosis and coronary artery disease which patient is aware of.  Patient takes atorvastatin 80 mg daily and 1 baby aspirins daily. It also shows colonic diverticulosis. Generally doing well.  Has no complaints or medical concerns today. Dr. Worthy Flank office visit assessment and plan as follows: ASSESSMENT AND PLAN:  This is a very pleasant 80 years old African-American male with bulky stage IV follicular lymphoma initially treated with CHOP/Rituxan followed by maintenance Rituxan. The patient had disease recurrence and he was restarted again on treatment with Rituxan every 2 months status post 22 cycles. The patient has been tolerating this treatment well with no concerning adverse effects. The patient was found to have progression in December 2019.  He was started on systemic chemotherapy with bendamustine and Rituxan status post 4 cycles.   The patient tolerated the previous treatment fairly well.  He is feeling fine today with no concerning complaints except for the fatigue secondary to the anemia from bleeding after prostate biopsy at [facility]. Repeat blood work today is unremarkable except for the mild anemia as well as mild renal insufficiency. He had repeat CT scan of the chest, abdomen pelvis performed recently.  I personally and independently reviewed the scan images and discussed the results with the patient.  The final report is still pending.  Based on my review of the scan I do not see any  concerning findings for disease recurrence or progression but I will wait for the final report for confirmation. I will see the patient back for follow-up visit in 6 months for evaluation and repeat blood work. For the anemia, he will continue on the oral iron tablets for now. He was advised to call immediately if he has any other concerning symptoms in the interval. The patient voices understanding of current disease status and treatment options and is in agreement with the current care plan. All questions were answered. The patient knows to call the clinic with any problems, questions or concerns. We can certainly see the patient much sooner if necessary.    HPI   Prior to Admission medications   Medication Sig Start Date End Date Taking? Authorizing Provider  aspirin EC 81 MG tablet Take 81 mg by mouth at bedtime.   Yes [provider]  atorvastatin (LIPITOR) 80 MG tablet Take 1 tablet (80 mg total) by mouth daily. 08/17/20 12/29/20 Yes Weaver, Scott T, PA-C  diphenhydrAMINE (BENADRYL) 25 MG tablet Take 25 mg by mouth at bedtime as needed for sleep.    Yes [provider]  ferrous sulfate (FERROUSUL) 325 (65 FE) MG tablet Take 1 tablet (325 mg total) by mouth 2 (two) times daily with a meal. 08/23/20  Yes Weaver, Scott T, PA-C  losartan (COZAAR) 100 MG tablet Take 100 mg by mouth as directed. 05/07/20  Yes [provider]  omeprazole (PRILOSEC) 20 MG capsule Take 20 mg by mouth daily as needed (acid relfux).   Yes [provider]  Polyethylene Glycol 3350 (PEG 3350) POWD  Take by mouth. 02/11/18  Yes [provider]  sildenafil (VIAGRA) 100 MG tablet Take 100 mg by mouth daily as needed. 12/10/17  Yes [provider]  traZODone (DESYREL) 100 MG tablet TAKE 1 TABLET BY MOUTH EVERY NIGHT AT BEDTIME AS NEEDED FOR FOR SLEEP 08/01/20  Yes Horald Pollen, MD    Allergies  Allergen Reactions   Neosporin [Neomycin-Bacitracin Zn-Polymyx]  Anaphylaxis    Patient Active Problem List   Diagnosis Date Noted   Herpes zoster without complication 68/34/1962   Hypercholesterolemia 22/97/9892   Grade 1 follicular lymphoma of lymph nodes of multiple regions ([place]) 02/16/2015   CAD in native artery 08/18/2014   Hearing loss 07/11/2012   Insomnia, idiopathic 07/11/2012   Erectile dysfunction 07/11/2012   Non-STEMI (non-ST elevated myocardial infarction) ([place]) 03/06/2012   Lymphoma ([place]) 06/05/2011    Class: Chronic    Past Medical History:  Diagnosis Date   Cancer ([place])    Coronary atherosclerosis of native coronary artery    a. s/p MI with DES/PCI to the RCA in 2013.   Encounter for antineoplastic chemotherapy 06/20/2015   Essential hypertension    GERD (gastroesophageal reflux disease)    History of heart attack    Hyperlipidemia    Ischemic cardiomyopathy    Myocardial infarction ([place])    Non Hodgkin's lymphoma (HCC)    Plavix resistance    Plavix hyporesponder 2013   Sinus bradycardia    baseline HR 50s at times    Past Surgical History:  Procedure Laterality Date   CORONARY STENT PLACEMENT     LEFT HEART CATHETERIZATION WITH CORONARY ANGIOGRAM N/A 03/03/2012   Procedure: LEFT HEART CATHETERIZATION WITH CORONARY ANGIOGRAM;  Surgeon: [name], MD;  Location: [facility] CATH LAB;  Service: Cardiovascular;  Laterality: N/A;   PERCUTANEOUS CORONARY STENT INTERVENTION (PCI-S) N/A 03/04/2012   Procedure: PERCUTANEOUS CORONARY STENT INTERVENTION (PCI-S);  Surgeon: [name], MD;  Location: [facility] CATH LAB;  Service: Cardiovascular;  Laterality: N/A;    Social History   Socioeconomic History   Marital status: Married    Spouse name: Not on file   Number of children: Not on file   Years of education: Not on file   Highest education level: Not on file  Occupational History   Occupation: pharmacist  Tobacco Use   Smoking status: Never   Smokeless tobacco: Never  Vaping Use   Vaping Use: Never used  Substance and  Sexual Activity   Alcohol use: Yes    Comment: occasionally   Drug use: No   Sexual activity: Yes  Other Topics Concern   Not on file  Social History Narrative   Marital status: married since 83 years; from [place], [place]; two hours from [place]; below [place]: 2 children (53, 27); no grandchildren      Lives: with wife, 2 children      Employment: Software engineer; independent Restaurant manager, 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       Tobacco: never      Alcohol:  Socially; almost none in 2017      Exercise in door track, cardio machine and treadmill 1-2 times per week      ADLs: independent with ADLs;       Advanced Directives: no; desires FULL CODE.    Social Determinants of Health   Financial Resource Strain: Not on file  Food Insecurity: Not on file  Transportation Needs: Not on file  Physical Activity: Not on  file  Stress: Not on file  Social Connections: Not on file  Intimate Partner Violence: Not on file    Family History  Problem Relation Age of Onset   Leukemia Mother      Review of Systems  Constitutional: Negative.  Negative for chills and fever.  HENT: Negative.  Negative for congestion and sore throat.   Respiratory: Negative.  Negative for cough and shortness of breath.   Cardiovascular: Negative.  Negative for chest pain and palpitations.  Gastrointestinal:  Negative for abdominal pain, diarrhea, nausea and vomiting.  Genitourinary: Negative.  Negative for dysuria and hematuria.  Skin: Negative.  Negative for rash.  Neurological: Negative.  Negative for dizziness and headaches.  All other systems reviewed and are negative.   Physical Exam Vitals reviewed.  Constitutional:      Appearance: Normal appearance.  HENT:     Head: Normocephalic.     Right Ear: Tympanic membrane, ear canal and external ear normal.     Ears:     Comments: Hearing aid on the left side Eyes:     Extraocular Movements: Extraocular movements intact.      Conjunctiva/sclera: Conjunctivae normal.     Pupils: Pupils are equal, round, and reactive to light.  Cardiovascular:     Rate and Rhythm: Normal rate and regular rhythm.     Pulses: Normal pulses.     Heart sounds: Normal heart sounds.  Pulmonary:     Effort: Pulmonary effort is normal.     Breath sounds: Normal breath sounds.  Abdominal:     General: Bowel sounds are normal. There is no distension.     Palpations: Abdomen is soft.     Tenderness: There is no abdominal tenderness.  Musculoskeletal:        General: Normal range of motion.     Cervical back: Normal range of motion and neck supple.     Right lower leg: No edema.     Left lower leg: No edema.  Skin:    General: Skin is warm and dry.     Capillary Refill: Capillary refill takes less than 2 seconds.  Neurological:     General: No focal deficit present.     Mental Status: He is alert and oriented to person, place, and time.  Psychiatric:        Mood and Affect: Mood normal.        Behavior: Behavior normal.     ASSESSMENT & PLAN: Problem List Items Addressed This Visit       Cardiovascular and Mediastinum   CAD in native artery   Atherosclerosis of aorta (HCC)   Essential hypertension     Digestive   Diverticulosis     Immune and Lymphatic   Follicular lymphoma grade I of lymph nodes of multiple sites [facility])   Other Visit Diagnoses     Encounter for general adult medical examination with abnormal findings    -  Primary   Need for influenza vaccination       Relevant Orders   Flu Vaccine QUAD High Dose(Fluad) (Completed)   History of lymphoma          Modifiable risk factors discussed with patient. Anticipatory guidance according to age provided. The following topics were also discussed: Social Determinants of Health Smoking.  Non-smoker Diet and nutrition Benefits of exercise Cancer history of lymphoma and review of most recent oncologist office visit note and most recent CT scan of abdomen and  pelvis Vaccinations recommendations Cardiovascular risk  assessment and known history of coronary artery disease Mental health including depression and anxiety Review of most recent blood work results Fall and accident prevention  Patient Instructions  Health Maintenance, Male Adopting a healthy lifestyle and getting preventive care are important in promoting health and wellness. Ask your health care provider about: The right schedule for you to have regular tests and exams. Things you can do on your own to prevent diseases and keep yourself healthy. What should I know about diet, weight, and exercise? Eat a healthy diet  Eat a diet that includes plenty of vegetables, fruits, low-fat dairy products, and lean protein. Do not eat a lot of foods that are high in solid fats, added sugars, or sodium. Maintain a healthy weight Body mass index (BMI) is a measurement that can be used to identify possible weight problems. It estimates body fat based on height and weight. Your health care provider can help determine your BMI and help you achieve or maintain a healthy weight. Get regular exercise Get regular exercise. This is one of the most important things you can do for your health. Most adults should: Exercise for at least 150 minutes each week. The exercise should increase your heart rate and make you sweat (moderate-intensity exercise). Do strengthening exercises at least twice a week. This is in addition to the moderate-intensity exercise. Spend less time sitting. Even light physical activity can be beneficial. Watch cholesterol and blood lipids Have your blood tested for lipids and cholesterol at 80 years of age, then have this test every 5 years. You may need to have your cholesterol levels checked more often if: Your lipid or cholesterol levels are high. You are older than 80 years of age. You are at high risk for heart disease. What should I know about cancer screening? Many types of  cancers can be detected early and may often be prevented. Depending on your health history and family history, you may need to have cancer screening at various ages. This may include screening for: Colorectal cancer. Prostate cancer. Skin cancer. Lung cancer. What should I know about heart disease, diabetes, and high blood pressure? Blood pressure and heart disease High blood pressure causes heart disease and increases the risk of stroke. This is more likely to develop in people who have high blood pressure readings, are of African descent, or are overweight. Talk with your health care provider about your target blood pressure readings. Have your blood pressure checked: Every 3-5 years if you are 48-65 years of age. Every year if you are 49 years old or older. If you are between the ages of 75 and 39 and are a current or former smoker, ask your health care provider if you should have a one-time screening for abdominal aortic aneurysm (AAA). Diabetes Have regular diabetes screenings. This checks your fasting blood sugar level. Have the screening done: Once every three years after age 44 if you are at a normal weight and have a low risk for diabetes. More often and at a younger age if you are overweight or have a high risk for diabetes. What should I know about preventing infection? Hepatitis B If you have a higher risk for hepatitis B, you should be screened for this virus. Talk with your health care provider to find out if you are at risk for hepatitis B infection. Hepatitis C Blood testing is recommended for: Everyone born from [birth year] through 1965. Anyone with known risk factors for hepatitis C. Sexually transmitted infections (STIs) You should  be screened each year for STIs, including gonorrhea and chlamydia, if: You are sexually active and are younger than 80 years of age. You are older than 80 years of age and your health care provider tells you that you are at risk for this type of  infection. Your sexual activity has changed since you were last screened, and you are at increased risk for chlamydia or gonorrhea. Ask your health care provider if you are at risk. Ask your health care provider about whether you are at high risk for HIV. Your health care provider may recommend a prescription medicine to help prevent HIV infection. If you choose to take medicine to prevent HIV, you should first get tested for HIV. You should then be tested every 3 months for as long as you are taking the medicine. Follow these instructions at home: Lifestyle Do not use any products that contain nicotine or tobacco, such as cigarettes, e-cigarettes, and chewing tobacco. If you need help quitting, ask your health care provider. Do not use street drugs. Do not share needles. Ask your health care provider for help if you need support or information about quitting drugs. Alcohol use Do not drink alcohol if your health care provider tells you not to drink. If you drink alcohol: Limit how much you have to 0-2 drinks a day. Be aware of how much alcohol is in your drink. In the [place] one drink equals one 12 oz bottle of beer (355 mL), one 5 oz glass of wine (148 mL), or one 1 oz glass of hard liquor (44 mL). General instructions Schedule regular health, dental, and eye exams. Stay current with your vaccines. Tell your health care provider if: You often feel depressed. You have ever been abused or do not feel safe at home. Summary Adopting a healthy lifestyle and getting preventive care are important in promoting health and wellness. Follow your health care provider's instructions about healthy diet, exercising, and getting tested or screened for diseases. Follow your health care provider's instructions on monitoring your cholesterol and blood pressure. This information is not intended to replace advice given to you by your health care provider. Make sure you discuss any questions you have with your  health care provider. Document Revised: 04/29/2020 Document Reviewed: 02/12/2018 Elsevier Patient Education  2022 [place], MD [facility]

## 2020-12-29 NOTE — Progress Notes (Signed)
[place] [phone]   [phone]  OFFICE PROGRESS NOTE  DIAGNOSIS: Bulky stage IV low-grade lymphoma diagnosed in November 2009.  PRIOR THERAPY: Status post 7 cycles of systemic chemotherapy with CHOP/Rituxan. Last dose was given May 2010. Status post 6 cycles of maintenance Rituxan 375 mg/sq m given every 2 months. Last dose was given on May 29, 2010, and the patient was lost to followup at that time. He received another dose on 12/11/2010, then again missed 2 doses. systemic chemotherapy with Rituxan 375 mg/M2 on day 1 and that bendamustine 90 mg/M2 on days 1 and 2 every 4 weeks, status post 6 cycles. Maintenance Rituxan 375 mg/M2 every 2 months, status post 12 cycles  Rituxan 375 MG/M2 given weekly for 4 weeks and this is followed by maintenance treatment every 2 months, status post 22 cycles discontinued secondary to disease progression. Systemic chemotherapy with bendamustine 90 mg/M2 on days 1 and 2 and Rituxan 375 mg/M2 on day 1 every 4 weeks.  First dose March 03, 2018.  Status post 4 cycles.  CURRENT THERAPY: Observation.  INTERVAL HISTORY: Eric [name] 80 y.o. male returns to the clinic today for follow-up visit.  The patient is feeling fine today with no concerning complaints.  He had significant bleeding after a prostate biopsy at [facility] and lost a lot of blood.  He started on oral iron tablet and started feeling better.  He denied having any current chest pain, shortness of breath, cough or hemoptysis.  He denied having any fever or chills.  He has no nausea, vomiting, diarrhea or constipation.  He has no headache or visual changes.  He is here today for evaluation with repeat CT scan of the chest, abdomen and pelvis for restaging of his disease.  MEDICAL HISTORY: Past Medical History:  Diagnosis Date   Cancer [facility])    Coronary atherosclerosis of native coronary artery    a. s/p MI with DES/PCI to the RCA in  2013.   Encounter for antineoplastic chemotherapy 06/20/2015   Essential hypertension    GERD (gastroesophageal reflux disease)    History of heart attack    Hyperlipidemia    Ischemic cardiomyopathy    Myocardial infarction ([place])    Non Hodgkin's lymphoma (HCC)    Plavix resistance    Plavix hyporesponder 2013   Sinus bradycardia    baseline HR 50s at times    ALLERGIES:  is allergic to neosporin [neomycin-bacitracin zn-polymyx].  MEDICATIONS:  Current Outpatient Medications  Medication Sig Dispense Refill   aspirin EC 81 MG tablet Take 81 mg by mouth at bedtime.     atorvastatin (LIPITOR) 80 MG tablet Take 1 tablet (80 mg total) by mouth daily. 90 tablet 3   diphenhydrAMINE (BENADRYL) 25 MG tablet Take 25 mg by mouth at bedtime as needed for sleep.      ferrous sulfate (FERROUSUL) 325 (65 FE) MG tablet Take 1 tablet (325 mg total) by mouth 2 (two) times daily with a meal. 180 tablet 3   omeprazole (PRILOSEC) 20 MG capsule Take 20 mg by mouth daily as needed (acid relfux).     Polyethylene Glycol 3350 (PEG 3350) POWD Take by mouth.     potassium chloride SA (KLOR-CON) 20 MEQ tablet Take 1 tablet (20 mEq total) by mouth daily for 7 days. 7 tablet 0   sildenafil (VIAGRA) 100 MG tablet Take 100 mg by mouth daily as needed.  3   traZODone (DESYREL) 100 MG  tablet TAKE 1 TABLET BY MOUTH EVERY NIGHT AT BEDTIME AS NEEDED FOR FOR SLEEP 90 tablet 0   No current facility-administered medications for this visit.    SURGICAL HISTORY:  Past Surgical History:  Procedure Laterality Date   CORONARY STENT PLACEMENT     LEFT HEART CATHETERIZATION WITH CORONARY ANGIOGRAM N/A 03/03/2012   Procedure: LEFT HEART CATHETERIZATION WITH CORONARY ANGIOGRAM;  Surgeon: [name], MD;  Location: [facility] CATH LAB;  Service: Cardiovascular;  Laterality: N/A;   PERCUTANEOUS CORONARY STENT INTERVENTION (PCI-S) N/A 03/04/2012   Procedure: PERCUTANEOUS CORONARY STENT INTERVENTION (PCI-S);  Surgeon: [name], MD;  Location: [facility] CATH LAB;  Service: Cardiovascular;  Laterality: N/A;    REVIEW OF SYSTEMS:  Constitutional: positive for fatigue Eyes: negative Ears, nose, mouth, throat, and face: negative Respiratory: negative Cardiovascular: negative Gastrointestinal: negative Genitourinary:negative Integument/breast: negative Hematologic/lymphatic: negative Musculoskeletal:negative Neurological: negative Behavioral/Psych: negative Endocrine: negative Allergic/Immunologic: negative   PHYSICAL EXAMINATION: General appearance: alert, cooperative, fatigued, and no distress Head: Normocephalic, without obvious abnormality, atraumatic Neck: no adenopathy, no JVD, supple, symmetrical, trachea midline, and thyroid not enlarged, symmetric, no tenderness/mass/nodules Lymph nodes: Cervical, supraclavicular, and axillary nodes normal. Resp: clear to auscultation bilaterally Back: symmetric, no curvature. ROM normal. No CVA tenderness. Cardio: regular rate and rhythm, S1, S2 normal, no murmur, click, rub or gallop GI: soft, non-tender; bowel sounds normal; no masses,  no organomegaly Extremities: extremities normal, atraumatic, no cyanosis or edema Neurologic: Alert and oriented X 3, normal strength and tone. Normal symmetric reflexes. Normal coordination and gait  ECOG PERFORMANCE STATUS: 0 - Asymptomatic  Blood pressure (!) 143/87, pulse 76, temperature (!) 96.3 F (35.7 C), temperature source Tympanic, resp. rate 18, height 5\' 7"  (1.702 m), weight 153 lb 12.8 oz (69.8 kg), SpO2 100 %.  LABORATORY DATA: Lab Results  Component Value Date   WBC 5.4 12/27/2020   HGB 12.1 (L) 12/27/2020   HCT 37.6 (L) 12/27/2020   MCV 80.2 12/27/2020   PLT 271 12/27/2020      Chemistry      Component Value Date/Time   NA 141 12/27/2020 1346   NA 141 08/19/2020 0957   NA 140 02/14/2017 0804   K 4.5 12/27/2020 1346   K 4.4 02/14/2017 0804   CL 106 12/27/2020 1346   CL 106 08/06/2012 1244   CO2 25  12/27/2020 1346   CO2 22 02/14/2017 0804   BUN 20 12/27/2020 1346   BUN 20 08/19/2020 0957   BUN 14.8 02/14/2017 0804   CREATININE 1.27 (H) 12/27/2020 1346   CREATININE 1.1 02/14/2017 0804      Component Value Date/Time   CALCIUM 9.6 12/27/2020 1346   CALCIUM 9.2 02/14/2017 0804   ALKPHOS 117 12/27/2020 1346   ALKPHOS 138 02/14/2017 0804   AST 30 12/27/2020 1346   AST 29 02/14/2017 0804   ALT 24 12/27/2020 1346   ALT 27 02/14/2017 0804   BILITOT 0.7 12/27/2020 1346   BILITOT 0.61 02/14/2017 0804       RADIOGRAPHIC STUDIES: No results found.  ASSESSMENT AND PLAN:  This is a very pleasant 80 years old African-American male with bulky stage IV follicular lymphoma initially treated with CHOP/Rituxan followed by maintenance Rituxan. The patient had disease recurrence and he was restarted again on treatment with Rituxan every 2 months status post 22 cycles. The patient has been tolerating this treatment well with no concerning adverse effects. The patient was found to have progression in December 2019.  He was started on systemic chemotherapy  with bendamustine and Rituxan status post 4 cycles.   The patient tolerated the previous treatment fairly well.  He is feeling fine today with no concerning complaints except for the fatigue secondary to the anemia from bleeding after prostate biopsy at [facility]. Repeat blood work today is unremarkable except for the mild anemia as well as mild renal insufficiency. He had repeat CT scan of the chest, abdomen pelvis performed recently.  I personally and independently reviewed the scan images and discussed the results with the patient.  The final report is still pending.  Based on my review of the scan I do not see any concerning findings for disease recurrence or progression but I will wait for the final report for confirmation. I will see the patient back for follow-up visit in 6 months for evaluation and repeat blood work. For the  anemia, he will continue on the oral iron tablets for now. He was advised to call immediately if he has any other concerning symptoms in the interval. The patient voices understanding of current disease status and treatment options and is in agreement with the current care plan. All questions were answered. The patient knows to call the clinic with any problems, questions or concerns. We can certainly see the patient much sooner if necessary.   Disclaimer: This note was dictated with voice recognition software. Similar sounding words can inadvertently be transcribed and may not be corrected upon review.

## 2021-01-02 ENCOUNTER — Telehealth: Payer: Self-pay | Admitting: Internal Medicine

## 2021-01-02 NOTE — Telephone Encounter (Signed)
Unable to leave voicemail with follow-up appointment per 10/27 los. Patient is Mychart active.

## 2021-02-06 DIAGNOSIS — R338 Other retention of urine: Secondary | ICD-10-CM | POA: Diagnosis not present

## 2021-02-06 DIAGNOSIS — Z466 Encounter for fitting and adjustment of urinary device: Secondary | ICD-10-CM | POA: Diagnosis not present

## 2021-02-06 DIAGNOSIS — N401 Enlarged prostate with lower urinary tract symptoms: Secondary | ICD-10-CM | POA: Diagnosis not present

## 2021-03-03 DIAGNOSIS — N401 Enlarged prostate with lower urinary tract symptoms: Secondary | ICD-10-CM | POA: Diagnosis not present

## 2021-03-03 DIAGNOSIS — R338 Other retention of urine: Secondary | ICD-10-CM | POA: Diagnosis not present

## 2021-04-23 NOTE — Progress Notes (Signed)
Cardiology Office Note:    Date:  04/24/2021   ID:  Eric [name], DOB 27-Apr-1940, MRN [id number]  PCP:  Horald Pollen, MD  Cardiologist:  [name], MD   Referring MD: Horald Pollen, *   Chief Complaint  Patient presents with   Coronary Artery Disease   Hyperlipidemia    History of Present Illness:    Eric [name] is a 81 y.o. male with a hx of non- Hodgkin's follicular lymphoma, CAD with RCA DES 2013, clopidogrel hypo-responder, prior MI, and hyperlipidemia.   He is doing well.  Not having angina.  Volunteers that he needs to be more active.  He walks 20 minutes 7 days a week.  He denies chest pain.  No nitroglycerin use.  Past Medical History:  Diagnosis Date   Cancer [facility])    Coronary atherosclerosis of native coronary artery    a. s/p MI with DES/PCI to the RCA in 2013.   Encounter for antineoplastic chemotherapy 06/20/2015   Essential hypertension    GERD (gastroesophageal reflux disease)    History of heart attack    Hyperlipidemia    Ischemic cardiomyopathy    Myocardial infarction ([place])    Non Hodgkin's lymphoma (HCC)    Plavix resistance    Plavix hyporesponder 2013   Sinus bradycardia    baseline HR 50s at times    Past Surgical History:  Procedure Laterality Date   CORONARY STENT PLACEMENT     LEFT HEART CATHETERIZATION WITH CORONARY ANGIOGRAM N/A 03/03/2012   Procedure: LEFT HEART CATHETERIZATION WITH CORONARY ANGIOGRAM;  Surgeon: [name], MD;  Location: [facility] CATH LAB;  Service: Cardiovascular;  Laterality: N/A;   PERCUTANEOUS CORONARY STENT INTERVENTION (PCI-S) N/A 03/04/2012   Procedure: PERCUTANEOUS CORONARY STENT INTERVENTION (PCI-S);  Surgeon: [name], MD;  Location: [facility] CATH LAB;  Service: Cardiovascular;  Laterality: N/A;    Current Medications: Current Meds  Medication Sig   aspirin EC 81 MG tablet Take 81 mg by mouth at bedtime.   diphenhydrAMINE (BENADRYL) 25 MG tablet Take 25 mg by mouth at bedtime as  needed for sleep.    ferrous sulfate (FERROUSUL) 325 (65 FE) MG tablet Take 1 tablet (325 mg total) by mouth 2 (two) times daily with a meal.   losartan (COZAAR) 100 MG tablet Take 100 mg by mouth as directed.   omeprazole (PRILOSEC) 20 MG capsule Take 20 mg by mouth daily as needed (acid relfux).   Polyethylene Glycol 3350 (PEG 3350) POWD Take by mouth.   sildenafil (VIAGRA) 100 MG tablet Take 100 mg by mouth daily as needed.     Allergies:   Neosporin [neomycin-bacitracin zn-polymyx]   Social History   Socioeconomic History   Marital status: Married    Spouse name: Not on file   Number of children: Not on file   Years of education: Not on file   Highest education level: Not on file  Occupational History   Occupation: pharmacist  Tobacco Use   Smoking status: Never   Smokeless tobacco: Never  Vaping Use   Vaping Use: Never used  Substance and Sexual Activity   Alcohol use: Yes    Comment: occasionally   Drug use: No   Sexual activity: Yes  Other Topics Concern   Not on file  Social History Narrative   Marital status: married since 12 years; from [place], [place]; two hours from [place]; below [place]: 2 children (30, 27); no grandchildren  Lives: with wife, 2 children      Employment: Software engineer; 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       Tobacco: never      Alcohol:  Socially; almost none in 2017      Exercise in door track, cardio machine and treadmill 1-2 times per week      ADLs: independent with ADLs;       Advanced Directives: no; desires FULL CODE.    Social Determinants of Health   Financial Resource Strain: Not on file  Food Insecurity: Not on file  Transportation Needs: Not on file  Physical Activity: Not on file  Stress: Not on file  Social Connections: Not on file     Family History: The patient's family history includes Leukemia in his mother.  ROS:   Please see the history of present illness.     Still concerned about the COVID-19 pandemic and possibility of getting seriously ill since he has lymphoma.  All other systems reviewed and are negative.  EKGs/Labs/Other Studies Reviewed:    The following studies were reviewed today: No new studies  EKG:  EKG 08/10/2020 demonstrates normal sinus rhythm with normal appearance.  Not repeated today.  Recent Labs: 12/27/2020: ALT 24; BUN 20; Creatinine 1.27; Hemoglobin 12.1; Platelet Count 271; Potassium 4.5; Sodium 141  Recent Lipid Panel    Component Value Date/Time   CHOL 140 11/23/2020 0812   TRIG 77 11/23/2020 0812   HDL 49 11/23/2020 0812   CHOLHDL 2.9 11/23/2020 0812   CHOLHDL 3.0 01/05/2015 1440   VLDL 15 01/05/2015 1440   LDLCALC 76 11/23/2020 0812    Physical Exam:    VS:  BP 120/78    Pulse 79    Ht 5\' 7"  (1.702 m)    Wt 160 lb (72.6 kg)    SpO2 99%    BMI 25.06 kg/m     Wt Readings from Last 3 Encounters:  04/24/21 160 lb (72.6 kg)  12/29/20 156 lb (70.8 kg)  12/29/20 153 lb 12.8 oz (69.8 kg)     GEN: Appears younger than his stated age. No acute distress HEENT: Normal NECK: No JVD. LYMPHATICS: No lymphadenopathy CARDIAC: No murmur. RRR no gallop, or edema. VASCULAR:  Normal Pulses. No bruits. RESPIRATORY:  Clear to auscultation without rales, wheezing or rhonchi  ABDOMEN: Soft, non-tender, non-distended, No pulsatile mass, MUSCULOSKELETAL: No deformity  SKIN: Warm and dry NEUROLOGIC:  Alert and oriented x 3 PSYCHIATRIC:  Normal affect   ASSESSMENT:    1. Coronary artery disease involving native coronary artery of native heart without angina pectoris   2. Mixed hyperlipidemia   3. Essential hypertension   4. Ischemic cardiomyopathy    PLAN:    In order of problems listed above:  Secondary prevention discussed. Continue high intensity statin therapy with LDL target less than 70. Continue Cozaar.  Blood pressure is excellent. No evidence of dysfunction or clinical signs of heart  failure.   Overall education and awareness concerning secondary risk prevention was discussed in detail: LDL less than 70, hemoglobin A1c less than 7, blood pressure target less than 130/80 mmHg, >150 minutes of moderate aerobic activity per week, avoidance of smoking, weight control (via diet and exercise), and continued surveillance/management of/for obstructive sleep apnea.    Medication Adjustments/Labs and Tests Ordered: Current medicines are reviewed at length with the patient today.  Concerns regarding medicines are outlined above.  No orders of the defined types were placed in this encounter.  No  orders of the defined types were placed in this encounter.   There are no Patient Instructions on file for this visit.   Signed, [name], MD  04/24/2021 9:28 AM    [place]

## 2021-04-24 ENCOUNTER — Ambulatory Visit: Payer: Medicare Other | Admitting: Interventional Cardiology

## 2021-04-24 ENCOUNTER — Other Ambulatory Visit: Payer: Self-pay

## 2021-04-24 ENCOUNTER — Encounter: Payer: Self-pay | Admitting: Interventional Cardiology

## 2021-04-24 VITALS — BP 120/78 | HR 79 | Ht 67.0 in | Wt 160.0 lb

## 2021-04-24 DIAGNOSIS — E782 Mixed hyperlipidemia: Secondary | ICD-10-CM | POA: Diagnosis not present

## 2021-04-24 DIAGNOSIS — I2510 Atherosclerotic heart disease of native coronary artery without angina pectoris: Secondary | ICD-10-CM

## 2021-04-24 DIAGNOSIS — I10 Essential (primary) hypertension: Secondary | ICD-10-CM | POA: Diagnosis not present

## 2021-04-24 DIAGNOSIS — I255 Ischemic cardiomyopathy: Secondary | ICD-10-CM

## 2021-04-24 NOTE — Patient Instructions (Signed)
Medication Instructions:  Your physician recommends that you continue on your current medications as directed. Please refer to the Current Medication list given to you today.  *If you need a refill on your cardiac medications before your next appointment, please call your pharmacy*   Lab Work: NONE If you have labs (blood work) drawn today and your tests are completely normal, you will receive your results only by: [place] (if you have MyChart) OR A paper copy in the mail If you have any lab test that is abnormal or we need to change your treatment, we will call you to review the results.   Testing/Procedures: NONE   Follow-Up: At [facility], you and your health needs are our priority.  As part of our continuing mission to provide you with exceptional heart care, we have created designated Provider Care Teams.  These Care Teams include your primary Cardiologist (physician) and Advanced Practice Providers (APPs -  Physician Assistants and Nurse Practitioners) who all work together to provide you with the care you need, when you need it.  We recommend signing up for the patient portal called "MyChart".  Sign up information is provided on this After Visit Summary.  MyChart is used to connect with patients for Virtual Visits (Telemedicine).  Patients are able to view lab/test results, encounter notes, upcoming appointments, etc.  Non-urgent messages can be sent to your provider as well.   To learn more about what you can do with MyChart, go to [email or web address].    Your next appointment:   1 year(s)  The format for your next appointment:   In Person  Provider:   [name], MD

## 2021-05-10 DIAGNOSIS — Z466 Encounter for fitting and adjustment of urinary device: Secondary | ICD-10-CM | POA: Diagnosis not present

## 2021-05-10 DIAGNOSIS — R339 Retention of urine, unspecified: Secondary | ICD-10-CM | POA: Diagnosis not present

## 2021-05-11 ENCOUNTER — Other Ambulatory Visit: Payer: Self-pay | Admitting: Interventional Cardiology

## 2021-05-18 DIAGNOSIS — R972 Elevated prostate specific antigen [PSA]: Secondary | ICD-10-CM | POA: Diagnosis not present

## 2021-05-18 DIAGNOSIS — N5201 Erectile dysfunction due to arterial insufficiency: Secondary | ICD-10-CM | POA: Diagnosis not present

## 2021-05-18 DIAGNOSIS — R339 Retention of urine, unspecified: Secondary | ICD-10-CM | POA: Diagnosis not present

## 2021-05-18 DIAGNOSIS — N401 Enlarged prostate with lower urinary tract symptoms: Secondary | ICD-10-CM | POA: Diagnosis not present

## 2021-06-07 DIAGNOSIS — R338 Other retention of urine: Secondary | ICD-10-CM | POA: Diagnosis not present

## 2021-06-07 DIAGNOSIS — Z466 Encounter for fitting and adjustment of urinary device: Secondary | ICD-10-CM | POA: Diagnosis not present

## 2021-06-11 ENCOUNTER — Other Ambulatory Visit: Payer: Self-pay | Admitting: Physician Assistant

## 2021-06-29 ENCOUNTER — Encounter: Payer: Self-pay | Admitting: Emergency Medicine

## 2021-06-29 ENCOUNTER — Encounter: Payer: Self-pay | Admitting: Internal Medicine

## 2021-06-29 ENCOUNTER — Other Ambulatory Visit: Payer: Self-pay

## 2021-06-29 ENCOUNTER — Ambulatory Visit: Payer: Medicare Other | Admitting: Emergency Medicine

## 2021-06-29 ENCOUNTER — Inpatient Hospital Stay: Payer: Medicare Other

## 2021-06-29 ENCOUNTER — Inpatient Hospital Stay: Payer: Medicare Other | Attending: Internal Medicine | Admitting: Internal Medicine

## 2021-06-29 VITALS — BP 110/62 | HR 68 | Temp 98.4°F | Ht 67.0 in | Wt 166.4 lb

## 2021-06-29 VITALS — BP 144/92 | HR 67 | Temp 97.8°F | Wt 166.3 lb

## 2021-06-29 DIAGNOSIS — D649 Anemia, unspecified: Secondary | ICD-10-CM | POA: Diagnosis not present

## 2021-06-29 DIAGNOSIS — I252 Old myocardial infarction: Secondary | ICD-10-CM | POA: Insufficient documentation

## 2021-06-29 DIAGNOSIS — C8208 Follicular lymphoma grade I, lymph nodes of multiple sites: Secondary | ICD-10-CM

## 2021-06-29 DIAGNOSIS — I10 Essential (primary) hypertension: Secondary | ICD-10-CM | POA: Insufficient documentation

## 2021-06-29 DIAGNOSIS — I700 Atherosclerosis of aorta: Secondary | ICD-10-CM

## 2021-06-29 DIAGNOSIS — E785 Hyperlipidemia, unspecified: Secondary | ICD-10-CM

## 2021-06-29 DIAGNOSIS — Z79620 Long term (current) use of immunosuppressive biologic: Secondary | ICD-10-CM | POA: Diagnosis not present

## 2021-06-29 DIAGNOSIS — I2510 Atherosclerotic heart disease of native coronary artery without angina pectoris: Secondary | ICD-10-CM | POA: Insufficient documentation

## 2021-06-29 DIAGNOSIS — Z79899 Other long term (current) drug therapy: Secondary | ICD-10-CM | POA: Diagnosis not present

## 2021-06-29 DIAGNOSIS — R599 Enlarged lymph nodes, unspecified: Secondary | ICD-10-CM | POA: Diagnosis not present

## 2021-06-29 LAB — CBC WITH DIFFERENTIAL (CANCER CENTER ONLY)
Abs Immature Granulocytes: 0.01 10*3/uL (ref 0.00–0.07)
Basophils Absolute: 0.1 10*3/uL (ref 0.0–0.1)
Basophils Relative: 1 %
Eosinophils Absolute: 0.3 10*3/uL (ref 0.0–0.5)
Eosinophils Relative: 6 %
HCT: 37.5 % — ABNORMAL LOW (ref 39.0–52.0)
Hemoglobin: 12.2 g/dL — ABNORMAL LOW (ref 13.0–17.0)
Immature Granulocytes: 0 %
Lymphocytes Relative: 34 %
Lymphs Abs: 1.6 10*3/uL (ref 0.7–4.0)
MCH: 28.4 pg (ref 26.0–34.0)
MCHC: 32.5 g/dL (ref 30.0–36.0)
MCV: 87.2 fL (ref 80.0–100.0)
Monocytes Absolute: 0.7 10*3/uL (ref 0.1–1.0)
Monocytes Relative: 15 %
Neutro Abs: 2.2 10*3/uL (ref 1.7–7.7)
Neutrophils Relative %: 44 %
Platelet Count: 191 10*3/uL (ref 150–400)
RBC: 4.3 MIL/uL (ref 4.22–5.81)
RDW: 15.9 % — ABNORMAL HIGH (ref 11.5–15.5)
WBC Count: 4.9 10*3/uL (ref 4.0–10.5)
nRBC: 0 % (ref 0.0–0.2)

## 2021-06-29 LAB — CMP (CANCER CENTER ONLY)
ALT: 21 U/L (ref 0–44)
AST: 23 U/L (ref 15–41)
Albumin: 3.8 g/dL (ref 3.5–5.0)
Alkaline Phosphatase: 110 U/L (ref 38–126)
Anion gap: 4 — ABNORMAL LOW (ref 5–15)
BUN: 14 mg/dL (ref 8–23)
CO2: 29 mmol/L (ref 22–32)
Calcium: 9 mg/dL (ref 8.9–10.3)
Chloride: 108 mmol/L (ref 98–111)
Creatinine: 1.21 mg/dL (ref 0.61–1.24)
GFR, Estimated: >60 mL/min (ref >60)
Glucose, Bld: 90 mg/dL (ref 70–99)
Potassium: 3.9 mmol/L (ref 3.5–5.1)
Sodium: 141 mmol/L (ref 135–145)
Total Bilirubin: 0.4 mg/dL (ref 0.3–1.2)
Total Protein: 5.7 g/dL — ABNORMAL LOW (ref 6.5–8.1)

## 2021-06-29 LAB — LACTATE DEHYDROGENASE: LDH: 176 U/L (ref 98–192)

## 2021-06-29 NOTE — Progress Notes (Signed)
Eric [name] ?81 y.o. ? ? ?Chief Complaint  ?Patient presents with  ? Follow-up  ? Labs Only  ?  Pt wants a lipid panel drawn  ? ? ?HISTORY OF PRESENT ILLNESS: ?This is a 81 y.o. male here for follow-up of hypertension and dyslipidemia and to have lipid profile done.  Off blood pressure medication at present time. ?Has history of lymphoma.  Saw Dr. Earlie Server earlier today.  Assessment and plan as follows: ?ASSESSMENT AND PLAN:  ?This is a very pleasant 81 years old African-American male with bulky stage IV follicular lymphoma initially treated with CHOP/Rituxan followed by maintenance Rituxan. The patient had disease recurrence and he was restarted again on treatment with Rituxan every 2 months status post 22 cycles. ?The patient has been tolerating this treatment well with no concerning adverse effects. ?The patient was found to have progression in December 2019.  He was started on systemic chemotherapy with bendamustine and Rituxan status post 4 cycles.   ?The patient is currently on observation and he is feeling fine today with no concerning complaints. ?Repeat CBC today showed mild anemia but otherwise normal.  Comprehensive metabolic panel and LDH are still pending. ?I recommended for the patient to continue on observation with repeat CT scan of the chest, abdomen pelvis in 6 months. ?For the anemia, he will continue on the oral iron tablets for now. ?The patient was advised to call immediately if he has any concerning symptoms in the interval. ?The patient voices understanding of current disease status and treatment options and is in agreement with the current care plan. ?All questions were answered. The patient knows to call the clinic with any problems, questions or concerns. We can certainly see the patient much sooner if necessary. ?  ? ? ? ?HPI ? ? ?Prior to Admission medications   ?Medication Sig Start Date End Date Taking? Authorizing Provider  ?aspirin EC 81 MG tablet Take 81 mg by mouth at bedtime.    Yes [provider]  ?atorvastatin (LIPITOR) 80 MG tablet TAKE 1 TABLET(80 MG) BY MOUTH DAILY 06/12/21  Yes Belva Crome, MD  ?diphenhydrAMINE (BENADRYL) 25 MG tablet Take 25 mg by mouth at bedtime as needed for sleep.    Yes [provider]  ?ferrous sulfate (FERROUSUL) 325 (65 FE) MG tablet Take 1 tablet (325 mg total) by mouth 2 (two) times daily with a meal. 08/23/20  Yes Weaver, Scott T, PA-C  ?losartan (COZAAR) 100 MG tablet TAKE 1 TABLET BY MOUTH EVERY DAY 05/11/21  Yes Belva Crome, MD  ?omeprazole (PRILOSEC) 20 MG capsule Take 20 mg by mouth daily as needed (acid relfux).   Yes [provider]  ?Polyethylene Glycol 3350 (PEG 3350) POWD Take by mouth. 02/11/18  Yes [provider]  ?sildenafil (VIAGRA) 100 MG tablet Take 100 mg by mouth daily as needed. 12/10/17  Yes [provider]  ? ? ?Allergies  ?Allergen Reactions  ? Neosporin [Neomycin-Bacitracin Zn-Polymyx] Anaphylaxis  ? ? ?Patient Active Problem List  ? Diagnosis Date Noted  ? Diverticulosis 12/29/2020  ? Atherosclerosis of aorta ([place]) 12/29/2020  ? Essential hypertension 12/29/2020  ? Herpes zoster without complication 27/25/3664  ? Hypercholesterolemia 03/25/2016  ? Follicular lymphoma grade I of lymph nodes of multiple sites ([place]) 02/16/2015  ? CAD in native artery 08/18/2014  ? Hearing loss 07/11/2012  ? Insomnia, idiopathic 07/11/2012  ? Erectile dysfunction 07/11/2012  ? Non-STEMI (non-ST elevated myocardial infarction) ([place]) 03/06/2012  ? Lymphoma ([place]) 06/05/2011  ?  Class:  Chronic  ? ? ?Past Medical History:  ?Diagnosis Date  ? Cancer [facility])   ? Coronary atherosclerosis of native coronary artery   ? a. s/p MI with DES/PCI to the RCA in 2013.  ? Encounter for antineoplastic chemotherapy 06/20/2015  ? Essential hypertension   ? GERD (gastroesophageal reflux disease)   ? History of heart attack   ? Hyperlipidemia   ? Ischemic cardiomyopathy   ? Myocardial infarction [facility])   ? Non Hodgkin's lymphoma  ([place])   ? Plavix resistance   ? Plavix hyporesponder 2013  ? Sinus bradycardia   ? baseline HR 50s at times  ? ? ?Past Surgical History:  ?Procedure Laterality Date  ? CORONARY STENT PLACEMENT    ? LEFT HEART CATHETERIZATION WITH CORONARY ANGIOGRAM N/A 03/03/2012  ? Procedure: LEFT HEART CATHETERIZATION WITH CORONARY ANGIOGRAM;  Surgeon: [name], MD;  Location: [facility] CATH LAB;  Service: Cardiovascular;  Laterality: N/A;  ? PERCUTANEOUS CORONARY STENT INTERVENTION (PCI-S) N/A 03/04/2012  ? Procedure: PERCUTANEOUS CORONARY STENT INTERVENTION (PCI-S);  Surgeon: [name], MD;  Location: [facility] CATH LAB;  Service: Cardiovascular;  Laterality: N/A;  ? ? ?Social History  ? ?Socioeconomic History  ? Marital status: Married  ?  Spouse name: Not on file  ? Number of children: Not on file  ? Years of education: Not on file  ? Highest education level: Not on file  ?Occupational History  ? Occupation: pharmacist  ?Tobacco Use  ? Smoking status: Never  ? Smokeless tobacco: Never  ?Vaping Use  ? Vaping Use: Never used  ?Substance and Sexual Activity  ? Alcohol use: Yes  ?  Comment: occasionally  ? Drug use: No  ? Sexual activity: Yes  ?Other Topics Concern  ? Not on file  ?Social History Narrative  ? Marital status: married since 21 years; from [place], [place]; two hours from [place]; below [place]  ?    Children: 2 children (30, 20); no grandchildren  ?    Lives: with wife, 2 children  ?    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   ?    Tobacco: never  ?    Alcohol:  Socially; almost none in 2017  ?    Exercise in door track, cardio machine and treadmill 1-2 times per week  ?    ADLs: independent with ADLs;   ?    Advanced Directives: no; desires FULL CODE.   ? ?Social Determinants of Health  ? ?Financial Resource Strain: Not on file  ?Food Insecurity: Not on file  ?Transportation Needs: Not on file  ?Physical Activity: Not on file  ?Stress: Not on file  ?Social  Connections: Not on file  ?Intimate Partner Violence: Not on file  ? ? ?Family History  ?Problem Relation Age of Onset  ? Leukemia Mother   ? ? ? ?Review of Systems  ?Constitutional: Negative.  Negative for chills and fever.  ?HENT: Negative.  Negative for congestion and sore throat.   ?Respiratory: Negative.  Negative for cough and shortness of breath.   ?Cardiovascular: Negative.  Negative for chest pain and palpitations.  ?Gastrointestinal:  Negative for abdominal pain, nausea and vomiting.  ?Genitourinary: Negative.   ?Skin: Negative.  Negative for rash.  ?Neurological: Negative.  Negative for dizziness and headaches.  ?All other systems reviewed and are negative. ? ?Today's Vitals  ? 06/29/21 0952  ?BP: 110/62  ?Pulse: 68  ?Temp: 98.4 ?F (36.9 ?C)  ?TempSrc: Oral  ?SpO2:  98%  ?Weight: 166 lb 6 oz (75.5 kg)  ?Height: 5\' 7"  (1.702 m)  ? ?Body mass index is 26.06 kg/m?. ? ?Physical Exam ?Vitals reviewed.  ?Constitutional:   ?   Appearance: Normal appearance.  ?HENT:  ?   Head: Normocephalic.  ?Eyes:  ?   Extraocular Movements: Extraocular movements intact.  ?   Pupils: Pupils are equal, round, and reactive to light.  ?Cardiovascular:  ?   Rate and Rhythm: Normal rate and regular rhythm.  ?   Pulses: Normal pulses.  ?   Heart sounds: Normal heart sounds.  ?Pulmonary:  ?   Effort: Pulmonary effort is normal.  ?   Breath sounds: Normal breath sounds.  ?Abdominal:  ?   Palpations: Abdomen is soft.  ?   Tenderness: There is no abdominal tenderness.  ?Musculoskeletal:     ?   General: Normal range of motion.  ?   Cervical back: No tenderness.  ?   Right lower leg: No edema.  ?   Left lower leg: No edema.  ?Lymphadenopathy:  ?   Cervical: No cervical adenopathy.  ?Skin: ?   General: Skin is warm and dry.  ?   Capillary Refill: Capillary refill takes less than 2 seconds.  ?Neurological:  ?   General: No focal deficit present.  ?   Mental Status: He is alert and oriented to person, place, and time.  ?Psychiatric:     ?    Mood and Affect: Mood normal.     ?   Behavior: Behavior normal.  ? ?Results for orders placed or performed in visit on 06/29/21 (from the past 24 hour(s))  ?Lactate dehydrogenase (LDH)     Status: None  ? Co

## 2021-06-29 NOTE — Assessment & Plan Note (Signed)
Well-controlled hypertension off medications. ?Was taking losartan 100 mg daily but discontinued due to hypotensive episodes. ?

## 2021-06-29 NOTE — Assessment & Plan Note (Signed)
Stable.  Dr. Lew Dawes office visit notes reviewed today. ?

## 2021-06-29 NOTE — Patient Instructions (Signed)
Health Maintenance After Age 81 After age 81, you are at a higher risk for certain long-term diseases and infections as well as injuries from falls. Falls are a major cause of broken bones and head injuries in people who are older than age 81. Getting regular preventive care can help to keep you healthy and well. Preventive care includes getting regular testing and making lifestyle changes as recommended by your health care provider. Talk with your health care provider about: Which screenings and tests you should have. A screening is a test that checks for a disease when you have no symptoms. A diet and exercise plan that is right for you. What should I know about screenings and tests to prevent falls? Screening and testing are the best ways to find a health problem early. Early diagnosis and treatment give you the best chance of managing medical conditions that are common after age 81. Certain conditions and lifestyle choices may make you more likely to have a fall. Your health care provider may recommend: Regular vision checks. Poor vision and conditions such as cataracts can make you more likely to have a fall. If you wear glasses, make sure to get your prescription updated if your vision changes. Medicine review. Work with your health care provider to regularly review all of the medicines you are taking, including over-the-counter medicines. Ask your health care provider about any side effects that may make you more likely to have a fall. Tell your health care provider if any medicines that you take make you feel dizzy or sleepy. Strength and balance checks. Your health care provider may recommend certain tests to check your strength and balance while standing, walking, or changing positions. Foot health exam. Foot pain and numbness, as well as not wearing proper footwear, can make you more likely to have a fall. Screenings, including: Osteoporosis screening. Osteoporosis is a condition that causes  the bones to get weaker and break more easily. Blood pressure screening. Blood pressure changes and medicines to control blood pressure can make you feel dizzy. Depression screening. You may be more likely to have a fall if you have a fear of falling, feel depressed, or feel unable to do activities that you used to do. Alcohol use screening. Using too much alcohol can affect your balance and may make you more likely to have a fall. Follow these instructions at home: Lifestyle Do not drink alcohol if: Your health care provider tells you not to drink. If you drink alcohol: Limit how much you have to: 0-1 drink a day for women. 0-2 drinks a day for men. Know how much alcohol is in your drink. In the [place] one drink equals one 12 oz bottle of beer (355 mL), one 5 oz glass of wine (148 mL), or one 1 oz glass of hard liquor (44 mL). Do not use any products that contain nicotine or tobacco. These products include cigarettes, chewing tobacco, and vaping devices, such as e-cigarettes. If you need help quitting, ask your health care provider. Activity  Follow a regular exercise program to stay fit. This will help you maintain your balance. Ask your health care provider what types of exercise are appropriate for you. If you need a cane or walker, use it as recommended by your health care provider. Wear supportive shoes that have nonskid soles. Safety  Remove any tripping hazards, such as rugs, cords, and clutter. Install safety equipment such as grab bars in bathrooms and safety rails on stairs. Keep rooms and walkways   well-lit. General instructions Talk with your health care provider about your risks for falling. Tell your health care provider if: You fall. Be sure to tell your health care provider about all falls, even ones that seem minor. You feel dizzy, tiredness (fatigue), or off-balance. Take over-the-counter and prescription medicines only as told by your health care provider. These include  supplements. Eat a healthy diet and maintain a healthy weight. A healthy diet includes low-fat dairy products, low-fat (lean) meats, and fiber from whole grains, beans, and lots of fruits and vegetables. Stay current with your vaccines. Schedule regular health, dental, and eye exams. Summary Having a healthy lifestyle and getting preventive care can help to protect your health and wellness after age 81. Screening and testing are the best way to find a health problem early and help you avoid having a fall. Early diagnosis and treatment give you the best chance for managing medical conditions that are more common for people who are older than age 81. Falls are a major cause of broken bones and head injuries in people who are older than age 81. Take precautions to prevent a fall at home. Work with your health care provider to learn what changes you can make to improve your health and wellness and to prevent falls. This information is not intended to replace advice given to you by your health care provider. Make sure you discuss any questions you have with your health care provider. Document Revised: 07/11/2020 Document Reviewed: 07/11/2020 Elsevier Patient Education  2023 [employer] Inc[employer]  

## 2021-06-29 NOTE — Progress Notes (Signed)
?    [place] ?[phone]   [phone] ? ?OFFICE PROGRESS NOTE ? ?DIAGNOSIS: Bulky stage IV low-grade lymphoma diagnosed in November 2009. ? ?PRIOR THERAPY: ?Status post 7 cycles of systemic chemotherapy with CHOP/Rituxan. Last dose was given May 2010. ?Status post 6 cycles of maintenance Rituxan 375 mg/sq m given every 2 months. Last dose was given on May 29, 2010, and the patient was lost to followup at that time. He received another dose on 12/11/2010, then again missed 2 doses. ?systemic chemotherapy with Rituxan 375 mg/M2 on day 1 and that bendamustine 90 mg/M2 on days 1 and 2 every 4 weeks, status post 6 cycles. ?Maintenance Rituxan 375 mg/M2 every 2 months, status post 12 cycles  ?Rituxan 375 MG/M2 given weekly for 4 weeks and this is followed by maintenance treatment every 2 months, status post 22 cycles discontinued secondary to disease progression. ?Systemic chemotherapy with bendamustine 90 mg/M2 on days 1 and 2 and Rituxan 375 mg/M2 on day 1 every 4 weeks.  First dose March 03, 2018.  Status post 4 cycles. ? ?CURRENT THERAPY: Observation. ? ?INTERVAL HISTORY: ?Eric [name] 81 y.o. male returns to the clinic today for 43-month follow-up visit.  The patient is feeling fine today with no concerning complaints.  He denied having any fatigue or weakness.  He denied having any chest pain, shortness of breath, cough or hemoptysis.  He has no nausea, vomiting, diarrhea or constipation.  He has no headache or visual changes.  He has no palpable lymphadenopathy.  He has no bleeding, bruises or ecchymosis.  He is here today for evaluation and repeat blood work. ? ?MEDICAL HISTORY: ?Past Medical History:  ?Diagnosis Date  ? Cancer [facility])   ? Coronary atherosclerosis of native coronary artery   ? a. s/p MI with DES/PCI to the RCA in 2013.  ? Encounter for antineoplastic chemotherapy 06/20/2015  ? Essential hypertension   ? GERD (gastroesophageal reflux disease)   ? History  of heart attack   ? Hyperlipidemia   ? Ischemic cardiomyopathy   ? Myocardial infarction [facility])   ? Non Hodgkin's lymphoma ([place])   ? Plavix resistance   ? Plavix hyporesponder 2013  ? Sinus bradycardia   ? baseline HR 50s at times  ? ? ?ALLERGIES:  is allergic to neosporin [neomycin-bacitracin zn-polymyx]. ? ?MEDICATIONS:  ?Current Outpatient Medications  ?Medication Sig Dispense Refill  ? aspirin EC 81 MG tablet Take 81 mg by mouth at bedtime.    ? atorvastatin (LIPITOR) 80 MG tablet TAKE 1 TABLET(80 MG) BY MOUTH DAILY 90 tablet 3  ? diphenhydrAMINE (BENADRYL) 25 MG tablet Take 25 mg by mouth at bedtime as needed for sleep.     ? ferrous sulfate (FERROUSUL) 325 (65 FE) MG tablet Take 1 tablet (325 mg total) by mouth 2 (two) times daily with a meal. 180 tablet 3  ? losartan (COZAAR) 100 MG tablet TAKE 1 TABLET BY MOUTH EVERY DAY 90 tablet 3  ? omeprazole (PRILOSEC) 20 MG capsule Take 20 mg by mouth daily as needed (acid relfux).    ? Polyethylene Glycol 3350 (PEG 3350) POWD Take by mouth.    ? sildenafil (VIAGRA) 100 MG tablet Take 100 mg by mouth daily as needed.  3  ? ?No current facility-administered medications for this visit.  ? ? ?SURGICAL HISTORY:  ?Past Surgical History:  ?Procedure Laterality Date  ? CORONARY STENT PLACEMENT    ? LEFT HEART CATHETERIZATION WITH CORONARY ANGIOGRAM N/A 03/03/2012  ? Procedure:  LEFT HEART CATHETERIZATION WITH CORONARY ANGIOGRAM;  Surgeon: [name], MD;  Location: [facility] CATH LAB;  Service: Cardiovascular;  Laterality: N/A;  ? PERCUTANEOUS CORONARY STENT INTERVENTION (PCI-S) N/A 03/04/2012  ? Procedure: PERCUTANEOUS CORONARY STENT INTERVENTION (PCI-S);  Surgeon: [name], MD;  Location: [facility] CATH LAB;  Service: Cardiovascular;  Laterality: N/A;  ? ? ?REVIEW OF SYSTEMS:  A comprehensive review of systems was negative.  ? ?PHYSICAL EXAMINATION: General appearance: alert, cooperative, and no distress ?Head: Normocephalic, without obvious abnormality, atraumatic ?Neck: no  adenopathy, no JVD, supple, symmetrical, trachea midline, and thyroid not enlarged, symmetric, no tenderness/mass/nodules ?Lymph nodes: Cervical, supraclavicular, and axillary nodes normal. ?Resp: clear to auscultation bilaterally ?Back: symmetric, no curvature. ROM normal. No CVA tenderness. ?Cardio: regular rate and rhythm, S1, S2 normal, no murmur, click, rub or gallop ?GI: soft, non-tender; bowel sounds normal; no masses,  no organomegaly ?Extremities: extremities normal, atraumatic, no cyanosis or edema ? ?ECOG PERFORMANCE STATUS: 0 - Asymptomatic ? ?Blood pressure (!) 144/92, pulse 67, temperature 97.8 ?F (36.6 ?C), temperature source Tympanic, weight 166 lb 4.8 oz (75.4 kg), SpO2 96 %. ? ?LABORATORY DATA: ?Lab Results  ?Component Value Date  ? WBC 4.9 06/29/2021  ? HGB 12.2 (L) 06/29/2021  ? HCT 37.5 (L) 06/29/2021  ? MCV 87.2 06/29/2021  ? PLT 191 06/29/2021  ? ? ?  Chemistry   ?   ?Component Value Date/Time  ? NA 141 12/27/2020 1346  ? NA 141 08/19/2020 0957  ? NA 140 02/14/2017 0804  ? K 4.5 12/27/2020 1346  ? K 4.4 02/14/2017 0804  ? CL 106 12/27/2020 1346  ? CL 106 08/06/2012 1244  ? CO2 25 12/27/2020 1346  ? CO2 22 02/14/2017 0804  ? BUN 20 12/27/2020 1346  ? BUN 20 08/19/2020 0957  ? BUN 14.8 02/14/2017 0804  ? CREATININE 1.27 (H) 12/27/2020 1346  ? CREATININE 1.1 02/14/2017 0804  ?    ?Component Value Date/Time  ? CALCIUM 9.6 12/27/2020 1346  ? CALCIUM 9.2 02/14/2017 0804  ? ALKPHOS 117 12/27/2020 1346  ? ALKPHOS 138 02/14/2017 0804  ? AST 30 12/27/2020 1346  ? AST 29 02/14/2017 0804  ? ALT 24 12/27/2020 1346  ? ALT 27 02/14/2017 0804  ? BILITOT 0.7 12/27/2020 1346  ? BILITOT 0.61 02/14/2017 0804  ?  ? ? ? ?RADIOGRAPHIC STUDIES: ?No results found. ? ?ASSESSMENT AND PLAN:  ?This is a very pleasant 81 years old African-American male with bulky stage IV follicular lymphoma initially treated with CHOP/Rituxan followed by maintenance Rituxan. The patient had disease recurrence and he was restarted again on  treatment with Rituxan every 2 months status post 22 cycles. ?The patient has been tolerating this treatment well with no concerning adverse effects. ?The patient was found to have progression in December 2019.  He was started on systemic chemotherapy with bendamustine and Rituxan status post 4 cycles.   ?The patient is currently on observation and he is feeling fine today with no concerning complaints. ?Repeat CBC today showed mild anemia but otherwise normal.  Comprehensive metabolic panel and LDH are still pending. ?I recommended for the patient to continue on observation with repeat CT scan of the chest, abdomen pelvis in 6 months. ?For the anemia, he will continue on the oral iron tablets for now. ?The patient was advised to call immediately if he has any concerning symptoms in the interval. ?The patient voices understanding of current disease status and treatment options and is in agreement with the current care  plan. ?All questions were answered. The patient knows to call the clinic with any problems, questions or concerns. We can certainly see the patient much sooner if necessary. ? ? ?Disclaimer: This note was dictated with voice recognition software. Similar sounding words can inadvertently be transcribed and may not be corrected upon review. ? ? ?  ?  ?

## 2021-06-29 NOTE — Assessment & Plan Note (Signed)
Lipid profile done today.  Continue atorvastatin 80 mg daily. ? ?

## 2021-07-05 DIAGNOSIS — R338 Other retention of urine: Secondary | ICD-10-CM | POA: Diagnosis not present

## 2021-07-05 DIAGNOSIS — Z466 Encounter for fitting and adjustment of urinary device: Secondary | ICD-10-CM | POA: Diagnosis not present

## 2021-07-10 ENCOUNTER — Telehealth: Payer: Self-pay

## 2021-07-10 ENCOUNTER — Ambulatory Visit: Payer: Medicare Other

## 2021-07-10 NOTE — Telephone Encounter (Signed)
Called patient with no answer , may reschedule next available appointment. ? ? ?L.Havish Petties,LPN  ?

## 2021-08-09 DIAGNOSIS — Z466 Encounter for fitting and adjustment of urinary device: Secondary | ICD-10-CM | POA: Diagnosis not present

## 2021-08-09 DIAGNOSIS — R339 Retention of urine, unspecified: Secondary | ICD-10-CM | POA: Diagnosis not present

## 2021-09-04 ENCOUNTER — Other Ambulatory Visit: Payer: Self-pay | Admitting: Oncology

## 2021-09-13 DIAGNOSIS — R339 Retention of urine, unspecified: Secondary | ICD-10-CM | POA: Diagnosis not present

## 2021-09-13 DIAGNOSIS — Z466 Encounter for fitting and adjustment of urinary device: Secondary | ICD-10-CM | POA: Diagnosis not present

## 2021-10-06 ENCOUNTER — Ambulatory Visit (INDEPENDENT_AMBULATORY_CARE_PROVIDER_SITE_OTHER): Payer: Medicare Other

## 2021-10-06 DIAGNOSIS — Z0000 Encounter for general adult medical examination without abnormal findings: Secondary | ICD-10-CM

## 2021-10-06 NOTE — Patient Instructions (Signed)
It was a pleasure speaking with you today   Please follow up in 1 year

## 2021-10-06 NOTE — Progress Notes (Addendum)
Subjective:   Eric [name] is a 81 y.o. male who presents for Medicare Annual/Subsequent preventive examination.  Review of Systems           Objective:    There were no vitals filed for this visit. There is no height or weight on file to calculate BMI.     06/29/2021    8:51 AM 05/02/2020   11:50 AM 10/17/2017   12:07 PM 08/22/2017   10:09 AM 06/20/2017    9:12 AM 12/13/2016    9:12 AM 11/14/2016   10:02 PM  Advanced Directives  Does Patient Have a Medical Advance Directive? No No No No No No No  Would patient like information on creating a medical advance directive?  No - Patient declined         Current Medications (verified) Outpatient Encounter Medications as of 10/06/2021  Medication Sig   aspirin EC 81 MG tablet Take 81 mg by mouth at bedtime.   atorvastatin (LIPITOR) 80 MG tablet TAKE 1 TABLET(80 MG) BY MOUTH DAILY   diphenhydrAMINE (BENADRYL) 25 MG tablet Take 25 mg by mouth at bedtime as needed for sleep.    ferrous sulfate (FERROUSUL) 325 (65 FE) MG tablet Take 1 tablet (325 mg total) by mouth 2 (two) times daily with a meal.   losartan (COZAAR) 100 MG tablet TAKE 1 TABLET BY MOUTH EVERY DAY   omeprazole (PRILOSEC) 20 MG capsule Take 20 mg by mouth daily as needed (acid relfux).   Polyethylene Glycol 3350 (PEG 3350) POWD Take by mouth.   sildenafil (VIAGRA) 100 MG tablet Take 100 mg by mouth daily as needed.   No facility-administered encounter medications on file as of 10/06/2021.    Allergies (verified) Neosporin [neomycin-bacitracin zn-polymyx]   History: Past Medical History:  Diagnosis Date   Cancer ([place])    Coronary atherosclerosis of native coronary artery    a. s/p MI with DES/PCI to the RCA in 2013.   Encounter for antineoplastic chemotherapy 06/20/2015   Essential hypertension    GERD (gastroesophageal reflux disease)    History of heart attack    Hyperlipidemia    Ischemic cardiomyopathy    Myocardial infarction ([place])    Non Hodgkin's  lymphoma ([facility])    Plavix resistance    Plavix hyporesponder 2013   Sinus bradycardia    baseline HR 50s at times   Past Surgical History:  Procedure Laterality Date   CORONARY STENT PLACEMENT     LEFT HEART CATHETERIZATION WITH CORONARY ANGIOGRAM N/A 03/03/2012   Procedure: LEFT HEART CATHETERIZATION WITH CORONARY ANGIOGRAM;  Surgeon: Eric Furbish, MD;  Location: [facility] C[facility] LAB;  Service: Cardiovascular;  Laterality: N/A;   PERCUTANEOUS CORONARY STENT INTERVENTION (PCI-S) N/A 03/04/2012   Procedure: PERCUTANEOUS CORONARY STENT INTERVENTION (PCI-S);  Surgeon: Eric Grooms, MD;  Location: [facility] CATH LAB;  Service: Cardiovascular;  Laterality: N/A;   Family History  Problem Relation Age of Onset   Leukemia Mother    Social History   Socioeconomic History   Marital status: Married    Spouse name: Not on file   Number of children: Not on file   Years of education: Not on file   Highest education level: Not on file  Occupational History   Occupation: pharmacist  Tobacco Use   Smoking status: Never   Smokeless tobacco: Never  Vaping Use   Vaping Use: Never used  Substance and Sexual Activity   Alcohol use: Yes    Comment: occasionally   Drug use: No  Sexual activity: Yes  Other Topics Concern   Not on file  Social History Narrative   Marital status: married since 48 years; from [place], [place]; two hours from [place]; below [place]: 2 children (30, 27); no grandchildren      Lives: with wife, 2 children      Employment: Software engineer; 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       Tobacco: never      Alcohol:  Socially; almost none in 2017      Exercise in door track, cardio machine and treadmill 1-2 times per week      ADLs: independent with ADLs;       Advanced Directives: no; desires FULL CODE.    Social Determinants of Health   Financial Resource Strain: Low Risk  (10/06/2021)   Overall Financial Resource Strain (CARDIA)     Difficulty of Paying Living Expenses: Not hard at all  Food Insecurity: No Food Insecurity (10/06/2021)   Hunger Vital Sign    Worried About Running Out of Food in the Last Year: Never true    Ran Out of Food in the Last Year: Never true  Transportation Needs: No Transportation Needs (10/06/2021)   PRAPARE - Hydrologist (Medical): No    Lack of Transportation (Non-Medical): No  Physical Activity: Sufficiently Active (10/06/2021)   Exercise Vital Sign    Days of Exercise per Week: 7 days    Minutes of Exercise per Session: 60 min  Stress: No Stress Concern Present (10/06/2021)   [name]    Feeling of Stress : Not at all  Social Connections: [place] (10/06/2021)   Social Connection and Isolation Panel [NHANES]    Frequency of Communication with Friends and Family: More than three times a week    Frequency of Social Gatherings with Friends and Family: Three times a week    Attends Religious Services: More than 4 times per year    Active Member of Clubs or Organizations: Yes    Attends Music therapist: More than 4 times per year    Marital Status: Married    Tobacco Counseling Counseling given: Not Answered   Clinical Intake:  Pre-visit preparation completed: Yes  Pain : No/denies pain     BMI - recorded:  (visit was done over the phone) Diabetes: No  How often do you need to have someone help you when you read instructions, pamphlets, or other written materials from your doctor or pharmacy?: 1 - Never What is the last grade level you completed in school?: Post graduate  Diabetic?No  Interpreter Needed?: No      Activities of Daily Living    10/06/2021   11:22 AM  In your present state of health, do you have any difficulty performing the following activities:  Hearing? 0  Vision? 0  Difficulty concentrating or making decisions? 0  Walking or climbing  stairs? 0  Dressing or bathing? 0  Doing errands, shopping? 0    Patient Care Team: Eric Pollen, MD as PCP - General (Internal Medicine) Eric Crome, MD as PCP - Cardiology (Cardiology)  Indicate any recent Medical Services you may have received from other than [facility] providers in the past year (date may be approximate).     Assessment:   This is a routine wellness examination for Dev.  Hearing/Vision screen No results found.  Dietary issues  and exercise activities discussed:     Goals Addressed   None   Depression Screen    10/06/2021   11:20 AM 06/29/2021    9:56 AM 12/29/2020    3:17 PM 10/15/2019   11:37 AM 04/17/2019    2:17 PM 03/18/2019   11:45 AM 03/03/2019   11:09 AM  PHQ 2/9 Scores  PHQ - 2 Score 0 0 0 0 0 0 0  PHQ- 9 Score 0          Fall Risk    10/06/2021   11:22 AM 06/29/2021    9:56 AM 12/29/2020    3:17 PM 10/15/2019   11:37 AM 04/17/2019    2:17 PM  Fall Risk   Falls in the past year? 0 0 0 0 0  Number falls in past yr: 0  0 0 0  Injury with Fall? 0   0 0  Risk for fall due to : No Fall Risks   No Fall Risks   Follow up    Falls evaluation completed Falls evaluation completed    [place]:  Any stairs in or around the home? Yes  If so, are there any without handrails? No  Home free of loose throw rugs in walkways, pet beds, electrical cords, etc? Yes  Adequate lighting in your home to reduce risk of falls? Yes   ASSISTIVE DEVICES UTILIZED TO PREVENT FALLS:  Life alert? No  Use of a cane, walker or w/c? No  Grab bars in the bathroom? No  Shower chair or bench in shower? No  Elevated toilet seat or a handicapped toilet? No   TIMED UP AND GO:  Was the test performed? No .  Length of time to ambulate 10 feet:  sec.     Cognitive Function:        10/06/2021   11:22 AM  6CIT Screen  What Year? 0 points  What month? 0 points  What time? 0 points  Count back from 20 0 points  Months in  reverse 0 points  Repeat phrase 0 points  Total Score 0 points    Immunizations Immunization History  Administered Date(s) Administered   Fluad Quad(high Dose 65+) 03/03/2019, 12/29/2020   Hepatitis B 10/06/2009, 04/27/2010   Hepatitis B, ped/adol 10/06/2009, 04/27/2010   Influenza,inj,Quad PF,6+ Mos 02/09/2014, 01/05/2015, 02/16/2016   Influenza-Unspecified 02/09/2014, 01/05/2015, 02/16/2016   Moderna Sars-Covid-2 Vaccination 06/04/2019, 07/03/2019   Pneumococcal Conjugate-13 02/29/2016   Pneumococcal Polysaccharide-23 03/03/2019   Tdap 03/03/2019    TDAP status: Up to date  Flu Vaccine status: Up to date  Pneumococcal vaccine status: Up to date  Covid-19 vaccine status: Information provided on how to obtain vaccines.   Qualifies for Shingles Vaccine? Yes   Zostavax completed No   Shingrix Completed?: No.    Education has been provided regarding the importance of this vaccine. Patient has been advised to call insurance company to determine out of pocket expense if they have not yet received this vaccine. Advised may also receive vaccine at local pharmacy or Health Dept. Verbalized acceptance and understanding.  Screening Tests Health Maintenance  Topic Date Due   Zoster Vaccines- Shingrix (1 of 2) Never done   COVID-19 Vaccine (3 - Moderna risk series) 07/31/2019   INFLUENZA VACCINE  10/03/2021   TETANUS/TDAP  03/02/2029   Pneumonia Vaccine 77+ Years old  Completed   HPV VACCINES  Aged Out    Health Maintenance  Health Maintenance Due  Topic  Date Due   Zoster Vaccines- Shingrix (1 of 2) Never done   COVID-19 Vaccine (3 - Moderna risk series) 07/31/2019   INFLUENZA VACCINE  10/03/2021    Colorectal cancer screening: No longer required.   Lung Cancer Screening: (Low Dose CT Chest recommended if Age 73-80 years, 30 pack-year currently smoking OR have quit w/in 15years.) does not qualify.   Lung Cancer Screening Referral:   Additional Screening:  Hepatitis C  Screening: does not qualify; Completed   Vision Screening: Recommended annual ophthalmology exams for early detection of glaucoma and other disorders of the eye. Is the patient up to date with their annual eye exam?  Yes  Who is the provider or what is the name of the office in which the patient attends annual eye exams? Dr. Lindwood Coke If pt is not established with a provider, would they like to be referred to a provider to establish care? Yes .   Dental Screening: Recommended annual dental exams for proper oral hygiene  Community Resource Referral / Chronic Care Management: CRR required this visit?  No   CCM required this visit?  No      Plan:     I have personally reviewed and noted the following in the patient's chart:   Medical and social history Use of alcohol, tobacco or illicit drugs  Current medications and supplements including opioid prescriptions. Patient is not currently taking opioid prescriptions. Functional ability and status Nutritional status Physical activity Advanced directives List of other physicians Hospitalizations, surgeries, and ER visits in previous 12 months Vitals Screenings to include cognitive, depression, and falls Referrals and appointments  In addition, I have reviewed and discussed with patient certain preventive protocols, quality metrics, and best practice recommendations. A written personalized care plan for preventive services as well as general preventive health recommendations were provided to patient.     Thomes Cake, [place]   10/06/2021   Nurse Notes: No concerns at this time. I advised the patient that if anything changes to give our office a call.   I connected with  [name] on 10/09/21 by a video enabled telemedicine application and verified that I am speaking with the correct person using two identifiers.   I discussed the limitations of evaluation and management by telemedicine. The patient expressed understanding and  agreed to proceed.

## 2021-10-12 DIAGNOSIS — Z23 Encounter for immunization: Secondary | ICD-10-CM | POA: Diagnosis not present

## 2021-10-12 DIAGNOSIS — T7491XA Unspecified adult maltreatment, confirmed, initial encounter: Secondary | ICD-10-CM | POA: Diagnosis not present

## 2021-10-12 DIAGNOSIS — S50872A Other superficial bite of left forearm, initial encounter: Secondary | ICD-10-CM | POA: Diagnosis not present

## 2021-10-12 DIAGNOSIS — S21251A Open bite of right back wall of thorax without penetration into thoracic cavity, initial encounter: Secondary | ICD-10-CM | POA: Diagnosis not present

## 2021-10-12 DIAGNOSIS — S20471A Other superficial bite of right back wall of thorax, initial encounter: Secondary | ICD-10-CM | POA: Diagnosis not present

## 2021-10-12 NOTE — Progress Notes (Signed)
Subjective:   Eric [name] is a 81 y.o. male who presents for Medicare Annual/Subsequent preventive examination.  I connected with  Eric [name] on 10/06/2021 by an audio telemedicine application and verified that I am speaking with the correct person using two identifiers.   Location of patient: Home Location of provider: Clinic Present during assessment: Eric [name] and Eric [name]  I discussed the limitations of evaluation and management by telemedicine. The patient expressed understanding and agreed to proceed.   Review of Systems: Defer to PCPCardiac Risk Factors include: advanced age (>61men, >79 women);male gender;dyslipidemia;hypertension;sedentary lifestyle     Objective:    There were no vitals filed for this visit. There is no height or weight on file to calculate BMI.     10/12/2021    3:41 PM 06/29/2021    8:51 AM 05/02/2020   11:50 AM 10/17/2017   12:07 PM 08/22/2017   10:09 AM 06/20/2017    9:12 AM 12/13/2016    9:12 AM  Advanced Directives  Does Patient Have a Medical Advance Directive? No No No No No No No  Would patient like information on creating a medical advance directive? No - Patient declined  No - Patient declined        Current Medications (verified) Outpatient Encounter Medications as of 10/06/2021  Medication Sig   aspirin EC 81 MG tablet Take 81 mg by mouth at bedtime.   atorvastatin (LIPITOR) 80 MG tablet TAKE 1 TABLET(80 MG) BY MOUTH DAILY   diphenhydrAMINE (BENADRYL) 25 MG tablet Take 25 mg by mouth at bedtime as needed for sleep.    ferrous sulfate (FERROUSUL) 325 (65 FE) MG tablet Take 1 tablet (325 mg total) by mouth 2 (two) times daily with a meal.   losartan (COZAAR) 100 MG tablet TAKE 1 TABLET BY MOUTH EVERY DAY   omeprazole (PRILOSEC) 20 MG capsule Take 20 mg by mouth daily as needed (acid relfux).   Polyethylene Glycol 3350 (PEG 3350) POWD Take by mouth.   sildenafil (VIAGRA) 100 MG tablet Take 100 mg by mouth daily as needed.    No facility-administered encounter medications on file as of 10/06/2021.    Allergies (verified) Neosporin [neomycin-bacitracin zn-polymyx]   History: Past Medical History:  Diagnosis Date   Cancer ([place])    Coronary atherosclerosis of native coronary artery    a. s/p MI with DES/PCI to the RCA in 2013.   Encounter for antineoplastic chemotherapy 06/20/2015   Essential hypertension    GERD (gastroesophageal reflux disease)    History of heart attack    Hyperlipidemia    Ischemic cardiomyopathy    Myocardial infarction ([place])    Non Hodgkin's lymphoma (HCC)    Plavix resistance    Plavix hyporesponder 2013   Sinus bradycardia    baseline HR 50s at times   Past Surgical History:  Procedure Laterality Date   CORONARY STENT PLACEMENT     LEFT HEART CATHETERIZATION WITH CORONARY ANGIOGRAM N/A 03/03/2012   Procedure: LEFT HEART CATHETERIZATION WITH CORONARY ANGIOGRAM;  Surgeon: [name], MD;  Location[facility] C[facility] LAB;  Service: Cardiovascular;  Laterality: N/A;   PERCUTANEOUS CORONARY STENT INTERVENTION (PCI-S) N/A 03/04/2012   Procedure: PERCUTANEOUS CORONARY STENT INTERVENTION (PCI-S);  Surgeon: [name], MD;  Location[facility] C[facility] LAB;  Service: Cardiovascular;  Laterality: N/A;   Family History  Problem Relation Age of Onset   Leukemia Mother    Social History   Socioeconomic History   Marital status: Married    Spouse name:  Not on file   Number of children: Not on file   Years of education: Not on file   Highest education level: Not on file  Occupational History   Occupation: pharmacist  Tobacco Use   Smoking status: Never   Smokeless tobacco: Never  Vaping Use   Vaping Use: Never used  Substance and Sexual Activity   Alcohol use: Yes    Comment: occasionally   Drug use: No   Sexual activity: Yes  Other Topics Concern   Not on file  Social History Narrative   Marital status: married since 35 years; from [name]; two hours from [place];  below [place]: 2 children (30, 27); no grandchildren      Lives: with wife, 2 children      Employment: Software engineer; independent Restaurant manager, 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       Tobacco: never      Alcohol:  Socially; almost none in 2017      Exercise in door track, cardio machine and treadmill 1-2 times per week      ADLs: independent with ADLs;       Advanced Directives: no; desires FULL CODE.    Social Determinants of Health   Financial Resource Strain: Low Risk  (10/06/2021)   Overall Financial Resource Strain (CARDIA)    Difficulty of Paying Living Expenses: Not hard at all  Food Insecurity: No Food Insecurity (10/06/2021)   Hunger Vital Sign    Worried About Running Out of Food in the Last Year: Never true    Ran Out of Food in the Last Year: Never true  Transportation Needs: No Transportation Needs (10/06/2021)   PRAPARE - Hydrologist (Medical): No    Lack of Transportation (Non-Medical): No  Physical Activity: Sufficiently Active (10/06/2021)   Exercise Vital Sign    Days of Exercise per Week: 7 days    Minutes of Exercise per Session: 60 min  Stress: No Stress Concern Present (10/06/2021)   [place]    Feeling of Stress : Not at all  Social Connections: [place] (10/06/2021)   Social Connection and Isolation Panel [NHANES]    Frequency of Communication with Friends and Family: More than three times a week    Frequency of Social Gatherings with Friends and Family: Three times a week    Attends Religious Services: More than 4 times per year    Active Member of Clubs or Organizations: Yes    Attends Music therapist: More than 4 times per year    Marital Status: Married    Tobacco Counseling Counseling given: Not Answered   Clinical Intake:  Pre-visit preparation completed: Yes  Pain : No/denies pain     BMI - recorded:  (visit  was done over the phone) Diabetes: No  How often do you need to have someone help you when you read instructions, pamphlets, or other written materials from your doctor or pharmacy?: 1 - Never What is the last grade level you completed in school?: Post graduate  Diabetic?No  Interpreter Needed?: No      Activities of Daily Living    10/12/2021    3:42 PM 10/06/2021   11:22 AM  In your present state of health, do you have any difficulty performing the following activities:  Hearing? 0 0  Vision? 0 0  Difficulty concentrating or making decisions? 0 0  Walking or climbing stairs? 1 0  Dressing or bathing? 0 0  Doing errands, shopping? 0 0  Preparing Food and eating ? N   Using the Toilet? N   In the past six months, have you accidently leaked urine? N   Do you have problems with loss of bowel control? N   Managing your Medications? N   Managing your Finances? N   Housekeeping or managing your Housekeeping? N     Patient Care Team: Horald Pollen, MD as PCP - General (Internal Medicine) Belva Crome, MD as PCP - Cardiology (Cardiology)  Indicate any recent Medical Services you may have received from other than [facility] providers in the past year (date may be approximate).     Assessment:   This is a routine wellness examination for [place].  Hearing/Vision screen No results found. Current Exercise Habits: The patient does not participate in regular exercise at present, Exercise limited by: orthopedic condition(s)  Depression Screen    10/06/2021   11:20 AM 06/29/2021    9:56 AM 12/29/2020    3:17 PM 10/15/2019   11:37 AM 04/17/2019    2:17 PM 03/18/2019   11:45 AM 03/03/2019   11:09 AM  PHQ 2/9 Scores  PHQ - 2 Score 0 0 0 0 0 0 0  PHQ- 9 Score 0          Fall Risk    10/06/2021   11:22 AM 06/29/2021    9:56 AM 12/29/2020    3:17 PM 10/15/2019   11:37 AM 04/17/2019    2:17 PM  Fall Risk   Falls in the past year? 0 0 0 0 0  Number falls in past yr: 0  0 0 0   Injury with Fall? 0   0 0  Risk for fall due to : No Fall Risks   No Fall Risks   Follow up    Falls evaluation completed Falls evaluation completed    [place]:  Any stairs in or around the home? Yes  If so, are there any without handrails? No  Home free of loose throw rugs in walkways, pet beds, electrical cords, etc? Yes  Adequate lighting in your home to reduce risk of falls? Yes   ASSISTIVE DEVICES UTILIZED TO PREVENT FALLS:  Life alert? No  Use of a cane, walker or w/c? No  Grab bars in the bathroom? No  Shower chair or bench in shower? No  Elevated toilet seat or a handicapped toilet? No   TIMED UP AND GO:  Was the test performed? No .  Length of time to ambulate 10 feet:  sec.     Cognitive Function:        10/06/2021   11:22 AM  6CIT Screen  What Year? 0 points  What month? 0 points  What time? 0 points  Count back from 20 0 points  Months in reverse 0 points  Repeat phrase 0 points  Total Score 0 points    Immunizations Immunization History  Administered Date(s) Administered   Fluad Quad(high Dose 65+) 03/03/2019, 12/29/2020   Hepatitis B 10/06/2009, 04/27/2010   Hepatitis B, ped/adol 10/06/2009, 04/27/2010   Influenza,inj,Quad PF,6+ Mos 02/09/2014, 01/05/2015, 02/16/2016   Influenza-Unspecified 02/09/2014, 01/05/2015, 02/16/2016   Moderna Sars-Covid-2 Vaccination 06/04/2019, 07/03/2019   Pneumococcal Conjugate-13 02/29/2016   Pneumococcal Polysaccharide-23 03/03/2019   Tdap 03/03/2019    TDAP status: Up to date  Flu Vaccine status: Up to date  Pneumococcal  vaccine status: Up to date  Covid-19 vaccine status: Information provided on how to obtain vaccines.   Qualifies for Shingles Vaccine? Yes   Zostavax completed No   Shingrix Completed?: No.    Education has been provided regarding the importance of this vaccine. Patient has been advised to call insurance company to determine out of pocket expense if  they have not yet received this vaccine. Advised may also receive vaccine at local pharmacy or Health Dept. Verbalized acceptance and understanding.  Screening Tests Health Maintenance  Topic Date Due   Zoster Vaccines- Shingrix (1 of 2) Never done   COVID-19 Vaccine (3 - Moderna risk series) 07/31/2019   INFLUENZA VACCINE  10/03/2021   TETANUS/TDAP  10/13/2031   Pneumonia Vaccine 63+ Years old  Completed   HPV VACCINES  Aged Out    Health Maintenance  Health Maintenance Due  Topic Date Due   Zoster Vaccines- Shingrix (1 of 2) Never done   COVID-19 Vaccine (3 - Moderna risk series) 07/31/2019   INFLUENZA VACCINE  10/03/2021    Colorectal cancer screening: No longer required.   Lung Cancer Screening: (Low Dose CT Chest recommended if Age 46-80 years, 30 pack-year currently smoking OR have quit w/in 15years.) does not qualify.   Lung Cancer Screening Referral:   Additional Screening:  Hepatitis C Screening: does not qualify; Completed   Vision Screening: Recommended annual ophthalmology exams for early detection of glaucoma and other disorders of the eye. Is the patient up to date with their annual eye exam?  Yes  Who is the provider or what is the name of the office in which the patient attends annual eye exams? Dr. Lindwood Coke If pt is not established with a provider, would they like to be referred to a provider to establish care? Yes .   Dental Screening: Recommended annual dental exams for proper oral hygiene  Community Resource Referral / Chronic Care Management: CRR required this visit?  No   CCM required this visit?  No      Plan:     I have personally reviewed and noted the following in the patient's chart:   Medical and social history Use of alcohol, tobacco or illicit drugs  Current medications and supplements including opioid prescriptions. Patient is not currently taking opioid prescriptions. Functional ability and status Nutritional status Physical  activity Advanced directives List of other physicians Hospitalizations, surgeries, and ER visits in previous 12 months Vitals Screenings to include cognitive, depression, and falls Referrals and appointments  In addition, I have reviewed and discussed with patient certain preventive protocols, quality metrics, and best practice recommendations. A written personalized care plan for preventive services as well as general preventive health recommendations were provided to patient.    Eric [name], [place]   10/06/2021 Henrene Dodge, RN   10/12/2021  amend  Nurse Notes: No concerns at this time. I advised the patient that if anything changes to give our office a call.   I connected with  Eric [name] on 10/06/2021 by a video enabled telemedicine application and verified that I am speaking with the correct person using two identifiers.   I discussed the limitations of evaluation and management by telemedicine. The patient expressed understanding and agreed to proceed.

## 2021-10-16 DIAGNOSIS — Z978 Presence of other specified devices: Secondary | ICD-10-CM | POA: Diagnosis not present

## 2021-10-16 DIAGNOSIS — Z466 Encounter for fitting and adjustment of urinary device: Secondary | ICD-10-CM | POA: Diagnosis not present

## 2021-10-17 ENCOUNTER — Telehealth: Payer: Self-pay | Admitting: Internal Medicine

## 2021-10-17 NOTE — Progress Notes (Signed)
Subjective:   Eric [name] is a 81 y.o. male who presents for Medicare Annual/Subsequent preventive examination.  I connected with  Eric [name] on 10/06/2021 by an audio telemedicine application and verified that I am speaking with the correct person using two identifiers.   Location of patient: Home Location of provider: Clinic Present during assessment: Revan Gendron and Maryelizabeth Kaufmann  I discussed the limitations of evaluation and management by telemedicine. The patient expressed understanding and agreed to proceed.   Review of Systems: Defer to PCPCardiac Risk Factors include: advanced age (>62men, >33 women);male gender;dyslipidemia;hypertension;sedentary lifestyle     Objective:    There were no vitals filed for this visit. There is no height or weight on file to calculate BMI.     10/12/2021    3:41 PM 06/29/2021    8:51 AM 05/02/2020   11:50 AM 10/17/2017   12:07 PM 08/22/2017   10:09 AM 06/20/2017    9:12 AM 12/13/2016    9:12 AM  Advanced Directives  Does Patient Have a Medical Advance Directive? No No No No No No No  Would patient like information on creating a medical advance directive? No - Patient declined  No - Patient declined        Current Medications (verified) Outpatient Encounter Medications as of 10/06/2021  Medication Sig   aspirin EC 81 MG tablet Take 81 mg by mouth at bedtime.   atorvastatin (LIPITOR) 80 MG tablet TAKE 1 TABLET(80 MG) BY MOUTH DAILY   diphenhydrAMINE (BENADRYL) 25 MG tablet Take 25 mg by mouth at bedtime as needed for sleep.    ferrous sulfate (FERROUSUL) 325 (65 FE) MG tablet Take 1 tablet (325 mg total) by mouth 2 (two) times daily with a meal.   losartan (COZAAR) 100 MG tablet TAKE 1 TABLET BY MOUTH EVERY DAY   omeprazole (PRILOSEC) 20 MG capsule Take 20 mg by mouth daily as needed (acid relfux).   Polyethylene Glycol 3350 (PEG 3350) POWD Take by mouth.   sildenafil (VIAGRA) 100 MG tablet Take 100 mg by mouth daily as needed.    No facility-administered encounter medications on file as of 10/06/2021.    Allergies (verified) Neosporin [neomycin-bacitracin zn-polymyx]   History: Past Medical History:  Diagnosis Date   Cancer ([place])    Coronary atherosclerosis of native coronary artery    a. s/p MI with DES/PCI to the RCA in 2013.   Encounter for antineoplastic chemotherapy 06/20/2015   Essential hypertension    GERD (gastroesophageal reflux disease)    History of heart attack    Hyperlipidemia    Ischemic cardiomyopathy    Myocardial infarction ([place])    Non Hodgkin's lymphoma (HCC)    Plavix resistance    Plavix hyporesponder 2013   Sinus bradycardia    baseline HR 50s at times   Past Surgical History:  Procedure Laterality Date   CORONARY STENT PLACEMENT     LEFT HEART CATHETERIZATION WITH CORONARY ANGIOGRAM N/A 03/03/2012   Procedure: LEFT HEART CATHETERIZATION WITH CORONARY ANGIOGRAM;  Surgeon: [name], MD;  Location: [facility] C[facility] LAB;  Service: Cardiovascular;  Laterality: N/A;   PERCUTANEOUS CORONARY STENT INTERVENTION (PCI-S) N/A 03/04/2012   Procedure: PERCUTANEOUS CORONARY STENT INTERVENTION (PCI-S);  Surgeon: [name], MD;  Location: [facility] CATH LAB;  Service: Cardiovascular;  Laterality: N/A;   Family History  Problem Relation Age of Onset   Leukemia Mother    Social History   Socioeconomic History   Marital status: Married    Spouse name:  Not on file   Number of children: Not on file   Years of education: Not on file   Highest education level: Not on file  Occupational History   Occupation: pharmacist  Tobacco Use   Smoking status: Never   Smokeless tobacco: Never  Vaping Use   Vaping Use: Never used  Substance and Sexual Activity   Alcohol use: Yes    Comment: occasionally   Drug use: No   Sexual activity: Yes  Other Topics Concern   Not on file  Social History Narrative   Marital status: married since 77 years; from [place], [place]; two hours from [place];  below [place]: 2 children (30, 27); no grandchildren      Lives: with wife, 2 children      Employment: Software engineer; independent Restaurant manager, 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       Tobacco: never      Alcohol:  Socially; almost none in 2017      Exercise in door track, cardio machine and treadmill 1-2 times per week      ADLs: independent with ADLs;       Advanced Directives: no; desires FULL CODE.    Social Determinants of Health   Financial Resource Strain: Low Risk  (10/06/2021)   Overall Financial Resource Strain (CARDIA)    Difficulty of Paying Living Expenses: Not hard at all  Food Insecurity: No Food Insecurity (10/06/2021)   Hunger Vital Sign    Worried About Running Out of Food in the Last Year: Never true    Ran Out of Food in the Last Year: Never true  Transportation Needs: No Transportation Needs (10/06/2021)   PRAPARE - Hydrologist (Medical): No    Lack of Transportation (Non-Medical): No  Physical Activity: Sufficiently Active (10/06/2021)   Exercise Vital Sign    Days of Exercise per Week: 7 days    Minutes of Exercise per Session: 60 min  Stress: No Stress Concern Present (10/06/2021)   [place]    Feeling of Stress : Not at all  Social Connections: [place] (10/06/2021)   Social Connection and Isolation Panel [NHANES]    Frequency of Communication with Friends and Family: More than three times a week    Frequency of Social Gatherings with Friends and Family: Three times a week    Attends Religious Services: More than 4 times per year    Active Member of Clubs or Organizations: Yes    Attends Music therapist: More than 4 times per year    Marital Status: Married    Tobacco Counseling Counseling given: Not Answered   Clinical Intake:  Pre-visit preparation completed: Yes  Pain : No/denies pain     BMI - recorded:  (visit  was done over the phone) Diabetes: No  How often do you need to have someone help you when you read instructions, pamphlets, or other written materials from your doctor or pharmacy?: 1 - Never What is the last grade level you completed in school?: Post graduate  Diabetic?No  Interpreter Needed?: No      Activities of Daily Living    10/12/2021    3:42 PM 10/06/2021   11:22 AM  In your present state of health, do you have any difficulty performing the following activities:  Hearing? 0 0  Vision? 0 0  Difficulty concentrating or making decisions? 0 0  Walking or climbing stairs? 1 0  Dressing or bathing? 0 0  Doing errands, shopping? 0 0  Preparing Food and eating ? N   Using the Toilet? N   In the past six months, have you accidently leaked urine? N   Do you have problems with loss of bowel control? N   Managing your Medications? N   Managing your Finances? N   Housekeeping or managing your Housekeeping? N     Patient Care Team: Horald Pollen, MD as PCP - General (Internal Medicine) Belva Crome, MD as PCP - Cardiology (Cardiology)  Indicate any recent Medical Services you may have received from other than [facility] providers in the past year (date may be approximate).     Assessment:   This is a routine wellness examination for Eric [name].  Hearing/Vision screen No results found. Current Exercise Habits: The patient does not participate in regular exercise at present, Exercise limited by: orthopedic condition(s)  Depression Screen    10/06/2021   11:20 AM 06/29/2021    9:56 AM 12/29/2020    3:17 PM 10/15/2019   11:37 AM 04/17/2019    2:17 PM 03/18/2019   11:45 AM 03/03/2019   11:09 AM  PHQ 2/9 Scores  PHQ - 2 Score 0 0 0 0 0 0 0  PHQ- 9 Score 0          Fall Risk    10/06/2021   11:22 AM 06/29/2021    9:56 AM 12/29/2020    3:17 PM 10/15/2019   11:37 AM 04/17/2019    2:17 PM  Fall Risk   Falls in the past year? 0 0 0 0 0  Number falls in past yr: 0  0 0 0   Injury with Fall? 0   0 0  Risk for fall due to : No Fall Risks   No Fall Risks   Follow up    Falls evaluation completed Falls evaluation completed    [place]:  Any stairs in or around the home? Yes  If so, are there any without handrails? No  Home free of loose throw rugs in walkways, pet beds, electrical cords, etc? Yes  Adequate lighting in your home to reduce risk of falls? Yes   ASSISTIVE DEVICES UTILIZED TO PREVENT FALLS:  Life alert? No  Use of a cane, walker or w/c? No  Grab bars in the bathroom? No  Shower chair or bench in shower? No  Elevated toilet seat or a handicapped toilet? No   TIMED UP AND GO:  Was the test performed? No .  Length of time to ambulate 10 feet:  sec.     Cognitive Function:        10/06/2021   11:22 AM  6CIT Screen  What Year? 0 points  What month? 0 points  What time? 0 points  Count back from 20 0 points  Months in reverse 0 points  Repeat phrase 0 points  Total Score 0 points    Immunizations Immunization History  Administered Date(s) Administered   Fluad Quad(high Dose 65+) 03/03/2019, 12/29/2020   Hepatitis B 10/06/2009, 04/27/2010   Hepatitis B, ped/adol 10/06/2009, 04/27/2010   Influenza,inj,Quad PF,6+ Mos 02/09/2014, 01/05/2015, 02/16/2016   Influenza-Unspecified 02/09/2014, 01/05/2015, 02/16/2016   Moderna Sars-Covid-2 Vaccination 06/04/2019, 07/03/2019   Pneumococcal Conjugate-13 02/29/2016   Pneumococcal Polysaccharide-23 03/03/2019   Tdap 03/03/2019    TDAP status: Up to date  Flu Vaccine status: Up to date  Pneumococcal  vaccine status: Up to date  Covid-19 vaccine status: Information provided on how to obtain vaccines.   Qualifies for Shingles Vaccine? Yes   Zostavax completed No   Shingrix Completed?: No.    Education has been provided regarding the importance of this vaccine. Patient has been advised to call insurance company to determine out of pocket expense if  they have not yet received this vaccine. Advised may also receive vaccine at local pharmacy or Health Dept. Verbalized acceptance and understanding.  Screening Tests Health Maintenance  Topic Date Due   Zoster Vaccines- Shingrix (1 of 2) Never done   COVID-19 Vaccine (3 - Moderna risk series) 07/31/2019   INFLUENZA VACCINE  10/03/2021   TETANUS/TDAP  10/13/2031   Pneumonia Vaccine 62+ Years old  Completed   HPV VACCINES  Aged Out    Health Maintenance  Health Maintenance Due  Topic Date Due   Zoster Vaccines- Shingrix (1 of 2) Never done   COVID-19 Vaccine (3 - Moderna risk series) 07/31/2019   INFLUENZA VACCINE  10/03/2021    Colorectal cancer screening: No longer required.   Lung Cancer Screening: (Low Dose CT Chest recommended if Age 42-80 years, 30 pack-year currently smoking OR have quit w/in 15years.) does not qualify.   Lung Cancer Screening Referral:   Additional Screening:  Hepatitis C Screening: does not qualify; Completed   Vision Screening: Recommended annual ophthalmology exams for early detection of glaucoma and other disorders of the eye. Is the patient up to date with their annual eye exam?  Yes  Who is the provider or what is the name of the office in which the patient attends annual eye exams? Dr. Lindwood Coke If pt is not established with a provider, would they like to be referred to a provider to establish care? Yes .   Dental Screening: Recommended annual dental exams for proper oral hygiene  Community Resource Referral / Chronic Care Management: CRR required this visit?  No   CCM required this visit?  No      Plan:     I have personally reviewed and noted the following in the patient's chart:   Medical and social history Use of alcohol, tobacco or illicit drugs  Current medications and supplements including opioid prescriptions. Patient is not currently taking opioid prescriptions. Functional ability and status Nutritional status Physical  activity Advanced directives List of other physicians Hospitalizations, surgeries, and ER visits in previous 12 months Vitals Screenings to include cognitive, depression, and falls Referrals and appointments  In addition, I have reviewed and discussed with patient certain preventive protocols, quality metrics, and best practice recommendations. A written personalized care plan for preventive services as well as general preventive health recommendations were provided to patient.    Maryelizabeth Kaufmann, [place]   10/06/2021 Henrene Dodge, RN   10/17/2021  amend  Nurse Notes: No concerns at this time. I advised the patient that if anything changes to give our office a call.   I connected with  Eric [name] on 10/06/2021 by a video enabled telemedicine application and verified that I am speaking with the correct person using two identifiers.   I discussed the limitations of evaluation and management by telemedicine. The patient expressed understanding and agreed to proceed.

## 2021-10-17 NOTE — Telephone Encounter (Signed)
Called patient regarding upcoming October appointment, patient is notified.   

## 2021-10-30 ENCOUNTER — Encounter: Payer: Self-pay | Admitting: *Deleted

## 2021-10-30 ENCOUNTER — Ambulatory Visit: Payer: Self-pay | Admitting: *Deleted

## 2021-10-30 ENCOUNTER — Ambulatory Visit: Payer: Self-pay | Admitting: Licensed Clinical Social Worker
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
## 2021-10-31 ENCOUNTER — Ambulatory Visit (INDEPENDENT_AMBULATORY_CARE_PROVIDER_SITE_OTHER): Payer: Medicare Other | Admitting: Emergency Medicine

## 2021-10-31 ENCOUNTER — Encounter: Payer: Self-pay | Admitting: Emergency Medicine

## 2021-10-31 VITALS — BP 138/76 | HR 89 | Temp 98.6°F | Ht 67.0 in | Wt 162.1 lb

## 2021-10-31 DIAGNOSIS — I25118 Atherosclerotic heart disease of native coronary artery with other forms of angina pectoris: Secondary | ICD-10-CM

## 2021-10-31 DIAGNOSIS — W503XXA Accidental bite by another person, initial encounter: Secondary | ICD-10-CM | POA: Diagnosis not present

## 2021-10-31 DIAGNOSIS — E785 Hyperlipidemia, unspecified: Secondary | ICD-10-CM

## 2021-10-31 DIAGNOSIS — Z8572 Personal history of non-Hodgkin lymphomas: Secondary | ICD-10-CM

## 2021-10-31 DIAGNOSIS — I10 Essential (primary) hypertension: Secondary | ICD-10-CM | POA: Diagnosis not present

## 2021-10-31 LAB — COMPREHENSIVE METABOLIC PANEL
ALT: 24 U/L (ref 0–53)
AST: 28 U/L (ref 0–37)
Albumin: 4 g/dL (ref 3.5–5.2)
Alkaline Phosphatase: 110 U/L (ref 39–117)
BUN: 16 mg/dL (ref 6–23)
CO2: 28 mEq/L (ref 19–32)
Calcium: 9.4 mg/dL (ref 8.4–10.5)
Chloride: 104 mEq/L (ref 96–112)
Creatinine, Ser: 1.38 mg/dL (ref 0.40–1.50)
GFR: 48.23 mL/min — ABNORMAL LOW (ref >60.00)
Glucose, Bld: 96 mg/dL (ref 70–99)
Potassium: 4 mEq/L (ref 3.5–5.1)
Sodium: 141 mEq/L (ref 135–145)
Total Bilirubin: 0.5 mg/dL (ref 0.2–1.2)
Total Protein: 6.4 g/dL (ref 6.0–8.3)

## 2021-10-31 LAB — LIPID PANEL
Cholesterol: 122 mg/dL (ref 0–200)
HDL: 40.6 mg/dL (ref >39.00)
LDL Cholesterol: 69 mg/dL (ref 0–99)
NonHDL: 81.19
Total CHOL/HDL Ratio: 3
Triglycerides: 61 mg/dL (ref 0.0–149.0)
VLDL: 12.2 mg/dL (ref 0.0–40.0)

## 2021-10-31 LAB — HEMOGLOBIN A1C: Hgb A1c MFr Bld: 6.2 % (ref 4.6–6.5)
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
## 2021-10-31 NOTE — Assessment & Plan Note (Signed)
Stable.  Diet and nutrition discussed. Continue atorvastatin. States 80 mg gives him more leg cramping. Advised to take 40 mg daily.

## 2021-10-31 NOTE — Patient Instructions (Signed)
Health Maintenance After Age 81 After age 81, you are at a higher risk for certain long-term diseases and infections as well as injuries from falls. Falls are a major cause of broken bones and head injuries in people who are older than age 81. Getting regular preventive care can help to keep you healthy and well. Preventive care includes getting regular testing and making lifestyle changes as recommended by your health care provider. Talk with your health care provider about: Which screenings and tests you should have. A screening is a test that checks for a disease when you have no symptoms. A diet and exercise plan that is right for you. What should I know about screenings and tests to prevent falls? Screening and testing are the best ways to find a health problem early. Early diagnosis and treatment give you the best chance of managing medical conditions that are common after age 81. Certain conditions and lifestyle choices may make you more likely to have a fall. Your health care provider may recommend: Regular vision checks. Poor vision and conditions such as cataracts can make you more likely to have a fall. If you wear glasses, make sure to get your prescription updated if your vision changes. Medicine review. Work with your health care provider to regularly review all of the medicines you are taking, including over-the-counter medicines. Ask your health care provider about any side effects that may make you more likely to have a fall. Tell your health care provider if any medicines that you take make you feel dizzy or sleepy. Strength and balance checks. Your health care provider may recommend certain tests to check your strength and balance while standing, walking, or changing positions. Foot health exam. Foot pain and numbness, as well as not wearing proper footwear, can make you more likely to have a fall. Screenings, including: Osteoporosis screening. Osteoporosis is a condition that causes  the bones to get weaker and break more easily. Blood pressure screening. Blood pressure changes and medicines to control blood pressure can make you feel dizzy. Depression screening. You may be more likely to have a fall if you have a fear of falling, feel depressed, or feel unable to do activities that you used to do. Alcohol use screening. Using too much alcohol can affect your balance and may make you more likely to have a fall. Follow these instructions at home: Lifestyle Do not drink alcohol if: Your health care provider tells you not to drink. If you drink alcohol: Limit how much you have to: 0-1 drink a day for women. 0-2 drinks a day for men. Know how much alcohol is in your drink. In the [place] one drink equals one 12 oz bottle of beer (355 mL), one 5 oz glass of wine (148 mL), or one 1 oz glass of hard liquor (44 mL). Do not use any products that contain nicotine or tobacco. These products include cigarettes, chewing tobacco, and vaping devices, such as e-cigarettes. If you need help quitting, ask your health care provider. Activity  Follow a regular exercise program to stay fit. This will help you maintain your balance. Ask your health care provider what types of exercise are appropriate for you. If you need a cane or walker, use it as recommended by your health care provider. Wear supportive shoes that have nonskid soles. Safety  Remove any tripping hazards, such as rugs, cords, and clutter. Install safety equipment such as grab bars in bathrooms and safety rails on stairs. Keep rooms and walkways   well-lit. General instructions Talk with your health care provider about your risks for falling. Tell your health care provider if: You fall. Be sure to tell your health care provider about all falls, even ones that seem minor. You feel dizzy, tiredness (fatigue), or off-balance. Take over-the-counter and prescription medicines only as told by your health care provider. These include  supplements. Eat a healthy diet and maintain a healthy weight. A healthy diet includes low-fat dairy products, low-fat (lean) meats, and fiber from whole grains, beans, and lots of fruits and vegetables. Stay current with your vaccines. Schedule regular health, dental, and eye exams. Summary Having a healthy lifestyle and getting preventive care can help to protect your health and wellness after age 81. Screening and testing are the best way to find a health problem early and help you avoid having a fall. Early diagnosis and treatment give you the best chance for managing medical conditions that are more common for people who are older than age 81. Falls are a major cause of broken bones and head injuries in people who are older than age 81. Take precautions to prevent a fall at home. Work with your health care provider to learn what changes you can make to improve your health and wellness and to prevent falls. This information is not intended to replace advice given to you by your health care provider. Make sure you discuss any questions you have with your health care provider. Document Revised: 07/11/2020 Document Reviewed: 07/11/2020 Elsevier Patient Education  2023 [employer] Inc[employer]  

## 2021-10-31 NOTE — Assessment & Plan Note (Signed)
Stable.  No recent anginal episodes. Alert Plavix. History of stent placement. Continue daily baby aspirin.

## 2021-10-31 NOTE — Assessment & Plan Note (Signed)
Uncomplicated.

## 2021-10-31 NOTE — Patient Instructions (Signed)
Visit Information  Thank you for taking time to visit with me today. Please don't hesitate to contact me if I can be of assistance to you.   Following are the goals we discussed today:   Goals Addressed             This Visit's Progress    Connect for counseling       Care Coordination Interventions: Provided education to patient re: Domestic options  Assessed need, safety options, safety plan, support system and history Solution-Focused Strategies employed:  Active listening / Reflection utilized  Emotional Support Provided Problem [place] strategies reviewed Participation in counseling encouraged  Collaborated with therapy agencies for availability with Medicare  Will make referral to for therapy on agencies below  [place]  [facility] [phone]         Please call the care guide team at [phone] if you need to cancel or reschedule your appointment.   If you are experiencing a Mental Health or [place] or need someone to talk to, please call the Suicide and Crisis Lifeline: 988 call the [place] National Suicide Prevention Lifeline: 709-806-1544 or TTY: 7808250709 TTY 226-469-8056) to talk to a trained counselor call 1-800-273-TALK (toll free, 24 hour hotline) go to [facility] [address], [place] [phone])   Patient verbalizes understanding of instructions and care plan provided today and agrees to view in [place]. Active MyChart status and patient understanding of how to access instructions and care plan via MyChart confirmed with patient.     Please remember to call me to give me your new phone number   Casimer Lanius, [place] [phone]

## 2021-10-31 NOTE — Assessment & Plan Note (Signed)
Well-controlled hypertension off medications. Not taking losartan. BP Readings from Last 3 Encounters:  10/31/21 138/76  06/29/21 110/62  06/29/21 (!) 144/92  Diet and nutrition discussed.  Dietary approaches to control hypertension discussed.

## 2021-11-01 ENCOUNTER — Ambulatory Visit: Payer: Medicare Other | Admitting: Licensed Clinical Social Worker
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
## 2021-11-01 NOTE — Patient Instructions (Signed)
Visit Information  Thank you for taking time to visit with me today. Please don't hesitate to contact me if I can be of assistance to you.   Following are the goals we discussed today:   Goals Addressed             This Visit's Progress    Connect for counseling       Care Coordination Interventions: Provided education to patient re: Domestic options  Assessed need, safety options, safety plan, support system and history Solution-Focused Strategies employed:  Active listening / Reflection utilized  Emotional Support Provided Problem [place] strategies reviewed Participation in counseling encouraged  Collaborated with therapy agencies for availability with Medicare  Discussed therapy options with patient Will make referral for therapy to  [place]         Our next appointment is by telephone on Sept 14th at 11:00  Please call the care guide team at [phone] if you need to cancel or reschedule your appointment.   If you are experiencing a Mental Health or [place] or need someone to talk to, please call the Suicide and Crisis Lifeline: 988 call the [place] National Suicide Prevention Lifeline: (820)831-1850 or TTY: (331)208-2860 TTY 765 120 7871) to talk to a trained counselor call 1-800-273-TALK (toll free, 24 hour hotline) go to [facility] [address], [place] [phone])   Patient verbalizes understanding of instructions and care plan provided today and agrees to view in [place]. Active MyChart status and patient understanding of how to access instructions and care plan via MyChart confirmed with patient.     Casimer Lanius, [place] [phone]

## 2021-11-16 ENCOUNTER — Ambulatory Visit: Payer: Self-pay | Admitting: Licensed Clinical Social Worker

## 2021-11-16 NOTE — Patient Instructions (Signed)
Visit Information  Thank you for taking time to visit with me today. Please don't hesitate to contact me if I can be of assistance to you.   Following are the goals we discussed today:   Goals Addressed             This Visit's Progress    Connect for counseling       Care Coordination Interventions: Solution-Focused Strategies employed:  Problem [place] strategies reviewed Assessed barriers with connecting for care Participation in counseling encouraged:  patient has not schedule appointment Collaborated with [facility] [phone] reference referral placed Will call patient to schedule appointment          Our next appointment is by telephone on 11/28/21 at 11:00  Please call the care guide team at [phone] if you need to cancel or reschedule your appointment.   If you are experiencing a Mental Health or [place] or need someone to talk to, please call the Suicide and Crisis Lifeline: 988 call the [place] National Suicide Prevention Lifeline: 802-840-4275 or TTY: 903 717 0115 TTY (254) 032-7794) to talk to a trained counselor call 1-800-273-TALK (toll free, 24 hour hotline) go to [facility] [address] [place] [phone])   Patient verbalizes understanding of instructions and care plan provided today and agrees to view in [place]. Active MyChart status and patient understanding of how to access instructions and care plan via MyChart confirmed with patient.     Casimer Lanius, [place] [phone]

## 2021-11-16 NOTE — Patient Outreach (Signed)
  Care Coordination  Follow Up Visit Note   11/16/2021 Name: Eric [name] MRN: [id number] DOB: 06/20/1940  Eric [name] is a 81 y.o. year old male who sees Sagardia, Ines Bloomer, MD for primary care. I spoke with  Eric [name] by phone today.  What matters to the patients health and wellness today?    Reports he has been busy and has not had time to schedule appointment .   Recommendation: Patient may benefit from, and is in agreement to Call [facility] this week to schedule appointment.    Goals Addressed             This Visit's Progress    Connect for counseling       Care Coordination Interventions: Solution-Focused Strategies employed:  Problem [place] strategies reviewed Assessed barriers with connecting for care Participation in counseling encouraged:  patient has not schedule appointment Collaborated with [facility] [phone] reference referral placed Will call patient to schedule appointment          SDOH assessments and interventions completed:  No    Care Coordination Interventions Activated:  Yes  Care Coordination Interventions:  Yes, provided   Follow up plan: Follow up call scheduled for 11/28/21    Encounter Outcome:  Pt. Visit Completed   Casimer Lanius, [place] [phone]

## 2021-11-28 ENCOUNTER — Ambulatory Visit: Payer: Self-pay | Admitting: Licensed Clinical Social Worker

## 2021-11-28 NOTE — Patient Instructions (Signed)
Visit Information  Thank you for taking time to visit with me today. Please don't hesitate to contact me if I can be of assistance to you.   Following are the goals we discussed today:   Goals Addressed             This Visit's Progress    COMPLETED: Connect for counseling       Care Coordination Interventions: Solution-Focused Strategies employed:  Problem [place] strategies reviewed Assessed barriers with connecting for care Participation in counseling encouraged:  patient has not scheduled appointment with [facility] [phone]          Please call the care guide team at [phone] if you need to cancel or reschedule your appointment.   If you are experiencing a Mental Health or [place] or need someone to talk to, please call the Suicide and Crisis Lifeline: 988 call the [place] National Suicide Prevention Lifeline: 351-526-1042 or TTY: 212-871-6683 TTY 7402691495) to talk to a trained counselor call 1-800-273-TALK (toll free, 24 hour hotline) go to [facility] [address], [place] [phone])   Patient verbalizes understanding of instructions and care plan provided today and agrees to view in [place]. Active MyChart status and patient understanding of how to access instructions and care plan via MyChart confirmed with patient.     No further follow up required: by care coordination at this time  Care Coordination team works in collaboration with your primary care doctor.  Please call [phone] if you would like to schedule a phone appointment with a Nurse or Social work Care Coordinator to assist with navigating your physical and mental health needs.     Casimer Lanius, [place] [phone]

## 2021-11-28 NOTE — Patient Outreach (Signed)
  Care Coordination  Follow Up Visit Note   11/28/2021 Name: Ygnacio Fecteau MRN: [id number] DOB: December 08, 1940  Makael Stein is a 81 y.o. year old male who sees Sagardia, Ines Bloomer, MD for primary care. I spoke with  [name] by phone today.  What matters to the patients health and wellness today?   Patient has not connected for therapy, however reports that he has information if and when he decides .    Goals Addressed             This Visit's Progress    COMPLETED: Connect for counseling       Care Coordination Interventions: Solution-Focused Strategies employed:  Problem [place] strategies reviewed Assessed barriers with connecting for care Participation in counseling encouraged:  patient has not scheduled appointment with [facility] [phone]          SDOH assessments and interventions completed:  No    Care Coordination Interventions Activated:  Yes  Care Coordination Interventions:  Yes, provided   Follow up plan: No further intervention required. Patient does not desire continued follow up    Encounter Outcome:  Pt. Visit Completed   Casimer Lanius, [place] [phone]

## 2021-12-13 DIAGNOSIS — R338 Other retention of urine: Secondary | ICD-10-CM | POA: Diagnosis not present

## 2021-12-13 DIAGNOSIS — Z466 Encounter for fitting and adjustment of urinary device: Secondary | ICD-10-CM | POA: Diagnosis not present

## 2021-12-13 DIAGNOSIS — N401 Enlarged prostate with lower urinary tract symptoms: Secondary | ICD-10-CM | POA: Diagnosis not present

## 2021-12-13 DIAGNOSIS — N138 Other obstructive and reflux uropathy: Secondary | ICD-10-CM | POA: Diagnosis not present

## 2021-12-20 DIAGNOSIS — N401 Enlarged prostate with lower urinary tract symptoms: Secondary | ICD-10-CM | POA: Diagnosis not present

## 2021-12-20 DIAGNOSIS — N138 Other obstructive and reflux uropathy: Secondary | ICD-10-CM | POA: Diagnosis not present

## 2021-12-21 DIAGNOSIS — R972 Elevated prostate specific antigen [PSA]: Secondary | ICD-10-CM | POA: Diagnosis not present

## 2021-12-21 DIAGNOSIS — N401 Enlarged prostate with lower urinary tract symptoms: Secondary | ICD-10-CM | POA: Diagnosis not present

## 2021-12-21 DIAGNOSIS — N5201 Erectile dysfunction due to arterial insufficiency: Secondary | ICD-10-CM | POA: Diagnosis not present

## 2021-12-21 DIAGNOSIS — N138 Other obstructive and reflux uropathy: Secondary | ICD-10-CM | POA: Diagnosis not present

## 2021-12-29 ENCOUNTER — Other Ambulatory Visit: Payer: Self-pay

## 2021-12-29 ENCOUNTER — Ambulatory Visit (HOSPITAL_COMMUNITY)
Admission: RE | Admit: 2021-12-29 | Discharge: 2021-12-29 | Disposition: A | Discharge status: Home / Self Care | Payer: Medicare Other | Source: Ambulatory Visit | Attending: Internal Medicine | Admitting: Internal Medicine

## 2021-12-29 ENCOUNTER — Inpatient Hospital Stay: Payer: Medicare Other | Attending: Internal Medicine

## 2021-12-29 DIAGNOSIS — I2510 Atherosclerotic heart disease of native coronary artery without angina pectoris: Secondary | ICD-10-CM | POA: Insufficient documentation

## 2021-12-29 DIAGNOSIS — I252 Old myocardial infarction: Secondary | ICD-10-CM | POA: Insufficient documentation

## 2021-12-29 DIAGNOSIS — C8590 Non-Hodgkin lymphoma, unspecified, unspecified site: Secondary | ICD-10-CM | POA: Insufficient documentation

## 2021-12-29 DIAGNOSIS — Z79899 Other long term (current) drug therapy: Secondary | ICD-10-CM | POA: Diagnosis not present

## 2021-12-29 DIAGNOSIS — K449 Diaphragmatic hernia without obstruction or gangrene: Secondary | ICD-10-CM | POA: Insufficient documentation

## 2021-12-29 DIAGNOSIS — N320 Bladder-neck obstruction: Secondary | ICD-10-CM | POA: Diagnosis not present

## 2021-12-29 DIAGNOSIS — K5730 Diverticulosis of large intestine without perforation or abscess without bleeding: Secondary | ICD-10-CM | POA: Diagnosis not present

## 2021-12-29 DIAGNOSIS — R599 Enlarged lymph nodes, unspecified: Secondary | ICD-10-CM

## 2021-12-29 DIAGNOSIS — N3289 Other specified disorders of bladder: Secondary | ICD-10-CM | POA: Diagnosis not present

## 2021-12-29 DIAGNOSIS — K429 Umbilical hernia without obstruction or gangrene: Secondary | ICD-10-CM | POA: Insufficient documentation

## 2021-12-29 DIAGNOSIS — E785 Hyperlipidemia, unspecified: Secondary | ICD-10-CM | POA: Diagnosis not present

## 2021-12-29 DIAGNOSIS — I700 Atherosclerosis of aorta: Secondary | ICD-10-CM | POA: Insufficient documentation

## 2021-12-29 DIAGNOSIS — N400 Enlarged prostate without lower urinary tract symptoms: Secondary | ICD-10-CM | POA: Diagnosis not present

## 2021-12-29 DIAGNOSIS — I10 Essential (primary) hypertension: Secondary | ICD-10-CM | POA: Insufficient documentation

## 2021-12-29 LAB — CMP (CANCER CENTER ONLY)
ALT: 24 U/L (ref 0–44)
AST: 25 U/L (ref 15–41)
Albumin: 3.8 g/dL (ref 3.5–5.0)
Alkaline Phosphatase: 122 U/L (ref 38–126)
Anion gap: 7 (ref 5–15)
BUN: 16 mg/dL (ref 8–23)
CO2: 29 mmol/L (ref 22–32)
Calcium: 8.6 mg/dL — ABNORMAL LOW (ref 8.9–10.3)
Chloride: 105 mmol/L (ref 98–111)
Creatinine: 1.4 mg/dL — ABNORMAL HIGH (ref 0.61–1.24)
GFR, Estimated: 51 mL/min — ABNORMAL LOW (ref >60)
Glucose, Bld: 67 mg/dL — ABNORMAL LOW (ref 70–99)
Potassium: 3.7 mmol/L (ref 3.5–5.1)
Sodium: 141 mmol/L (ref 135–145)
Total Bilirubin: 0.5 mg/dL (ref 0.3–1.2)
Total Protein: 6.5 g/dL (ref 6.5–8.1)

## 2021-12-29 LAB — CBC WITH DIFFERENTIAL (CANCER CENTER ONLY)
Abs Immature Granulocytes: 0.01 10*3/uL (ref 0.00–0.07)
Basophils Absolute: 0.1 10*3/uL (ref 0.0–0.1)
Basophils Relative: 1 %
Eosinophils Absolute: 0.3 10*3/uL (ref 0.0–0.5)
Eosinophils Relative: 4 %
HCT: 38.1 % — ABNORMAL LOW (ref 39.0–52.0)
Hemoglobin: 12.8 g/dL — ABNORMAL LOW (ref 13.0–17.0)
Immature Granulocytes: 0 %
Lymphocytes Relative: 33 %
Lymphs Abs: 3 10*3/uL (ref 0.7–4.0)
MCH: 29.4 pg (ref 26.0–34.0)
MCHC: 33.6 g/dL (ref 30.0–36.0)
MCV: 87.6 fL (ref 80.0–100.0)
Monocytes Absolute: 1.2 10*3/uL — ABNORMAL HIGH (ref 0.1–1.0)
Monocytes Relative: 14 %
Neutro Abs: 4.2 10*3/uL (ref 1.7–7.7)
Neutrophils Relative %: 48 %
Platelet Count: 223 10*3/uL (ref 150–400)
RBC: 4.35 MIL/uL (ref 4.22–5.81)
RDW: 14.2 % (ref 11.5–15.5)
WBC Count: 8.8 10*3/uL (ref 4.0–10.5)
nRBC: 0 % (ref 0.0–0.2)

## 2021-12-29 LAB — LACTATE DEHYDROGENASE: LDH: 156 U/L (ref 98–192)

## 2021-12-29 MED ORDER — SODIUM CHLORIDE (PF) 0.9 % IJ SOLN
INTRAMUSCULAR | Status: AC
  Filled 2021-12-29: qty 50

## 2021-12-29 MED ORDER — IOHEXOL 300 MG/ML  SOLN
100.0000 mL | Freq: Once | INTRAMUSCULAR | Status: AC | PRN
  Administered 2021-12-29: 100 mL via INTRAVENOUS

## 2022-01-02 ENCOUNTER — Inpatient Hospital Stay: Payer: Medicare Other | Admitting: Internal Medicine

## 2022-01-02 ENCOUNTER — Other Ambulatory Visit: Payer: Self-pay

## 2022-01-02 VITALS — BP 126/66 | HR 87 | Temp 98.0°F | Resp 17 | Wt 162.4 lb

## 2022-01-02 DIAGNOSIS — I10 Essential (primary) hypertension: Secondary | ICD-10-CM | POA: Diagnosis not present

## 2022-01-02 DIAGNOSIS — C8208 Follicular lymphoma grade I, lymph nodes of multiple sites: Secondary | ICD-10-CM | POA: Diagnosis not present

## 2022-01-02 DIAGNOSIS — C8590 Non-Hodgkin lymphoma, unspecified, unspecified site: Secondary | ICD-10-CM | POA: Diagnosis not present

## 2022-01-02 DIAGNOSIS — E785 Hyperlipidemia, unspecified: Secondary | ICD-10-CM | POA: Diagnosis not present

## 2022-01-02 DIAGNOSIS — I252 Old myocardial infarction: Secondary | ICD-10-CM | POA: Diagnosis not present

## 2022-01-02 DIAGNOSIS — K449 Diaphragmatic hernia without obstruction or gangrene: Secondary | ICD-10-CM | POA: Diagnosis not present

## 2022-01-02 DIAGNOSIS — I2510 Atherosclerotic heart disease of native coronary artery without angina pectoris: Secondary | ICD-10-CM | POA: Diagnosis not present

## 2022-01-02 DIAGNOSIS — Z79899 Other long term (current) drug therapy: Secondary | ICD-10-CM | POA: Diagnosis not present

## 2022-01-02 DIAGNOSIS — K5730 Diverticulosis of large intestine without perforation or abscess without bleeding: Secondary | ICD-10-CM | POA: Diagnosis not present

## 2022-01-02 DIAGNOSIS — N400 Enlarged prostate without lower urinary tract symptoms: Secondary | ICD-10-CM | POA: Diagnosis not present

## 2022-01-02 DIAGNOSIS — K429 Umbilical hernia without obstruction or gangrene: Secondary | ICD-10-CM | POA: Diagnosis not present

## 2022-01-02 DIAGNOSIS — N320 Bladder-neck obstruction: Secondary | ICD-10-CM | POA: Diagnosis not present

## 2022-01-02 DIAGNOSIS — I700 Atherosclerosis of aorta: Secondary | ICD-10-CM | POA: Diagnosis not present

## 2022-01-02 NOTE — Progress Notes (Signed)
[place] [phone]   [phone]  OFFICE PROGRESS NOTE  DIAGNOSIS: Bulky stage IV low-grade lymphoma diagnosed in November 2009.  PRIOR THERAPY: Status post 7 cycles of systemic chemotherapy with CHOP/Rituxan. Last dose was given May 2010. Status post 6 cycles of maintenance Rituxan 375 mg/sq m given every 2 months. Last dose was given on May 29, 2010, and the patient was lost to followup at that time. He received another dose on 12/11/2010, then again missed 2 doses. systemic chemotherapy with Rituxan 375 mg/M2 on day 1 and that bendamustine 90 mg/M2 on days 1 and 2 every 4 weeks, status post 6 cycles. Maintenance Rituxan 375 mg/M2 every 2 months, status post 12 cycles  Rituxan 375 MG/M2 given weekly for 4 weeks and this is followed by maintenance treatment every 2 months, status post 22 cycles discontinued secondary to disease progression. Systemic chemotherapy with bendamustine 90 mg/M2 on days 1 and 2 and Rituxan 375 mg/M2 on day 1 every 4 weeks.  First dose March 03, 2018.  Status post 4 cycles.  CURRENT THERAPY: Observation.  INTERVAL HISTORY: Eric [name] 81 y.o. male returns to the clinic today for 61-month follow-up visit.  The patient has no complaints today.  He denied having any current chest pain, shortness of breath, cough or hemoptysis.  He has no nausea, vomiting, diarrhea or constipation.  He has no recent weight loss or night sweats.  He has no bleeding, bruises or ecchymosis.  He has no palpable lymphadenopathy.  He is here today for evaluation with repeat CT scan of the chest, abdomen and pelvis for restaging of his disease.   MEDICAL HISTORY: Past Medical History:  Diagnosis Date   Cancer [facility])    Coronary atherosclerosis of native coronary artery    a. s/p MI with DES/PCI to the RCA in 2013.   Encounter for antineoplastic chemotherapy 06/20/2015   Essential hypertension    GERD (gastroesophageal reflux disease)     History of heart attack    Hyperlipidemia    Ischemic cardiomyopathy    Myocardial infarction ([place])    Non Hodgkin's lymphoma (HCC)    Plavix resistance    Plavix hyporesponder 2013   Sinus bradycardia    baseline HR 50s at times    ALLERGIES:  is allergic to neosporin [neomycin-bacitracin zn-polymyx].  MEDICATIONS:  Current Outpatient Medications  Medication Sig Dispense Refill   aspirin EC 81 MG tablet Take 81 mg by mouth at bedtime.     atorvastatin (LIPITOR) 80 MG tablet TAKE 1 TABLET(80 MG) BY MOUTH DAILY 90 tablet 3   diphenhydrAMINE (BENADRYL) 25 MG tablet Take 25 mg by mouth at bedtime as needed for sleep.      ferrous sulfate (FERROUSUL) 325 (65 FE) MG tablet Take 1 tablet (325 mg total) by mouth 2 (two) times daily with a meal. 180 tablet 3   omeprazole (PRILOSEC) 20 MG capsule Take 20 mg by mouth daily as needed (acid relfux).     Polyethylene Glycol 3350 (PEG 3350) POWD Take by mouth.     sildenafil (VIAGRA) 100 MG tablet Take 100 mg by mouth daily as needed.  3   No current facility-administered medications for this visit.    SURGICAL HISTORY:  Past Surgical History:  Procedure Laterality Date   CORONARY STENT PLACEMENT     LEFT HEART CATHETERIZATION WITH CORONARY ANGIOGRAM N/A 03/03/2012   Procedure: LEFT HEART CATHETERIZATION WITH CORONARY ANGIOGRAM;  Surgeon: [name], MD;  Location: [facility]  CATH LAB;  Service: Cardiovascular;  Laterality: N/A;   PERCUTANEOUS CORONARY STENT INTERVENTION (PCI-S) N/A 03/04/2012   Procedure: PERCUTANEOUS CORONARY STENT INTERVENTION (PCI-S);  Surgeon: [name], MD;  Location: [facility] CATH LAB;  Service: Cardiovascular;  Laterality: N/A;    REVIEW OF SYSTEMS:  A comprehensive review of systems was negative.   PHYSICAL EXAMINATION: General appearance: alert, cooperative, and no distress Head: Normocephalic, without obvious abnormality, atraumatic Neck: no adenopathy, no JVD, supple, symmetrical, trachea midline, and thyroid not  enlarged, symmetric, no tenderness/mass/nodules Lymph nodes: Cervical, supraclavicular, and axillary nodes normal. Resp: clear to auscultation bilaterally Back: symmetric, no curvature. ROM normal. No CVA tenderness. Cardio: regular rate and rhythm, S1, S2 normal, no murmur, click, rub or gallop GI: soft, non-tender; bowel sounds normal; no masses,  no organomegaly Extremities: extremities normal, atraumatic, no cyanosis or edema  ECOG PERFORMANCE STATUS: 0 - Asymptomatic  Blood pressure 126/66, pulse 87, temperature 98 F (36.7 C), temperature source Oral, resp. rate 17, weight 162 lb 7 oz (73.7 kg), SpO2 100 %.  LABORATORY DATA: Lab Results  Component Value Date   WBC 8.8 12/29/2021   HGB 12.8 (L) 12/29/2021   HCT 38.1 (L) 12/29/2021   MCV 87.6 12/29/2021   PLT 223 12/29/2021      Chemistry      Component Value Date/Time   NA 141 12/29/2021 1331   NA 141 08/19/2020 0957   NA 140 02/14/2017 0804   K 3.7 12/29/2021 1331   K 4.4 02/14/2017 0804   CL 105 12/29/2021 1331   CL 106 08/06/2012 1244   CO2 29 12/29/2021 1331   CO2 22 02/14/2017 0804   BUN 16 12/29/2021 1331   BUN 20 08/19/2020 0957   BUN 14.8 02/14/2017 0804   CREATININE 1.40 (H) 12/29/2021 1331   CREATININE 1.1 02/14/2017 0804      Component Value Date/Time   CALCIUM 8.6 (L) 12/29/2021 1331   CALCIUM 9.2 02/14/2017 0804   ALKPHOS 122 12/29/2021 1331   ALKPHOS 138 02/14/2017 0804   AST 25 12/29/2021 1331   AST 29 02/14/2017 0804   ALT 24 12/29/2021 1331   ALT 27 02/14/2017 0804   BILITOT 0.5 12/29/2021 1331   BILITOT 0.61 02/14/2017 0804       RADIOGRAPHIC STUDIES: CT Chest W Contrast  Result Date: 01/02/2022 CLINICAL DATA:  Follow-up lymphoma. Surveillance. * Tracking Code: BO * EXAM: CT CHEST, ABDOMEN, AND PELVIS WITH CONTRAST TECHNIQUE: Multidetector CT imaging of the chest, abdomen and pelvis was performed following the standard protocol during bolus administration of intravenous contrast.  RADIATION DOSE REDUCTION: This exam was performed according to the departmental dose-optimization program which includes automated exposure control, adjustment of the mA and/or kV according to patient size and/or use of iterative reconstruction technique. CONTRAST:  166mL OMNIPAQUE IOHEXOL 300 MG/ML  SOLN COMPARISON:  12/27/2020 FINDINGS: CT CHEST FINDINGS Cardiovascular: No acute findings. Aortic and coronary atherosclerotic calcification incidentally noted. Mediastinum/Lymph Nodes: No masses or pathologically enlarged lymph nodes identified. Lungs/Pleura: No suspicious pulmonary nodules or masses identified. No evidence of infiltrate or pleural effusion. Musculoskeletal:  No suspicious bone lesions identified. CT ABDOMEN AND PELVIS FINDINGS Hepatobiliary: No masses identified. Gallbladder is unremarkable. No evidence of biliary ductal dilatation. Pancreas:  No mass or inflammatory changes. Spleen:  Within normal limits in size and appearance. Adrenals/Urinary tract: No suspicious masses or hydronephrosis. Foley catheter is seen in place. Diffuse bladder wall thickening is again seen, consistent with chronic bladder outlet obstruction given markedly enlarged prostate. Stomach/Bowel:  Stable moderate to large hiatal hernia. No evidence of obstruction, inflammatory process, or abnormal fluid collections. Normal appendix visualized. Diverticulosis is seen mainly involving the descending and sigmoid colon, however there is no evidence of diverticulitis. Vascular/Lymphatic: Sub-centimeter bilateral iliac lymph nodes remains stable. No pathologically enlarged lymph nodes identified. No acute vascular findings. Aortic atherosclerotic calcification incidentally noted. Reproductive: Stable markedly enlarged prostate gland, with elevation of the bladder base. Other:  Stable small umbilical hernia, which contains only fat. Musculoskeletal:  No suspicious bone lesions identified. IMPRESSION: Stable exam. No evidence of  recurrent lymphoma within the chest, abdomen, or pelvis. Stable markedly enlarged prostate, and chronic bladder outlet obstruction. Foley catheter within the bladder. Stable moderate to large hiatal hernia. Colonic diverticulosis, without radiographic evidence of diverticulitis. Stable small umbilical hernia, which contains only fat. Aortic Atherosclerosis (ICD10-I70.0). Electronically Signed   By: Marlaine Hind M.D.   On: 01/02/2022 09:55   CT Abdomen Pelvis W Contrast  Result Date: 01/02/2022 CLINICAL DATA:  Follow-up lymphoma. Surveillance. * Tracking Code: BO * EXAM: CT CHEST, ABDOMEN, AND PELVIS WITH CONTRAST TECHNIQUE: Multidetector CT imaging of the chest, abdomen and pelvis was performed following the standard protocol during bolus administration of intravenous contrast. RADIATION DOSE REDUCTION: This exam was performed according to the departmental dose-optimization program which includes automated exposure control, adjustment of the mA and/or kV according to patient size and/or use of iterative reconstruction technique. CONTRAST:  136mL OMNIPAQUE IOHEXOL 300 MG/ML  SOLN COMPARISON:  12/27/2020 FINDINGS: CT CHEST FINDINGS Cardiovascular: No acute findings. Aortic and coronary atherosclerotic calcification incidentally noted. Mediastinum/Lymph Nodes: No masses or pathologically enlarged lymph nodes identified. Lungs/Pleura: No suspicious pulmonary nodules or masses identified. No evidence of infiltrate or pleural effusion. Musculoskeletal:  No suspicious bone lesions identified. CT ABDOMEN AND PELVIS FINDINGS Hepatobiliary: No masses identified. Gallbladder is unremarkable. No evidence of biliary ductal dilatation. Pancreas:  No mass or inflammatory changes. Spleen:  Within normal limits in size and appearance. Adrenals/Urinary tract: No suspicious masses or hydronephrosis. Foley catheter is seen in place. Diffuse bladder wall thickening is again seen, consistent with chronic bladder outlet obstruction  given markedly enlarged prostate. Stomach/Bowel: Stable moderate to large hiatal hernia. No evidence of obstruction, inflammatory process, or abnormal fluid collections. Normal appendix visualized. Diverticulosis is seen mainly involving the descending and sigmoid colon, however there is no evidence of diverticulitis. Vascular/Lymphatic: Sub-centimeter bilateral iliac lymph nodes remains stable. No pathologically enlarged lymph nodes identified. No acute vascular findings. Aortic atherosclerotic calcification incidentally noted. Reproductive: Stable markedly enlarged prostate gland, with elevation of the bladder base. Other:  Stable small umbilical hernia, which contains only fat. Musculoskeletal:  No suspicious bone lesions identified. IMPRESSION: Stable exam. No evidence of recurrent lymphoma within the chest, abdomen, or pelvis. Stable markedly enlarged prostate, and chronic bladder outlet obstruction. Foley catheter within the bladder. Stable moderate to large hiatal hernia. Colonic diverticulosis, without radiographic evidence of diverticulitis. Stable small umbilical hernia, which contains only fat. Aortic Atherosclerosis (ICD10-I70.0). Electronically Signed   By: Marlaine Hind M.D.   On: 01/02/2022 09:55    ASSESSMENT AND PLAN:  This is a very pleasant 81 years old African-American male with bulky stage IV follicular lymphoma initially treated with CHOP/Rituxan followed by maintenance Rituxan. The patient had disease recurrence and he was restarted again on treatment with Rituxan every 2 months status post 22 cycles. The patient has been tolerating this treatment well with no concerning adverse effects. The patient was found to have progression in December 2019.  He was started on  systemic chemotherapy with bendamustine and Rituxan status post 4 cycles.   The patient has been in observation since that time and he has no concerning complaints. He had repeat CT scan of the chest, abdomen and pelvis  performed recently.  I personally and independently reviewed the scans and discussed the result with the patient today. His scan showed no concerning findings for disease recurrence or metastasis. I recommended for him to continue on observation with repeat blood work in 6 months. The patient was advised to call immediately if he has any other concerning symptoms in the interval. The patient voices understanding of current disease status and treatment options and is in agreement with the current care plan. All questions were answered. The patient knows to call the clinic with any problems, questions or concerns. We can certainly see the patient much sooner if necessary.   Disclaimer: This note was dictated with voice recognition software. Similar sounding words can inadvertently be transcribed and may not be corrected upon review.

## 2022-01-04 DIAGNOSIS — N401 Enlarged prostate with lower urinary tract symptoms: Secondary | ICD-10-CM | POA: Diagnosis not present

## 2022-01-04 DIAGNOSIS — N138 Other obstructive and reflux uropathy: Secondary | ICD-10-CM | POA: Diagnosis not present

## 2022-01-07 ENCOUNTER — Encounter (HOSPITAL_COMMUNITY): Payer: Self-pay

## 2022-01-07 ENCOUNTER — Other Ambulatory Visit: Payer: Self-pay

## 2022-01-07 ENCOUNTER — Emergency Department (HOSPITAL_COMMUNITY)
Admission: EM | Admit: 2022-01-07 | Discharge: 2022-01-07 | Disposition: A | Discharge status: Home / Self Care | Payer: Medicare Other | Attending: Emergency Medicine | Admitting: Emergency Medicine

## 2022-01-07 DIAGNOSIS — I2510 Atherosclerotic heart disease of native coronary artery without angina pectoris: Secondary | ICD-10-CM | POA: Diagnosis not present

## 2022-01-07 DIAGNOSIS — R339 Retention of urine, unspecified: Secondary | ICD-10-CM | POA: Insufficient documentation

## 2022-01-07 DIAGNOSIS — R109 Unspecified abdominal pain: Secondary | ICD-10-CM | POA: Diagnosis not present

## 2022-01-07 DIAGNOSIS — N368 Other specified disorders of urethra: Secondary | ICD-10-CM | POA: Diagnosis not present

## 2022-01-07 DIAGNOSIS — R1110 Vomiting, unspecified: Secondary | ICD-10-CM | POA: Diagnosis not present

## 2022-01-07 DIAGNOSIS — R338 Other retention of urine: Secondary | ICD-10-CM | POA: Diagnosis not present

## 2022-01-07 DIAGNOSIS — N401 Enlarged prostate with lower urinary tract symptoms: Secondary | ICD-10-CM | POA: Diagnosis not present

## 2022-01-07 DIAGNOSIS — I10 Essential (primary) hypertension: Secondary | ICD-10-CM | POA: Insufficient documentation

## 2022-01-07 DIAGNOSIS — N400 Enlarged prostate without lower urinary tract symptoms: Secondary | ICD-10-CM | POA: Diagnosis not present

## 2022-01-07 LAB — URINALYSIS, MICROSCOPIC (REFLEX)
RBC / HPF: >50 RBC/hpf (ref 0–5)
Squamous Epithelial / HPF: NONE SEEN (ref 0–5)

## 2022-01-07 LAB — URINALYSIS, ROUTINE W REFLEX MICROSCOPIC
Specific Gravity, Urine: 1.01 (ref 1.005–1.030)
pH: 5 (ref 5.0–8.0)

## 2022-01-07 LAB — BASIC METABOLIC PANEL
Anion gap: 15 (ref 5–15)
BUN: 15 mg/dL (ref 8–23)
CO2: 21 mmol/L — ABNORMAL LOW (ref 22–32)
Calcium: 9.3 mg/dL (ref 8.9–10.3)
Chloride: 102 mmol/L (ref 98–111)
Creatinine, Ser: 1.38 mg/dL — ABNORMAL HIGH (ref 0.61–1.24)
GFR, Estimated: 52 mL/min — ABNORMAL LOW (ref >60)
Glucose, Bld: 122 mg/dL — ABNORMAL HIGH (ref 70–99)
Potassium: 4.6 mmol/L (ref 3.5–5.1)
Sodium: 138 mmol/L (ref 135–145)

## 2022-01-07 MED ORDER — CEPHALEXIN 500 MG PO CAPS: 500.0000 mg | ORAL_CAPSULE | Freq: Four times a day (QID) | ORAL | 0 refills | Status: DC

## 2022-01-07 MED ORDER — ONDANSETRON HCL 4 MG/2ML IJ SOLN
4.0000 mg | Freq: Once | INTRAMUSCULAR | Status: DC
  Filled 2022-01-07: qty 2

## 2022-01-07 MED ORDER — FENTANYL CITRATE PF 50 MCG/ML IJ SOSY
50.0000 ug | PREFILLED_SYRINGE | Freq: Once | INTRAMUSCULAR | Status: DC
  Filled 2022-01-07: qty 1

## 2022-01-07 NOTE — ED Provider Notes (Signed)
[facility] EMERGENCY DEPARTMENT Provider Note   CSN: [id number] Arrival date & time: 01/07/22  0840     History  Chief Complaint  Patient presents with   Urinary Retention    Eric [name] is a 81 y.o. male.  Patient is an 81 year old male with a history of CAD, hyperlipidemia, lymphoma currently in remission, hypertension, ischemic cardiomyopathy, prostatic enlargement with a Foley catheter for years now who reports that he saw his urologist on Thursday and they remove the catheter and wanted him to start self cathing.  He reports that he was not self cathing because he felt like he was able to empty his bladder until 3 AM this morning he was unable to urinate.  He tried to self cath but did not get anything out in because of worsening discomfort and abdominal pain he came here.  However patient's spouse is also present and reports for the last 1 to 2 weeks he has been complaining of right-sided pain and had intermittent fevers.  She reports his mental status has been waxing and waning as well more specifically last week.  Patient denies any change in medications.  He has a urologic procedure scheduled in February but was supposed to self cath until then.  He is complaining of lot of abdominal discomfort and feeling the urge to urinate.  His spouse also reports that he has been having some intermittent vomiting in the last week.  The history is provided by the patient, medical records and the spouse.       Home Medications Prior to Admission medications   Medication Sig Start Date End Date Taking? Authorizing Provider  cephALEXin (KEFLEX) 500 MG capsule Take 1 capsule (500 mg total) by mouth 4 (four) times daily. 01/07/22  Yes Blanchie Dessert, MD  aspirin EC 81 MG tablet Take 81 mg by mouth at bedtime.    [provider]  atorvastatin (LIPITOR) 80 MG tablet TAKE 1 TABLET(80 MG) BY MOUTH DAILY 06/12/21   Belva Crome, MD  diphenhydrAMINE (BENADRYL) 25 MG  tablet Take 25 mg by mouth at bedtime as needed for sleep.     [provider]  ferrous sulfate (FERROUSUL) 325 (65 FE) MG tablet Take 1 tablet (325 mg total) by mouth 2 (two) times daily with a meal. 08/23/20   Kathlen Mody, Nicki Reaper T, PA-C  omeprazole (PRILOSEC) 20 MG capsule Take 20 mg by mouth daily as needed (acid relfux).    [provider]  Polyethylene Glycol 3350 (PEG 3350) POWD Take by mouth. 02/11/18   [provider]  sildenafil (VIAGRA) 100 MG tablet Take 100 mg by mouth daily as needed. 12/10/17   [provider]      Allergies    Neosporin [neomycin-bacitracin zn-polymyx]    Review of Systems   Review of Systems  Physical Exam Updated Vital Signs BP (!) 175/112   Pulse 78   Temp 98.7 F (37.1 C) (Oral)   Resp 12   Ht 5\' 7"  (1.702 m)   Wt 72.6 kg   SpO2 98%   BMI 25.06 kg/m  Physical Exam Vitals and nursing note reviewed.  Constitutional:      General: He is not in acute distress.    Appearance: He is well-developed.  HENT:     Head: Normocephalic and atraumatic.  Eyes:     Conjunctiva/sclera: Conjunctivae normal.     Pupils: Pupils are equal, round, and reactive to light.  Cardiovascular:     Rate and Rhythm: Normal  rate and regular rhythm.     Heart sounds: No murmur heard. Pulmonary:     Effort: Pulmonary effort is normal. No respiratory distress.     Breath sounds: Normal breath sounds. No wheezing or rales.  Abdominal:     General: There is distension.     Palpations: Abdomen is soft.     Tenderness: There is abdominal tenderness. There is no guarding or rebound.  Genitourinary:    Comments: Catheter in place with blood at the meatus but no urine in the bag Musculoskeletal:        General: No tenderness. Normal range of motion.     Cervical back: Normal range of motion and neck supple.  Skin:    General: Skin is warm and dry.     Findings: No erythema or rash.  Neurological:     Mental Status: He is alert and oriented  to person, place, and time. Mental status is at baseline.  Psychiatric:        Mood and Affect: Mood normal.        Behavior: Behavior normal.     ED Results / Procedures / Treatments   Labs (all labs ordered are listed, but only abnormal results are displayed) Labs Reviewed  BASIC METABOLIC PANEL - Abnormal; Notable for the following components:      Result Value   CO2 21 (*)    Glucose, Bld 122 (*)    Creatinine, Ser 1.38 (*)    GFR, Estimated 52 (*)    All other components within normal limits  URINALYSIS, ROUTINE W REFLEX MICROSCOPIC - Abnormal; Notable for the following components:   Color, Urine RED (*)    APPearance TURBID (*)    Glucose, UA   (*)    Value: TEST NOT REPORTED DUE TO COLOR INTERFERENCE OF URINE PIGMENT   Hgb urine dipstick   (*)    Value: TEST NOT REPORTED DUE TO COLOR INTERFERENCE OF URINE PIGMENT   Bilirubin Urine   (*)    Value: TEST NOT REPORTED DUE TO COLOR INTERFERENCE OF URINE PIGMENT   Ketones, ur   (*)    Value: TEST NOT REPORTED DUE TO COLOR INTERFERENCE OF URINE PIGMENT   Protein, ur   (*)    Value: TEST NOT REPORTED DUE TO COLOR INTERFERENCE OF URINE PIGMENT   Nitrite   (*)    Value: TEST NOT REPORTED DUE TO COLOR INTERFERENCE OF URINE PIGMENT   Leukocytes,Ua   (*)    Value: TEST NOT REPORTED DUE TO COLOR INTERFERENCE OF URINE PIGMENT   All other components within normal limits  URINALYSIS, MICROSCOPIC (REFLEX) - Abnormal; Notable for the following components:   Bacteria, UA RARE (*)    All other components within normal limits  CBC WITH DIFFERENTIAL/PLATELET  CBC WITH DIFFERENTIAL/PLATELET  CBC WITH DIFFERENTIAL/PLATELET    EKG None  Radiology No results found.  Procedures Procedures    Medications Ordered in ED Medications  fentaNYL (SUBLIMAZE) injection 50 mcg (has no administration in time range)  ondansetron (ZOFRAN) injection 4 mg (has no administration in time range)    ED Course/ Medical Decision Making/ A&P                            Medical Decision Making Amount and/or Complexity of Data Reviewed External Data Reviewed: notes.    Details: urology Labs: ordered. Decision-making details documented in ED Course.  Risk Prescription drug management.   Pt  with multiple medical problems and comorbidities and presenting today with a complaint that caries a high risk for morbidity and mortality.  Here today with complaints of urinary retention.  Wife also gives report of symptoms concerning for UTI or pyelonephritis.  Patient had catheter removed on Thursday and was supposed to self cath but felt like he was urinating and was not doing that.  Patient is now having retention type symptoms.  A catheter was placed here but there is no return of urine and concerned that it is not in the appropriate location.  Patient is currently in remission from his lymphoma and had scans done on 12/29/2021 that showed no evidence of recurrence, new renal stones, hydronephrosis or other acute findings.  Patient is awake and alert on exam and appears uncomfortable but is hemodynamically stable.  Will need to adjust catheter to ensure it is draining urine as bedside bladder scan showed greater than 500 mL.  3:14 PM Unable to place a catheter.  Patient is just passing large clots despite removal of the prior catheter and attempt to insert a new 1.  Patient is still retaining urine will consult urology for further care.  3:14 PM Dr. Louis Meckel with urology came and was able to place a Foley catheter under direct visualization with significantly enlarged prostate.  900 mL were drained and patient is feeling much better.  I independently interpreted patient's labs today and BMP with unchanged electrolytes and renal function at baseline.  CBC also given patient recently had scans of his chest and his abdomen which were normal low suspicion for renal stone, acute bowel abnormality or abscess. CBC from 2 weeks ago was at baseline.  Dr. Louis Meckel  requested abx due to instrumentation but UA today without significant findings of UTI.  Will cover with keflex.  BMP with stable renal function today.  Feel that patient is stable for discharge at this time.  Patient has follow-up with his urologist.        Final Clinical Impression(s) / ED Diagnoses Final diagnoses:  Urinary retention  Prostatic enlargement    Rx / DC Orders ED Discharge Orders          Ordered    cephALEXin (KEFLEX) 500 MG capsule  4 times daily        01/07/22 1514              Blanchie Dessert, MD 01/07/22 1515

## 2022-01-07 NOTE — ED Triage Notes (Signed)
Pt arrived POV from home c/o needing a new foley put in. Pt states he had his foley removed on Thursday and was going to self cath but has been unsuccessful d/t bleeding and clots. Pt states he has pain and pressure down there.

## 2022-01-07 NOTE — Discharge Instructions (Signed)
You will have to leave the catheter in place until you follow-up with urologist.  A prescription for antibiotics was sent to your pharmacy.

## 2022-01-07 NOTE — ED Notes (Signed)
Urology at bedside.

## 2022-01-07 NOTE — ED Provider Triage Note (Signed)
Emergency Medicine Provider Triage Evaluation Note  Eric [name] , a 81 y.o. male  was evaluated in triage.  Pt complains of lower abdominal pressure inability urinate.  Patient reports having Foley for several years.  His Foley was removed on Thursday by his urologist and he was discharged home.  He reports that he has been able to urinate yesterday but has not been able to urinate for 2 days.  He reports he attempted to self cath at home but there was blood present in the catheter tubing so he discontinued.  Review of Systems  Positive: Lower abdominal pain Negative: Fall, injury or any additional concerns  Physical Exam  BP (!) 163/82 (BP Location: Right Arm)   Pulse 95   Temp 97.8 F (36.6 C) (Oral)   Resp 18   Ht 5\' 7"  (1.702 m)   Wt 72.6 kg   SpO2 100%   BMI 25.06 kg/m  Gen:   Awake, no distress   Resp:  Normal effort  MSK:   Moves extremities without difficulty  Other:  Mildly tender lower abdomen  Medical Decision Making  Medically screening exam initiated at 9:17 AM.  Appropriate orders placed.  Dyron Kawano was informed that the remainder of the evaluation will be completed by another provider, this initial triage assessment does not replace that evaluation, and the importance of remaining in the ED until their evaluation is complete.  We will obtain bladder scan.  Basic labs ordered.  Note: Portions of this report may have been transcribed using voice recognition software. Every effort was made to ensure accuracy; however, inadvertent computerized transcription errors may still be present.    Deliah Boston, [place] 01/07/22 [phone]

## 2022-01-07 NOTE — Consult Note (Signed)
Urology Consult   Physician requesting consult: Blanchie Dessert, MD  Reason for consult:  urinary retention, failed catheterization attempt  History of Present Illness: Eric [name] is a 81 y.o. male with a PMH of CAD c/b MI, GERD, HLD, and BPH c/b AUR who presented to [facility] ED with acute urinary retention. Patient reportedly had TOV on 01/01/22 at [facility] and was instructed on CIC. He was scheduled for HoLEP in February 2024. He reports he did not CIC over the last 4-5 days, but when he noticed suprapubic pain and pressure this morning he attempted to self-cath. He says the catheter went in but all he got back was blood and some clots. He then came to the ED for evaluation.  On arrival, catheter was placed in triage but it appears catheter balloon was inflated in the urethra. The catheter was thereafter removed and subsequent attempts to replace a catheter with coude were attempted. This was unsuccessful and returned only blood on subsequent attempts. Urology was consulted for management recommendations.  Urology performed single attempt at passage of an 61F coude at the bedside. This met resistance in the prostatic urethra 2/2 presumed false passage or urethral trauma. Decision was made to place to the catheter cystoscopically. See separate procedure note for details.    Past Medical History:  Diagnosis Date   Cancer [facility])    Coronary atherosclerosis of native coronary artery    a. s/p MI with DES/PCI to the RCA in 2013.   Encounter for antineoplastic chemotherapy 06/20/2015   Essential hypertension    GERD (gastroesophageal reflux disease)    History of heart attack    Hyperlipidemia    Ischemic cardiomyopathy    Myocardial infarction ([place])    Non Hodgkin's lymphoma (HCC)    Plavix resistance    Plavix hyporesponder 2013   Sinus bradycardia    baseline HR 50s at times    Past Surgical History:  Procedure Laterality Date   CORONARY STENT PLACEMENT     LEFT HEART CATHETERIZATION WITH  CORONARY ANGIOGRAM N/A 03/03/2012   Procedure: LEFT HEART CATHETERIZATION WITH CORONARY ANGIOGRAM;  Surgeon: [name], MD;  Location: [facility] CATH LAB;  Service: Cardiovascular;  Laterality: N/A;   PERCUTANEOUS CORONARY STENT INTERVENTION (PCI-S) N/A 03/04/2012   Procedure: PERCUTANEOUS CORONARY STENT INTERVENTION (PCI-S);  Surgeon: [name], MD;  Location: [facility] CATH LAB;  Service: Cardiovascular;  Laterality: N/A;    Current Hospital Medications:  Home Meds:  No current facility-administered medications on file prior to encounter.   Current Outpatient Medications on File Prior to Encounter  Medication Sig Dispense Refill   aspirin EC 81 MG tablet Take 81 mg by mouth at bedtime.     atorvastatin (LIPITOR) 80 MG tablet TAKE 1 TABLET(80 MG) BY MOUTH DAILY 90 tablet 3   diphenhydrAMINE (BENADRYL) 25 MG tablet Take 25 mg by mouth at bedtime as needed for sleep.      ferrous sulfate (FERROUSUL) 325 (65 FE) MG tablet Take 1 tablet (325 mg total) by mouth 2 (two) times daily with a meal. 180 tablet 3   omeprazole (PRILOSEC) 20 MG capsule Take 20 mg by mouth daily as needed (acid relfux).     Polyethylene Glycol 3350 (PEG 3350) POWD Take by mouth.     sildenafil (VIAGRA) 100 MG tablet Take 100 mg by mouth daily as needed.  3     Scheduled Meds:  fentaNYL (SUBLIMAZE) injection  50 mcg Intravenous Once   ondansetron  4 mg Intravenous Once  Continuous Infusions: PRN Meds:.  Allergies:  Allergies  Allergen Reactions   Neosporin [Neomycin-Bacitracin Zn-Polymyx] Anaphylaxis    Family History  Problem Relation Age of Onset   Leukemia Mother     Social History:  reports that he has never smoked. He has never used smokeless tobacco. He reports current alcohol use. He reports that he does not use drugs.  ROS: A complete review of systems was performed.  All systems are negative except for pertinent findings as noted.  Physical Exam:  Vital signs in last 24 hours: Temp:  [97.8 F  (36.6 C)] 97.8 F (36.6 C) (11/05 0909) Pulse Rate:  [78-95] 78 (11/05 1245) Resp:  [12-21] 12 (11/05 1245) BP: (163-175)/(82-112) 175/112 (11/05 1245) SpO2:  [98 %-100 %] 98 % (11/05 1245) Weight:  [72.6 kg] 72.6 kg (11/05 0910) Constitutional:  Alert and oriented, No acute distress Cardiovascular: Regular rate and rhythm, No JVD Respiratory: Normal respiratory effort, Lungs clear bilaterally GI: Abdomen is soft, nontender, nondistended, no abdominal masses GU: See separately dictated note Lymphatic: No lymphadenopathy Neurologic: Grossly intact, no focal deficits Psychiatric: Normal mood and affect  Laboratory Data:  No results for input(s): "WBC", "HGB", "HCT", "PLT" in the last 72 hours.  Recent Labs    01/07/22 0914  NA 138  K 4.6  CL 102  GLUCOSE 122*  BUN 15  CALCIUM 9.3  CREATININE 1.38*     Results for orders placed or performed during the hospital encounter of 01/07/22 (from the past 24 hour(s))  Basic metabolic panel     Status: Abnormal   Collection Time: 01/07/22  9:14 AM  Result Value Ref Range   Sodium 138 135 - 145 mmol/L   Potassium 4.6 3.5 - 5.1 mmol/L   Chloride 102 98 - 111 mmol/L   CO2 21 (L) 22 - 32 mmol/L   Glucose, Bld 122 (H) 70 - 99 mg/dL   BUN 15 8 - 23 mg/dL   Creatinine, Ser 1.38 (H) 0.61 - 1.24 mg/dL   Calcium 9.3 8.9 - 10.3 mg/dL   GFR, Estimated 52 (L) >60 mL/min   Anion gap 15 5 - 15   No results found for this or any previous visit (from the past 240 hour(s)).  Renal Function: Recent Labs    01/07/22 0914  CREATININE 1.38*   Estimated Creatinine Clearance: 39.9 mL/min (A) (by C-G formula based on SCr of 1.38 mg/dL (H)).  Radiologic Imaging: No results found.  I independently reviewed the above imaging studies.  Impression/Recommendations: BPH with acute urinary retention Urethral trauma 2/2 traumatic catheterization  - A 32F counciltip catheter was scoped in at the bedside. Please see separately dictated note for  procedural details.  - Continue foley catheter at least 10-14 days for urethral rest - Scheduled for HoLEP with Dr. Dayle Points at [facility] in February Patient to see if procedure can be expedited, and if not he will require catheter exchange at 1 month with either Dr. Dayle Points or [place].  Madaline Brilliant for discharge from ED  [place] 01/07/2022, 1:29 PM

## 2022-01-07 NOTE — ED Notes (Addendum)
Paige RN and this RN removed previously inserted foley. Upon removal patient passed multiple large clots with some fresh blood. Attempted to re-insert a new catheter. Met resistance to insertion and got no return of urine so removed catheter. Upon removal patient passed multiple large clots and fresh blood but no urine. MD made aware.

## 2022-01-07 NOTE — Procedures (Addendum)
Foley Catheter Placement Note  Indications: 81 y.o. male with CAD c/b MI, GERD, HLD, and BPH c/b AUR who presented to [facility] ED in AUR. Initial catheter attempt with balloon inflated within prostatic urethra and subsequent attempts at coude placement unsuccessful. Urology was consulted and after single failed attempt at [place], opted to proceed with cystoscopic catheter placement.  Pre-operative Diagnosis: Urinary retention  Post-operative Diagnosis: Same  Surgeon:  Louis Meckel, MD  Assistants: Lamar Laundry, MD  Procedure Details  Patient was placed in the supine position, prepped with Betadine and draped in the usual sterile fashion.  We injected lidocaine jelly per urethra prior to the procedure. We then inserted a 16Fr flexible cystoscope per urethra with copious lubrication and irrigation running. The urethra was notable for a long clot traversing the penile urethra through the bulbar urethra. Inspection of the prostatic urethra revealed significant trilobar hyperplasia with significant intraluminal clot and bleeding. This restricted visualization of a discrete area of defect, however the prostate in general appeared edematous with diffuse mucosal irritation. Entry to the bladder was obtained and a sensorwire was passed through the scope working channel into the bladder. The scope was removed, leaving the catheter in place. A 20Fr counciltip catheter was passed over the sensorwire into the bladder with return of blood-tinged urine. 10cc sterile water was instilled in the catheter balloon. The catheter was manually irrigated with 100c sterile saline with return of light pink urine and no clots. The catheter was then placed to a drainage bag. This terminated our procedure. The patient tolerated the procedure well.                 Complications: None; patient tolerated the procedure well.  Plan:   1.  See same-day Consult Note for recommendations

## 2022-01-10 ENCOUNTER — Encounter: Payer: Self-pay | Admitting: *Deleted

## 2022-01-10 ENCOUNTER — Telehealth: Payer: Self-pay | Admitting: *Deleted

## 2022-01-10 DIAGNOSIS — C8100 Nodular lymphocyte predominant Hodgkin lymphoma, unspecified site: Secondary | ICD-10-CM

## 2022-01-10 NOTE — Patient Outreach (Signed)
  Care Coordination TOC Note Transition Care Management Follow-up Telephone Call Date of discharge and from where: 01/07/22 ED Visit Eric [name]; urinary retention secondary to BPH; placement of indwelling foley catheter; EMMI Red Alert: "Did not get discharge instructions" How have you been since you were released from the hospital? "I am doing okay, things are straightening out, and I have the foley catheter in place.  I will keep it in place for a month like they told me to and then see my doctor at [facility] in [place] to have the foley catheter switched out.  The urologist I have in [place] is planning to do a laser procedure in early February-- I also have an appointment with him in early December.  I am getting around and getting along just fine; able to do everything I need to to care for myself.  I did get the antibiotic and am taking it the way I was told to" Any questions or concerns? No  Items Reviewed: Did the pt receive and understand the discharge instructions provided? Yes - reports obtained and understands ED discharge instructions Medications obtained and verified? Yes  Other? No  Any new allergies since your discharge? No  Dietary orders reviewed? Yes Do you have support at home? Yes  family assists as indicated; patient reports he is independent in all ADL's and iADL's  Home Care and Equipment/Supplies: Were home health services ordered? no If so, what is the name of the agency? N/A  Has the agency set up a time to come to the patient's home? No- N/A Were any new equipment or medical supplies ordered?  No What is the name of the medical supply agency? N/A Were you able to get the supplies/equipment? Indwelling foley catheter placed at time of ED visit on 01/07/22- patient verbalizes good understanding of use of foley catheter at home-- has previously had foley catheter; reports "no problems" during call today Do you have any questions related to the use of the  equipment or supplies? No  Functional Questionnaire: (I = Independent and D = Dependent) ADLs: I  Bathing/Dressing- I  Meal Prep- I  Eating- I  Maintaining continence- I  Transferring/Ambulation- I  Managing Meds- I  Follow up appointments reviewed:  PCP Hospital f/u appt confirmed? No  Scheduled to see - on - @ [facility] f/u appt confirmed? Yes  Scheduled to see urology provider, Dr. Louis Meckel on 02/05/22 @ "Not exactly sure of time, can't remember;" verified ED instructions recommended specialists follow up within one month; verified patient has contact information for both urology providers in [place], [place] and in [place] Are transportation arrangements needed? No  If their condition worsens, is the pt aware to call PCP or go to the Emergency Dept.? Yes Was the patient provided with contact information for the PCP's office or ED? Yes Was to pt encouraged to call back with questions or concerns? Yes  SDOH assessments and interventions completed:   Yes  Care Coordination Interventions Activated:  Yes   Care Coordination Interventions:  Reviewed ED discharge instructions thoroughly with patient; placed [place] referral for food aquisition    Encounter Outcome:  Pt. Visit Completed    Oneta Rack, RN, BSN, CCRN Alumnus RN CM Care Coordination/ Transition of [place] Management [phone]: direct office

## 2022-01-10 NOTE — Telephone Encounter (Signed)
   Telephone encounter was:  Successful.  01/10/2022 Name: Eric [name] MRN: [id number] DOB: January 14, 1941  Eric [name] is a 81 y.o. year old male who is a primary care patient of Sagardia, Ines Bloomer, MD . The community resource team was consulted for assistance with [place] guide performed the following interventions: Patient provided with information about care guide support team and interviewed to confirm resource needs Follow up call placed to the patient to discuss status of referral. patient put on [facility] waitlist provided application to get [facility] nutrition also emailed and mailed food banks as well as put on [place] [address]  Follow Up Plan:  No further follow up planned at this time. The patient has been provided with needed resources.  [place] [phone] [address] , [place] [address] Email : [email or web address]

## 2022-03-23 ENCOUNTER — Encounter: Payer: Self-pay | Admitting: Emergency Medicine

## 2022-03-24 ENCOUNTER — Other Ambulatory Visit: Payer: Self-pay | Admitting: Emergency Medicine

## 2022-03-24 DIAGNOSIS — I10 Essential (primary) hypertension: Secondary | ICD-10-CM

## 2022-03-24 DIAGNOSIS — E785 Hyperlipidemia, unspecified: Secondary | ICD-10-CM

## 2022-03-24 NOTE — Telephone Encounter (Signed)
Okay to get blood work and urine culture done here.  Orders put in for future labs today.

## 2022-03-26 ENCOUNTER — Emergency Department (HOSPITAL_COMMUNITY)
Admission: EM | Admit: 2022-03-26 | Discharge: 2022-03-26 | Disposition: A | Discharge status: Home / Self Care | Payer: Medicare Other | Attending: Emergency Medicine | Admitting: Emergency Medicine

## 2022-03-26 ENCOUNTER — Encounter (HOSPITAL_COMMUNITY): Payer: Self-pay

## 2022-03-26 ENCOUNTER — Other Ambulatory Visit: Payer: Self-pay

## 2022-03-26 DIAGNOSIS — Z7982 Long term (current) use of aspirin: Secondary | ICD-10-CM | POA: Diagnosis not present

## 2022-03-26 DIAGNOSIS — R1030 Lower abdominal pain, unspecified: Secondary | ICD-10-CM | POA: Diagnosis not present

## 2022-03-26 DIAGNOSIS — N3090 Cystitis, unspecified without hematuria: Secondary | ICD-10-CM

## 2022-03-26 LAB — CBC WITH DIFFERENTIAL/PLATELET
Abs Immature Granulocytes: 0.05 10*3/uL (ref 0.00–0.07)
Basophils Absolute: 0 10*3/uL (ref 0.0–0.1)
Basophils Relative: 0 %
Eosinophils Absolute: 0.1 10*3/uL (ref 0.0–0.5)
Eosinophils Relative: 1 %
HCT: 35.7 % — ABNORMAL LOW (ref 39.0–52.0)
Hemoglobin: 11.5 g/dL — ABNORMAL LOW (ref 13.0–17.0)
Immature Granulocytes: 1 %
Lymphocytes Relative: 14 %
Lymphs Abs: 1.4 10*3/uL (ref 0.7–4.0)
MCH: 27.7 pg (ref 26.0–34.0)
MCHC: 32.2 g/dL (ref 30.0–36.0)
MCV: 86 fL (ref 80.0–100.0)
Monocytes Absolute: 1.3 10*3/uL — ABNORMAL HIGH (ref 0.1–1.0)
Monocytes Relative: 14 %
Neutro Abs: 6.9 10*3/uL (ref 1.7–7.7)
Neutrophils Relative %: 70 %
Platelets: UNDETERMINED 10*3/uL (ref 150–400)
RBC: 4.15 MIL/uL — ABNORMAL LOW (ref 4.22–5.81)
RDW: 14.7 % (ref 11.5–15.5)
WBC: 9.8 10*3/uL (ref 4.0–10.5)
nRBC: 0 % (ref 0.0–0.2)

## 2022-03-26 LAB — URINALYSIS, ROUTINE W REFLEX MICROSCOPIC
Bilirubin Urine: NEGATIVE
Glucose, UA: NEGATIVE mg/dL
Ketones, ur: NEGATIVE mg/dL
Nitrite: POSITIVE — AB
Protein, ur: 30 mg/dL — AB
RBC / HPF: >50 RBC/hpf — ABNORMAL HIGH (ref 0–5)
Specific Gravity, Urine: 1.012 (ref 1.005–1.030)
WBC, UA: >50 WBC/hpf — ABNORMAL HIGH (ref 0–5)
pH: 5 (ref 5.0–8.0)

## 2022-03-26 LAB — BASIC METABOLIC PANEL
Anion gap: 9 (ref 5–15)
BUN: 13 mg/dL (ref 8–23)
CO2: 21 mmol/L — ABNORMAL LOW (ref 22–32)
Calcium: 8.8 mg/dL — ABNORMAL LOW (ref 8.9–10.3)
Chloride: 107 mmol/L (ref 98–111)
Creatinine, Ser: 1.3 mg/dL — ABNORMAL HIGH (ref 0.61–1.24)
GFR, Estimated: 55 mL/min — ABNORMAL LOW (ref >60)
Glucose, Bld: 92 mg/dL (ref 70–99)
Potassium: 4.3 mmol/L (ref 3.5–5.1)
Sodium: 137 mmol/L (ref 135–145)

## 2022-03-26 MED ORDER — CEFDINIR 300 MG PO CAPS: 300.0000 mg | ORAL_CAPSULE | Freq: Two times a day (BID) | ORAL | 0 refills | Status: AC

## 2022-03-26 MED ORDER — LIDOCAINE HCL URETHRAL/MUCOSAL 2 % EX GEL
1.0000 | Freq: Once | CUTANEOUS | Status: AC
  Administered 2022-03-26: 1 via TOPICAL
  Filled 2022-03-26: qty 11

## 2022-03-26 MED ORDER — SODIUM CHLORIDE 0.9 % IV SOLN: 1.0000 g | Freq: Once | INTRAVENOUS | Status: DC

## 2022-03-26 MED ORDER — CEFTRIAXONE SODIUM 1 G IJ SOLR
1.0000 g | Freq: Once | INTRAMUSCULAR | Status: AC
  Administered 2022-03-26: 1 g via INTRAMUSCULAR
  Filled 2022-03-26: qty 10

## 2022-03-26 NOTE — Discharge Instructions (Addendum)
You were seen in the ED for your urinary tract infection, while in the ED we exchanged your catheter. Your urine sample shows that you have a urinary tract infection. Please begin taking cefdinir 300mg  twice daily for the next 7 days.  Please follow up with your urologist in the next week to let them know you were seen in the emergency department  Sincerely, Clydie Braun, PGY-2

## 2022-03-26 NOTE — ED Notes (Signed)
Urethra catheter replaced by this RN. Urine obtained, pt tolerated well.

## 2022-03-26 NOTE — ED Provider Notes (Signed)
[facility] Provider Note   CSN: [id number] Arrival date & time: 03/26/22  0935     History  Chief Complaint  Patient presents with   Urinary Tract Infection    Eric [name] is a 82 y.o. male.  82 year old male with past medical history of BPH, CAD, hyperlipidemia, GERD, non-Hodgkin's lymphoma presents with concern for lower abdominal discomfort with indwelling Foley catheter.  States catheter was placed 2 months ago, has been unable to arrange catheter exchange locally, is managed currently in [place].  Called his [facility] urologist who requested labs and Foley exchange with urinalysis and culture with plan to follow-up with patient on Friday of this week.       Home Medications Prior to Admission medications   Medication Sig Start Date End Date Taking? Authorizing Provider  aspirin EC 81 MG tablet Take 81 mg by mouth at bedtime.    [provider]  atorvastatin (LIPITOR) 80 MG tablet TAKE 1 TABLET(80 MG) BY MOUTH DAILY 06/12/21   Belva Crome, MD  cephALEXin (KEFLEX) 500 MG capsule Take 1 capsule (500 mg total) by mouth 4 (four) times daily. 01/07/22   Blanchie Dessert, MD  diphenhydrAMINE (BENADRYL) 25 MG tablet Take 25 mg by mouth at bedtime as needed for sleep.     [provider]  ferrous sulfate (FERROUSUL) 325 (65 FE) MG tablet Take 1 tablet (325 mg total) by mouth 2 (two) times daily with a meal. 08/23/20   Kathlen Mody, Nicki Reaper T, PA-C  omeprazole (PRILOSEC) 20 MG capsule Take 20 mg by mouth daily as needed (acid relfux).    [provider]  Polyethylene Glycol 3350 (PEG 3350) POWD Take by mouth. 02/11/18   [provider]  sildenafil (VIAGRA) 100 MG tablet Take 100 mg by mouth daily as needed. 12/10/17   [provider]      Allergies    Neosporin [neomycin-bacitracin zn-polymyx]    Review of Systems   Review of Systems Negative except as per HPI Physical Exam Updated Vital  Signs BP (!) 150/44 (BP Location: Right Arm)   Pulse 73   Temp 98.1 F (36.7 C) (Oral)   Resp 16   Ht 5\' 7"  (1.702 m)   Wt 72.6 kg   SpO2 100%   BMI 25.06 kg/m  Physical Exam Vitals and nursing note reviewed.  Constitutional:      General: He is not in acute distress.    Appearance: He is well-developed. He is not diaphoretic.  HENT:     Head: Normocephalic and atraumatic.  Cardiovascular:     Rate and Rhythm: Normal rate and regular rhythm.     Heart sounds: Normal heart sounds.  Pulmonary:     Effort: Pulmonary effort is normal.     Breath sounds: Normal breath sounds.  Abdominal:     Palpations: Abdomen is soft.     Tenderness: There is no abdominal tenderness.  Skin:    General: Skin is warm and dry.     Findings: No erythema or rash.  Neurological:     Mental Status: He is alert and oriented to person, place, and time.  Psychiatric:        Behavior: Behavior normal.     ED Results / Procedures / Treatments   Labs (all labs ordered are listed, but only abnormal results are displayed) Labs Reviewed  CBC WITH DIFFERENTIAL/PLATELET - Abnormal; Notable for the following components:      Result Value  RBC 4.15 (*)    Hemoglobin 11.5 (*)    HCT 35.7 (*)    Monocytes Absolute 1.3 (*)    All other components within normal limits  BASIC METABOLIC PANEL - Abnormal; Notable for the following components:   CO2 21 (*)    Creatinine, Ser 1.30 (*)    Calcium 8.8 (*)    GFR, Estimated 55 (*)    All other components within normal limits  URINE CULTURE  URINALYSIS, ROUTINE W REFLEX MICROSCOPIC    EKG None  Radiology No results found.  Procedures Procedures    Medications Ordered in ED Medications  lidocaine (XYLOCAINE) 2 % jelly 1 Application (has no administration in time range)    ED Course/ Medical Decision Making/ A&P                             Medical Decision Making Amount and/or Complexity of Data Reviewed Labs: ordered.   82 year old male  with indwelling Foley catheter due to BPH presents with concern for lower abdominal discomfort, possible UTI.  Patient called his urologist at [facility] an.d was advised to come to the emergency room for Foley replacement and labs with urine culture with plan to follow-up in the office on Friday. Labs reviewed, CBC with normal WBC.  BMP with renal function at baseline. Foley catheter was removed, there was blood present.  Uro-Jet ordered for Foley replacement. Care signed out at time of shift pending Foley placement which nursing staff is completing at this time. Will need to hold for urinalysis, treat if indicated        Final Clinical Impression(s) / ED Diagnoses Final diagnoses:  None    Rx / DC Orders ED Discharge Orders     None         Alden Hipp 03/26/22 1522    Rondel Baton, MD 03/26/22 [phone]

## 2022-03-26 NOTE — ED Triage Notes (Signed)
Reports started having suprapibic pain 2 days ago. Has a foley catheter.  Reports has a procedure in [place] in [place] for enlarged prostate. Patient is requesting catheter be replaced.

## 2022-03-26 NOTE — ED Provider Triage Note (Signed)
Emergency Medicine Provider Triage Evaluation Note  Tim Corriher , a 82 y.o. male  was evaluated in triage.  Pt complains of suprapubic painx2 days. Concerned for possible UTI. Has catheter. Requesting back be changed.  Review of Systems  Positive: Suprapubic pain Negative: fever  Physical Exam  BP (!) 168/97 (BP Location: Right Arm)   Pulse 84   Temp 98.1 F (36.7 C) (Oral)   Resp 16   Ht 5\' 7"  (1.702 m)   Wt 72.6 kg   SpO2 100%   BMI 25.06 kg/m  Gen:   Awake, no distress   Resp:  Normal effort  MSK:   Moves extremities without difficulty    Medical Decision Making  Medically screening exam initiated at 9:49 AM.  Appropriate orders placed.  Sueo Cullen was informed that the remainder of the evaluation will be completed by another provider, this initial triage assessment does not replace that evaluation, and the importance of remaining in the ED until their evaluation is complete.    Oren Bracket, Pete Pelt 03/26/22 [phone]

## 2022-03-26 NOTE — ED Provider Notes (Signed)
  Physical Exam  BP (!) 145/74 (BP Location: Right Arm)   Pulse 78   Temp 98.1 F (36.7 C) (Oral)   Resp 16   Ht 5\' 7"  (1.702 m)   Wt 72.6 kg   SpO2 100%   BMI 25.06 kg/m   Physical Exam Vitals and nursing note reviewed.  Constitutional:      General: He is not in acute distress.    Appearance: He is well-developed.  HENT:     Head: Normocephalic and atraumatic.  Eyes:     Conjunctiva/sclera: Conjunctivae normal.  Cardiovascular:     Rate and Rhythm: Normal rate and regular rhythm.     Heart sounds: No murmur heard. Pulmonary:     Effort: Pulmonary effort is normal. No respiratory distress.     Breath sounds: Normal breath sounds.  Abdominal:     Palpations: Abdomen is soft.     Tenderness: There is no abdominal tenderness.  Musculoskeletal:        General: No swelling.     Cervical back: Neck supple.  Skin:    General: Skin is warm and dry.     Capillary Refill: Capillary refill takes less than 2 seconds.  Neurological:     Mental Status: He is alert.  Psychiatric:        Mood and Affect: Mood normal.     Procedures  Procedures  ED Course / MDM   Clinical Course as of 03/26/22 1906  Mon Mar 26, 2022  1526 Urology at [facility] w/ indwellign foley x77months. Suprapubic discomfort today. Was told to get foley exchanged here. Some bleeding with foley removal. Getting foley exchanged.   [ ]  FU UA  [ ]  FU with urology  [JS]    Clinical Course User Index [JS] Donzetta Matters, MD   Medical Decision Making On reassessment patient resting comfortably, tolerated foley placement without incident. UA pos for leukocytes, nitrites, Bac and WBC's. Previous cultures showed no evidence of resistant organisms. We therefore opted to treat with a single dose of ceftriaxone and dc'd the patient on Cefdinir BID x7 days. Patient had no acute complaints and with follow-up with urology in the outpatient setting.   Amount and/or Complexity of Data Reviewed Labs:  ordered.  Risk Prescription drug management.          Donzetta Matters, MD 03/27/22 [id number]    Drenda Freeze, MD 03/29/22 [phone]

## 2022-03-26 NOTE — ED Notes (Signed)
Went in with the nurse tech Trula Ore I took out the patient foley leg bag and the patient penis started bleeding clots came out placed a glaze and noticed the RN of the bleeding

## 2022-03-29 ENCOUNTER — Telehealth: Payer: Self-pay

## 2022-03-29 LAB — URINE CULTURE: Culture: >=100000 — AB

## 2022-03-29 NOTE — Telephone Encounter (Signed)
     Patient  visit on 03/26/2022  at [facility]. [facility] was for Cystitis.  Have you been able to follow up with your primary care physician? Yes  The patient was or was not able to obtain any needed medicine or equipment. Patient obtained medication.  Are there diet recommendations that you are having difficulty following? No  Patient expresses understanding of discharge instructions and education provided has no other needs at this time.    [facility] Care Guide   [email or web address]  ?? [phone]   Website: [email or web address]  [email or web address]

## 2022-03-30 ENCOUNTER — Telehealth (HOSPITAL_BASED_OUTPATIENT_CLINIC_OR_DEPARTMENT_OTHER): Payer: Self-pay

## 2022-03-30 NOTE — Telephone Encounter (Signed)
Post ED Visit - Positive Culture Follow-up  Culture report reviewed by antimicrobial stewardship pharmacist: [place] Team []  Elenor Quinones, Pharm.D. []  Heide Guile, Pharm.D., BCPS AQ-ID []  Parks Neptune, Pharm.D., BCPS []  Alycia Rossetti, Pharm.D., BCPS []  Trego, Pharm.D., BCPS, AAHIVP []  Legrand Como, Pharm.D., BCPS, AAHIVP [x]  Salome Arnt, PharmD, BCPS []  Johnnette Gourd, PharmD, BCPS []  Hughes Better, PharmD, BCPS []  Leeroy Cha, PharmD []  Laqueta Linden, PharmD, BCPS []  Albertina Parr, PharmD  [place] Team []  Leodis Sias, PharmD []  Lindell Spar, PharmD []  Royetta Asal, PharmD []  Graylin Shiver, Rph []  Rema Fendt) Glennon Mac, PharmD []  Arlyn Dunning, PharmD []  Netta Cedars, PharmD []  Dia Sitter, PharmD []  Leone Haven, PharmD []  Gretta Arab, PharmD []  Theodis Shove, PharmD []  Peggyann Juba, PharmD []  Reuel Boom, PharmD   Positive urine culture Treated with Cefdinir, organism sensitive to the same and no further patient follow-up is required at this time.  Glennon Hamilton 03/30/2022, 8:09 AM

## 2022-04-04 DIAGNOSIS — N401 Enlarged prostate with lower urinary tract symptoms: Secondary | ICD-10-CM | POA: Diagnosis not present

## 2022-04-04 DIAGNOSIS — N138 Other obstructive and reflux uropathy: Secondary | ICD-10-CM | POA: Diagnosis not present

## 2022-04-04 DIAGNOSIS — R339 Retention of urine, unspecified: Secondary | ICD-10-CM | POA: Diagnosis not present

## 2022-04-09 DIAGNOSIS — Z7982 Long term (current) use of aspirin: Secondary | ICD-10-CM | POA: Diagnosis not present

## 2022-04-09 DIAGNOSIS — Z8572 Personal history of non-Hodgkin lymphomas: Secondary | ICD-10-CM | POA: Diagnosis not present

## 2022-04-09 DIAGNOSIS — Z79899 Other long term (current) drug therapy: Secondary | ICD-10-CM | POA: Diagnosis not present

## 2022-04-09 DIAGNOSIS — E785 Hyperlipidemia, unspecified: Secondary | ICD-10-CM | POA: Diagnosis not present

## 2022-04-09 DIAGNOSIS — N401 Enlarged prostate with lower urinary tract symptoms: Secondary | ICD-10-CM | POA: Diagnosis not present

## 2022-04-09 DIAGNOSIS — I10 Essential (primary) hypertension: Secondary | ICD-10-CM | POA: Diagnosis not present

## 2022-04-09 DIAGNOSIS — N400 Enlarged prostate without lower urinary tract symptoms: Secondary | ICD-10-CM | POA: Diagnosis not present

## 2022-04-09 DIAGNOSIS — R338 Other retention of urine: Secondary | ICD-10-CM | POA: Diagnosis not present

## 2022-04-09 DIAGNOSIS — N138 Other obstructive and reflux uropathy: Secondary | ICD-10-CM | POA: Diagnosis not present

## 2022-04-09 DIAGNOSIS — I2510 Atherosclerotic heart disease of native coronary artery without angina pectoris: Secondary | ICD-10-CM | POA: Diagnosis not present

## 2022-04-09 DIAGNOSIS — K219 Gastro-esophageal reflux disease without esophagitis: Secondary | ICD-10-CM | POA: Diagnosis not present

## 2022-04-09 DIAGNOSIS — I252 Old myocardial infarction: Secondary | ICD-10-CM | POA: Diagnosis not present

## 2022-04-09 DIAGNOSIS — Z955 Presence of coronary angioplasty implant and graft: Secondary | ICD-10-CM | POA: Diagnosis not present

## 2022-04-10 DIAGNOSIS — Z978 Presence of other specified devices: Secondary | ICD-10-CM | POA: Diagnosis not present

## 2022-04-13 ENCOUNTER — Telehealth: Payer: Self-pay | Admitting: Interventional Cardiology

## 2022-04-13 NOTE — Telephone Encounter (Signed)
Pt c/o swelling: STAT is pt has developed SOB within 24 hours  How much weight have you gained and in what time span?  No   If swelling, where is the swelling located?  Ankles   Are you currently taking a fluid pill?  No   Are you currently SOB?  No   Do you have a log of your daily weights (if so, list)?   Have you gained 3 pounds in a day or 5 pounds in a week?   Have you traveled recently?  No   Patient also mentions that his BP is low and HR is elevated.  Pt c/o BP issue: STAT if pt c/o blurred vision, one-sided weakness or slurred speech  1. What are your last 5 BP readings?  116/50 100  2. Are you having any other symptoms (ex. Dizziness, headache, blurred vision, passed out)?  Swelling  3. What is your BP issue?  BP is low and HR is elevated

## 2022-04-13 NOTE — Telephone Encounter (Signed)
Patient has concerns regarding lower BP reading today. It was 116/50. On recheck it was 130/80. Patient does not currently take medication for hypertension. Patient was last seen by Dr Tamala Julian 04/24/2021. Advised him he is due for annual follow up. Patient is going to call back in to schedule appointment.

## 2022-05-03 ENCOUNTER — Encounter: Payer: Self-pay | Admitting: Interventional Cardiology

## 2022-05-03 ENCOUNTER — Ambulatory Visit: Payer: Medicare Other | Attending: Interventional Cardiology | Admitting: Interventional Cardiology

## 2022-05-03 VITALS — BP 118/78 | HR 96 | Ht 67.0 in | Wt 151.8 lb

## 2022-05-03 DIAGNOSIS — I25118 Atherosclerotic heart disease of native coronary artery with other forms of angina pectoris: Secondary | ICD-10-CM | POA: Diagnosis not present

## 2022-05-03 DIAGNOSIS — I252 Old myocardial infarction: Secondary | ICD-10-CM | POA: Diagnosis not present

## 2022-05-03 DIAGNOSIS — R0609 Other forms of dyspnea: Secondary | ICD-10-CM

## 2022-05-03 DIAGNOSIS — E782 Mixed hyperlipidemia: Secondary | ICD-10-CM

## 2022-05-03 NOTE — Patient Instructions (Addendum)
Medication Instructions:  Your physician recommends that you continue on your current medications as directed. Please refer to the Current Medication list given to you today.  *If you need a refill on your cardiac medications before your next appointment, please call your pharmacy*   Lab Work: none If you have labs (blood work) drawn today and your tests are completely normal, you will receive your results only by: [place] (if you have MyChart) OR A paper copy in the mail If you have any lab test that is abnormal or we need to change your treatment, we will call you to review the results.   Testing/Procedures: Dr Irish Lack recommends you have a Cardiac PET CT Scan   Follow-Up: At [facility], you and your health needs are our priority.  As part of our continuing mission to provide you with exceptional heart care, we have created designated Provider Care Teams.  These Care Teams include your primary Cardiologist (physician) and Advanced Practice Providers (APPs -  Physician Assistants and Nurse Practitioners) who all work together to provide you with the care you need, when you need it.  We recommend signing up for the patient portal called "MyChart".  Sign up information is provided on this After Visit Summary.  MyChart is used to connect with patients for Virtual Visits (Telemedicine).  Patients are able to view lab/test results, encounter notes, upcoming appointments, etc.  Non-urgent messages can be sent to your provider as well.   To learn more about what you can do with MyChart, go to [email or web address].    Your next appointment:   12 month(s)  Provider:   Larae Grooms, MD     Other Instructions  How to Prepare for Your Cardiac PET/CT Stress Test:  1. Please do not take these medications before your test:   Medications that may interfere with the cardiac pharmacological stress agent (ex. nitrates - including erectile dysfunction medications,  isosorbide mononitrate, tamulosin or beta-blockers) the day of the exam. (Erectile dysfunction medication should be held for at least 72 hrs prior to test) Theophylline containing medications for 12 hours. Dipyridamole 48 hours prior to the test. Your remaining medications may be taken with water.  2. Nothing to eat or drink, except water, 3 hours prior to arrival time.   NO caffeine/decaffeinated products, or chocolate 12 hours prior to arrival.  3. NO perfume, cologne or lotion  4. Total time is 1 to 2 hours; you may want to bring reading material for the waiting time.  5. Please report to Radiology at the [facility] Main Entrance 30 minutes early for your test.  [place], [place] [id number]  In preparation for your appointment, medication and supplies will be purchased.  Appointment availability is limited, so if you need to cancel or reschedule, please call the Radiology Department at [phone]  24 hours in advance to avoid a cancellation fee of $100.00  What to Expect After you Arrive:  Once you arrive and check in for your appointment, you will be taken to a preparation room within the Radiology Department.  A technologist or Nurse will obtain your medical history, verify that you are correctly prepped for the exam, and explain the procedure.  Afterwards,  an IV will be started in your arm and electrodes will be placed on your skin for EKG monitoring during the stress portion of the exam. Then you will be escorted to the PET/CT scanner.  There, staff will get you positioned on the  scanner and obtain a blood pressure and EKG.  During the exam, you will continue to be connected to the EKG and blood pressure machines.  A small, safe amount of a radioactive tracer will be injected in your IV to obtain a series of pictures of your heart along with an injection of a stress agent.    After your Exam:  It is recommended that you eat a meal and drink a caffeinated  beverage to counter act any effects of the stress agent.  Drink plenty of fluids for the remainder of the day and urinate frequently for the first couple of hours after the exam.  Your doctor will inform you of your test results within 7-10 business days.  For questions about your test or how to prepare for your test, please call: Marchia Bond, Cardiac Imaging Nurse Navigator  Gordy Clement, Cardiac Imaging Nurse Navigator Office: [phone]

## 2022-05-03 NOTE — Progress Notes (Signed)
Cardiology Office Note   Date:  05/03/2022   ID:  Eric [name], DOB [birth year], MRN [id number]  PCP:  Horald Pollen, MD    No chief complaint on file.  CAD  Wt Readings from Last 3 Encounters:  05/03/22 151 lb 12.8 oz (68.9 kg)  03/26/22 160 lb (72.6 kg)  01/07/22 160 lb (72.6 kg)       History of Present Illness: Eric [name] is a 82 y.o. male  with a hx of non- Hodgkin's follicular lymphoma, CAD with RCA DES 2013, clopidogrel hypo-responder, prior MI, and hyperlipidemia.   No heart issues since 2013.  Angina was a pain in the chest.  He went to ER.    2013 PCI,  Promus premier3.0 x 38 mm and 3.0 x 28 mm stents overlapping, postdilated with 3.25 mm Lynd balloon.   Had surgery on prostate in 2024.  He reports some exertional shortness of breath after the surgery.  Feels that it is getting better.          Past Medical History:  Diagnosis Date   Cancer [facility])    Coronary atherosclerosis of native coronary artery    a. s/p MI with DES/PCI to the RCA in 2013.   Encounter for antineoplastic chemotherapy 06/20/2015   Essential hypertension    GERD (gastroesophageal reflux disease)    History of heart attack    Hyperlipidemia    Ischemic cardiomyopathy    Myocardial infarction ([place])    Non Hodgkin's lymphoma (HCC)    Plavix resistance    Plavix hyporesponder 2013   Sinus bradycardia    baseline HR 50s at times    Past Surgical History:  Procedure Laterality Date   CORONARY STENT PLACEMENT     LEFT HEART CATHETERIZATION WITH CORONARY ANGIOGRAM N/A 03/03/2012   Procedure: LEFT HEART CATHETERIZATION WITH CORONARY ANGIOGRAM;  Surgeon: [name], MD;  Location: [facility] CATH LAB;  Service: Cardiovascular;  Laterality: N/A;   PERCUTANEOUS CORONARY STENT INTERVENTION (PCI-S) N/A 03/04/2012   Procedure: PERCUTANEOUS CORONARY STENT INTERVENTION (PCI-S);  Surgeon: [name], MD;  Location: [facility] CATH LAB;  Service: Cardiovascular;  Laterality: N/A;      Current Outpatient Medications  Medication Sig Dispense Refill   aspirin EC 81 MG tablet Take 81 mg by mouth at bedtime.     atorvastatin (LIPITOR) 80 MG tablet TAKE 1 TABLET(80 MG) BY MOUTH DAILY 90 tablet 3   diphenhydrAMINE (BENADRYL) 25 MG tablet Take 25 mg by mouth at bedtime as needed for sleep.      Docusate Sodium (DSS) 100 MG CAPS Take by mouth.     ferrous sulfate (FERROUSUL) 325 (65 FE) MG tablet Take 1 tablet (325 mg total) by mouth 2 (two) times daily with a meal. 180 tablet 3   losartan (COZAAR) 100 MG tablet Take 100 mg by mouth daily.     omeprazole (PRILOSEC) 20 MG capsule Take 20 mg by mouth daily as needed (acid relfux).     Polyethylene Glycol 3350 (PEG 3350) POWD Take by mouth.     sildenafil (VIAGRA) 100 MG tablet Take 100 mg by mouth daily as needed.  3   No current facility-administered medications for this visit.    Allergies:   Neosporin [neomycin-bacitracin zn-polymyx]    Social History:  The patient  reports that he has never smoked. He has never used smokeless tobacco. He reports current alcohol use. He reports that he does not use drugs.   Family History:  The patient's  family history includes Leukemia in his mother.    ROS:  Please see the history of present illness.   Otherwise, review of systems are positive for DOE.   All other systems are reviewed and negative.    PHYSICAL EXAM: VS:  BP 118/78   Pulse 96   Ht '5\' 7"'$  (1.702 m)   Wt 151 lb 12.8 oz (68.9 kg)   SpO2 97%   BMI 23.78 kg/m  , BMI Body mass index is 23.78 kg/m. GEN: Well nourished, well developed, in no acute distress HEENT: normal Neck: no JVD, carotid bruits, or masses Cardiac: RRR; no murmurs, rubs, or gallops,no edema  Respiratory:  clear to auscultation bilaterally, normal work of breathing GI: soft, nontender, nondistended, + BS MS: no deformity or atrophy Skin: warm and dry, no rash Neuro:  Strength and sensation are intact Psych: euthymic mood, full  affect   EKG:   The ekg ordered today demonstrates NSR, no ST changes   Recent Labs: 12/29/2021: ALT 24 03/26/2022: BUN 13; Creatinine, Ser 1.30; Hemoglobin 11.5; Platelets PLATELET CLUMPS NOTED ON SMEAR, UNABLE TO ESTIMATE; Potassium 4.3; Sodium 137   Lipid Panel    Component Value Date/Time   CHOL 122 10/31/2021 1124   CHOL 140 11/23/2020 0812   TRIG 61.0 10/31/2021 1124   HDL 40.60 10/31/2021 1124   HDL 49 11/23/2020 0812   CHOLHDL 3 10/31/2021 1124   VLDL 12.2 10/31/2021 1124   LDLCALC 69 10/31/2021 1124   LDLCALC 76 11/23/2020 0812     Other studies Reviewed: Additional studies/ records that were reviewed today with results demonstrating: labs reviewed.   ASSESSMENT AND PLAN:  CAD/old MI: DES in 2013. Overlapping Has some DOE.  Plan for PET/CT to eval for ischemia. COntinue medical therapy.  It has been 10 years since his last stress test.  Dyspnea on exertion could be an anginal equivalent.  Not a candidate for CTA due to prior stenting.  Not able to do the treadmill due to recent prostate surgery. Hyperlipidemia: LDL 69. Continue high dose atorvastatin.  Hypertension: BP stable on no meds. PreDM: A1C 6.2.  Whole food, plant based diet.  Continue regular exercise as well.   Current medicines are reviewed at length with the patient today.  The patient concerns regarding his medicines were addressed.  The following changes have been made:  No change  Labs/ tests ordered today include:  No orders of the defined types were placed in this encounter.   Recommend 150 minutes/week of aerobic exercise Low fat, low carb, high fiber diet recommended  Disposition:   FU in 1 year or sooner based on stress test   Signed, Larae Grooms, MD  05/03/2022 9:22 AM    [facility] [address], [place], [place]  [address] Phone: [phone]; Fax: [phone]

## 2022-05-07 ENCOUNTER — Other Ambulatory Visit: Payer: Self-pay

## 2022-05-07 MED ORDER — LOSARTAN POTASSIUM 100 MG PO TABS: 100.0000 mg | ORAL_TABLET | Freq: Every day | ORAL | 3 refills | Status: AC

## 2022-05-22 ENCOUNTER — Telehealth: Payer: Self-pay | Admitting: Internal Medicine

## 2022-05-22 NOTE — Telephone Encounter (Signed)
Called patient regarding April appointments, patient is notified. 

## 2022-06-26 ENCOUNTER — Telehealth (HOSPITAL_COMMUNITY): Payer: Self-pay | Admitting: Emergency Medicine

## 2022-06-26 NOTE — Telephone Encounter (Signed)
Reaching out to patient to offer assistance regarding upcoming cardiac imaging study; pt verbalizes understanding of appt date/time, parking situation and where to check in, pre-test NPO status and medications ordered, and verified current allergies; name and call back number provided for further questions should they arise Miakoda Mcmillion RN Navigator Cardiac Imaging [facility] [phone] office [phone] cell 

## 2022-06-27 ENCOUNTER — Ambulatory Visit (HOSPITAL_COMMUNITY)
Admission: RE | Admit: 2022-06-27 | Discharge: 2022-06-27 | Disposition: A | Discharge status: Home / Self Care | Payer: Medicare Other | Source: Ambulatory Visit | Attending: Interventional Cardiology | Admitting: Interventional Cardiology

## 2022-06-27 DIAGNOSIS — R0609 Other forms of dyspnea: Secondary | ICD-10-CM | POA: Diagnosis not present

## 2022-06-27 DIAGNOSIS — I25118 Atherosclerotic heart disease of native coronary artery with other forms of angina pectoris: Secondary | ICD-10-CM | POA: Insufficient documentation

## 2022-06-27 LAB — NM PET CT CARDIAC PERFUSION MULTI W/ABSOLUTE BLOODFLOW
MBFR: 1.75
Nuc Rest EF: 55 %
Nuc Stress EF: 67 %
Rest MBF: 0.95 ml/g/min
Rest Nuclear Isotope Dose: 19.2 mCi
ST Depression (mm): 0 mm
Stress MBF: 1.66 ml/g/min
Stress Nuclear Isotope Dose: 19 mCi

## 2022-06-27 MED ORDER — RUBIDIUM RB82 GENERATOR (RUBYFILL)
19.0000 | PACK | Freq: Once | INTRAVENOUS | Status: AC
  Administered 2022-06-27: 18.97 via INTRAVENOUS

## 2022-06-27 MED ORDER — REGADENOSON 0.4 MG/5ML IV SOLN
0.4000 mg | Freq: Once | INTRAVENOUS | Status: AC
  Administered 2022-06-27: 0.4 mg via INTRAVENOUS

## 2022-06-27 MED ORDER — RUBIDIUM RB82 GENERATOR (RUBYFILL)
13.0000 | PACK | Freq: Once | INTRAVENOUS | Status: AC
  Administered 2022-06-27: 19.22 via INTRAVENOUS

## 2022-06-27 MED ORDER — REGADENOSON 0.4 MG/5ML IV SOLN
INTRAVENOUS | Status: AC
  Filled 2022-06-27: qty 5

## 2022-07-02 ENCOUNTER — Telehealth: Payer: Self-pay | Admitting: *Deleted

## 2022-07-02 NOTE — Telephone Encounter (Signed)
-----   Message from Corky Crafts, MD sent at 06/29/2022  8:42 PM EDT ----- Equivocal stress test as to whether there is significant large vessel CAD.  LV function is normal.  I called the patient on 4/26 but got voicemail and mailbox was full.  Depending on his sx, will have to consider cardiac cath vs. Continued medical therapy.

## 2022-07-02 NOTE — Telephone Encounter (Signed)
Triage placed call to patient this AM but voicemail box is full.  Will send message to patient through my chart asking him to contact office

## 2022-07-03 ENCOUNTER — Inpatient Hospital Stay: Payer: Medicare Other | Admitting: Internal Medicine

## 2022-07-03 ENCOUNTER — Other Ambulatory Visit: Payer: Self-pay

## 2022-07-03 ENCOUNTER — Inpatient Hospital Stay: Payer: Medicare Other | Attending: Internal Medicine

## 2022-07-03 ENCOUNTER — Encounter: Payer: Self-pay | Admitting: Internal Medicine

## 2022-07-03 VITALS — BP 114/64 | HR 64 | Temp 97.2°F | Resp 13 | Wt 162.6 lb

## 2022-07-03 DIAGNOSIS — Z9079 Acquired absence of other genital organ(s): Secondary | ICD-10-CM | POA: Diagnosis not present

## 2022-07-03 DIAGNOSIS — C8208 Follicular lymphoma grade I, lymph nodes of multiple sites: Secondary | ICD-10-CM

## 2022-07-03 DIAGNOSIS — C8288 Other types of follicular lymphoma, lymph nodes of multiple sites: Secondary | ICD-10-CM | POA: Diagnosis not present

## 2022-07-03 DIAGNOSIS — I2510 Atherosclerotic heart disease of native coronary artery without angina pectoris: Secondary | ICD-10-CM | POA: Insufficient documentation

## 2022-07-03 DIAGNOSIS — I252 Old myocardial infarction: Secondary | ICD-10-CM | POA: Diagnosis not present

## 2022-07-03 DIAGNOSIS — D509 Iron deficiency anemia, unspecified: Secondary | ICD-10-CM | POA: Insufficient documentation

## 2022-07-03 DIAGNOSIS — I10 Essential (primary) hypertension: Secondary | ICD-10-CM | POA: Diagnosis not present

## 2022-07-03 DIAGNOSIS — R5383 Other fatigue: Secondary | ICD-10-CM | POA: Insufficient documentation

## 2022-07-03 DIAGNOSIS — Z79899 Other long term (current) drug therapy: Secondary | ICD-10-CM | POA: Diagnosis not present

## 2022-07-03 LAB — CMP (CANCER CENTER ONLY)
ALT: 14 U/L (ref 0–44)
AST: 22 U/L (ref 15–41)
Albumin: 4.2 g/dL (ref 3.5–5.0)
Alkaline Phosphatase: 103 U/L (ref 38–126)
Anion gap: 8 (ref 5–15)
BUN: 27 mg/dL — ABNORMAL HIGH (ref 8–23)
CO2: 24 mmol/L (ref 22–32)
Calcium: 9.5 mg/dL (ref 8.9–10.3)
Chloride: 108 mmol/L (ref 98–111)
Creatinine: 1.5 mg/dL — ABNORMAL HIGH (ref 0.61–1.24)
GFR, Estimated: 46 mL/min — ABNORMAL LOW (ref >60)
Glucose, Bld: 78 mg/dL (ref 70–99)
Potassium: 4.4 mmol/L (ref 3.5–5.1)
Sodium: 140 mmol/L (ref 135–145)
Total Bilirubin: 0.3 mg/dL (ref 0.3–1.2)
Total Protein: 6.5 g/dL (ref 6.5–8.1)

## 2022-07-03 LAB — CBC WITH DIFFERENTIAL (CANCER CENTER ONLY)
Abs Immature Granulocytes: 0 10*3/uL (ref 0.00–0.07)
Basophils Absolute: 0.1 10*3/uL (ref 0.0–0.1)
Basophils Relative: 2 %
Eosinophils Absolute: 0.2 10*3/uL (ref 0.0–0.5)
Eosinophils Relative: 6 %
HCT: 35.4 % — ABNORMAL LOW (ref 39.0–52.0)
Hemoglobin: 10.8 g/dL — ABNORMAL LOW (ref 13.0–17.0)
Immature Granulocytes: 0 %
Lymphocytes Relative: 49 %
Lymphs Abs: 2 10*3/uL (ref 0.7–4.0)
MCH: 24.2 pg — ABNORMAL LOW (ref 26.0–34.0)
MCHC: 30.5 g/dL (ref 30.0–36.0)
MCV: 79.4 fL — ABNORMAL LOW (ref 80.0–100.0)
Monocytes Absolute: 0.7 10*3/uL (ref 0.1–1.0)
Monocytes Relative: 17 %
Neutro Abs: 1.1 10*3/uL — ABNORMAL LOW (ref 1.7–7.7)
Neutrophils Relative %: 26 %
Platelet Count: 275 10*3/uL (ref 150–400)
RBC: 4.46 MIL/uL (ref 4.22–5.81)
RDW: 18.7 % — ABNORMAL HIGH (ref 11.5–15.5)
WBC Count: 4.1 10*3/uL (ref 4.0–10.5)
nRBC: 0 % (ref 0.0–0.2)

## 2022-07-03 LAB — LACTATE DEHYDROGENASE: LDH: 158 U/L (ref 98–192)

## 2022-07-03 NOTE — Progress Notes (Signed)
[facility] [phone]   [phone]  OFFICE PROGRESS NOTE  DIAGNOSIS: Bulky stage IV low-grade lymphoma diagnosed in November 2009.  PRIOR THERAPY: Status post 7 cycles of systemic chemotherapy with CHOP/Rituxan. Last dose was given May 2010. Status post 6 cycles of maintenance Rituxan 375 mg/sq m given every 2 months. Last dose was given on May 29, 2010, and the patient was lost to followup at that time. He received another dose on 12/11/2010, then again missed 2 doses. systemic chemotherapy with Rituxan 375 mg/M2 on day 1 and that bendamustine 90 mg/M2 on days 1 and 2 every 4 weeks, status post 6 cycles. Maintenance Rituxan 375 mg/M2 every 2 months, status post 12 cycles  Rituxan 375 MG/M2 given weekly for 4 weeks and this is followed by maintenance treatment every 2 months, status post 22 cycles discontinued secondary to disease progression. Systemic chemotherapy with bendamustine 90 mg/M2 on days 1 and 2 and Rituxan 375 mg/M2 on day 1 every 4 weeks.  First dose March 03, 2018.  Status post 4 cycles.  CURRENT THERAPY: Observation.  INTERVAL HISTORY: Eric [name] 82 y.o. male returns to the clinic today for 6 months follow-up visit.  The patient is feeling fine today with no concerning complaints except for mild fatigue.  He was seen by urologist at [facility] Dr. Al Corpus and underwent surgical resection and debulking of the prostate.  He denied having any significant chest pain, shortness of breath, cough or hemoptysis.  He has no nausea, vomiting, diarrhea or constipation.  He has no headache or visual changes.  He has no palpable lymphadenopathy.  He is here today for evaluation and repeat blood work.  MEDICAL HISTORY: Past Medical History:  Diagnosis Date   Cancer [facility])    Coronary atherosclerosis of native coronary artery    a. s/p MI with DES/PCI to the RCA in 2013.   Encounter for antineoplastic chemotherapy 06/20/2015    Essential hypertension    GERD (gastroesophageal reflux disease)    History of heart attack    Hyperlipidemia    Ischemic cardiomyopathy    Myocardial infarction (HCC)    Non Hodgkin's lymphoma (HCC)    Plavix resistance    Plavix hyporesponder 2013   Sinus bradycardia    baseline HR 50s at times    ALLERGIES:  is allergic to neosporin [neomycin-bacitracin zn-polymyx].  MEDICATIONS:  Current Outpatient Medications  Medication Sig Dispense Refill   aspirin EC 81 MG tablet Take 81 mg by mouth at bedtime.     atorvastatin (LIPITOR) 80 MG tablet TAKE 1 TABLET(80 MG) BY MOUTH DAILY 90 tablet 3   diphenhydrAMINE (BENADRYL) 25 MG tablet Take 25 mg by mouth at bedtime as needed for sleep.      ferrous sulfate (FERROUSUL) 325 (65 FE) MG tablet Take 1 tablet (325 mg total) by mouth 2 (two) times daily with a meal. 180 tablet 3   losartan (COZAAR) 100 MG tablet Take 1 tablet (100 mg total) by mouth daily. 90 tablet 3   omeprazole (PRILOSEC) 20 MG capsule Take 20 mg by mouth daily as needed (acid relfux).     Polyethylene Glycol 3350 (PEG 3350) POWD Take by mouth.     sildenafil (VIAGRA) 100 MG tablet Take 100 mg by mouth daily as needed.  3   No current facility-administered medications for this visit.    SURGICAL HISTORY:  Past Surgical History:  Procedure Laterality Date   CORONARY STENT PLACEMENT  LEFT HEART CATHETERIZATION WITH CORONARY ANGIOGRAM N/A 03/03/2012   Procedure: LEFT HEART CATHETERIZATION WITH CORONARY ANGIOGRAM;  Surgeon: Donato Schultz, MD;  Location: [facility] CATH LAB;  Service: Cardiovascular;  Laterality: N/A;   PERCUTANEOUS CORONARY STENT INTERVENTION (PCI-S) N/A 03/04/2012   Procedure: PERCUTANEOUS CORONARY STENT INTERVENTION (PCI-S);  Surgeon: Lesleigh Noe, MD;  Location: [facility] CATH LAB;  Service: Cardiovascular;  Laterality: N/A;    REVIEW OF SYSTEMS:  A comprehensive review of systems was negative except for: Constitutional: positive for fatigue   PHYSICAL  EXAMINATION: General appearance: alert, cooperative, fatigued, and no distress Head: Normocephalic, without obvious abnormality, atraumatic Neck: no adenopathy, no JVD, supple, symmetrical, trachea midline, and thyroid not enlarged, symmetric, no tenderness/mass/nodules Lymph nodes: Cervical, supraclavicular, and axillary nodes normal. Resp: clear to auscultation bilaterally Back: symmetric, no curvature. ROM normal. No CVA tenderness. Cardio: regular rate and rhythm, S1, S2 normal, no murmur, click, rub or gallop GI: soft, non-tender; bowel sounds normal; no masses,  no organomegaly Extremities: extremities normal, atraumatic, no cyanosis or edema  ECOG PERFORMANCE STATUS: 1 - Symptomatic but completely ambulatory  Blood pressure 114/64, pulse 64, temperature (!) 97.2 F (36.2 C), temperature source Temporal, resp. rate 13, weight 162 lb 9.6 oz (73.8 kg), SpO2 100 %.  LABORATORY DATA: Lab Results  Component Value Date   WBC 4.1 07/03/2022   HGB 10.8 (L) 07/03/2022   HCT 35.4 (L) 07/03/2022   MCV 79.4 (L) 07/03/2022   PLT 275 07/03/2022      Chemistry      Component Value Date/Time   NA 137 03/26/2022 1126   NA 141 08/19/2020 0957   NA 140 02/14/2017 0804   K 4.3 03/26/2022 1126   K 4.4 02/14/2017 0804   CL 107 03/26/2022 1126   CL 106 08/06/2012 1244   CO2 21 (L) 03/26/2022 1126   CO2 22 02/14/2017 0804   BUN 13 03/26/2022 1126   BUN 20 08/19/2020 0957   BUN 14.8 02/14/2017 0804   CREATININE 1.30 (H) 03/26/2022 1126   CREATININE 1.40 (H) 12/29/2021 1331   CREATININE 1.1 02/14/2017 0804      Component Value Date/Time   CALCIUM 8.8 (L) 03/26/2022 1126   CALCIUM 9.2 02/14/2017 0804   ALKPHOS 122 12/29/2021 1331   ALKPHOS 138 02/14/2017 0804   AST 25 12/29/2021 1331   AST 29 02/14/2017 0804   ALT 24 12/29/2021 1331   ALT 27 02/14/2017 0804   BILITOT 0.5 12/29/2021 1331   BILITOT 0.61 02/14/2017 0804       RADIOGRAPHIC STUDIES: NM PET CT CARDIAC PERFUSION MULTI  W/ABSOLUTE BLOODFLOW  Result Date: 06/27/2022   Perfusion images suggest small region of inferolateral ischemia.  However, myocardial blood flow reserve is reduced globally and in each coronary territory (except for RCA, though unreliable in setting of prior RCA stenting), suggesting multivessel CAD vs microvascular disease.  Severe coronary calcifications on CT.  No other high risk findings such as TID or drop in EF with stress.  Recommend cath to rule out multivessel obstructive CAD; if cath unremarkable, suspect microvascular disease   LV perfusion is abnormal. There is evidence of ischemia. There is evidence of infarction. Defect 1: There is a small defect with moderate reduction in uptake present in the mid to basal inferolateral location(s) that is partially reversible. There is normal wall motion in the defect area. Consistent with peri-infarct ischemia. The defect is consistent with abnormal perfusion in the LCx territory.   Rest left ventricular function is normal. Rest  EF: 55 %. Stress left ventricular function is normal. Stress EF: 67 %. End diastolic cavity size is normal. End systolic cavity size is normal.   Myocardial blood flow was computed to be 0.89ml/g/min at rest and 1.47ml/g/min at stress. Global myocardial blood flow reserve was 1.75 and was abnormal.   Coronary calcium was present on the attenuation correction CT images. Severe coronary calcifications were present. Coronary calcifications were present in the left anterior descending artery, left circumflex artery and right coronary artery distribution(s).   Findings are consistent with infarction with peri-infarct ischemia. The study is intermediate risk.   Electronically signed by Epifanio Lesches, MD EXAM: OVER-READ INTERPRETATION CARDIAC CT CHEST The following report is an over-read performed by radiologist Dr. Gaylyn Rong of [facility] on 06/27/2022. This over-read does not include interpretation of cardiac or  coronary anatomy or pathology. The cardiac and PET interpretation by the cardiologist is attached or will be attached. COMPARISON:  Chest CT 12/29/2021 FINDINGS: Extracardiac Vascular: Atherosclerotic aortic arch and descending thoracic aorta. Mediastinum: Right axillary clips. Moderate-sized type 3 hiatal hernia. No pathologic adenopathy observed. Lung: Unremarkable Included Upper Abdomen: Unremarkable Musculoskeletal: Lower thoracic spondylosis. IMPRESSION: 1. Moderate-sized type 3 hiatal hernia. 2. Lower thoracic spondylosis. 3. Aortic atherosclerosis. Aortic Atherosclerosis (ICD10-I70.0). Electronically Signed   By: Gaylyn Rong M.D.   On: 06/27/2022 10:19   ASSESSMENT AND PLAN:  This is a very pleasant 82 years old African-American male with bulky stage IV follicular lymphoma initially treated with CHOP/Rituxan followed by maintenance Rituxan. The patient had disease recurrence and he was restarted again on treatment with Rituxan every 2 months status post 22 cycles. The patient has been tolerating this treatment well with no concerning adverse effects. The patient was found to have progression in December 2019.  He was started on systemic chemotherapy with bendamustine and Rituxan status post 4 cycles.   The patient has been on observation since that time and he is feeling fine. Repeat CBC today showed mild microcytic anemia likely secondary to blood loss from the prostate surgery.  I recommended for the patient to increase his oral iron supplements to 2 tablets daily with vitamin C or orange juice.  He will reach out to his primary care physician for repeat CBC in around 2 months to see if there is improvement of his hemoglobin and hematocrit and if no improvement he may need to see gastroenterologist to rule out any gastrointestinal blood loss. The patient will come back for follow-up visit in 6 months for evaluation and repeat blood work as well as CT scan of the chest, abdomen and pelvis for  restaging of his disease. He was advised to call immediately if he has any concerning symptoms in the interval. The patient voices understanding of current disease status and treatment options and is in agreement with the current care plan. All questions were answered. The patient knows to call the clinic with any problems, questions or concerns. We can certainly see the patient much sooner if necessary.   Disclaimer: This note was dictated with voice recognition software. Similar sounding words can inadvertently be transcribed and may not be corrected upon review.

## 2022-07-11 DIAGNOSIS — Z7982 Long term (current) use of aspirin: Secondary | ICD-10-CM | POA: Diagnosis not present

## 2022-07-11 DIAGNOSIS — N138 Other obstructive and reflux uropathy: Secondary | ICD-10-CM | POA: Diagnosis not present

## 2022-07-11 DIAGNOSIS — N401 Enlarged prostate with lower urinary tract symptoms: Secondary | ICD-10-CM | POA: Diagnosis not present

## 2022-07-12 NOTE — Telephone Encounter (Signed)
Patient notified of results.  See result note on test

## 2022-07-19 ENCOUNTER — Ambulatory Visit: Payer: Medicare Other | Attending: Interventional Cardiology | Admitting: Interventional Cardiology

## 2022-07-19 ENCOUNTER — Encounter: Payer: Self-pay | Admitting: Interventional Cardiology

## 2022-07-19 VITALS — BP 128/74 | HR 73 | Ht 67.0 in | Wt 165.6 lb

## 2022-07-19 DIAGNOSIS — R7303 Prediabetes: Secondary | ICD-10-CM

## 2022-07-19 DIAGNOSIS — I10 Essential (primary) hypertension: Secondary | ICD-10-CM

## 2022-07-19 DIAGNOSIS — R0609 Other forms of dyspnea: Secondary | ICD-10-CM

## 2022-07-19 DIAGNOSIS — I25118 Atherosclerotic heart disease of native coronary artery with other forms of angina pectoris: Secondary | ICD-10-CM | POA: Diagnosis not present

## 2022-07-19 DIAGNOSIS — E782 Mixed hyperlipidemia: Secondary | ICD-10-CM

## 2022-07-19 DIAGNOSIS — I252 Old myocardial infarction: Secondary | ICD-10-CM

## 2022-07-19 MED ORDER — ROSUVASTATIN CALCIUM 40 MG PO TABS: 40.0000 mg | ORAL_TABLET | Freq: Every day | ORAL | 3 refills | Status: AC

## 2022-07-19 NOTE — Patient Instructions (Signed)
Medication Instructions:  Your physician has recommended you make the following change in your medication:  Stop Atorvastatin. Start Rosuvastatin 40 mg by mouth daily  *If you need a refill on your cardiac medications before your next appointment, please call your pharmacy*   Lab Work: Your physician recommends that you return for lab work in: about 3 months.  CBC, Lipid and liver profiles.  This will be fasting.   If you have labs (blood work) drawn today and your tests are completely normal, you will receive your results only by: MyChart Message (if you have MyChart) OR A paper copy in the mail If you have any lab test that is abnormal or we need to change your treatment, we will call you to review the results.   Testing/Procedures: none   Follow-Up: At [facility], you and your health needs are our priority.  As part of our continuing mission to provide you with exceptional heart care, we have created designated Provider Care Teams.  These Care Teams include your primary Cardiologist (physician) and Advanced Practice Providers (APPs -  Physician Assistants and Nurse Practitioners) who all work together to provide you with the care you need, when you need it.  We recommend signing up for the patient portal called "MyChart".  Sign up information is provided on this After Visit Summary.  MyChart is used to connect with patients for Virtual Visits (Telemedicine).  Patients are able to view lab/test results, encounter notes, upcoming appointments, etc.  Non-urgent messages can be sent to your provider as well.   To learn more about what you can do with MyChart, go to [email or web address].    Your next appointment:   6 month(s)  Provider:   Lance Muss, MD     Other Instructions

## 2022-07-19 NOTE — Progress Notes (Signed)
Cardiology Office Note   Date:  07/19/2022   ID:  Eric [name], DOB 1940-07-03, MRN [id number]  PCP:  Georgina Quint, MD    No chief complaint on file.  CAD  Wt Readings from Last 3 Encounters:  07/19/22 165 lb 9.6 oz (75.1 kg)  07/03/22 162 lb 9.6 oz (73.8 kg)  06/27/22 160 lb (72.6 kg)       History of Present Illness: Eric [name] is a 82 y.o. male  former patient of Dr. Katrinka Blazing with a hx of non- Hodgkin's follicular lymphoma, CAD with RCA DES 2013, clopidogrel hypo-responder, prior MI, and hyperlipidemia.    No heart issues since 2013.  Angina was a pain in the chest.  He went to ER.     2013 PCI,  Promus premier3.0 x 38 mm and 3.0 x 28 mm stents overlapping, postdilated with 3.25 mm Claude balloon.    Had surgery on prostate in 2024.  He reports some exertional shortness of breath after the surgery.  Feels that it is getting better.     PET/CT stress test done in April 2024 showed: "Perfusion images suggest small region of inferolateral ischemia.  However, myocardial blood flow reserve is reduced globally and in each coronary territory (except for RCA, though unreliable in setting of prior RCA stenting), suggesting multivessel CAD vs microvascular disease.  Severe coronary calcifications on CT.  No other high risk findings such as TID or drop in EF with stress.  Recommend cath to rule out multivessel obstructive CAD; if cath unremarkable, suspect microvascular disease   LV perfusion is abnormal. There is evidence of ischemia. There is evidence of infarction. Defect 1: There is a small defect with moderate reduction in uptake present in the mid to basal inferolateral location(s) that is partially reversible. There is normal wall motion in the defect area. Consistent with peri-infarct ischemia. The defect is consistent with abnormal perfusion in the LCx territory.   Rest left ventricular function is normal. Rest EF: 55 %. Stress left ventricular function is normal. Stress  EF: 67 %. End diastolic cavity size is normal. End systolic cavity size is normal.   Myocardial blood flow was computed to be 0.47ml/g/min at rest and 1.71ml/g/min at stress. Global myocardial blood flow reserve was 1.75 and was abnormal.   Coronary calcium was present on the attenuation correction CT images. Severe coronary calcifications were present. Coronary calcifications were present in the left anterior descending artery, left circumflex artery and right coronary artery distribution(s).   Findings are consistent with infarction with peri-infarct ischemia. The study is intermediate risk."  Due to the question of abnormal myocardial blood flow reserve, cardiac cath was to be considered.  He feels that he is getting better after his prostate procedure a few months ago.  He has been anemic since that time.  He is taking iron supplementation.   Denies : Chest pain. Dizziness. Leg edema. Nitroglycerin use. Orthopnea. Palpitations. Paroxysmal nocturnal dyspnea. Shortness of breath. Syncope.    Is trying to walk rapidly more recently.  No sx. Wants to get back to more exercise.   Past Medical History:  Diagnosis Date   Cancer [facility])    Coronary atherosclerosis of native coronary artery    a. s/p MI with DES/PCI to the RCA in 2013.   Encounter for antineoplastic chemotherapy 06/20/2015   Essential hypertension    GERD (gastroesophageal reflux disease)    History of heart attack    Hyperlipidemia    Ischemic cardiomyopathy  Myocardial infarction (HCC)    Non Hodgkin's lymphoma (HCC)    Plavix resistance    Plavix hyporesponder 2013   Sinus bradycardia    baseline HR 50s at times    Past Surgical History:  Procedure Laterality Date   CORONARY STENT PLACEMENT     LEFT HEART CATHETERIZATION WITH CORONARY ANGIOGRAM N/A 03/03/2012   Procedure: LEFT HEART CATHETERIZATION WITH CORONARY ANGIOGRAM;  Surgeon: Donato Schultz, MD;  Location: [facility] CATH LAB;  Service: Cardiovascular;  Laterality: N/A;    PERCUTANEOUS CORONARY STENT INTERVENTION (PCI-S) N/A 03/04/2012   Procedure: PERCUTANEOUS CORONARY STENT INTERVENTION (PCI-S);  Surgeon: Lesleigh Noe, MD;  Location: [facility] CATH LAB;  Service: Cardiovascular;  Laterality: N/A;     Current Outpatient Medications  Medication Sig Dispense Refill   aspirin EC 81 MG tablet Take 81 mg by mouth at bedtime.     atorvastatin (LIPITOR) 80 MG tablet TAKE 1 TABLET(80 MG) BY MOUTH DAILY (Patient taking differently: 40 mg.) 90 tablet 3   diphenhydrAMINE (BENADRYL) 25 MG tablet Take 25 mg by mouth at bedtime as needed for sleep.      ferrous sulfate (FERROUSUL) 325 (65 FE) MG tablet Take 1 tablet (325 mg total) by mouth 2 (two) times daily with a meal. 180 tablet 3   omeprazole (PRILOSEC) 20 MG capsule Take 20 mg by mouth daily as needed (acid relfux).     Polyethylene Glycol 3350 (PEG 3350) POWD Take by mouth.     sildenafil (VIAGRA) 100 MG tablet Take 100 mg by mouth daily as needed.  3   losartan (COZAAR) 100 MG tablet Take 1 tablet (100 mg total) by mouth daily. (Patient not taking: Reported on 07/19/2022) 90 tablet 3   No current facility-administered medications for this visit.    Allergies:   Neosporin [neomycin-bacitracin zn-polymyx]    Social History:  The patient  reports that he has never smoked. He has never used smokeless tobacco. He reports current alcohol use. He reports that he does not use drugs.   Family History:  The patient's family history includes Leukemia in his mother.    ROS:  Please see the history of present illness.   Otherwise, review of systems are positive for improving stamina.   All other systems are reviewed and negative.    PHYSICAL EXAM: VS:  BP 128/74   Pulse 73   Ht 5\' 7"  (1.702 m)   Wt 165 lb 9.6 oz (75.1 kg)   SpO2 94%   BMI 25.94 kg/m  , BMI Body mass index is 25.94 kg/m. GEN: Well nourished, well developed, in no acute distress HEENT: normal Neck: no JVD, carotid bruits, or masses Cardiac: RRR;  no murmurs, rubs, or gallops,no edema  Respiratory:  clear to auscultation bilaterally, normal work of breathing GI: soft, nontender, nondistended, + BS MS: no deformity or atrophy Skin: warm and dry, no rash Neuro:  Strength and sensation are intact Psych: euthymic mood, full affect   EKG:   The ekg ordered Feb 2024 demonstrates normal sinus rhythm, no ST segment changes   Recent Labs: 07/03/2022: ALT 14; BUN 27; Creatinine 1.50; Hemoglobin 10.8; Platelet Count 275; Potassium 4.4; Sodium 140   Lipid Panel    Component Value Date/Time   CHOL 122 10/31/2021 1124   CHOL 140 11/23/2020 0812   TRIG 61.0 10/31/2021 1124   HDL 40.60 10/31/2021 1124   HDL 49 11/23/2020 0812   CHOLHDL 3 10/31/2021 1124   VLDL 12.2 10/31/2021 1124   LDLCALC 69  10/31/2021 1124   LDLCALC 76 11/23/2020 0812     Other studies Reviewed: Additional studies/ records that were reviewed today with results demonstrating: Hemoglobin 10.8 at most recent check..   ASSESSMENT AND PLAN:  CAD/old MI: HR improved after rest, down to 68.  We discussed his PET/CT stress test findings.  He is not having any angina.  In fact, he feels that he is doing better as time has passed since his prostate surgery.  Prior dyspnea is improving.  He is still anemic and is taking iron pills.  He is hoping to get back to more exercise.  We discussed cardiac catheterization based on the findings.  For now, we will hold off.  If he begins to have exertional symptoms, he will let us know and we would have a low threshold for catheterization.   HTN: The current medical regimen is effective;  continue present plan and medications. Hyperlipidemia: stop atorvastatin due to leg cramps- not sure that they are related but he wants to try a different medicine.  Start rosuvastatin 40 mg daily. ZOX09.  Recheck lipids and liver tests in about 3 months PreDM: Whole food, plant-based diet.  High-fiber diet.  Avoid processed foods. CKD: Avoid  nephrotoxins.  Stay well-hydrated.  Losartan was stopped.  Blood pressure still well-controlled today.  Can consider adding back ARB based on renal function in the future.  There is a cardioprotective effect.  Will not add back today since his creatinine is mildly increased at last check.   Current medicines are reviewed at length with the patient today.  The patient concerns regarding his medicines were addressed.  The following changes have been made:  No change  Labs/ tests ordered today include:  No orders of the defined types were placed in this encounter.   Recommend 150 minutes/week of aerobic exercise Low fat, low carb, high fiber diet recommended  Disposition:   FU in 6 months   Signed, Lance Muss, MD  07/19/2022 9:14 AM    [facility] [address] [place], [place], [place]  [address] Phone: [phone]; Fax: [phone]

## 2022-09-15 DIAGNOSIS — T63441A Toxic effect of venom of bees, accidental (unintentional), initial encounter: Secondary | ICD-10-CM | POA: Diagnosis not present

## 2022-09-15 DIAGNOSIS — R220 Localized swelling, mass and lump, head: Secondary | ICD-10-CM | POA: Diagnosis not present

## 2022-09-18 ENCOUNTER — Ambulatory Visit (INDEPENDENT_AMBULATORY_CARE_PROVIDER_SITE_OTHER): Payer: Medicare Other

## 2022-09-18 VITALS — Ht 67.0 in | Wt 165.0 lb

## 2022-09-18 DIAGNOSIS — Z0000 Encounter for general adult medical examination without abnormal findings: Secondary | ICD-10-CM
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
## 2022-09-18 NOTE — Patient Instructions (Signed)
Eric [name] , Thank you for taking time to come for your Medicare Wellness Visit. I appreciate your ongoing commitment to your health goals. Please review the following plan we discussed and let me know if I can assist you in the future.   These are the goals we discussed:  Goals      Weight (lb) < 200 lb (90.7 kg)        This is a list of the screening recommended for you and due dates:  Health Maintenance  Topic Date Due   Zoster (Shingles) Vaccine (1 of 2) Never done   COVID-19 Vaccine (3 - Moderna risk series) 07/31/2019   Flu Shot  10/04/2022   Medicare Annual Wellness Visit  09/18/2023   DTaP/Tdap/Td vaccine (3 - Td or Tdap) 10/13/2031   Pneumonia Vaccine  Completed   HPV Vaccine  Aged Out    Health Maintenance After Age 82 After age 63, you are at a higher risk for certain long-term diseases and infections as well as injuries from falls. Falls are a major cause of broken bones and head injuries in people who are older than age 82. Getting regular preventive care can help to keep you healthy and well. Preventive care includes getting regular testing and making lifestyle changes as recommended by your health care provider. Talk with your health care provider about: Which screenings and tests you should have. A screening is a test that checks for a disease when you have no symptoms. A diet and exercise plan that is right for you. What should I know about screenings and tests to prevent falls? Screening and testing are the best ways to find a health problem early. Early diagnosis and treatment give you the best chance of managing medical conditions that are common after age 34. Certain conditions and lifestyle choices may make you more likely to have a fall. Your health care provider may recommend: Regular vision checks. Poor vision and conditions such as cataracts can make you more likely to have a fall. If you wear glasses, make sure to get your prescription updated if your vision  changes. Medicine review. Work with your health care provider to regularly review all of the medicines you are taking, including over-the-counter medicines. Ask your health care provider about any side effects that may make you more likely to have a fall. Tell your health care provider if any medicines that you take make you feel dizzy or sleepy. Strength and balance checks. Your health care provider may recommend certain tests to check your strength and balance while standing, walking, or changing positions. Foot health exam. Foot pain and numbness, as well as not wearing proper footwear, can make you more likely to have a fall. Screenings, including: Osteoporosis screening. Osteoporosis is a condition that causes the bones to get weaker and break more easily. Blood pressure screening. Blood pressure changes and medicines to control blood pressure can make you feel dizzy. Depression screening. You may be more likely to have a fall if you have a fear of falling, feel depressed, or feel unable to do activities that you used to do. Alcohol use screening. Using too much alcohol can affect your balance and may make you more likely to have a fall. Follow these instructions at home: Lifestyle Do not drink alcohol if: Your health care provider tells you not to drink. If you drink alcohol: Limit how much you have to: 0-1 drink a day for women. 0-2 drinks a day for men. Know how much  alcohol is in your drink. In the [place] one drink equals one 12 oz bottle of beer (355 mL), one 5 oz glass of wine (148 mL), or one 1 oz glass of hard liquor (44 mL). Do not use any products that contain nicotine or tobacco. These products include cigarettes, chewing tobacco, and vaping devices, such as e-cigarettes. If you need help quitting, ask your health care provider. Activity  Follow a regular exercise program to stay fit. This will help you maintain your balance. Ask your health care provider what types of exercise  are appropriate for you. If you need a cane or walker, use it as recommended by your health care provider. Wear supportive shoes that have nonskid soles. Safety  Remove any tripping hazards, such as rugs, cords, and clutter. Install safety equipment such as grab bars in bathrooms and safety rails on stairs. Keep rooms and walkways well-lit. General instructions Talk with your health care provider about your risks for falling. Tell your health care provider if: You fall. Be sure to tell your health care provider about all falls, even ones that seem minor. You feel dizzy, tiredness (fatigue), or off-balance. Take over-the-counter and prescription medicines only as told by your health care provider. These include supplements. Eat a healthy diet and maintain a healthy weight. A healthy diet includes low-fat dairy products, low-fat (lean) meats, and fiber from whole grains, beans, and lots of fruits and vegetables. Stay current with your vaccines. Schedule regular health, dental, and eye exams. Summary Having a healthy lifestyle and getting preventive care can help to protect your health and wellness after age 21. Screening and testing are the best way to find a health problem early and help you avoid having a fall. Early diagnosis and treatment give you the best chance for managing medical conditions that are more common for people who are older than age 38. Falls are a major cause of broken bones and head injuries in people who are older than age 32. Take precautions to prevent a fall at home. Work with your health care provider to learn what changes you can make to improve your health and wellness and to prevent falls. This information is not intended to replace advice given to you by your health care provider. Make sure you discuss any questions you have with your health care provider. Document Revised: 07/11/2020 Document Reviewed: 07/11/2020 Elsevier Patient Education  2024 Elsevier  Inc.  Per patient no change in vitals since last visit, unable to obtain new vitals due to telehealth visit

## 2022-10-18 ENCOUNTER — Ambulatory Visit: Payer: Medicare Other | Attending: Interventional Cardiology

## 2022-10-18 DIAGNOSIS — E782 Mixed hyperlipidemia: Secondary | ICD-10-CM

## 2022-10-18 DIAGNOSIS — I10 Essential (primary) hypertension: Secondary | ICD-10-CM | POA: Diagnosis not present

## 2022-10-18 DIAGNOSIS — R0609 Other forms of dyspnea: Secondary | ICD-10-CM

## 2022-10-19 ENCOUNTER — Ambulatory Visit: Payer: Medicare Other

## 2022-10-19 LAB — CBC
Hematocrit: 44 % (ref 37.5–51.0)
Hemoglobin: 14.3 g/dL (ref 13.0–17.7)
MCH: 27.2 pg (ref 26.6–33.0)
MCHC: 32.5 g/dL (ref 31.5–35.7)
MCV: 84 fL (ref 79–97)
Platelets: 230 10*3/uL (ref 150–450)
RBC: 5.25 x10E6/uL (ref 4.14–5.80)
RDW: 17.3 % — ABNORMAL HIGH (ref 11.6–15.4)
WBC: 6.2 10*3/uL (ref 3.4–10.8)

## 2022-10-19 LAB — HEPATIC FUNCTION PANEL
ALT: 15 IU/L (ref 0–44)
AST: 21 IU/L (ref 0–40)
Albumin: 4.3 g/dL (ref 3.7–4.7)
Alkaline Phosphatase: 141 IU/L — ABNORMAL HIGH (ref 44–121)
Bilirubin Total: 0.2 mg/dL (ref 0.0–1.2)
Bilirubin, Direct: <0.1 mg/dL (ref 0.00–0.40)
Total Protein: 6.3 g/dL (ref 6.0–8.5)

## 2022-10-19 LAB — LIPID PANEL
Chol/HDL Ratio: 4.1 ratio (ref 0.0–5.0)
Cholesterol, Total: 165 mg/dL (ref 100–199)
HDL: 40 mg/dL (ref >39)
LDL Chol Calc (NIH): 89 mg/dL (ref 0–99)
Triglycerides: 211 mg/dL — ABNORMAL HIGH (ref 0–149)
VLDL Cholesterol Cal: 36 mg/dL (ref 5–40)

## 2022-12-06 ENCOUNTER — Other Ambulatory Visit: Payer: Self-pay

## 2022-12-06 ENCOUNTER — Emergency Department (HOSPITAL_COMMUNITY)
Admission: EM | Admit: 2022-12-06 | Discharge: 2022-12-06 | Disposition: A | Discharge status: Home / Self Care | Payer: Medicare Other | Attending: Emergency Medicine | Admitting: Emergency Medicine

## 2022-12-06 DIAGNOSIS — S60562A Insect bite (nonvenomous) of left hand, initial encounter: Secondary | ICD-10-CM | POA: Diagnosis not present

## 2022-12-06 DIAGNOSIS — W57XXXA Bitten or stung by nonvenomous insect and other nonvenomous arthropods, initial encounter: Secondary | ICD-10-CM | POA: Diagnosis not present

## 2022-12-06 DIAGNOSIS — S00262A Insect bite (nonvenomous) of left eyelid and periocular area, initial encounter: Secondary | ICD-10-CM | POA: Insufficient documentation

## 2022-12-06 MED ORDER — PREDNISONE 20 MG PO TABS
60.0000 mg | ORAL_TABLET | Freq: Once | ORAL | Status: AC
  Administered 2022-12-06: 60 mg via ORAL
  Filled 2022-12-06: qty 3

## 2022-12-06 MED ORDER — PREDNISONE 20 MG PO TABS: 40.0000 mg | ORAL_TABLET | Freq: Every day | ORAL | 0 refills | Status: DC

## 2022-12-06 NOTE — Discharge Instructions (Addendum)
Please continue taking the benadryl for the next couple of days.  Keep your hand elevated as much as possible to help with the swelling.    Take medications as prescribed.  Return for new or worsening symptoms.

## 2022-12-06 NOTE — ED Triage Notes (Signed)
Pt arrives to ED c/o yellow jackets stings to left eyelid and left hand around 5pm yesterday. Pt denies SOB, notable swelling noted to affected areas

## 2022-12-06 NOTE — ED Provider Notes (Signed)
[facility]EMERGENCY DEPT [facility] Emergency Department Provider Note MRN:  [id number]  Arrival date & time: 12/06/22     Chief Complaint   Insect Bite   History of Present Illness   Eric [name] is a 82 y.o. year-old male presents to the ED with chief complaint of yellow jacket stings.  States that he got stung on his hand and eyelid yesterday.  States that he took benadryl without much improvement.  Denies SOB.  States that he is most concerned about the swelling of his hand because he has not reacted like this before.  History provided by patient.   Review of Systems  Pertinent positive and negative review of systems noted in HPI.    Physical Exam   Vitals:   12/06/22 0351  BP: (!) 146/82  Pulse: 76  Resp: 18  Temp: 97.7 F (36.5 C)  SpO2: 100%    CONSTITUTIONAL:  well-appearing, NAD NEURO:  Alert and oriented x 3, CN 3-12 grossly intact EYES:  eyes equal and reactive, mild swelling of left eyelid ENT/NECK:  Supple, no stridor  CARDIO:  normal rate, appears well-perfused, intact distal pulses PULM:  No respiratory distress,  GI/GU:  non-distended,  MSK/SPINE:  No gross deformities, no edema, moves all extremities, moderate swelling of left hand SKIN:  no rash, atraumatic   *Additional and/or pertinent findings included in MDM below  Diagnostic and Interventional Summary    EKG Interpretation Date/Time:    Ventricular Rate:    PR Interval:    QRS Duration:    QT Interval:    QTC Calculation:   R Axis:      Text Interpretation:         Labs Reviewed - No data to display  No orders to display    Medications  predniSONE (DELTASONE) tablet 60 mg (60 mg Oral Given 12/06/22 0605)     Procedures  /  Critical Care Procedures  ED Course and Medical Decision Making  I have reviewed the triage vital signs, the nursing notes, and pertinent available records from the EMR.  Social Determinants Affecting Complexity of Care: Patient has no clinically  significant social determinants affecting this chief complaint..   ED Course:    Medical Decision Making Patient here with insect stings to left hand and left eyelid.  He has mild swelling of the left eyelid, but does have moderate swelling to the left hand.  No systemic signs or signs of anaphylaxis.  He's taken benadryl.  Will add prednisone due to the swelling of the hand.  He appears stable for outpatient follow-up.  Risk Prescription drug management.         Consultants: No consultations were needed in caring for this patient.   Treatment and Plan: Emergency department workup does not suggest an emergent condition requiring admission or immediate intervention beyond  what has been performed at this time. The patient is safe for discharge and has  been instructed to return immediately for worsening symptoms, change in  symptoms or any other concerns    Final Clinical Impressions(s) / ED Diagnoses     ICD-10-CM   1. Insect bite of left hand, initial encounter  P29.518A    W57.XXXA     2. Insect bite of left eyelid, initial encounter  S00.262A    [email or web address] Skeens       ED Discharge Orders          Ordered    predniSONE (DELTASONE) 20 MG tablet  Daily  12/06/22 1610              Discharge Instructions Discussed with and Provided to Patient:     Discharge Instructions      Please continue taking the benadryl for the next couple of days.  Keep your hand elevated as much as possible to help with the swelling.    Take medications as prescribed.  Return for new or worsening symptoms.       Roxy Horseman, PA-C 12/06/22 0617    Sloan Leiter, DO 12/06/22 [phone]

## 2022-12-31 ENCOUNTER — Inpatient Hospital Stay: Payer: Medicare Other

## 2023-01-01 ENCOUNTER — Inpatient Hospital Stay: Payer: Medicare Other | Attending: Internal Medicine

## 2023-01-01 DIAGNOSIS — Z79899 Other long term (current) drug therapy: Secondary | ICD-10-CM | POA: Diagnosis not present

## 2023-01-01 DIAGNOSIS — C8208 Follicular lymphoma grade I, lymph nodes of multiple sites: Secondary | ICD-10-CM | POA: Insufficient documentation

## 2023-01-01 LAB — CBC WITH DIFFERENTIAL (CANCER CENTER ONLY)
Abs Immature Granulocytes: 0.03 10*3/uL (ref 0.00–0.07)
Basophils Absolute: 0 10*3/uL (ref 0.0–0.1)
Basophils Relative: 0 %
Eosinophils Absolute: 0.1 10*3/uL (ref 0.0–0.5)
Eosinophils Relative: 1 %
HCT: 39.1 % (ref 39.0–52.0)
Hemoglobin: 13 g/dL (ref 13.0–17.0)
Immature Granulocytes: 0 %
Lymphocytes Relative: 23 %
Lymphs Abs: 2.1 10*3/uL (ref 0.7–4.0)
MCH: 30.2 pg (ref 26.0–34.0)
MCHC: 33.2 g/dL (ref 30.0–36.0)
MCV: 90.7 fL (ref 80.0–100.0)
Monocytes Absolute: 1.1 10*3/uL — ABNORMAL HIGH (ref 0.1–1.0)
Monocytes Relative: 12 %
Neutro Abs: 6 10*3/uL (ref 1.7–7.7)
Neutrophils Relative %: 64 %
Platelet Count: 338 10*3/uL (ref 150–400)
RBC: 4.31 MIL/uL (ref 4.22–5.81)
RDW: 14.3 % (ref 11.5–15.5)
WBC Count: 9.3 10*3/uL (ref 4.0–10.5)
nRBC: 0 % (ref 0.0–0.2)

## 2023-01-01 LAB — CMP (CANCER CENTER ONLY)
ALT: 23 U/L (ref 0–44)
AST: 25 U/L (ref 15–41)
Albumin: 3.9 g/dL (ref 3.5–5.0)
Alkaline Phosphatase: 106 U/L (ref 38–126)
Anion gap: 7 (ref 5–15)
BUN: 29 mg/dL — ABNORMAL HIGH (ref 8–23)
CO2: 26 mmol/L (ref 22–32)
Calcium: 9.7 mg/dL (ref 8.9–10.3)
Chloride: 106 mmol/L (ref 98–111)
Creatinine: 1.46 mg/dL — ABNORMAL HIGH (ref 0.61–1.24)
GFR, Estimated: 48 mL/min — ABNORMAL LOW (ref >60)
Glucose, Bld: 80 mg/dL (ref 70–99)
Potassium: 4.9 mmol/L (ref 3.5–5.1)
Sodium: 139 mmol/L (ref 135–145)
Total Bilirubin: 0.3 mg/dL (ref 0.3–1.2)
Total Protein: 6.6 g/dL (ref 6.5–8.1)

## 2023-01-01 LAB — LACTATE DEHYDROGENASE: LDH: 166 U/L (ref 98–192)

## 2023-01-02 ENCOUNTER — Ambulatory Visit: Payer: Medicare Other | Admitting: Internal Medicine

## 2023-01-08 ENCOUNTER — Ambulatory Visit (HOSPITAL_COMMUNITY)
Admission: RE | Admit: 2023-01-08 | Discharge: 2023-01-08 | Disposition: A | Discharge status: Home / Self Care | Payer: Medicare Other | Source: Ambulatory Visit | Attending: Internal Medicine | Admitting: Internal Medicine

## 2023-01-08 ENCOUNTER — Other Ambulatory Visit: Payer: Self-pay | Admitting: Internal Medicine

## 2023-01-08 DIAGNOSIS — C8290 Follicular lymphoma, unspecified, unspecified site: Secondary | ICD-10-CM | POA: Diagnosis not present

## 2023-01-08 DIAGNOSIS — C8208 Follicular lymphoma grade I, lymph nodes of multiple sites: Secondary | ICD-10-CM

## 2023-01-08 DIAGNOSIS — K449 Diaphragmatic hernia without obstruction or gangrene: Secondary | ICD-10-CM | POA: Diagnosis not present

## 2023-01-08 DIAGNOSIS — I700 Atherosclerosis of aorta: Secondary | ICD-10-CM | POA: Diagnosis not present

## 2023-01-08 MED ORDER — IOHEXOL 300 MG/ML  SOLN
100.0000 mL | Freq: Once | INTRAMUSCULAR | Status: AC | PRN
  Administered 2023-01-08: 100 mL via INTRAVENOUS

## 2023-01-14 NOTE — Progress Notes (Signed)
[facility] OFFICE PROGRESS NOTE  Eric Quint, MD [address] [place] [address]  DIAGNOSIS: Bulky stage IV low-grade lymphoma diagnosed in November 2009.   PRIOR THERAPY: Status post 7 cycles of systemic chemotherapy with CHOP/Rituxan. Last dose was given May 2010. Status post 6 cycles of maintenance Rituxan 375 mg/sq m given every 2 months. Last dose was given on May 29, 2010, and the patient was lost to followup at that time. He received another dose on 12/11/2010, then again missed 2 doses. systemic chemotherapy with Rituxan 375 mg/M2 on day 1 and that bendamustine 90 mg/M2 on days 1 and 2 every 4 weeks, status post 6 cycles. Maintenance Rituxan 375 mg/M2 every 2 months, status post 12 cycles  Rituxan 375 MG/M2 given weekly for 4 weeks and this is followed by maintenance treatment every 2 months, status post 22 cycles discontinued secondary to disease progression. Systemic chemotherapy with bendamustine 90 mg/M2 on days 1 and 2 and Rituxan 375 mg/M2 on day 1 every 4 weeks.  First dose March 03, 2018.  Status post 4 cycles.  CURRENT THERAPY: Observation.   INTERVAL HISTORY: Eric [name] 82 y.o. male returns to the clinic today for a follow up visit. He was last seen by Dr. Arbutus Ped in April 2024. He is followed for his history of lymphoma for which he is on observation.  He denies any major change in his health since last being seen. Denies any fever, chills, night sweats, or weight loss. Denies adenopathy. Denies abnormal bleeding or bruising. Denies any recent infections. Denies abdominal pain, bloating, or early satiety.  Denies any chest pain, shortness of breath, cough, or hemoptysis. Denies any nausea, vomiting, diarrhea, or constipation. Denies any headache or visual changes. Denies any rashes or skin changes. He recently had a restaging CT scan performed. The patient is here today for evaluation and to review his scan results.    MEDICAL  HISTORY: Past Medical History:  Diagnosis Date   Cancer [facility])    Coronary atherosclerosis of native coronary artery    a. s/p MI with DES/PCI to the RCA in 2013.   Encounter for antineoplastic chemotherapy 06/20/2015   Essential hypertension    GERD (gastroesophageal reflux disease)    History of heart attack    Hyperlipidemia    Ischemic cardiomyopathy    Myocardial infarction (HCC)    Non Hodgkin's lymphoma (HCC)    Plavix resistance    Plavix hyporesponder 2013   Sinus bradycardia    baseline HR 50s at times    ALLERGIES:  is allergic to neosporin [neomycin-bacitracin zn-polymyx].  MEDICATIONS:  Current Outpatient Medications  Medication Sig Dispense Refill   aspirin EC 81 MG tablet Take 81 mg by mouth at bedtime.     diphenhydrAMINE (BENADRYL) 25 MG tablet Take 25 mg by mouth at bedtime as needed for sleep.      ferrous sulfate (FERROUSUL) 325 (65 FE) MG tablet Take 1 tablet (325 mg total) by mouth 2 (two) times daily with a meal. 180 tablet 3   losartan (COZAAR) 100 MG tablet Take 1 tablet (100 mg total) by mouth daily. 90 tablet 3   omeprazole (PRILOSEC) 20 MG capsule Take 20 mg by mouth daily as needed (acid relfux).     Polyethylene Glycol 3350 (PEG 3350) POWD Take by mouth.     predniSONE (DELTASONE) 20 MG tablet Take 2 tablets (40 mg total) by mouth daily. 10 tablet 0   rosuvastatin (CRESTOR) 40 MG tablet Take 1  tablet (40 mg total) by mouth daily. 90 tablet 3   sildenafil (VIAGRA) 100 MG tablet Take 100 mg by mouth daily as needed.  3   No current facility-administered medications for this visit.    SURGICAL HISTORY:  Past Surgical History:  Procedure Laterality Date   CORONARY STENT PLACEMENT     LEFT HEART CATHETERIZATION WITH CORONARY ANGIOGRAM N/A 03/03/2012   Procedure: LEFT HEART CATHETERIZATION WITH CORONARY ANGIOGRAM;  Surgeon: Donato Schultz, MD;  Location: [facility] CATH LAB;  Service: Cardiovascular;  Laterality: N/A;   PERCUTANEOUS CORONARY STENT INTERVENTION  (PCI-S) N/A 03/04/2012   Procedure: PERCUTANEOUS CORONARY STENT INTERVENTION (PCI-S);  Surgeon: Lesleigh Noe, MD;  Location: [facility] CATH LAB;  Service: Cardiovascular;  Laterality: N/A;    REVIEW OF SYSTEMS:   Review of Systems  Constitutional: Negative for appetite change, chills, fatigue, fever and unexpected weight change.  HENT:   Negative for mouth sores, nosebleeds, sore throat and trouble swallowing.   Eyes: Negative for eye problems and icterus.  Respiratory: Negative for cough, hemoptysis, shortness of breath and wheezing.   Cardiovascular: Negative for chest pain and leg swelling.  Gastrointestinal: Negative for abdominal pain, constipation, diarrhea, nausea and vomiting.  Genitourinary: Negative for bladder incontinence, difficulty urinating, dysuria, frequency and hematuria.   Musculoskeletal: Negative for back pain, gait problem, neck pain and neck stiffness.  Skin: Negative for itching and rash.  Neurological: Negative for dizziness, extremity weakness, gait problem, headaches, light-headedness and seizures.  Hematological: Negative for adenopathy. Does not bruise/bleed easily.  Psychiatric/Behavioral: Negative for confusion, depression and sleep disturbance. The patient is not nervous/anxious.     PHYSICAL EXAMINATION:  Blood pressure 130/69, pulse 60, temperature (!) 97.2 F (36.2 C), temperature source Temporal, resp. rate 16, height 5\' 7"  (1.702 m), weight 166 lb 3.2 oz (75.4 kg), SpO2 99%.  ECOG PERFORMANCE STATUS: 0  Physical Exam  Constitutional: Oriented to person, place, and time and well-developed, well-nourished, and in no distress.  HENT:  Head: Normocephalic and atraumatic.  Mouth/Throat: Oropharynx is clear and moist. No oropharyngeal exudate.  Eyes: Conjunctivae are normal. Right eye exhibits no discharge. Left eye exhibits no discharge. No scleral icterus.  Neck: Normal range of motion. Neck supple.  Cardiovascular: Normal rate, regular rhythm, normal  heart sounds and intact distal pulses.   Pulmonary/Chest: Effort normal and breath sounds normal. No respiratory distress. No wheezes. No rales.  Abdominal: Soft. Bowel sounds are normal. Exhibits no distension and no mass. There is no tenderness.  Musculoskeletal: Normal range of motion. Exhibits no edema.  Lymphadenopathy:    No cervical adenopathy.  Neurological: Alert and oriented to person, place, and time. Exhibits normal muscle tone. Gait normal. Coordination normal.  Skin: Skin is warm and dry. No rash noted. Not diaphoretic. No erythema. No pallor.  Psychiatric: Mood, memory and judgment normal.  Vitals reviewed.  LABORATORY DATA: Lab Results  Component Value Date   WBC 9.3 01/01/2023   HGB 13.0 01/01/2023   HCT 39.1 01/01/2023   MCV 90.7 01/01/2023   PLT 338 01/01/2023      Chemistry      Component Value Date/Time   NA 139 01/01/2023 1222   NA 141 08/19/2020 0957   NA 140 02/14/2017 0804   K 4.9 01/01/2023 1222   K 4.4 02/14/2017 0804   CL 106 01/01/2023 1222   CL 106 08/06/2012 1244   CO2 26 01/01/2023 1222   CO2 22 02/14/2017 0804   BUN 29 (H) 01/01/2023 1222   BUN 20  08/19/2020 0957   BUN 14.8 02/14/2017 0804   CREATININE 1.46 (H) 01/01/2023 1222   CREATININE 1.1 02/14/2017 0804      Component Value Date/Time   CALCIUM 9.7 01/01/2023 1222   CALCIUM 9.2 02/14/2017 0804   ALKPHOS 106 01/01/2023 1222   ALKPHOS 138 02/14/2017 0804   AST 25 01/01/2023 1222   AST 29 02/14/2017 0804   ALT 23 01/01/2023 1222   ALT 27 02/14/2017 0804   BILITOT 0.3 01/01/2023 1222   BILITOT 0.61 02/14/2017 0804       RADIOGRAPHIC STUDIES:  CT CHEST ABDOMEN PELVIS W CONTRAST  Result Date: 01/11/2023 CLINICAL DATA:  Staging of follicular lymphoma. Non-Hodgkin's lymphoma diagnosed in right axilla and neck in 2009. Prostate surgery earlier this year. Chemotherapy complete. No complaints. * Tracking Code: BO * EXAM: CT CHEST, ABDOMEN, AND PELVIS WITH CONTRAST TECHNIQUE:  Multidetector CT imaging of the chest, abdomen and pelvis was performed following the standard protocol during bolus administration of intravenous contrast. RADIATION DOSE REDUCTION: This exam was performed according to the departmental dose-optimization program which includes automated exposure control, adjustment of the mA and/or kV according to patient size and/or use of iterative reconstruction technique. CONTRAST:  OMNIPAQUE IOHEXOL 300 MG/ML  SOLN COMPARISON:  12/29/2021 FINDINGS: CT CHEST FINDINGS Cardiovascular: Aortic atherosclerosis. Normal heart size, without pericardial effusion. Three vessel coronary artery calcification. No central pulmonary embolism, on this non-dedicated study. Mediastinum/Nodes: No supraclavicular adenopathy. Surgical clips in the right axilla. No axillary adenopathy. No subpectoral adenopathy. No mediastinal or hilar adenopathy. A moderate hiatal hernia. Lungs/Pleura: No pleural fluid. Mild diffuse, left-greater-than-right bronchiectasis may be postinfectious or inflammatory. Musculoskeletal: No acute osseous abnormality. CT ABDOMEN PELVIS FINDINGS Hepatobiliary: Normal liver. Normal gallbladder, without biliary ductal dilatation. Pancreas: Normal, without mass or ductal dilatation. Spleen: Normal in size, without focal abnormality. Adrenals/Urinary Tract: Normal adrenal glands. Right larger than left renal cysts of up to 5.1 cm. Too small to characterize upper pole left renal lesion is also most likely a cyst . In the absence of clinically indicated signs/symptoms require(s) no independent follow-up. No hydronephrosis. Normal urinary bladder. Stomach/Bowel: Normal remainder of the stomach. Extensive colonic diverticulosis. Normal terminal ileum and appendix. Normal small bowel. Vascular/Lymphatic: Advanced aortic and branch vessel atherosclerosis. Small mesenteric nodes are similar and likely reactive. No abdominopelvic adenopathy. Reproductive: Moderate prostatomegaly with  TURP defect within. Prostate is decreased in size since the prior. Other: No significant free fluid. No evidence of omental or peritoneal disease. Small fat containing ventral abdominal wall hernias. Musculoskeletal: No acute osseous abnormality. S shaped thoracolumbar spine curvature. IMPRESSION: 1. No evidence of residual or recurrent lymphoma within the chest abdomen or pelvis. 2. Incidental findings, including: Moderate hiatal hernia. Coronary artery atherosclerosis. Aortic Atherosclerosis (ICD10-I70.0). Improved prostatomegaly with interval TURP defect. Electronically Signed   By: Jeronimo Greaves M.D.   On: 01/11/2023 12:48     ASSESSMENT/PLAN:  This is a very pleasant 82 years old African-American male with bulky stage IV follicular lymphoma initially treated with CHOP/Rituxan followed by maintenance Rituxan. The patient had disease recurrence and he was restarted again on treatment with Rituxan every 2 months status post 22 cycles.   The patient was found to have progression in December 2019. He was started on systemic chemotherapy with bendamustine and Rituxan status post 4 cycles.   He has been on observation since 2020.   The patient was seen with Dr. Arbutus Ped today.  Dr. Arbutus Ped personally and independently reviewed the scan and discussed results with the patient today.  The scan showed no evidence of disease progression.    Dr. Arbutus Ped recommends he continue on observation with repeat labs in 6 months.   The patient was advised to call immediately if he has any concerning symptoms in the interval. The patient voices understanding of current disease status and treatment options and is in agreement with the current care plan. All questions were answered. The patient knows to call the clinic with any problems, questions or concerns. We can certainly see the patient much sooner if necessary   Orders Placed This Encounter  Procedures   CBC with Differential (Cancer Center Only)    Standing  Status:   Future    Standing Expiration Date:   01/17/2024   CMP (Cancer Center only)    Standing Status:   Future    Standing Expiration Date:   01/17/2024   Lactate dehydrogenase (LDH)    Standing Status:   Future    Standing Expiration Date:   01/17/2024      Johnette Abraham Juluis Fitzsimmons, PA-C 01/17/23  ADDENDUM: Hematology/Oncology Attending: I had a face-to-face encounter with the patient.  I reviewed his record, lab, scan and recommended his care plan.  This is a very pleasant 82 years old African-American male with history of bulky stage IV low-grade lymphoma diagnosed in November 2009 status post several treatment in the past including CHOP Rituxan as well as maintenance treatment with Rituxan and lately Bendamustine and Rituxan completed in April 2020.  The patient has been on observation since that time.  He is feeling fine with no concerning complaints.  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  He had repeat blood work as well as CT scan of the chest, abdomen and pelvis performed recently.  I personally and independently reviewed the scan and discussed the result with the patient today. His scan showed no concerning findings for disease progression. I recommended for him to continue on observation with repeat blood work in 6 months. He was advised to call immediately if he has any other concerning symptoms in the interval. The total time spent in the appointment was 25 minutes. Disclaimer: This note was dictated with voice recognition software. Similar sounding words can inadvertently be transcribed and may be missed upon review. Lajuana Matte, MD

## 2023-01-17 ENCOUNTER — Ambulatory Visit: Payer: Medicare Other | Admitting: Physician Assistant

## 2023-01-17 ENCOUNTER — Inpatient Hospital Stay: Payer: Medicare Other | Attending: Internal Medicine | Admitting: Physician Assistant

## 2023-01-17 VITALS — BP 130/69 | HR 60 | Temp 97.2°F | Resp 16 | Ht 67.0 in | Wt 166.2 lb

## 2023-01-17 DIAGNOSIS — I10 Essential (primary) hypertension: Secondary | ICD-10-CM | POA: Diagnosis not present

## 2023-01-17 DIAGNOSIS — K449 Diaphragmatic hernia without obstruction or gangrene: Secondary | ICD-10-CM | POA: Diagnosis not present

## 2023-01-17 DIAGNOSIS — I252 Old myocardial infarction: Secondary | ICD-10-CM | POA: Diagnosis not present

## 2023-01-17 DIAGNOSIS — C8208 Follicular lymphoma grade I, lymph nodes of multiple sites: Secondary | ICD-10-CM | POA: Diagnosis not present

## 2023-01-17 DIAGNOSIS — I700 Atherosclerosis of aorta: Secondary | ICD-10-CM | POA: Insufficient documentation

## 2023-01-17 DIAGNOSIS — Z79899 Other long term (current) drug therapy: Secondary | ICD-10-CM | POA: Insufficient documentation

## 2023-01-17 DIAGNOSIS — N281 Cyst of kidney, acquired: Secondary | ICD-10-CM | POA: Diagnosis not present

## 2023-01-17 DIAGNOSIS — E785 Hyperlipidemia, unspecified: Secondary | ICD-10-CM | POA: Insufficient documentation

## 2023-01-17 DIAGNOSIS — K219 Gastro-esophageal reflux disease without esophagitis: Secondary | ICD-10-CM | POA: Insufficient documentation

## 2023-01-17 DIAGNOSIS — K5730 Diverticulosis of large intestine without perforation or abscess without bleeding: Secondary | ICD-10-CM | POA: Insufficient documentation

## 2023-01-17 DIAGNOSIS — J479 Bronchiectasis, uncomplicated: Secondary | ICD-10-CM | POA: Diagnosis not present

## 2023-01-17 DIAGNOSIS — N400 Enlarged prostate without lower urinary tract symptoms: Secondary | ICD-10-CM | POA: Diagnosis not present

## 2023-01-17 DIAGNOSIS — I2510 Atherosclerotic heart disease of native coronary artery without angina pectoris: Secondary | ICD-10-CM | POA: Insufficient documentation

## 2023-01-21 ENCOUNTER — Ambulatory Visit: Payer: Medicare Other | Admitting: Interventional Cardiology

## 2023-08-14 ENCOUNTER — Ambulatory Visit: Attending: Cardiovascular Disease | Admitting: Cardiovascular Disease

## 2023-09-19 ENCOUNTER — Encounter: Payer: Self-pay | Admitting: Emergency Medicine

## 2023-09-19 ENCOUNTER — Ambulatory Visit: Admitting: Emergency Medicine

## 2023-09-19 ENCOUNTER — Ambulatory Visit: Payer: Self-pay | Admitting: Emergency Medicine

## 2023-09-19 ENCOUNTER — Ambulatory Visit: Payer: Medicare Other

## 2023-09-19 VITALS — BP 124/80 | HR 74 | Temp 97.8°F | Ht 67.0 in | Wt 165.0 lb

## 2023-09-19 VITALS — Ht 67.0 in | Wt 165.0 lb

## 2023-09-19 DIAGNOSIS — Z0001 Encounter for general adult medical examination with abnormal findings: Secondary | ICD-10-CM | POA: Diagnosis not present

## 2023-09-19 DIAGNOSIS — I700 Atherosclerosis of aorta: Secondary | ICD-10-CM

## 2023-09-19 DIAGNOSIS — Z13228 Encounter for screening for other metabolic disorders: Secondary | ICD-10-CM | POA: Diagnosis not present

## 2023-09-19 DIAGNOSIS — Z1329 Encounter for screening for other suspected endocrine disorder: Secondary | ICD-10-CM | POA: Diagnosis not present

## 2023-09-19 DIAGNOSIS — Z130 Encounter for screening for diseases of the blood and blood-forming organs and certain disorders involving the immune mechanism: Secondary | ICD-10-CM | POA: Diagnosis not present

## 2023-09-19 DIAGNOSIS — E785 Hyperlipidemia, unspecified: Secondary | ICD-10-CM

## 2023-09-19 DIAGNOSIS — C8208 Follicular lymphoma grade I, lymph nodes of multiple sites: Secondary | ICD-10-CM | POA: Diagnosis not present

## 2023-09-19 DIAGNOSIS — C3490 Malignant neoplasm of unspecified part of unspecified bronchus or lung: Secondary | ICD-10-CM

## 2023-09-19 DIAGNOSIS — I25118 Atherosclerotic heart disease of native coronary artery with other forms of angina pectoris: Secondary | ICD-10-CM

## 2023-09-19 DIAGNOSIS — C8100 Nodular lymphocyte predominant Hodgkin lymphoma, unspecified site: Secondary | ICD-10-CM

## 2023-09-19 DIAGNOSIS — I10 Essential (primary) hypertension: Secondary | ICD-10-CM | POA: Diagnosis not present

## 2023-09-19 DIAGNOSIS — Z0000 Encounter for general adult medical examination without abnormal findings: Secondary | ICD-10-CM | POA: Diagnosis not present

## 2023-09-19 DIAGNOSIS — N1832 Chronic kidney disease, stage 3b: Secondary | ICD-10-CM | POA: Insufficient documentation

## 2023-09-19 LAB — CBC WITH DIFFERENTIAL/PLATELET
Basophils Absolute: 0.1 K/uL (ref 0.0–0.1)
Basophils Relative: 1.1 % (ref 0.0–3.0)
Eosinophils Absolute: 0.2 K/uL (ref 0.0–0.7)
Eosinophils Relative: 4.2 % (ref 0.0–5.0)
HCT: 41.1 % (ref 39.0–52.0)
Hemoglobin: 13.6 g/dL (ref 13.0–17.0)
Lymphocytes Relative: 36.5 % (ref 12.0–46.0)
Lymphs Abs: 1.8 K/uL (ref 0.7–4.0)
MCHC: 33.1 g/dL (ref 30.0–36.0)
MCV: 87.3 fl (ref 78.0–100.0)
Monocytes Absolute: 0.8 K/uL (ref 0.1–1.0)
Monocytes Relative: 14.9 % — ABNORMAL HIGH (ref 3.0–12.0)
Neutro Abs: 2.2 K/uL (ref 1.4–7.7)
Neutrophils Relative %: 43.3 % (ref 43.0–77.0)
Platelets: 218 K/uL (ref 150.0–400.0)
RBC: 4.71 Mil/uL (ref 4.22–5.81)
RDW: 14.6 % (ref 11.5–15.5)
WBC: 5.1 K/uL (ref 4.0–10.5)

## 2023-09-19 LAB — COMPREHENSIVE METABOLIC PANEL WITH GFR
ALT: 16 U/L (ref 0–53)
AST: 23 U/L (ref 0–37)
Albumin: 4.2 g/dL (ref 3.5–5.2)
Alkaline Phosphatase: 103 U/L (ref 39–117)
BUN: 24 mg/dL — ABNORMAL HIGH (ref 6–23)
CO2: 26 meq/L (ref 19–32)
Calcium: 9.5 mg/dL (ref 8.4–10.5)
Chloride: 104 meq/L (ref 96–112)
Creatinine, Ser: 1.77 mg/dL — ABNORMAL HIGH (ref 0.40–1.50)
GFR: 35.31 mL/min — ABNORMAL LOW (ref >60.00)
Glucose, Bld: 81 mg/dL (ref 70–99)
Potassium: 4.5 meq/L (ref 3.5–5.1)
Sodium: 138 meq/L (ref 135–145)
Total Bilirubin: 0.3 mg/dL (ref 0.2–1.2)
Total Protein: 6.7 g/dL (ref 6.0–8.3)

## 2023-09-19 LAB — LIPID PANEL
Cholesterol: 212 mg/dL — ABNORMAL HIGH (ref 0–200)
HDL: 45.4 mg/dL (ref >39.00)
LDL Cholesterol: 144 mg/dL — ABNORMAL HIGH (ref 0–99)
NonHDL: 167
Total CHOL/HDL Ratio: 5
Triglycerides: 113 mg/dL (ref 0.0–149.0)
VLDL: 22.6 mg/dL (ref 0.0–40.0)

## 2023-09-19 LAB — HEMOGLOBIN A1C: Hgb A1c MFr Bld: 6.2 % (ref 4.6–6.5)

## 2023-09-19 NOTE — Assessment & Plan Note (Signed)
 Well-controlled hypertension off medications. Not taking losartan . Cardiovascular risks associated with hypertension discussed BP Readings from Last 3 Encounters:  09/19/23 124/80  01/17/23 130/69  12/06/22 (!) 143/81

## 2023-09-19 NOTE — Assessment & Plan Note (Signed)
 Stable.  No recent anginal episodes. History of stent placement. Continue daily baby aspirin .

## 2023-09-19 NOTE — Patient Instructions (Signed)
 Eric [name] , Thank you for taking time out of your busy schedule to complete your Annual Wellness Visit with me. I enjoyed our conversation and look forward to speaking with you again next year. I, as well as your care team,  appreciate your ongoing commitment to your health goals. Please review the following plan we discussed and let me know if I can assist you in the future. Your Game plan/ To Do List    Follow up Visits: Next Medicare AWV with our clinical staff: N/A.  Patient will call to get scheduled.  He would like Annual wellness to be scheduled on the same day as Medicare annual wellness visit.   Have you seen your provider in the last 6 months (3 months if uncontrolled diabetes)? Yes Next Office Visit with your provider: 09/19/2023.  Clinician Recommendations:  Aim for 30 minutes of exercise or brisk walking, 6-8 glasses of water, and 5 servings of fruits and vegetables each day. You are due for a Hep B vaccine and a Shingles vaccine.  Please discuss with Dr. Purcell during your next office visit.  Keep up the good work.      This is a list of the screening recommended for you and due dates:  Health Maintenance  Topic Date Due   Zoster (Shingles) Vaccine (1 of 2) Never done   Hepatitis B Vaccine (3 of 3 - 19+ 3-dose series) 06/22/2010   COVID-19 Vaccine (3 - Moderna risk series) 07/31/2019   Flu Shot  10/04/2023   Medicare Annual Wellness Visit  09/18/2024   DTaP/Tdap/Td vaccine (3 - Td or Tdap) 10/13/2031   Pneumococcal Vaccine for age over 68  Completed   HPV Vaccine  Aged Out   Meningitis B Vaccine  Aged Out    Advanced directives: (Declined) Advance directive discussed with you today. Even though you declined this today, please call our office should you change your mind, and we can give you the proper paperwork for you to fill out. Advance Care Planning is important because it:  [x]  Makes sure you receive the medical care that is consistent with your values, goals, and  preferences  [x]  It provides guidance to your family and loved ones and reduces their decisional burden about whether or not they are making the right decisions based on your wishes.  Follow the link provided in your after visit summary or read over the paperwork we have mailed to you to help you started getting your Advance Directives in place. If you need assistance in completing these, please reach out to us  so that we can help you!  See attachments for Preventive Care and Fall Prevention Tips.

## 2023-09-19 NOTE — Assessment & Plan Note (Signed)
 Stable.  Diet and nutrition discussed. Not taking rosuvastatin  Lipid profile done today

## 2023-09-19 NOTE — Patient Instructions (Signed)
 Health Maintenance, Male  Adopting a healthy lifestyle and getting preventive care are important in promoting health and wellness. Ask your health care provider about:  The right schedule for you to have regular tests and exams.  Things you can do on your own to prevent diseases and keep yourself healthy.  What should I know about diet, weight, and exercise?  Eat a healthy diet    Eat a diet that includes plenty of vegetables, fruits, low-fat dairy products, and lean protein.  Do not eat a lot of foods that are high in solid fats, added sugars, or sodium.  Maintain a healthy weight  Body mass index (BMI) is a measurement that can be used to identify possible weight problems. It estimates body fat based on height and weight. Your health care provider can help determine your BMI and help you achieve or maintain a healthy weight.  Get regular exercise  Get regular exercise. This is one of the most important things you can do for your health. Most adults should:  Exercise for at least 150 minutes each week. The exercise should increase your heart rate and make you sweat (moderate-intensity exercise).  Do strengthening exercises at least twice a week. This is in addition to the moderate-intensity exercise.  Spend less time sitting. Even light physical activity can be beneficial.  Watch cholesterol and blood lipids  Have your blood tested for lipids and cholesterol at 83 years of age, then have this test every 5 years.  You may need to have your cholesterol levels checked more often if:  Your lipid or cholesterol levels are high.  You are older than 83 years of age.  You are at high risk for heart disease.  What should I know about cancer screening?  Many types of cancers can be detected early and may often be prevented. Depending on your health history and family history, you may need to have cancer screening at various ages. This may include screening for:  Colorectal cancer.  Prostate cancer.  Skin cancer.  Lung  cancer.  What should I know about heart disease, diabetes, and high blood pressure?  Blood pressure and heart disease  High blood pressure causes heart disease and increases the risk of stroke. This is more likely to develop in people who have high blood pressure readings or are overweight.  Talk with your health care provider about your target blood pressure readings.  Have your blood pressure checked:  Every 3-5 years if you are 9-95 years of age.  Every year if you are 85 years old or older.  If you are between the ages of 29 and 29 and are a current or former smoker, ask your health care provider if you should have a one-time screening for abdominal aortic aneurysm (AAA).  Diabetes  Have regular diabetes screenings. This checks your fasting blood sugar level. Have the screening done:  Once every three years after age 23 if you are at a normal weight and have a low risk for diabetes.  More often and at a younger age if you are overweight or have a high risk for diabetes.  What should I know about preventing infection?  Hepatitis B  If you have a higher risk for hepatitis B, you should be screened for this virus. Talk with your health care provider to find out if you are at risk for hepatitis B infection.  Hepatitis C  Blood testing is recommended for:  Everyone born from [birth year] through 1965.  Anyone  with known risk factors for hepatitis C.  Sexually transmitted infections (STIs)  You should be screened each year for STIs, including gonorrhea and chlamydia, if:  You are sexually active and are younger than 83 years of age.  You are older than 83 years of age and your health care provider tells you that you are at risk for this type of infection.  Your sexual activity has changed since you were last screened, and you are at increased risk for chlamydia or gonorrhea. Ask your health care provider if you are at risk.  Ask your health care provider about whether you are at high risk for HIV. Your health care provider  may recommend a prescription medicine to help prevent HIV infection. If you choose to take medicine to prevent HIV, you should first get tested for HIV. You should then be tested every 3 months for as long as you are taking the medicine.  Follow these instructions at home:  Alcohol use  Do not drink alcohol if your health care provider tells you not to drink.  If you drink alcohol:  Limit how much you have to 0-2 drinks a day.  Know how much alcohol is in your drink. In the [place] one drink equals one 12 oz bottle of beer (355 mL), one 5 oz glass of wine (148 mL), or one 1 oz glass of hard liquor (44 mL).  Lifestyle  Do not use any products that contain nicotine or tobacco. These products include cigarettes, chewing tobacco, and vaping devices, such as e-cigarettes. If you need help quitting, ask your health care provider.  Do not use street drugs.  Do not share needles.  Ask your health care provider for help if you need support or information about quitting drugs.  General instructions  Schedule regular health, dental, and eye exams.  Stay current with your vaccines.  Tell your health care provider if:  You often feel depressed.  You have ever been abused or do not feel safe at home.  Summary  Adopting a healthy lifestyle and getting preventive care are important in promoting health and wellness.  Follow your health care provider's instructions about healthy diet, exercising, and getting tested or screened for diseases.  Follow your health care provider's instructions on monitoring your cholesterol and blood pressure.  This information is not intended to replace advice given to you by your health care provider. Make sure you discuss any questions you have with your health care provider.  Document Revised: 07/11/2020 Document Reviewed: 07/11/2020  Elsevier Patient Education  2024 [employer].

## 2023-09-19 NOTE — Progress Notes (Signed)
 Subjective:   Eric [name] is a 83 y.o. who presents for a Medicare Wellness preventive visit.  As a reminder, Annual Wellness Visits don't include a physical exam, and some assessments may be limited, especially if this visit is performed virtually. We may recommend an in-person follow-up visit with your provider if needed.  Visit Complete: Virtual I connected with  Eric [name] on 09/19/23 by a audio enabled telemedicine application and verified that I am speaking with the correct person using two identifiers.  Patient Location: Home  Provider Location: Office/Clinic  I discussed the limitations of evaluation and management by telemedicine. The patient expressed understanding and agreed to proceed.  Vital Signs: Because this visit was a virtual/telehealth visit, some criteria may be missing or patient reported. Any vitals not documented were not able to be obtained and vitals that have been documented are patient reported.  VideoDeclined- This patient declined Librarian, academic. Therefore the visit was completed with audio only.  Persons Participating in Visit: Patient.  AWV Questionnaire: No: Patient Medicare AWV questionnaire was not completed prior to this visit.  Cardiac Risk Factors include: advanced age (>51men, >52 women);hypertension     Objective:    There were no vitals filed for this visit. There is no height or weight on file to calculate BMI.     09/19/2023   11:18 AM 09/18/2022   10:43 AM 03/26/2022    9:46 AM 10/12/2021    3:41 PM 06/29/2021    8:51 AM 05/02/2020   11:50 AM 10/17/2017   12:07 PM  Advanced Directives  Does Patient Have a Medical Advance Directive? No No No No No No No   Does patient want to make changes to medical advance directive?  No - Guardian declined       Would patient like information on creating a medical advance directive? No - Patient declined  No - Patient declined No - Patient declined  No - Patient  declined      Data saved with a previous flowsheet row definition    Current Medications (verified) Outpatient Encounter Medications as of 09/19/2023  Medication Sig   aspirin  EC 81 MG tablet Take 81 mg by mouth at bedtime.   diphenhydrAMINE  (BENADRYL ) 25 MG tablet Take 25 mg by mouth at bedtime as needed for sleep.    ferrous sulfate  (FERROUSUL) 325 (65 FE) MG tablet Take 1 tablet (325 mg total) by mouth 2 (two) times daily with a meal.   omeprazole (PRILOSEC) 20 MG capsule Take 20 mg by mouth daily as needed (acid relfux).   Polyethylene Glycol 3350 (PEG 3350) POWD Take by mouth.   sildenafil  (VIAGRA ) 100 MG tablet Take 100 mg by mouth daily as needed.   losartan  (COZAAR ) 100 MG tablet Take 1 tablet (100 mg total) by mouth daily. (Patient not taking: Reported on 09/19/2023)   rosuvastatin  (CRESTOR ) 40 MG tablet Take 1 tablet (40 mg total) by mouth daily. (Patient not taking: Reported on 09/19/2023)   [DISCONTINUED] predniSONE  (DELTASONE ) 20 MG tablet Take 2 tablets (40 mg total) by mouth daily. (Patient not taking: Reported on 09/19/2023)   No facility-administered encounter medications on file as of 09/19/2023.    Allergies (verified) Neosporin [neomycin-bacitracin zn-polymyx]   History: Past Medical History:  Diagnosis Date   Cancer (HCC)    Coronary atherosclerosis of native coronary artery    a. s/p MI with DES/PCI to the RCA in 2013.   Encounter for antineoplastic chemotherapy 06/20/2015   Essential hypertension  GERD (gastroesophageal reflux disease)    History of heart attack    Hyperlipidemia    Ischemic cardiomyopathy    Myocardial infarction (HCC)    Non Hodgkin's lymphoma (HCC)    Plavix  resistance    Plavix  hyporesponder 2013   Sinus bradycardia    baseline HR 50s at times   Past Surgical History:  Procedure Laterality Date   CORONARY STENT PLACEMENT     LEFT HEART CATHETERIZATION WITH CORONARY ANGIOGRAM N/A 03/03/2012   Procedure: LEFT HEART CATHETERIZATION  WITH CORONARY ANGIOGRAM;  Surgeon: Oneil Parchment, MD;  Location: [facility] CATH LAB;  Service: Cardiovascular;  Laterality: N/A;   PERCUTANEOUS CORONARY STENT INTERVENTION (PCI-S) N/A 03/04/2012   Procedure: PERCUTANEOUS CORONARY STENT INTERVENTION (PCI-S);  Surgeon: Victory LELON Claudene DOUGLAS, MD;  Location: [facility] CATH LAB;  Service: Cardiovascular;  Laterality: N/A;   Family History  Problem Relation Age of Onset   Leukemia Mother    Social History   Socioeconomic History   Marital status: Married    Spouse name: Eric [name]   Number of children: Not on file   Years of education: Not on file   Highest education level: Not on file  Occupational History   Occupation: pharmacist/part time  Tobacco Use   Smoking status: Never   Smokeless tobacco: Never  Vaping Use   Vaping status: Never Used  Substance and Sexual Activity   Alcohol use: Yes    Comment: occasionally   Drug use: No   Sexual activity: Yes  Other Topics Concern   Not on file  Social History Narrative   Marital status: married since 30 years; from [place], [place] ; two hours from [place]; [place]      Children: 2 children (30, 27); no grandchildren      Lives: with wife, 2 children and 1 dog/2025      Employment: Teacher, early years/pre; independent Psychologist, 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       Tobacco: never      Alcohol:  Socially; almost none in 2017      Exercise in door track, cardio machine and treadmill 1-2 times per week      ADLs: independent with ADLs;       Advanced Directives: no; desires FULL CODE.    Social Drivers of Corporate investment banker Strain: Low Risk  (09/18/2022)   Overall Financial Resource Strain (CARDIA)    Difficulty of Paying Living Expenses: Not hard at all  Food Insecurity: No Food Insecurity (09/18/2022)   Hunger Vital Sign    Worried About Running Out of Food in the Last Year: Never true    Ran Out of Food in the Last Year: Never true  Transportation Needs: No Transportation Needs  (09/18/2022)   PRAPARE - Administrator, Civil Service (Medical): No    Lack of Transportation (Non-Medical): No  Physical Activity: Insufficiently Active (09/19/2023)   Exercise Vital Sign    Days of Exercise per Week: 6 days    Minutes of Exercise per Session: 20 min  Stress: No Stress Concern Present (09/18/2022)   [employer] of Occupational Health - Occupational Stress Questionnaire    Feeling of Stress : Not at all  Social Connections: Unknown (09/19/2023)   Social Connection and Isolation Panel    Frequency of Communication with Friends and Family: More than three times a week    Frequency of Social Gatherings with Friends and Family: Not on file    Attends Religious Services: Not on file  Active Member of Clubs or Organizations: Not on file    Attends Banker Meetings: Not on file    Marital Status: Married    Tobacco Counseling Counseling given: Not Answered    Clinical Intake:  Pre-visit preparation completed: Yes  Pain : No/denies pain     Diabetes: No  Lab Results  Component Value Date   HGBA1C 6.2 10/31/2021   HGBA1C 5.5 02/29/2016     How often do you need to have someone help you when you read instructions, pamphlets, or other written materials from your doctor or pharmacy?: 1 - Never  Interpreter Needed?: No  Information entered by :: Bobbie Valletta, RMA   Activities of Daily Living     09/19/2023   11:14 AM  In your present state of health, do you have any difficulty performing the following activities:  Hearing? 1  Comment Need new hearing aides  Vision? 0  Difficulty concentrating or making decisions? 0  Walking or climbing stairs? 0  Dressing or bathing? 0  Doing errands, shopping? 0  Preparing Food and eating ? N  Using the Toilet? N  In the past six months, have you accidently leaked urine? N  Do you have problems with loss of bowel control? N  Managing your Medications? N  Managing your Finances? N   Housekeeping or managing your Housekeeping? N    Patient Care Team: Purcell Emil Schanz, MD as PCP - General (Internal Medicine) Dann Candyce RAMAN, MD as PCP - Cardiology (Cardiology)  I have updated your Care Teams any recent Medical Services you may have received from other providers in the past year.     Assessment:   This is a routine wellness examination for Eric [name].  Hearing/Vision screen Hearing Screening - Comments:: Need new hearing aides Vision Screening - Comments:: Per pt-need glasses for reading   Goals Addressed   None    Depression Screen     09/19/2023   10:07 AM 09/18/2022   10:45 AM 10/31/2021   10:32 AM 10/06/2021   11:20 AM 06/29/2021    9:56 AM 12/29/2020    3:17 PM 10/15/2019   11:37 AM  PHQ 2/9 Scores  PHQ - 2 Score 0 0 0 0 0 0 0  PHQ- 9 Score  3  0       Fall Risk     09/19/2023   11:19 AM 09/19/2023   10:07 AM 09/18/2022   10:28 AM 10/31/2021   10:32 AM 10/06/2021   11:22 AM  Fall Risk   Falls in the past year? 0 0 0 0 0  Number falls in past yr:  0  0 0  Injury with Fall?  0  0 0  Risk for fall due to :  No Fall Risks No Fall Risks No Fall Risks No Fall Risks  Follow up Falls evaluation completed;Falls prevention discussed Falls evaluation completed  Falls evaluation completed       Data saved with a previous flowsheet row definition    MEDICARE RISK AT HOME:  Medicare Risk at Home Any stairs in or around the home?: Yes If so, are there any without handrails?: No Home free of loose throw rugs in walkways, pet beds, electrical cords, etc?: Yes Adequate lighting in your home to reduce risk of falls?: Yes Life alert?: No Use of a cane, walker or w/c?: No Grab bars in the bathroom?: No Shower chair or bench in shower?: No Elevated toilet seat or a handicapped toilet?: No  TIMED UP AND GO:  Was the test performed?  No  Cognitive Function: Declined/Normal: No cognitive concerns noted by patient or family. Patient alert, oriented, able  to answer questions appropriately and recall recent events. No signs of memory loss or confusion.        09/18/2022   10:29 AM 10/06/2021   11:22 AM  6CIT Screen  What Year? 0 points 0 points  What month? 0 points 0 points  What time? 0 points 0 points  Count back from 20 0 points 0 points  Months in reverse 0 points 0 points  Repeat phrase 2 points 0 points  Total Score 2 points 0 points    Immunizations Immunization History  Administered Date(s) Administered   Fluad Quad(high Dose 65+) 03/03/2019, 12/29/2020   Hepatitis B 10/06/2009, 04/27/2010   Hepatitis B, PED/ADOLESCENT 10/06/2009, 04/27/2010   Influenza,inj,Quad PF,6+ Mos 02/09/2014, 01/05/2015, 02/16/2016   Influenza-Unspecified 02/09/2014, 01/05/2015, 02/16/2016   Moderna Sars-Covid-2 Vaccination 06/04/2019, 07/03/2019   Pneumococcal Conjugate-13 02/29/2016   Pneumococcal Polysaccharide-23 03/03/2019   Tdap 03/03/2019, 10/12/2021    Screening Tests Health Maintenance  Topic Date Due   Zoster Vaccines- Shingrix (1 of 2) Never done   Hepatitis B Vaccines (3 of 3 - 19+ 3-dose series) 06/22/2010   COVID-19 Vaccine (3 - Moderna risk series) 07/31/2019   INFLUENZA VACCINE  10/04/2023   Medicare Annual Wellness (AWV)  09/18/2024   DTaP/Tdap/Td (3 - Td or Tdap) 10/13/2031   Pneumococcal Vaccine: 50+ Years  Completed   HPV VACCINES  Aged Out   Meningococcal B Vaccine  Aged Out    Health Maintenance  Health Maintenance Due  Topic Date Due   Zoster Vaccines- Shingrix (1 of 2) Never done   Hepatitis B Vaccines (3 of 3 - 19+ 3-dose series) 06/22/2010   COVID-19 Vaccine (3 - Moderna risk series) 07/31/2019   Health Maintenance Items Addressed: See Nurse Notes at the end of this note  Additional Screening:  Vision Screening: Recommended annual ophthalmology exams for early detection of glaucoma and other disorders of the eye. Would you like a referral to an eye doctor? Yes    Dental Screening: Recommended annual  dental exams for proper oral hygiene  Community Resource Referral / Chronic Care Management: CRR required this visit?  No   CCM required this visit?  No   Plan:    I have personally reviewed and noted the following in the patient's chart:   Medical and social history Use of alcohol, tobacco or illicit drugs  Current medications and supplements including opioid prescriptions. Patient is not currently taking opioid prescriptions. Functional ability and status Nutritional status Physical activity Advanced directives List of other physicians Hospitalizations, surgeries, and ER visits in previous 12 months Vitals Screenings to include cognitive, depression, and falls Referrals and appointments  In addition, I have reviewed and discussed with patient certain preventive protocols, quality metrics, and best practice recommendations. A written personalized care plan for preventive services as well as general preventive health recommendations were provided to patient.   Eric [name], CMA   09/19/2023   After Visit Summary: (MyChart) Due to this being a telephonic visit, the after visit summary with patients personalized plan was offered to patient via MyChart   Notes: Patient is due for a Hep B vaccine and a Shingrix vaccine.  He would like to discuss during his next office visit with provider.  Patient would like a recommendation for an eye doctor during next office visit as well.  He  stated that it has been too long since his last eye exam.

## 2023-09-19 NOTE — Assessment & Plan Note (Signed)
 Stable.  Follows up with Dr. Gatha
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
## 2023-09-19 NOTE — Assessment & Plan Note (Signed)
 Diet and nutrition discussed Not taking rosuvastatin  Lipid profile done today

## 2023-09-19 NOTE — Assessment & Plan Note (Signed)
 Stable.  No concerns.  Sees oncologist on a regular basis

## 2023-09-19 NOTE — Assessment & Plan Note (Signed)
Stable.  Dr. Lew Dawes office visit notes reviewed today. ?

## 2023-10-07 DIAGNOSIS — I2510 Atherosclerotic heart disease of native coronary artery without angina pectoris: Secondary | ICD-10-CM | POA: Diagnosis not present

## 2023-10-07 DIAGNOSIS — N390 Urinary tract infection, site not specified: Secondary | ICD-10-CM | POA: Diagnosis not present

## 2023-10-07 DIAGNOSIS — I129 Hypertensive chronic kidney disease with stage 1 through stage 4 chronic kidney disease, or unspecified chronic kidney disease: Secondary | ICD-10-CM | POA: Diagnosis not present

## 2023-10-07 DIAGNOSIS — N1832 Chronic kidney disease, stage 3b: Secondary | ICD-10-CM | POA: Diagnosis not present

## 2023-10-07 DIAGNOSIS — E785 Hyperlipidemia, unspecified: Secondary | ICD-10-CM | POA: Diagnosis not present

## 2024-02-06 DIAGNOSIS — H5213 Myopia, bilateral: Secondary | ICD-10-CM | POA: Diagnosis not present

## 2024-03-27 ENCOUNTER — Ambulatory Visit: Admission: EM | Admit: 2024-03-27 | Discharge: 2024-03-27 | Disposition: A | Discharge status: Home / Self Care

## 2024-03-27 ENCOUNTER — Other Ambulatory Visit: Payer: Self-pay

## 2024-03-27 DIAGNOSIS — K4090 Unilateral inguinal hernia, without obstruction or gangrene, not specified as recurrent: Secondary | ICD-10-CM | POA: Diagnosis not present

## 2024-03-27 DIAGNOSIS — N5089 Other specified disorders of the male genital organs: Secondary | ICD-10-CM

## 2024-03-27 NOTE — Discharge Instructions (Addendum)
 You were seen today for concerns of testicular swelling on the right side.  Based on your exam I suspect that you may have something called an inguinal hernia and the contents have moved into your right testicle leading to the swelling and intermittent pain.  At this time I recommend you follow-up with your primary care provider as you will likely need an evaluation with ultrasound and a referral to surgery to have this corrected.  If you have any of the following symptoms please go to the ER: Severe swelling or redness of the testicle, severe abdominal pain, fever or chills, difficulty having a bowel movement, nausea or vomiting as these could be signs of a strangulated hernia or incarceration which are a medical emergency.

## 2024-03-27 NOTE — ED Triage Notes (Signed)
 Pt presents with a chief complaint of right testicle swelling x 4-5 days. Currently rates overall pain in R testicle a 2/10. Pain is intermittent, has improved over the last few days. No OTC medications taken PTA for symptoms/pain reported. States he is needing to follow up with his urologist soon. Denies urinary symptoms.

## 2024-03-27 NOTE — ED Provider Notes (Signed)
 " [facility]    CSN: [id number] Arrival date & time: 03/27/24  1407      History   Chief Complaint Chief Complaint  Patient presents with   Groin Swelling    HPI Eric [name] is a 84 y.o. male.   HPI  Pt is here today with concerns for right testicle swelling and slight pain. He reports the swelling started about 5 days ago but does not seem to be getting gradually larger in size. He denies pain with urination, abnormal urine coloration, penile discharge, penile pain or swelling, fever or chills. He denies previous similar incidents but states he did have prostate surgery about 2 years ago.     Past Medical History:  Diagnosis Date   Cancer [facility])    Coronary atherosclerosis of native coronary artery    a. s/p MI with DES/PCI to the RCA in 2013.   Encounter for antineoplastic chemotherapy 06/20/2015   Essential hypertension    GERD (gastroesophageal reflux disease)    History of heart attack    Hyperlipidemia    Ischemic cardiomyopathy    Myocardial infarction (HCC)    Non Hodgkin's lymphoma (HCC)    Plavix  resistance    Plavix  hyporesponder 2013   Sinus bradycardia    baseline HR 50s at times    Patient Active Problem List   Diagnosis Date Noted   Malignant neoplasm of unspecified part of unspecified bronchus or lung (HCC) 09/19/2023   Stage 3b chronic kidney disease (HCC) 09/19/2023   History of lymphoma 10/31/2021   Dyslipidemia 06/29/2021   Diverticulosis 12/29/2020   Atherosclerosis of aorta 12/29/2020   Essential hypertension 12/29/2020   Hypercholesterolemia 03/25/2016   Follicular lymphoma grade I of lymph nodes of multiple sites (HCC) 02/16/2015   Coronary artery disease of native artery of native heart with stable angina pectoris 08/18/2014   Hearing loss 07/11/2012   Insomnia, idiopathic 07/11/2012   Erectile dysfunction 07/11/2012   Lymphoma (HCC) 06/05/2011    Class: Chronic    Past Surgical History:  Procedure Laterality Date    CORONARY STENT PLACEMENT     LEFT HEART CATHETERIZATION WITH CORONARY ANGIOGRAM N/A 03/03/2012   Procedure: LEFT HEART CATHETERIZATION WITH CORONARY ANGIOGRAM;  Surgeon: Oneil Parchment, MD;  Location: [facility] CATH LAB;  Service: Cardiovascular;  Laterality: N/A;   PERCUTANEOUS CORONARY STENT INTERVENTION (PCI-S) N/A 03/04/2012   Procedure: PERCUTANEOUS CORONARY STENT INTERVENTION (PCI-S);  Surgeon: Victory LELON Claudene DOUGLAS, MD;  Location: [facility] CATH LAB;  Service: Cardiovascular;  Laterality: N/A;       Home Medications    Prior to Admission medications  Medication Sig Start Date End Date Taking? Authorizing Provider  aspirin  EC 81 MG tablet Take 81 mg by mouth at bedtime.    [provider]  diphenhydrAMINE  (BENADRYL ) 25 MG tablet Take 25 mg by mouth at bedtime as needed for sleep.     [provider]  ferrous sulfate  (FERROUSUL) 325 (65 FE) MG tablet Take 1 tablet (325 mg total) by mouth 2 (two) times daily with a meal. 08/23/20   Weaver, Glendia T, PA-C  losartan  (COZAAR ) 100 MG tablet Take 1 tablet (100 mg total) by mouth daily. Patient not taking: Reported on 09/19/2023 05/07/22   Dann Candyce RAMAN, MD  omeprazole (PRILOSEC) 20 MG capsule Take 20 mg by mouth daily as needed (acid relfux).    [provider]  Polyethylene Glycol 3350 (PEG 3350) POWD Take by mouth. 02/11/18   [provider]  rosuvastatin  (CRESTOR ) 40 MG  tablet Take 1 tablet (40 mg total) by mouth daily. Patient not taking: Reported on 09/19/2023 07/19/22   Dann Candyce RAMAN, MD  sildenafil  (VIAGRA ) 100 MG tablet Take 100 mg by mouth daily as needed. 12/10/17   [provider]    Family History Family History  Problem Relation Age of Onset   Leukemia Mother     Social History Social History[1]   Allergies   Neosporin [neomycin-bacitracin zn-polymyx]   Review of Systems Review of Systems  Constitutional:  Negative for chills and fever.  Genitourinary:  Positive for scrotal  swelling and testicular pain. Negative for difficulty urinating, dysuria, flank pain, frequency, hematuria, penile discharge, penile pain and penile swelling.     Physical Exam Triage Vital Signs ED Triage Vitals  Encounter Vitals Group     BP 03/27/24 1428 (!) 145/85     Girls Systolic BP Percentile --      Girls Diastolic BP Percentile --      Boys Systolic BP Percentile --      Boys Diastolic BP Percentile --      Pulse Rate 03/27/24 1428 (!) 109     Resp 03/27/24 1428 15     Temp 03/27/24 1428 (!) 97.5 F (36.4 C)     Temp Source 03/27/24 1428 Oral     SpO2 03/27/24 1428 97 %     Weight --      Height --      Head Circumference --      Peak Flow --      Pain Score 03/27/24 1438 2     Pain Loc --      Pain Education --      Exclude from Growth Chart --    No data found.  Updated Vital Signs BP (!) 157/90 (BP Location: Right Arm) Comment: states he has not taken his BP medicines today or yesterday.  Pulse 69   Temp (!) 97.5 F (36.4 C) (Oral)   Resp 15   SpO2 99%   Visual Acuity Right Eye Distance:   Left Eye Distance:   Bilateral Distance:    Right Eye Near:   Left Eye Near:    Bilateral Near:     Physical Exam Vitals reviewed.  Constitutional:      General: He is awake.     Appearance: Normal appearance. He is well-developed and well-groomed.  HENT:     Head: Normocephalic and atraumatic.  Eyes:     Extraocular Movements: Extraocular movements intact.     Conjunctiva/sclera: Conjunctivae normal.  Pulmonary:     Effort: Pulmonary effort is normal.  Abdominal:     Hernia: A hernia is present. Hernia is present in the right inguinal area.  Genitourinary:    Penis: Normal.      Testes:        Right: Mass present. Tenderness or swelling not present. Right testis is descended.     Epididymis:     Right: Normal.     Comments: Pt declines chaperone  Right testicle is rather full and enlarged compared to the left side. Fullness extends to inguinal  crease and up into abdomen. Fullness is more notable with coughing.   Musculoskeletal:     Cervical back: Normal range of motion.  Neurological:     Mental Status: He is alert and oriented to person, place, and time.  Psychiatric:        Attention and Perception: Attention normal.        Mood and  Affect: Mood normal.        Speech: Speech normal.        Behavior: Behavior normal. Behavior is cooperative.      UC Treatments / Results  Labs (all labs ordered are listed, but only abnormal results are displayed) Labs Reviewed - No data to display  EKG   Radiology No results found.  Procedures Procedures (including critical care time)  Medications Ordered in UC Medications - No data to display  Initial Impression / Assessment and Plan / UC Course  I have reviewed the triage vital signs and the nursing notes.  Pertinent labs & imaging results that were available during my care of the patient were reviewed by me and considered in my medical decision making (see chart for details).      Final Clinical Impressions(s) / UC Diagnoses   Final diagnoses:  Testicular swelling, right  Unilateral inguinal hernia without obstruction or gangrene, recurrence not specified   Patient is here today with concerns of right testicular swelling and mild, intermittent pain that has been ongoing for the last 3 to 4 days.  He denies dysuria, hematuria, difficulty urinating, penile discharge, increased warmth of the affected area.  Physical exam does reveal a full right scrotum compared to the left and there does appear to be excess mass compared to the left.  There does appear to be a right inguinal hernia with contents connecting to the right scrotum.  Since patient does not have fever, GI symptoms, severe pain or discoloration of the area I do not suspect incarceration or strangulation at this time.  I did review signs and symptoms of this as well as appropriate disposition, going to the ER with  the patient.  At this time I recommend that he follows up with his PCP in the next week for appropriate imaging evaluation and likely referral to general surgery for further management.  Patient is in agreement with management plan at this time.  ED and return precautions reviewed and provided in AVS.  Follow-up as needed    Discharge Instructions      You were seen today for concerns of testicular swelling on the right side.  Based on your exam I suspect that you may have something called an inguinal hernia and the contents have moved into your right testicle leading to the swelling and intermittent pain.  At this time I recommend you follow-up with your primary care provider as you will likely need an evaluation with ultrasound and a referral to surgery to have this corrected.  If you have any of the following symptoms please go to the ER: Severe swelling or redness of the testicle, severe abdominal pain, fever or chills, difficulty having a bowel movement, nausea or vomiting as these could be signs of a strangulated hernia or incarceration which are a medical emergency.     ED Prescriptions   None    PDMP not reviewed this encounter.     [1]  Social History Tobacco Use   Smoking status: Never   Smokeless tobacco: Never  Vaping Use   Vaping status: Never Used  Substance Use Topics   Alcohol use: Yes    Comment: occasionally   Drug use: No     Jessicia Napolitano, Rocky BRAVO, PA-C 03/27/24 2001  "

## 2024-04-08 LAB — LAB REPORT - SCANNED: EGFR: 42

## 2024-04-10 ENCOUNTER — Other Ambulatory Visit: Payer: Self-pay | Admitting: Interventional Cardiology
